# Patient Record
Sex: Male | Born: 1950 | Race: White | Hispanic: No | Marital: Married | Smoking: Never smoker
Health system: Southern US, Community
[De-identification: ages and names within clinical notes are randomized; demographics above are authoritative.]

## PROBLEM LIST (undated history)

## (undated) DIAGNOSIS — G4730 Sleep apnea, unspecified: Secondary | ICD-10-CM

## (undated) DIAGNOSIS — I10 Essential (primary) hypertension: Secondary | ICD-10-CM

## (undated) DIAGNOSIS — R0602 Shortness of breath: Secondary | ICD-10-CM

## (undated) DIAGNOSIS — M1990 Unspecified osteoarthritis, unspecified site: Secondary | ICD-10-CM

## (undated) DIAGNOSIS — Z86711 Personal history of pulmonary embolism: Secondary | ICD-10-CM

## (undated) HISTORY — PX: BACK SURGERY: SHX140

---

## 1998-06-22 ENCOUNTER — Encounter: Admission: RE | Admit: 1998-06-22 | Discharge: 1998-09-20 | Payer: Self-pay | Admitting: *Deleted

## 2000-08-02 ENCOUNTER — Emergency Department (HOSPITAL_COMMUNITY): Admission: EM | Admit: 2000-08-02 | Discharge: 2000-08-02 | Payer: Self-pay | Admitting: Emergency Medicine

## 2000-08-02 ENCOUNTER — Encounter: Payer: Self-pay | Admitting: Emergency Medicine

## 2000-08-05 ENCOUNTER — Ambulatory Visit (HOSPITAL_COMMUNITY): Admission: RE | Admit: 2000-08-05 | Discharge: 2000-08-05 | Payer: Self-pay | Admitting: Neurosurgery

## 2000-08-05 ENCOUNTER — Encounter: Payer: Self-pay | Admitting: Neurosurgery

## 2000-08-12 ENCOUNTER — Encounter: Payer: Self-pay | Admitting: Neurosurgery

## 2000-08-12 ENCOUNTER — Ambulatory Visit (HOSPITAL_COMMUNITY): Admission: RE | Admit: 2000-08-12 | Discharge: 2000-08-12 | Payer: Self-pay | Admitting: Neurosurgery

## 2001-02-02 ENCOUNTER — Ambulatory Visit (HOSPITAL_COMMUNITY): Admission: RE | Admit: 2001-02-02 | Discharge: 2001-02-02 | Payer: Self-pay | Admitting: Internal Medicine

## 2001-02-17 ENCOUNTER — Encounter (INDEPENDENT_AMBULATORY_CARE_PROVIDER_SITE_OTHER): Payer: Self-pay | Admitting: Specialist

## 2001-02-17 ENCOUNTER — Ambulatory Visit (HOSPITAL_BASED_OUTPATIENT_CLINIC_OR_DEPARTMENT_OTHER): Admission: RE | Admit: 2001-02-17 | Discharge: 2001-02-17 | Payer: Self-pay | Admitting: Orthopedic Surgery

## 2003-05-12 ENCOUNTER — Encounter: Payer: Self-pay | Admitting: Cardiology

## 2003-05-12 ENCOUNTER — Ambulatory Visit (HOSPITAL_COMMUNITY): Admission: RE | Admit: 2003-05-12 | Discharge: 2003-05-12 | Payer: Self-pay | Admitting: Cardiology

## 2003-06-02 ENCOUNTER — Ambulatory Visit (HOSPITAL_COMMUNITY): Admission: RE | Admit: 2003-06-02 | Discharge: 2003-06-03 | Payer: Self-pay | Admitting: Orthopedic Surgery

## 2003-06-29 ENCOUNTER — Ambulatory Visit (HOSPITAL_COMMUNITY): Admission: RE | Admit: 2003-06-29 | Discharge: 2003-06-29 | Payer: Self-pay | Admitting: Orthopedic Surgery

## 2003-07-19 ENCOUNTER — Encounter: Admission: RE | Admit: 2003-07-19 | Discharge: 2003-07-19 | Payer: Self-pay | Admitting: Cardiology

## 2004-05-30 ENCOUNTER — Ambulatory Visit (HOSPITAL_COMMUNITY): Admission: RE | Admit: 2004-05-30 | Discharge: 2004-05-30 | Payer: Self-pay | Admitting: Orthopedic Surgery

## 2004-05-30 ENCOUNTER — Ambulatory Visit (HOSPITAL_BASED_OUTPATIENT_CLINIC_OR_DEPARTMENT_OTHER): Admission: RE | Admit: 2004-05-30 | Discharge: 2004-05-30 | Payer: Self-pay | Admitting: Orthopedic Surgery

## 2007-04-13 ENCOUNTER — Ambulatory Visit: Payer: Self-pay | Admitting: Internal Medicine

## 2007-08-26 DIAGNOSIS — Z86711 Personal history of pulmonary embolism: Secondary | ICD-10-CM

## 2007-08-26 HISTORY — PX: COLONOSCOPY: SHX174

## 2007-08-26 HISTORY — DX: Personal history of pulmonary embolism: Z86.711

## 2008-03-24 ENCOUNTER — Ambulatory Visit: Payer: Self-pay | Admitting: Gastroenterology

## 2008-04-12 ENCOUNTER — Ambulatory Visit: Payer: Self-pay | Admitting: Gastroenterology

## 2008-04-12 ENCOUNTER — Encounter: Payer: Self-pay | Admitting: Gastroenterology

## 2008-04-12 ENCOUNTER — Ambulatory Visit: Payer: Self-pay | Admitting: Internal Medicine

## 2008-04-12 DIAGNOSIS — G4733 Obstructive sleep apnea (adult) (pediatric): Secondary | ICD-10-CM | POA: Insufficient documentation

## 2008-04-12 DIAGNOSIS — E785 Hyperlipidemia, unspecified: Secondary | ICD-10-CM | POA: Insufficient documentation

## 2008-04-12 DIAGNOSIS — E119 Type 2 diabetes mellitus without complications: Secondary | ICD-10-CM | POA: Insufficient documentation

## 2008-04-12 DIAGNOSIS — I10 Essential (primary) hypertension: Secondary | ICD-10-CM | POA: Insufficient documentation

## 2008-04-17 ENCOUNTER — Encounter: Payer: Self-pay | Admitting: Gastroenterology

## 2008-06-14 ENCOUNTER — Telehealth: Payer: Self-pay | Admitting: Internal Medicine

## 2008-07-02 ENCOUNTER — Encounter: Payer: Self-pay | Admitting: Internal Medicine

## 2008-08-25 HISTORY — PX: ELBOW ARTHROSCOPY: SUR87

## 2009-05-18 ENCOUNTER — Inpatient Hospital Stay (HOSPITAL_COMMUNITY): Admission: EM | Admit: 2009-05-18 | Discharge: 2009-05-21 | Payer: Self-pay | Admitting: Emergency Medicine

## 2010-06-12 ENCOUNTER — Ambulatory Visit: Payer: Self-pay | Admitting: Vascular Surgery

## 2010-11-29 LAB — CBC
HCT: 36 % — ABNORMAL LOW (ref 39.0–52.0)
Hemoglobin: 12.3 g/dL — ABNORMAL LOW (ref 13.0–17.0)
Hemoglobin: 14.4 g/dL (ref 13.0–17.0)
MCHC: 34.1 g/dL (ref 30.0–36.0)
MCHC: 34.2 g/dL (ref 30.0–36.0)
MCV: 88.6 fL (ref 78.0–100.0)
RDW: 13.8 % (ref 11.5–15.5)
RDW: 14 % (ref 11.5–15.5)

## 2010-11-29 LAB — BASIC METABOLIC PANEL
BUN: 9 mg/dL (ref 6–23)
Chloride: 105 mEq/L (ref 96–112)
Creatinine, Ser: 0.82 mg/dL (ref 0.4–1.5)
GFR calc Af Amer: >60 mL/min (ref >60)
GFR calc non Af Amer: >60 mL/min (ref >60)
Potassium: 3.9 mEq/L (ref 3.5–5.1)

## 2010-11-29 LAB — COMPREHENSIVE METABOLIC PANEL
ALT: 45 U/L (ref 0–53)
Calcium: 9.3 mg/dL (ref 8.4–10.5)
Creatinine, Ser: 1.05 mg/dL (ref 0.4–1.5)
Glucose, Bld: 255 mg/dL — ABNORMAL HIGH (ref 70–99)
Sodium: 138 mEq/L (ref 135–145)
Total Protein: 6.4 g/dL (ref 6.0–8.3)

## 2010-11-29 LAB — GLUCOSE, CAPILLARY
Glucose-Capillary: 178 mg/dL — ABNORMAL HIGH (ref 70–99)
Glucose-Capillary: 196 mg/dL — ABNORMAL HIGH (ref 70–99)
Glucose-Capillary: 206 mg/dL — ABNORMAL HIGH (ref 70–99)
Glucose-Capillary: 206 mg/dL — ABNORMAL HIGH (ref 70–99)
Glucose-Capillary: 206 mg/dL — ABNORMAL HIGH (ref 70–99)
Glucose-Capillary: 225 mg/dL — ABNORMAL HIGH (ref 70–99)
Glucose-Capillary: 235 mg/dL — ABNORMAL HIGH (ref 70–99)
Glucose-Capillary: 248 mg/dL — ABNORMAL HIGH (ref 70–99)
Glucose-Capillary: 253 mg/dL — ABNORMAL HIGH (ref 70–99)

## 2010-11-29 LAB — DIFFERENTIAL
Lymphocytes Relative: 5 % — ABNORMAL LOW (ref 12–46)
Lymphs Abs: 0.8 10*3/uL (ref 0.7–4.0)
Monocytes Relative: 4 % (ref 3–12)
Neutro Abs: 14 10*3/uL — ABNORMAL HIGH (ref 1.7–7.7)
Neutrophils Relative %: 90 % — ABNORMAL HIGH (ref 43–77)

## 2010-11-29 LAB — SAMPLE TO BLOOD BANK

## 2010-11-29 LAB — PROTIME-INR
INR: 1 (ref 0.00–1.49)
Prothrombin Time: 13.3 seconds (ref 11.6–15.2)

## 2010-11-29 LAB — APTT: aPTT: 21 seconds — ABNORMAL LOW (ref 24–37)

## 2011-01-07 NOTE — Assessment & Plan Note (Signed)
[facility]                             PULMONARY OFFICE NOTE   NAME:Anthony Case, Anthony Case                   MRN:          [id number]  DATE:04/13/2007                            DOB:          [birth year]    PROBLEM:  A 60 year old man with sleep apnea.   HISTORY:  This gentleman was diagnosed by me with sleep apnea over 15  years ago.  He had been using CPAP with his original machine set at 7 or  7.5 CWP, and felt he had done well with this.  He had used a nasal mask  and no humidifier.  That machine finally failed, and he has used a  Industrial/product designer with auto titration from [facility] pending this  visit to reestablish.  He is now using a nasal pillows mask.  Weight has  gone up some in the last year.  He has not been told that he snores  through his CPAP, and he recognizes significant sleepiness only if he  tries to skip it.   MEDICATIONS:  1. Glipizide 5 mg.  2. Actos 30 mg.  3. Diovan 80 mg.  4. Lipitor 10 mg.  5. Metformin 500 mg.  6. Aspirin 325 mg.  7. CPAP.   No medication allergy.   REVIEW OF SYSTEMS:  Weight gain.  He denies significant sleepiness,  headache, palpitation, shortness of breath, nasal congestion or unusual  behavior associated with sleep.   PAST HISTORY:  1. Hypertension.  2. Tonsillectomy.  3. Diabetes.  4. Sleep apnea.  5. No history of heart or lung disease.   SOCIAL HISTORY:  Married with children, never smoked, 2-3 glasses of  wine.  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   FAMILY HISTORY:  Heart disease.   OBJECTIVE:  Weight 341 pounds, BP 112/60, pulse 80, room air saturation  94%.  He is an overweight, alert, tall gentleman who appears comfortable.  HEENT:  Palate spacing 3-4/4.  Voice quality is normal, there is no  stridor.  Nasal airway is unobstructed.  There is no neck vein distension or thyromegaly.  CHEST:  Quiet, clear.  LUNG FIELDS:   Unlabored breathing.  Heart sounds regular without murmur or gallop.  EXTREMITIES:  Without cyanosis, clubbing, tremor, or edema.  NEUROLOGIC:  Unremarkable to observation.   IMPRESSION:  Obstructive sleep apnea with remote diagnosis,  symptomatically well controlled in the past, but now with some question  as he has gained weight and his old machine has failed.   PLAN:  Auto titration download was done with suggested pressure 10 CWP.  [facility] is going to change his pressure to 10 CWP, and we will  schedule return in a month, earlier p.r.n.  The basics of sleep hygiene  and management of sleep apnea were reviewed today.     Clinton D. Maple Hudson, MD, Tonny Bollman, FACP  Electronically Signed    CDY/MedQ  DD: 04/13/2007  DT: 04/14/2007  Job #: [id number]   cc:   Barry Dienes. Eloise Harman, M.D.

## 2011-01-07 NOTE — Procedures (Signed)
DUPLEX DEEP VENOUS EXAM - LOWER EXTREMITY   INDICATION:  Left lower extremity pain.   HISTORY:  Edema:  Bilateral lower extremities.  Trauma/Surgery:  Bee sting in September of 2011 at the left lower leg  region.  Pain:  Left lower leg region.  PE:  Yes, 2 years ago.  Previous DVT:  The patient states he had a left calf DVT 2 years ago.  Anticoagulants:  Other:   DUPLEX EXAM:                CFV   SFV   PopV  PTV    GSV                R  L  R  L  R  L  R   L  R  L  Thrombosis    o  o     o     o      +     O  Spontaneous   +  +     +     +      o     +  Phasic        +  +     +     +      0     +  Augmentation  +  +     +     +      o     +  Compressible  +  +     +     +      o     +  Competent   Legend:  + - yes  o - no  p - partial  D - decreased   IMPRESSION:  1. No evidence of recent deep or superficial vein thrombosis noted in      the left lower extremity.  2. 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    _____________________________  Quita Skye Hart Rochester, M.D.   CH/MEDQ  D:  06/14/2010  T:  06/14/2010  Job:  [id number]

## 2011-01-10 NOTE — Op Note (Signed)
[facility]  Patient:    Anthony Case, Anthony Case                     MRN: [id number] Proc. Date: 02/17/01 Attending:  Nicki Reaper, M.D.                           Operative Report  PREOPERATIVE DIAGNOSIS:  Mucoid cyst, left middle finger.  POSTOPERATIVE DIAGNOSIS:  Mucoid cyst, left middle finger.  OPERATION:  Excision of cyst, debridement of distal interphalangeal joint, left middle finger.  SURGEON:  Nicki Reaper, M.D.  ASSISTANT:  R.N.  ANESTHESIA:  Forearm-based IV regional.  ANESTHESIOLOGIST:  Janetta Hora. Gelene Mink, M.D.  HISTORY:  The patient is a 60 year old male with a history of a mass deformity of his nail of the left middle finger.  X-rays revealed arthritic changes of distal interphalangeal joint.  DESCRIPTION OF PROCEDURE:  The patient was brought to the operating room where a forearm-based IV regional anesthetic was carried out without difficulty.  He was prepped and draped using Betadine scrubbing solution with the left free. A curvilinear incision was made at the level of the distal interphalangeal joint and carried down through subcutaneous tissue.  Bleeders were electrocauterized.  The cyst was excised through a separate incision transversely, just proximal to the distal nail fold and carried down to the cyst which was immediately encountered.  With blunt and sharp dissection, this was dissected free and sent to pathology.  This area was then connected under the skin proximally.  The joint was then debrided with a small rongeur on the radial and ulnar _________.  The wounds were irrigated and the skin was closed with interrupted 5-0 nylon sutures.  A sterile compressive dressing with splint was applied to the finger.  The patient tolerated the procedure well and was taken to the recovery room for observation in satisfactory condition.  He is discharged home to return to the [facility] in one week on Vicodin and  Keflex. DD:  02/17/01 TD:  02/17/01 Job: [id number]

## 2011-01-10 NOTE — Op Note (Signed)
NAMEBEREKET, GERNERT            ACCOUNT NO.:  [id number]   MEDICAL RECORD NO.:  [id number]          PATIENT TYPE:  AMB   LOCATION:  DSC                          FACILITY:  [facility]   PHYSICIAN:  Rodney A. Mortenson, M.D.DATE OF BIRTH:  09-Sep-1950   DATE OF PROCEDURE:  05/30/2004  DATE OF DISCHARGE:                                 OPERATIVE REPORT   PREOPERATIVE DIAGNOSIS:  Painful olecranon screw and wire, right elbow,  status post fracture of right olecranon.   POSTOPERATIVE DIAGNOSIS:  Painful olecranon screw and wire, right elbow,  status post fracture of right olecranon.   OPERATION PERFORMED:  Removal of olecranon screw and wire from right elbow.   SURGEON:  Lenard Galloway. Chaney Malling, M.D.   ANESTHESIA:  General.   DESCRIPTION OF PROCEDURE:  The patient was placed on the operating table in  the supine position with pneumatic tourniquet about the right upper arm.  After satisfactory general anesthesia, the right upper extremity was prepped  with DuraPrep and draped out in the usual manner.  The arm was then wrapped  out with an Esmarch and the tourniquet was elevated.  Incision was made over  the tip of the olecranon.  Skin edges were retracted.  Dissection was  carried down to the large olecranon screw.  The olecranon screw was then  backed out.  There was a wire in the area.  This was cut with wire cutters  and grabbed with a large pair of pliers and the wire was removed.  All of  the metal was taken out of the olecranon.  The fracture was stable.  The  skin edges were then closed with stainless steel staples and Marcaine was  placed about the wound.  Sterile dressings were applied.  The patient was  then transferred to the recovery room in excellent condition.  Technically  this procedure went extremely well.   FOLLOW UP:  1.  Percocet for pain.  2.  To my office next Wednesday.       RAM/MEDQ  D:  05/30/2004  T:  05/30/2004  Job:  [id number]

## 2011-01-10 NOTE — H&P (Signed)
NAME:  Anthony Case, Anthony Case NO.:  [id number]   MEDICAL RECORD NO.:  [id number]                   PATIENT TYPE:  OIB   LOCATION:                                       FACILITY:  [facility]   PHYSICIAN:  Colleen Can. Deborah Chalk, M.D.            DATE OF BIRTH:  04-Oct-1950   DATE OF ADMISSION:  05/12/2003  DATE OF DISCHARGE:                                HISTORY & PHYSICAL   CHIEF COMPLAINT:  Chest pain.   HISTORY OF PRESENT ILLNESS:  The patient is a 60 year old, obese, white male  who has multiple medical problems.  He presents with atypical chest pain  that has been occurring over the past couple of weeks.  His first episode  occurred while pushing his wife in a wheelchair at [employer].  He was short  of breath and had a sensation of chest pressure.  Several days later, he was  short of breath while he was driving.  He has continued to have intermittent  bouts of chest discomfort that has been somewhat atypical in nature.  In  light of his multiple cardiovascular risk factors, he is subsequently  referred for elective cardiac catheterization.  He has previously been  placed on Plavix and beta blocker therapy.   PAST MEDICAL HISTORY:  1. Morbid obesity.  2. Diabetes type 2 diagnosed in February of 1999.  3. Erectile dysfunction, organic.  4. Alcohol dependence.  5. Diabetic retinopathy.  6. History of dysphagia.  7. Sleep apnea.  8. Hyperlipidemia.  9. Previous neck injury dating back to November of 2001.   ALLERGIES:  None.   INTOLERANCES:  ALTACE causes a cough.   CURRENT MEDICATIONS:  1. Actos 30 mg a day.  2. Diovan 80 mg a day.  3. Glucophage 500 mg daily.  4. Lipitor 10 mg a day.  5. Aspirin daily.  6. Viagra p.r.n.  7. Plavix 75 mg a day.  8. Toprol 50 mg a day.   FAMILY HISTORY:  His father died at 44 with heart attack and had tobacco  use.  His mother died at 46 with coronary disease as well.  There is an  early incidence of coronary  disease, diabetes, as well as colon cancer among  his family.   SOCIAL HISTORY:  There is a report of alcohol dependency.  There is no  current tobacco.   REVIEW OF SYSTEMS:  Basically as noted above and is otherwise unremarkable.   PHYSICAL EXAMINATION:  WEIGHT:  304 pounds.  VITAL SIGNS:  Blood pressure 130/80 sitting and 120/80 standing, heart rate  68.  LUNGS:  Clear.  HEART:  Regular rhythm though heart tones are somewhat distant.  ABDOMEN:  Morbidly obese.  EXTREMITIES:  Full.  Distal pulses are intact.  NEUROLOGIC:  Intact.   LABORATORY DATA:  Pending.   OVERALL IMPRESSION:  1. Recurrent bouts of chest pain of uncertain etiology.  2. Multiple cardiovascular risk factors (gender,  diabetes, hypertension,     hyperlipidemia, and strong positive family history).  3. Obesity.  4. History of erectile dysfunction.  5. Type 2 diabetes.  6. Hyperlipidemia.   PLAN:  Will proceed on with elective cardiac catheterization.  The procedure  has been discussed in full detail and he is willing to proceed on Friday,  May 12, 2003.      Juanell Fairly C. Earl Gala, N.P.                 Colleen Can. Deborah Chalk, M.D.    LCO/MEDQ  D:  05/09/2003  T:  05/09/2003  Job:  [id number]   cc:   Barry Dienes. Eloise Harman, M.D.  [address]  [place]  [place] [address]  Fax: [phone]

## 2011-01-10 NOTE — Op Note (Signed)
NAME:  Anthony Case, Anthony Case                      ACCOUNT NO.:  [id number]   MEDICAL RECORD NO.:  [id number]                   PATIENT TYPE:  OIB   LOCATION:  5016                                 FACILITY:  [facility]   PHYSICIAN:  Lenard Galloway. Chaney Malling, M.D.           DATE OF BIRTH:  11-01-1950   DATE OF PROCEDURE:  06/02/2003  DATE OF DISCHARGE:                                 OPERATIVE REPORT   PREOPERATIVE DIAGNOSES:  Displaced fracture of right olecranon.   POSTOPERATIVE DIAGNOSES:  Displaced fracture of right olecranon.   OPERATION PERFORMED:  Open reduction internal fixation using 6.2 mm  cannulated screw and figure-of-eight wire.   SURGEON:  Lenard Galloway. Chaney Malling, M.D.   ASSISTANT:  Legrand Pitts. Duffy, P.A.   ANESTHESIA:  General.   DESCRIPTION OF PROCEDURE:  The patient was placed on the operating table in  the supine position.  After satisfactory nasal endotracheal anesthesia, a  pneumatic tourniquet was placed about the right upper arm.  The right upper  extremity was prepped with DuraPrep and draped out in the usual manner.  The  arm was then wrapped out with an Esmarch.  The tourniquet was then elevated.  A lazy-S incision was made over the posterior aspect of the patella exposing  the triceps insertion on the olecranon and the fracture of the olecranon.  Numerous fragments of bone were removed preventing them falling in the  joint.  Soft tissue stripped off the fracture site so it could be reached  anatomically.  Once this was achieved, a large guide pin was placed through  the olecranon into the proximal shaft of the ulna.  This was all done under  radiographic control.  The guide pin was felt to be in excellent position.  A 90 mm 6.2 mm diameter cannulated screw with a washer was passed over the  guide pin and passed across the fracture site. The figure-of-eight wire was  then used.  A drill hole was placed in the olecranon distal to the fracture  site and wire was passed  through the wire and brought proximally in figure-  of-eight fashion around the shaft of the screw behind the washer.  The screw  was then advanced so the fracture was reduced in anatomic position.  The  figure-of-eight wire was then tightened using traction bow.  Excess wire was  cut off.  Bone which had been harvested previously was then used to fill in  the defect over the posterior aspect of the olecranon.  The deep fascia was  then closed with a 0 Vicryl.  2-0 Vicryl was used to close subcutaneous  tissue.  Stainless steel staples were used to close the skin.  A large  sterile dressing and posterior plaster splint was applied and the patient  returned to recovery room in excellent condition.  Technically this  procedure went extremely well.   DRAINS:  None.   COMPLICATIONS:  None.  Rodney A. Chaney Malling, M.D.    RAM/MEDQ  D:  06/02/2003  T:  06/03/2003  Job:  [id number]

## 2011-01-10 NOTE — Op Note (Signed)
   NAME:  Anthony Case, Anthony Case                      ACCOUNT NO.:  [id number]   MEDICAL RECORD NO.:  [id number]                   PATIENT TYPE:  OIB   LOCATION:  2899                                 FACILITY:  [facility]   PHYSICIAN:  Lenard Galloway. Chaney Malling, M.D.           DATE OF BIRTH:  13-Apr-1951   DATE OF PROCEDURE:  06/29/2003  DATE OF DISCHARGE:  06/29/2003                                 OPERATIVE REPORT   PREOPERATIVE DIAGNOSIS:  Possible abscess of the right elbow following  previous open reduction and internal fixation of a fracture of the left  olecranon.   POSTOPERATIVE DIAGNOSIS:  Necrotic fat tissue of the right elbow.   PROCEDURE:  Incision and drainage of the right elbow.   SURGEON:  Lenard Galloway. Chaney Malling, M.D.   ANESTHESIA:  General.   DESCRIPTION OF PROCEDURE:  The patient was placed on the operating table in  the supine position.  After satisfactory anesthesia, a pneumatic tourniquet  was placed about the right upper arm.  The right upper extremity was prepped  with DuraPrep and then draped out in the usual manner.  The arm was wrapped  out with an Esmarch and the tourniquet elevated.   The proximal wound had some slight serous drainage.  This was opened up and  extended proximally with the knife.  The subcutaneous tissue was opened.  Blunt hemostats were then used to dissect down to the triceps.  There was  necrotic fat tissue in this area.  No stitches could be seen.  The triceps  muscle fascia and the muscle belly itself appeared absolutely normal.  The  screw into the olecranon could be seen.  The area around the screw was  clean.   Finger dissection was used to break up the subcutaneous tissue and open this  area up.  Once this was completed, pulsating lavage was used and 3000 mL of  fluid was used to irrigate the proximal wound.  No abscesses or pockets were  found.  A Penrose drain was placed and sutured to the skin.  Two large #2  nylon sutures were used to  partially close the wound and a retention suture  was placed in the mid portion of the wound and left untied, to be tied at a  later time.  A large bulky compression dressing was applied.  The tourniquet  was dropped.   The patient returned to the recovery room in excellent condition.  The  procedure went extremely well.   DRAINS:  Penrose.   COMPLICATIONS:  None.                                               Rodney A. Chaney Malling, M.D.    RAM/MEDQ  D:  06/29/2003  T:  06/30/2003  Job:  [id number]

## 2012-01-22 ENCOUNTER — Ambulatory Visit (INDEPENDENT_AMBULATORY_CARE_PROVIDER_SITE_OTHER): Payer: 59 | Admitting: Family Medicine

## 2012-01-22 VITALS — BP 143/78 | HR 60 | Temp 98.6°F | Resp 16 | Ht 72.0 in | Wt 302.4 lb

## 2012-01-22 DIAGNOSIS — M26629 Arthralgia of temporomandibular joint, unspecified side: Secondary | ICD-10-CM

## 2012-01-22 DIAGNOSIS — M2669 Other specified disorders of temporomandibular joint: Secondary | ICD-10-CM

## 2012-01-22 MED ORDER — CYCLOBENZAPRINE HCL 5 MG PO TABS: 5.0000 mg | ORAL_TABLET | Freq: Three times a day (TID) | ORAL | Status: AC | PRN

## 2012-01-22 NOTE — Progress Notes (Signed)
  Subjective:    Patient ID: FINDLEY VI, male    DOB: Jan 03, 1951, 61 y.o.   MRN: [id number]  HPI    Review of Systems     Objective:   Physical Exam        Assessment & Plan:  Reviewed.  Agree with assessment and plan.

## 2012-01-22 NOTE — Patient Instructions (Signed)
Temporomandibular Problems   Temporomandibular joint (TMJ) dysfunction means there are problems with the joint between your jaw and your skull. This is a joint lined by cartilage like other joints in your body but also has a small disc in the joint which keeps the bones from rubbing on each other. These joints are like other joints and can get inflamed (sore) from arthritis and other problems. When this joint gets sore, it can cause headaches and pain in the jaw and the face.   CAUSES   Usually the arthritic types of problems are caused by soreness in the joint. Soreness in the joint can also be caused by overuse. This may come from grinding your teeth. It may also come from mis-alignment in the joint.   DIAGNOSIS   Diagnosis of this condition can often be made by history and exam. Sometimes your caregiver may need X-rays or an MRI scan to determine the exact cause. It may be necessary to see your dentist to determine if your teeth and jaws are lined up correctly.   TREATMENT   Most of the time this problem is not serious; however, sometimes it can persist (become chronic). When this happens medications that will cut down on inflammation (soreness) help. Sometimes a shot of cortisone into the joint will be helpful. If your teeth are not aligned it may help for your dentist to make a splint for your mouth that can help this problem. If no physical problems can be found, the problem may come from tension. If tension is found to be the cause, biofeedback or relaxation techniques may be helpful.   HOME CARE INSTRUCTIONS   Later in the day, applications of ice packs may be helpful. Ice can be used in a plastic bag with a towel around it to prevent frostbite to skin. This may be used about every 2 hours for 20 to 30 minutes, as needed while awake, or as directed by your caregiver.   Only take over-the-counter or prescription medicines for pain, discomfort, or fever as directed by your caregiver.   If physical therapy was  prescribed, follow your caregiver's directions.   Wear mouth appliances as directed if they were given.   Document Released: 05/06/2001 Document Revised: 07/31/2011 Document Reviewed: 08/13/2008   Exit[employer]® Patient Information ©2012 [employer].

## 2012-01-22 NOTE — Progress Notes (Signed)
HPI:  Jaw pain x 1 day Woke up this am with significant L sided jaw pain. Pain worse with biting and grinding.  No fevers or chills.  Diabetes has been well controlled.  No hx/o dental caries.  No temporal pain.  No headache/vision changes.  Pain most predominant at jaw hinge point per pt.   Patient Active Problem List  Diagnoses  . DIABETES, TYPE 2  . HYPERLIPIDEMIA  . HYPERTENSION  . SLEEP APNEA   Past Medical History: No past medical history on file.  Past Surgical History: No past surgical history on file.  Social History: History   Social History  . Marital Status: Married    Spouse Name: N/A    Number of Children: N/A  . Years of Education: N/A   Social History Main Topics  . Smoking status: Never Smoker   . Smokeless tobacco: Not on file  . Alcohol Use: Not on file  . Drug Use: Not on file  . Sexually Active: Not on file   Other Topics Concern  . Not on file   Social History Narrative  . No narrative on file    Family History: No family history on file.  Allergies: No Known Allergies  Current Outpatient Prescriptions  Medication Sig Dispense Refill  . aspirin 81 MG tablet Take 81 mg by mouth daily.      Marland Kitchen diltiazem (CARDIZEM) 60 MG tablet Take 60 mg by mouth 4 (four) times daily.      Marland Kitchen glipiZIDE (GLUCOTROL) 5 MG tablet Take 5 mg by mouth 2 (two) times daily before a meal.      . insulin detemir (LEVEMIR) 100 UNIT/ML injection Inject 50 Units into the skin daily.      Marland Kitchen lovastatin (MEVACOR) 20 MG tablet Take 20 mg by mouth at bedtime.      . metFORMIN (GLUCOPHAGE) 500 MG tablet Take 500 mg by mouth 2 (two) times daily with a meal.      . cyclobenzaprine (FLEXERIL) 5 MG tablet Take 1 tablet (5 mg total) by mouth 3 (three) times daily as needed for muscle spasms.  30 tablet  0   Review Of Systems: negative except as noted above in HPI.   Physical Exam: Filed Vitals:   01/22/12 1634  BP: 143/78  Pulse: 60  Temp: 98.6 F (37 C)  Resp: 16    General: alert, cooperative and moderately obese HEENT: PERRLA, extra ocular movement intact and + TTP at TMJ,good dentition, no temporal artery tenderness.  Heart: S1, S2 normal, no murmur, rub or gallop, regular rate and rhythm Lungs: clear to auscultation, no wheezes or rales and unlabored breathing Abdomen: abdomen is soft without significant tenderness, masses, organomegaly or guarding Extremities: extremities normal, atraumatic, no cyanosis or edema Skin:no rashes Neurology: normal without focal findings  Assessment and Plan:  TMJ syndrome. NSIADs + muscle relaxants. Handout given. Discussed infectious red flags. Follow up as needed.     The patient and/or caregiver has been counseled thoroughly with regard to treatment plan and/or medications prescribed including dosage, schedule, interactions, rationale for use, and possible side effects and they verbalize understanding. Diagnoses and expected course of recovery discussed and will return if not improved as expected or if the condition worsens. Patient and/or caregiver verbalized understanding.

## 2012-04-07 ENCOUNTER — Encounter (HOSPITAL_COMMUNITY): Payer: Self-pay | Admitting: Internal Medicine

## 2012-04-07 ENCOUNTER — Inpatient Hospital Stay (HOSPITAL_COMMUNITY)
Admission: AD | Admit: 2012-04-07 | Discharge: 2012-04-09 | DRG: 176 | Disposition: A | Discharge status: Home / Self Care | Payer: 59 | Source: Ambulatory Visit | Attending: Internal Medicine | Admitting: Internal Medicine

## 2012-04-07 DIAGNOSIS — R0600 Dyspnea, unspecified: Secondary | ICD-10-CM | POA: Diagnosis present

## 2012-04-07 DIAGNOSIS — I82409 Acute embolism and thrombosis of unspecified deep veins of unspecified lower extremity: Secondary | ICD-10-CM | POA: Diagnosis present

## 2012-04-07 DIAGNOSIS — I10 Essential (primary) hypertension: Secondary | ICD-10-CM | POA: Diagnosis present

## 2012-04-07 DIAGNOSIS — I2699 Other pulmonary embolism without acute cor pulmonale: Principal | ICD-10-CM | POA: Diagnosis present

## 2012-04-07 DIAGNOSIS — E119 Type 2 diabetes mellitus without complications: Secondary | ICD-10-CM | POA: Diagnosis present

## 2012-04-07 DIAGNOSIS — R600 Localized edema: Secondary | ICD-10-CM | POA: Diagnosis present

## 2012-04-07 DIAGNOSIS — E785 Hyperlipidemia, unspecified: Secondary | ICD-10-CM

## 2012-04-07 DIAGNOSIS — R0609 Other forms of dyspnea: Secondary | ICD-10-CM | POA: Insufficient documentation

## 2012-04-07 DIAGNOSIS — G4733 Obstructive sleep apnea (adult) (pediatric): Secondary | ICD-10-CM | POA: Diagnosis present

## 2012-04-07 DIAGNOSIS — G4730 Sleep apnea, unspecified: Secondary | ICD-10-CM

## 2012-04-07 DIAGNOSIS — Z86711 Personal history of pulmonary embolism: Secondary | ICD-10-CM

## 2012-04-07 DIAGNOSIS — I82402 Acute embolism and thrombosis of unspecified deep veins of left lower extremity: Secondary | ICD-10-CM | POA: Diagnosis present

## 2012-04-07 HISTORY — DX: Essential (primary) hypertension: I10

## 2012-04-07 HISTORY — DX: Shortness of breath: R06.02

## 2012-04-07 HISTORY — DX: Personal history of pulmonary embolism: Z86.711

## 2012-04-07 HISTORY — DX: Unspecified osteoarthritis, unspecified site: M19.90

## 2012-04-07 HISTORY — DX: Sleep apnea, unspecified: G47.30

## 2012-04-07 LAB — BASIC METABOLIC PANEL
BUN: 21 mg/dL (ref 6–23)
CO2: 23 mEq/L (ref 19–32)
Chloride: 103 mEq/L (ref 96–112)
Creatinine, Ser: 0.94 mg/dL (ref 0.50–1.35)
Glucose, Bld: 321 mg/dL — ABNORMAL HIGH (ref 70–99)

## 2012-04-07 LAB — CBC
HCT: 40.9 % (ref 39.0–52.0)
MCH: 31.3 pg (ref 26.0–34.0)
MCHC: 35.5 g/dL (ref 30.0–36.0)
MCV: 88.3 fL (ref 78.0–100.0)
RDW: 13 % (ref 11.5–15.5)

## 2012-04-07 LAB — GLUCOSE, CAPILLARY: Glucose-Capillary: 293 mg/dL — ABNORMAL HIGH (ref 70–99)

## 2012-04-07 LAB — PROTIME-INR
INR: 1.04 (ref 0.00–1.49)
Prothrombin Time: 13.8 seconds (ref 11.6–15.2)

## 2012-04-07 MED ORDER — INSULIN ASPART 100 UNIT/ML ~~LOC~~ SOLN
0.0000 [IU] | Freq: Three times a day (TID) | SUBCUTANEOUS | Status: DC
  Administered 2012-04-08: 12:00:00 via SUBCUTANEOUS
  Administered 2012-04-08 – 2012-04-09 (×2): 5 [IU] via SUBCUTANEOUS

## 2012-04-07 MED ORDER — ASPIRIN EC 81 MG PO TBEC
81.0000 mg | DELAYED_RELEASE_TABLET | Freq: Every day | ORAL | Status: DC
  Administered 2012-04-08 – 2012-04-09 (×2): 81 mg via ORAL
  Filled 2012-04-07 (×2): qty 1

## 2012-04-07 MED ORDER — HEPARIN BOLUS VIA INFUSION
3500.0000 [IU] | Freq: Once | INTRAVENOUS | Status: AC
Start: 2012-04-07 — End: 2012-04-08
  Administered 2012-04-08: 3500 [IU] via INTRAVENOUS
  Filled 2012-04-07: qty 3500

## 2012-04-07 MED ORDER — GLIPIZIDE 5 MG PO TABS
5.0000 mg | ORAL_TABLET | Freq: Two times a day (BID) | ORAL | Status: DC
  Administered 2012-04-08 – 2012-04-09 (×3): 5 mg via ORAL
  Filled 2012-04-07 (×6): qty 1

## 2012-04-07 MED ORDER — SENNOSIDES-DOCUSATE SODIUM 8.6-50 MG PO TABS: 1.0000 | ORAL_TABLET | Freq: Every evening | ORAL | Status: DC | PRN

## 2012-04-07 MED ORDER — PROMETHAZINE HCL 25 MG PO TABS: 12.5000 mg | ORAL_TABLET | Freq: Four times a day (QID) | ORAL | Status: DC | PRN

## 2012-04-07 MED ORDER — INSULIN DETEMIR 100 UNIT/ML ~~LOC~~ SOLN
50.0000 [IU] | Freq: Every day | SUBCUTANEOUS | Status: DC
  Administered 2012-04-07 – 2012-04-08 (×2): 50 [IU] via SUBCUTANEOUS
  Filled 2012-04-07: qty 10

## 2012-04-07 MED ORDER — SIMVASTATIN 20 MG PO TABS
20.0000 mg | ORAL_TABLET | Freq: Every day | ORAL | Status: DC
  Filled 2012-04-07: qty 1

## 2012-04-07 MED ORDER — INSULIN ASPART 100 UNIT/ML ~~LOC~~ SOLN
0.0000 [IU] | Freq: Every day | SUBCUTANEOUS | Status: DC
  Administered 2012-04-07: 3 [IU] via SUBCUTANEOUS
  Administered 2012-04-08: 2 [IU] via SUBCUTANEOUS

## 2012-04-07 MED ORDER — INSULIN ASPART 100 UNIT/ML ~~LOC~~ SOLN
4.0000 [IU] | Freq: Three times a day (TID) | SUBCUTANEOUS | Status: DC
  Administered 2012-04-08: 12:00:00 via SUBCUTANEOUS
  Administered 2012-04-08 – 2012-04-09 (×2): 4 [IU] via SUBCUTANEOUS

## 2012-04-07 MED ORDER — ALUM & MAG HYDROXIDE-SIMETH 200-200-20 MG/5ML PO SUSP: 30.0000 mL | Freq: Four times a day (QID) | ORAL | Status: DC | PRN

## 2012-04-07 MED ORDER — HYDROCODONE-ACETAMINOPHEN 5-325 MG PO TABS: 1.0000 | ORAL_TABLET | ORAL | Status: DC | PRN

## 2012-04-07 MED ORDER — ACETAMINOPHEN 325 MG PO TABS: 650.0000 mg | ORAL_TABLET | Freq: Four times a day (QID) | ORAL | Status: DC | PRN

## 2012-04-07 MED ORDER — ZOLPIDEM TARTRATE 5 MG PO TABS: 5.0000 mg | ORAL_TABLET | Freq: Every evening | ORAL | Status: DC | PRN

## 2012-04-07 MED ORDER — INSULIN GLARGINE 100 UNIT/ML ~~LOC~~ SOLN: 50.0000 [IU] | Freq: Every day | SUBCUTANEOUS | Status: DC

## 2012-04-07 MED ORDER — ACETAMINOPHEN 650 MG RE SUPP: 650.0000 mg | Freq: Four times a day (QID) | RECTAL | Status: DC | PRN

## 2012-04-07 MED ORDER — SODIUM CHLORIDE 0.9 % IV SOLN
INTRAVENOUS | Status: DC
  Administered 2012-04-07: 23:00:00 via INTRAVENOUS

## 2012-04-07 MED ORDER — HEPARIN (PORCINE) IN NACL 100-0.45 UNIT/ML-% IJ SOLN
1950.0000 [IU]/h | INTRAMUSCULAR | Status: DC
  Administered 2012-04-08: 1950 [IU]/h via INTRAVENOUS
  Filled 2012-04-07 (×3): qty 250

## 2012-04-07 MED ORDER — DILTIAZEM HCL 60 MG PO TABS
60.0000 mg | ORAL_TABLET | Freq: Four times a day (QID) | ORAL | Status: DC
  Administered 2012-04-07 – 2012-04-09 (×5): 60 mg via ORAL
  Filled 2012-04-07 (×9): qty 1

## 2012-04-07 MED ORDER — SODIUM CHLORIDE 0.9 % IJ SOLN
3.0000 mL | Freq: Two times a day (BID) | INTRAMUSCULAR | Status: DC
  Administered 2012-04-07 – 2012-04-09 (×4): 3 mL via INTRAVENOUS

## 2012-04-07 MED ORDER — ASPIRIN 81 MG PO TABS
81.0000 mg | ORAL_TABLET | Freq: Every day | ORAL | Status: DC
Start: 2012-04-07 — End: 2012-04-07

## 2012-04-07 NOTE — Progress Notes (Signed)
ANTICOAGULATION CONSULT NOTE - Initial Consult  Pharmacy Consult for Heparin Indication: r/o pulmonary embolus  Allergies  Allergen Reactions  . Lisinopril Cough    Patient Measurements:   Height 74"  Weight 296 lb. Heparin Dosing Weight: 112 kg  Vital Signs: Temp: 98.1 F (36.7 C) (08/14 1904) Temp src: Oral (08/14 1904) BP: 155/77 mmHg (08/14 1904) Pulse Rate: 85  (08/14 1904)  Labs: No results found for this basename: HGB:2,HCT:3,PLT:3,APTT:3,LABPROT:3,INR:3,HEPARINUNFRC:3,CREATININE:3,CKTOTAL:3,CKMB:3,TROPONINI:3 in the last 72 hours  The CrCl is unknown because both a height and weight (above a minimum accepted value) are required for this calculation. Baseline labs are pending  Medical History: Past Medical History  Diagnosis Date  . Hypertension   . Shortness of breath   . Sleep apnea   . Diabetes mellitus   . Arthritis   . Hx pulmonary embolism 2009    required emergent tPA treatment    Medications:  Prescriptions prior to admission  Medication Sig Dispense Refill  . aspirin 325 MG EC tablet Take 650 mg by mouth daily.      Marland Kitchen diltiazem (CARDIZEM) 60 MG tablet Take 120 mg by mouth every morning.      Marland Kitchen glipiZIDE (GLUCOTROL) 5 MG tablet Take 5 mg by mouth every morning.      . insulin detemir (LEVEMIR) 100 UNIT/ML injection Inject 50 Units into the skin at bedtime.      . lovastatin (MEVACOR) 20 MG tablet Take 20 mg by mouth every morning.      . metFORMIN (GLUCOPHAGE) 500 MG tablet Take 1,000 mg by mouth daily with breakfast.        Assessment: Rule-out PE:  Patient admitted with dyspnea on exertion and left lower extremity swelling in the setting of a prior PE in 2009.  He will begin anticoagulation with Heparin for r/o PE.  I did not identify any contraindications to anticoagulation during my conversation with him. He is interested in using Xarelto if he indeed has a PE.  Goal of Therapy:  Heparin level 0.3-0.7 units/ml Monitor platelets by  anticoagulation protocol: Yes   Plan:  Heparin 3500 units IV bolus x 1 Heparin infusion at 1950 units/hr (per [place] nomogram) Check Heparin level and CBC in 6 hours and daily while on heparin Will check baseline PTT and PT/INR   Estella Husk, Pharm.D., BCPS Clinical Pharmacist  Phone [phone] Pager [phone] 04/07/2012, 8:28 PM

## 2012-04-07 NOTE — H&P (Signed)
CHANOCH MCCLEERY is an 61 y.o. male.   Chief Complaint: difficulty breathing HPI:  The patient is A 61 year old Caucasian man who is well known to me.  He presented to our office today for evaluation of acute difficulty breathing.  For the past few days he's had increasing edema of the left leg.  Today he has had progressive dyspnea on exertion, now present with walking at at normal speed.  This is unusual for him.  He has not had associated fever, chills, productive cough, substernal chest discomfort, or palpitations.  His medical history is most significant for a 2009 acute pulmonary embolism and left lower extremity DVTthat occurred after an airplane ride and was severe enough to require TPA treatment.  He was treated with Coumadin for possibly 6 months and had not had recurrence.  His medical history is otherwise significant for diabetes mellitus, type II which has not been under good control and is complicated by proteinuria, A history of alcohol dependence, diabetic retinopathy, dysphagia, and 1992 diagnosis of obstructive sleep apnea for which he uses CPAP at 10 cm, and exogenous obesity. He has no history of coronary artery disease and in 2004 he had a cardiac catheterization that did not show obstructive coronary artery disease.  Past Medical History  Diagnosis Date  . Hypertension   . Shortness of breath   . Sleep apnea   . Diabetes mellitus   . Arthritis   . Hx pulmonary embolism 2009    required emergent tPA treatment    Medications Prior to Admission  Medication Sig Dispense Refill  . aspirin 325 MG EC tablet Take 650 mg by mouth daily.      Marland Kitchen diltiazem (CARDIZEM) 60 MG tablet Take 120 mg by mouth every morning.      Marland Kitchen glipiZIDE (GLUCOTROL) 5 MG tablet Take 5 mg by mouth every morning.      . insulin detemir (LEVEMIR) 100 UNIT/ML injection Inject 50 Units into the skin at bedtime.      . lovastatin (MEVACOR) 20 MG tablet Take 20 mg by mouth every morning.      . metFORMIN  (GLUCOPHAGE) 500 MG tablet Take 1,000 mg by mouth daily with breakfast.        ADDITIONAL HOME MEDICATIONS: medication reconciliation is correct  PHYSICIANS INVOLVED IN CARE: Jarome Matin (PCP), Jetty Duhamel (pulmonary), Roger Shelter (cardiology), Dr. Darel Hong (oph), Sheryn Bison (GI)  Past Surgical History  Procedure Date  . Back surgery   . Elbow arthroscopy 2010  . Colonoscopy 2009    adenoma polyp    Family History  Problem Relation Age of Onset  . Other Brother      Social History:  reports that he has never smoked. He does not have any smokeless tobacco history on file. He reports that he does not use illicit drugs. His alcohol history not on file.  Allergies:  Allergies  Allergen Reactions  . Lisinopril Cough     ROS: ankle swelling, arthritis, diabetes, high blood pressure, kidney disease and shortness of breath  PHYSICAL EXAM: Blood pressure 155/77, pulse 85, temperature 98.1 F (36.7 C), temperature source Oral, resp. rate 18, height 6\' 2"  (1.88 m), weight 134.265 kg (296 lb), SpO2 95.00%. In general, the patient is a mildly overweight tall white man who had dyspnea with walking down our hall, and no dyspnea at rest. HEENT exam was within normal limits, neck was supple without jugular venous distention or carotid bruit, chest was clear to auscultation, heart had regular rate  and rhythm and was without significant murmur or gallop, abdomen had normal bowel sounds and no hepatosplenomegaly or tenderness, the left leg had 1+ pitting edema with prominent superficial veins and tenderness with palpation of the distal medial veins of the left thigh, and Homans test was normal.  Neurological exam: He is alert and well oriented with normal affect, cranial nerves II through XII are normal, muscle strength in all extremities with normal.  Skin exam was normal.  Results for orders placed during the hospital encounter of 04/07/12 (from the past 48 hour(s))  CBC     Status:  Normal   Collection Time   04/07/12  9:51 PM      Component Value Range Comment   WBC 6.9  4.0 - 10.5 K/uL    RBC 4.63  4.22 - 5.81 MIL/uL    Hemoglobin 14.5  13.0 - 17.0 g/dL    HCT 56.2  13.0 - 86.5 %    MCV 88.3  78.0 - 100.0 fL    MCH 31.3  26.0 - 34.0 pg    MCHC 35.5  30.0 - 36.0 g/dL    RDW 78.4  69.6 - 29.5 %    Platelets 189  150 - 400 K/uL   APTT     Status: Normal   Collection Time   04/07/12  9:51 PM      Component Value Range Comment   aPTT 28  24 - 37 seconds   PROTIME-INR     Status: Normal   Collection Time   04/07/12  9:51 PM      Component Value Range Comment   Prothrombin Time 13.8  11.6 - 15.2 seconds    INR 1.04  0.00 - 1.49    No results found. 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  He has been started on IV heparin pending completion of his workup that will include A CT scan of the chest with IV contrast and a left lower extremity DVT ultrasound exam.  We will also check a BNP test to exclude the unlikely possibility that he has congestive heart failure causing his dyspnea.  If he has a pulmonary embolism we will also check an echocardiogram to evaluate for evidence of right ventricular failure.  We will consider using xarelto if he does have venous thromboembolism. #2 Diabetes Mellitus, Type II: Under poor control on current medications with today's office hemoglobin A1c result of 12.7%.  We will explore further the reasons for his poor control of diabetes.  For now we will start him on moderate dose Lantus insulin with sliding scale NovoLog insulin as needed. #3 Hypertension: Well controlled on current medication.  Samiya Mervin G 04/07/2012, 10:36 PM

## 2012-04-08 ENCOUNTER — Inpatient Hospital Stay (HOSPITAL_COMMUNITY): Payer: 59

## 2012-04-08 DIAGNOSIS — R0602 Shortness of breath: Secondary | ICD-10-CM

## 2012-04-08 DIAGNOSIS — I517 Cardiomegaly: Secondary | ICD-10-CM

## 2012-04-08 LAB — GLUCOSE, CAPILLARY
Glucose-Capillary: 193 mg/dL — ABNORMAL HIGH (ref 70–99)
Glucose-Capillary: 243 mg/dL — ABNORMAL HIGH (ref 70–99)
Glucose-Capillary: 206 mg/dL — ABNORMAL HIGH (ref 70–99)

## 2012-04-08 LAB — BASIC METABOLIC PANEL
Calcium: 9.2 mg/dL (ref 8.4–10.5)
GFR calc Af Amer: >90 mL/min (ref >90)
GFR calc non Af Amer: >90 mL/min (ref >90)
Potassium: 4 mEq/L (ref 3.5–5.1)
Sodium: 138 mEq/L (ref 135–145)

## 2012-04-08 MED ORDER — ATORVASTATIN CALCIUM 10 MG PO TABS
10.0000 mg | ORAL_TABLET | Freq: Every day | ORAL | Status: DC
  Administered 2012-04-08: 10 mg via ORAL
  Filled 2012-04-08 (×2): qty 1

## 2012-04-08 MED ORDER — RIVAROXABAN 20 MG PO TABS: 20.0000 mg | ORAL_TABLET | Freq: Every day | ORAL | Status: DC

## 2012-04-08 MED ORDER — RIVAROXABAN 15 MG PO TABS
15.0000 mg | ORAL_TABLET | Freq: Two times a day (BID) | ORAL | Status: DC
  Administered 2012-04-08 – 2012-04-09 (×3): 15 mg via ORAL
  Filled 2012-04-08 (×4): qty 1

## 2012-04-08 MED ORDER — PNEUMOCOCCAL VAC POLYVALENT 25 MCG/0.5ML IJ INJ
0.5000 mL | INJECTION | Freq: Once | INTRAMUSCULAR | Status: AC
  Administered 2012-04-08: 0.5 mL via INTRAMUSCULAR
  Filled 2012-04-08: qty 0.5

## 2012-04-08 MED ORDER — IOHEXOL 350 MG/ML SOLN
100.0000 mL | Freq: Once | INTRAVENOUS | Status: AC | PRN
  Administered 2012-04-08: 100 mL via INTRAVENOUS

## 2012-04-08 NOTE — Progress Notes (Signed)
*  PRELIMINARY RESULTS* Vascular Ultrasound Lower extremity venous duplex has been completed.  Preliminary findings: Right= no evidence of DVT or baker's cyst. Left= evidence of DVT involving the femoral vein, popliteal vein, posterior tibial veins, and peroneal veins. No baker's cyst.  Farrel Demark, RDMS, RVT  04/08/2012, 9:26 AM

## 2012-04-08 NOTE — Progress Notes (Signed)
*  PRELIMINARY RESULTS* Echocardiogram 2D Echocardiogram has been performed.  Anthony Case 04/08/2012, 10:09 AM

## 2012-04-08 NOTE — Progress Notes (Signed)
Patient's Lower Extremity Venous Doppler preliminary reports indicates that patient's left leg evidence of DVT involving the femoral vein, popliteal vein, posterior tibial veins, and peroneal veins. No baker's cyst.  MD was called and left a message with his RN, awaiting orders, patient is on bedrest.  Patient is stable, VSS, neuro intact, breathing is WNL.

## 2012-04-08 NOTE — Progress Notes (Signed)
UR COMPLETED  

## 2012-04-09 ENCOUNTER — Other Ambulatory Visit: Payer: Self-pay

## 2012-04-09 DIAGNOSIS — I2699 Other pulmonary embolism without acute cor pulmonale: Secondary | ICD-10-CM | POA: Diagnosis present

## 2012-04-09 DIAGNOSIS — I82402 Acute embolism and thrombosis of unspecified deep veins of left lower extremity: Secondary | ICD-10-CM | POA: Diagnosis present

## 2012-04-09 LAB — BASIC METABOLIC PANEL
BUN: 12 mg/dL (ref 6–23)
CO2: 22 mEq/L (ref 19–32)
Calcium: 9.4 mg/dL (ref 8.4–10.5)
GFR calc non Af Amer: >90 mL/min (ref >90)
Glucose, Bld: 197 mg/dL — ABNORMAL HIGH (ref 70–99)
Potassium: 3.8 mEq/L (ref 3.5–5.1)

## 2012-04-09 LAB — GLUCOSE, CAPILLARY: Glucose-Capillary: 216 mg/dL — ABNORMAL HIGH (ref 70–99)

## 2012-04-09 LAB — ANTITHROMBIN III: AntiThromb III Func: 50 % — ABNORMAL LOW (ref 75–120)

## 2012-04-09 MED ORDER — KETOCONAZOLE 2 % EX SHAM: MEDICATED_SHAMPOO | CUTANEOUS | Status: AC

## 2012-04-09 MED ORDER — RIVAROXABAN 20 MG PO TABS: 20.0000 mg | ORAL_TABLET | Freq: Every day | ORAL | Status: DC

## 2012-04-09 MED ORDER — RIVAROXABAN 15 MG PO TABS: 15.0000 mg | ORAL_TABLET | Freq: Two times a day (BID) | ORAL | Status: DC

## 2012-04-09 MED ORDER — HYDROCODONE-ACETAMINOPHEN 5-325 MG PO TABS: 1.0000 | ORAL_TABLET | ORAL | Status: AC | PRN

## 2012-04-09 NOTE — Progress Notes (Signed)
Pt d/c instructions provided. Pt verbalized understanding. Pt xerelto instructions provided from pharmacy. Pt iv dc intact. ;pt under no s/s distress.

## 2012-04-09 NOTE — Care Management Note (Signed)
    Page 1 of 1   04/09/2012     2:37:37 PM   CARE MANAGEMENT NOTE 04/09/2012  Patient:  Anthony Case, Anthony Case   Account Number:  [id number]  Date Initiated:  04/09/2012  Documentation initiated by:  Onnie Boer  Subjective/Objective Assessment:   PT WAS ADMITTED WITH PE'S     Action/Plan:   PROGRESSION OF CARE AND DISCHARGE PLANNING   Anticipated DC Date:  04/09/2012   Anticipated DC Plan:  HOME/SELF CARE      DC Planning Services  CM consult      Choice offered to / List presented to:             Status of service:  Completed, signed off Medicare Important Message given?   (If response is "NO", the following Medicare IM given date fields will be blank) Date Medicare IM given:   Date Additional Medicare IM given:    Discharge Disposition:  HOME/SELF CARE  Per UR Regulation:  Reviewed for med. necessity/level of care/duration of stay  If discussed at Long Length of Stay Meetings, dates discussed:    Comments:  04/09/12 Onnie Boer, RN ,BSN 1436 PT WAS DC'D TO HOME WITH SELF CARE ON [address].

## 2012-04-09 NOTE — Discharge Summary (Signed)
Physician Discharge Summary  Patient ID: Anthony Case MRN: [id number] DOB/AGE: 02-Apr-1951 61 y.o.  Admit date: 04/07/2012 Discharge date: 04/09/2012   Discharge Diagnoses:  Principal Problem:  *Pulmonary embolism Active Problems:  Dyspnea on exertion  Leg edema, left  Left leg DVT  DIABETES, TYPE 2  SLEEP APNEA  HYPERTENSION   Discharged Condition: good  Hospital Course: The patient is a 61 year old Caucasian man who is well-known to me. He has several medical problems including diabetes mellitus, type II that has not been under very good control, hypertension, and a 2009 episode of acute pulmonary embolism with left lower extremity DVT that occurred after a airplane ride, and was severe enough to require TPA treatment. At that time history with Coumadin for 6 months and had not had recurrence. Over the past few days he has had increasing edema and pain in the left lower extremity, and today has developed acute dyspnea with minimal activity. He did not have substernal chest discomfort and has no history of coronary artery disease. He was admitted to the hospital for further evaluation. In the hospital he had a CT angiogram of the chest, a left lower extremity DVT ultrasound exam, and a transthoracic echocardiogram. The study showed a acute pulmonary embolism affecting both lungs with a small superior vena cava but no evidence of adjacent mass or lymph adenopathy, extensive DVT in the left lower extremity, and an echocardiogram showing normal left ventricular systolic function with no significant valvular heart disease and poor visualization of the right ventricle. He did well with initiation of IV heparin and Xarelto and is no longer having any significant dyspnea. We will attempt to send a hypercoagulable workup on his initial serum specimen. He had no complications from his hospitalization and will be discharged to home today, provided that Xarelto is covered by his insurance company. I     Consults: None  Significant Diagnostic Studies:  Ct Angio Chest Pe W/cm &/or Wo Cm  04/08/2012  *RADIOLOGY REPORT*  Clinical Data: Acute dyspnea, history of DVT, leg swelling.  CT ANGIOGRAPHY CHEST  Technique:  Multidetector CT imaging of the chest using the standard protocol during bolus administration of intravenous contrast. Multiplanar reconstructed images including MIPs were obtained and reviewed to evaluate the vascular anatomy.  Contrast: OMNIPAQUE IOHEXOL 350 MG/ML SOLN  Comparison: 07/19/2003  Findings: There are bilateral   pulmonary emboli, occlusive in the left pulmonary artery distally with extension   into upper and lower lobe   segmental branches, and on the right segmental emboli into bilateral upper and lower lobes.  No pleural or pericardial effusion.  Patchy coronary and aortic calcifications.  No thoracic aortic dissection or aneurysm.  No hilar or mediastinal adenopathy. Lungs are clear.  There is marked narrowing of the SVC throughout its length without significant to adjacent mass or adenopathy. Multiple body wall collaterals provide drainage of the right upper extremity.  Visualized portions of upper abdomen unremarkable. Flowing osteophytes throughout the thoracic spine.  IMPRESSION:  1.  Bilateral central occlusive pulmonary emboli as above.  I telephoned the critical test results to Tobi Bastos RN  at the time of interpretation.  Original Report Authenticated By: Osa Craver, M.D.    Labs: Lab Results  Component Value Date   WBC 6.9 04/07/2012   HGB 14.5 04/07/2012   HCT 40.9 04/07/2012   MCV 88.3 04/07/2012   PLT 189 04/07/2012     Lab 04/09/12 0610  NA 139  K 3.8  CL 105  CO2  22  BUN 12  CREATININE 0.82  CALCIUM 9.4  PROT --  BILITOT --  ALKPHOS --  ALT --  AST --  GLUCOSE 197*       Lab Results  Component Value Date   INR 1.04 04/07/2012   INR 1.0 05/17/2009     No results found for this or any previous visit (from the past 240 hour(s)).     Discharge Exam: Blood pressure 139/71, pulse 57, temperature 97.3 F (36.3 C), temperature source Oral, resp. rate 18, height 6\' 2"  (1.88 m), weight 134.265 kg (296 lb), SpO2 96.00%.  Physical Exam: In general, he is a mildly overweight tall gentleman who was in no apparent distress. HEENT exam was within normal limits, chest was clear to auscultation, heart had a regular rate and rhythm with a systolic ejection murmur grade 2/6 and no rub, abdomen had normal bowel sounds and no hepatosplenomegaly or tenderness, he had bilateral 1+ leg edema (left greater than right). He was able to ambulate without dyspnea or difficulty. Skin exam showed mild crusting rash in the center of his left palm suggestive of a fungal infection  Disposition: he'll be discharged to home today and will continue treatment with Xarelto at 15 mg by mouth twice daily for the next 21 days and then switched to Xarelto 20 mg daily. We will arrange for a followup visit next week and we'll discuss ways to optimize control blood glucose levels at that time. He was advised to call should he have any difficulties in the interim.  Discharge Orders    Future Orders Please Complete By Expires   Diet - low sodium heart healthy      Increase activity slowly      Discharge instructions      Comments:   Limit walking and exercise to what is needed to do activities of daily living. Call Dr. Eloise Harman if breathing worsens or develop chest pain, unusual bleeding, or any other concerning symptoms. Call [phone] today to set up a followup office visit next week.   Call MD for:  temperature >100.4      Call MD for:  difficulty breathing, headache or visual disturbances      Call MD for:  extreme fatigue      Care order/instruction      Comments:   He should not be discharged to home until it is confirmed that his insurance will cover the new Xarelto prescriptions     Medication List  As of 04/09/2012  8:35 AM   STOP taking these  medications         aspirin 325 MG EC tablet         TAKE these medications         diltiazem 60 MG tablet   Commonly known as: CARDIZEM   Take 120 mg by mouth every morning.      glipiZIDE 5 MG tablet   Commonly known as: GLUCOTROL   Take 5 mg by mouth every morning.      HYDROcodone-acetaminophen 5-325 MG per tablet   Commonly known as: NORCO/VICODIN   Take 1-2 tablets by mouth every 4 (four) hours as needed.      insulin detemir 100 UNIT/ML injection   Commonly known as: LEVEMIR   Inject 50 Units into the skin at bedtime.      ketoconazole 2 % shampoo   Commonly known as: NIZORAL   Apply topically 2 (two) times a week. Use ketoconazole cream to rash once daily (  unable to find this choice in computer)      lovastatin 20 MG tablet   Commonly known as: MEVACOR   Take 20 mg by mouth every morning.      metFORMIN 500 MG tablet   Commonly known as: GLUCOPHAGE   Take 1,000 mg by mouth daily with breakfast.      Rivaroxaban 15 MG Tabs tablet   Commonly known as: XARELTO   Take 1 tablet (15 mg total) by mouth 2 (two) times daily.      Rivaroxaban 20 MG Tabs   Commonly known as: XARELTO   Take 1 tablet (20 mg total) by mouth daily.             Signed: Garlan Fillers 04/09/2012, 8:35 AM

## 2012-04-12 LAB — LUPUS ANTICOAGULANT PANEL
DRVVT: 72.2 secs — ABNORMAL HIGH (ref <45.1)
Lupus Anticoagulant: NOT DETECTED

## 2012-04-12 LAB — PROTEIN C ACTIVITY: Protein C Activity: 166 % — ABNORMAL HIGH (ref 75–133)

## 2012-04-15 LAB — BETA-2-GLYCOPROTEIN I ABS, IGG/M/A: Beta-2 Glyco I IgG: 0 G Units (ref <20)

## 2012-04-15 LAB — CARDIOLIPIN ANTIBODIES, IGG, IGM, IGA: Anticardiolipin IgA: 2 APL U/mL — ABNORMAL LOW (ref <22)

## 2013-02-16 ENCOUNTER — Encounter: Payer: Self-pay | Admitting: Gastroenterology

## 2013-05-23 ENCOUNTER — Emergency Department (HOSPITAL_COMMUNITY): Payer: 59

## 2013-05-23 ENCOUNTER — Encounter (HOSPITAL_COMMUNITY): Payer: Self-pay | Admitting: Nurse Practitioner

## 2013-05-23 ENCOUNTER — Inpatient Hospital Stay (HOSPITAL_COMMUNITY)
Admission: EM | Admit: 2013-05-23 | Discharge: 2013-05-27 | DRG: 516 | Disposition: A | Discharge status: Home / Self Care | Payer: 59 | Attending: Neurological Surgery | Admitting: Neurological Surgery

## 2013-05-23 DIAGNOSIS — M129 Arthropathy, unspecified: Secondary | ICD-10-CM | POA: Diagnosis present

## 2013-05-23 DIAGNOSIS — E119 Type 2 diabetes mellitus without complications: Secondary | ICD-10-CM | POA: Diagnosis present

## 2013-05-23 DIAGNOSIS — I739 Peripheral vascular disease, unspecified: Secondary | ICD-10-CM | POA: Diagnosis present

## 2013-05-23 DIAGNOSIS — I10 Essential (primary) hypertension: Secondary | ICD-10-CM | POA: Diagnosis present

## 2013-05-23 DIAGNOSIS — S22009A Unspecified fracture of unspecified thoracic vertebra, initial encounter for closed fracture: Secondary | ICD-10-CM

## 2013-05-23 DIAGNOSIS — Z7901 Long term (current) use of anticoagulants: Secondary | ICD-10-CM

## 2013-05-23 DIAGNOSIS — W010XXA Fall on same level from slipping, tripping and stumbling without subsequent striking against object, initial encounter: Secondary | ICD-10-CM | POA: Diagnosis present

## 2013-05-23 DIAGNOSIS — G4730 Sleep apnea, unspecified: Secondary | ICD-10-CM | POA: Diagnosis present

## 2013-05-23 DIAGNOSIS — Z86711 Personal history of pulmonary embolism: Secondary | ICD-10-CM

## 2013-05-23 DIAGNOSIS — M459 Ankylosing spondylitis of unspecified sites in spine: Principal | ICD-10-CM | POA: Diagnosis present

## 2013-05-23 LAB — CREATININE, SERUM
Creatinine, Ser: 0.77 mg/dL (ref 0.50–1.35)
GFR calc Af Amer: >90 mL/min (ref >90)

## 2013-05-23 LAB — CBC
HCT: 43.7 % (ref 39.0–52.0)
Hemoglobin: 14.9 g/dL (ref 13.0–17.0)
MCH: 31.1 pg (ref 26.0–34.0)
MCHC: 34.1 g/dL (ref 30.0–36.0)
MCV: 91.2 fL (ref 78.0–100.0)
Platelets: 242 10*3/uL (ref 150–400)
RBC: 4.79 MIL/uL (ref 4.22–5.81)
RDW: 13.4 % (ref 11.5–15.5)
WBC: 7.3 10*3/uL (ref 4.0–10.5)

## 2013-05-23 MED ORDER — INSULIN DETEMIR 100 UNIT/ML ~~LOC~~ SOLN
60.0000 [IU] | Freq: Every day | SUBCUTANEOUS | Status: DC
  Administered 2013-05-23 – 2013-05-26 (×4): 60 [IU] via SUBCUTANEOUS
  Filled 2013-05-23 (×5): qty 0.6

## 2013-05-23 MED ORDER — HYDROMORPHONE HCL PF 1 MG/ML IJ SOLN
1.0000 mg | Freq: Once | INTRAMUSCULAR | Status: AC
  Administered 2013-05-23: 1 mg via INTRAVENOUS
  Filled 2013-05-23: qty 1

## 2013-05-23 MED ORDER — DAPAGLIFLOZIN PROPANEDIOL 10 MG PO TABS
10.0000 mg | ORAL_TABLET | Freq: Every day | ORAL | Status: DC
  Administered 2013-05-26: 10 mg via ORAL
  Filled 2013-05-23 (×4): qty 1

## 2013-05-23 MED ORDER — GLIPIZIDE 5 MG PO TABS
5.0000 mg | ORAL_TABLET | Freq: Every day | ORAL | Status: DC
  Administered 2013-05-24 – 2013-05-27 (×3): 5 mg via ORAL
  Filled 2013-05-23 (×5): qty 1

## 2013-05-23 MED ORDER — MAGNESIUM CITRATE PO SOLN
1.0000 | Freq: Once | ORAL | Status: AC | PRN
  Filled 2013-05-23: qty 296

## 2013-05-23 MED ORDER — DIAZEPAM 5 MG PO TABS
10.0000 mg | ORAL_TABLET | Freq: Once | ORAL | Status: AC
  Administered 2013-05-24: 10 mg via ORAL
  Filled 2013-05-23: qty 2

## 2013-05-23 MED ORDER — POLYETHYLENE GLYCOL 3350 17 G PO PACK
17.0000 g | PACK | Freq: Every day | ORAL | Status: DC | PRN
  Filled 2013-05-23: qty 1

## 2013-05-23 MED ORDER — HYDROMORPHONE HCL PF 1 MG/ML IJ SOLN
1.0000 mg | INTRAMUSCULAR | Status: DC | PRN
  Administered 2013-05-24: 1 mg via INTRAVENOUS
  Administered 2013-05-25 (×2): 0.5 mg via INTRAVENOUS
  Filled 2013-05-23 (×2): qty 1

## 2013-05-23 MED ORDER — METFORMIN HCL 500 MG PO TABS
1000.0000 mg | ORAL_TABLET | Freq: Every day | ORAL | Status: DC
  Administered 2013-05-24 – 2013-05-27 (×3): 1000 mg via ORAL
  Filled 2013-05-23 (×5): qty 2

## 2013-05-23 MED ORDER — SENNA 8.6 MG PO TABS
1.0000 | ORAL_TABLET | Freq: Two times a day (BID) | ORAL | Status: DC
  Administered 2013-05-23 – 2013-05-27 (×4): 8.6 mg via ORAL
  Filled 2013-05-23 (×9): qty 1

## 2013-05-23 MED ORDER — DILTIAZEM HCL 60 MG PO TABS
60.0000 mg | ORAL_TABLET | Freq: Every day | ORAL | Status: DC
Start: 2013-05-24 — End: 2013-05-27
  Administered 2013-05-24 – 2013-05-27 (×4): 60 mg via ORAL
  Filled 2013-05-23 (×4): qty 1

## 2013-05-23 MED ORDER — SIMVASTATIN 10 MG PO TABS
10.0000 mg | ORAL_TABLET | Freq: Every day | ORAL | Status: DC
  Administered 2013-05-24 – 2013-05-26 (×2): 10 mg via ORAL
  Filled 2013-05-23 (×4): qty 1

## 2013-05-23 MED ORDER — KETOROLAC TROMETHAMINE 15 MG/ML IJ SOLN
15.0000 mg | Freq: Four times a day (QID) | INTRAMUSCULAR | Status: AC
  Administered 2013-05-24 – 2013-05-25 (×5): 15 mg via INTRAVENOUS
  Filled 2013-05-23 (×5): qty 1

## 2013-05-23 MED ORDER — DOCUSATE SODIUM 100 MG PO CAPS
100.0000 mg | ORAL_CAPSULE | Freq: Two times a day (BID) | ORAL | Status: DC
  Administered 2013-05-23 – 2013-05-27 (×4): 100 mg via ORAL
  Filled 2013-05-23 (×9): qty 1

## 2013-05-23 MED ORDER — DIAZEPAM 2 MG PO TABS
2.0000 mg | ORAL_TABLET | Freq: Three times a day (TID) | ORAL | Status: DC | PRN
  Administered 2013-05-23 – 2013-05-25 (×3): 2 mg via ORAL
  Filled 2013-05-23 (×2): qty 1

## 2013-05-23 MED ORDER — BISACODYL 10 MG RE SUPP: 10.0000 mg | Freq: Every day | RECTAL | Status: DC | PRN

## 2013-05-23 MED ORDER — ENOXAPARIN SODIUM 40 MG/0.4ML ~~LOC~~ SOLN
40.0000 mg | SUBCUTANEOUS | Status: DC
  Administered 2013-05-23 – 2013-05-24 (×2): 40 mg via SUBCUTANEOUS
  Filled 2013-05-23 (×4): qty 0.4

## 2013-05-23 NOTE — ED Provider Notes (Signed)
CSN: [id number]     Arrival date & time 05/23/13  1223 History   This chart was scribed for non-physician practitioner Allean Found, PA-C working with Layla Maw Ward, DO by Valera Castle, ED scribe. This patient was seen in room TR11C/TR11C and the patient's care was started at 1:57 PM.    Chief Complaint  Patient presents with  . Back Injury    Patient is a 62 y.o. male presenting with back pain. The history is provided by the patient. No language interpreter was used.  Back Pain Location:  Thoracic spine Pain severity:  Severe Onset quality:  Sudden Duration:  1 week Timing:  Constant Progression:  Unchanged Chronicity:  Recurrent Relieved by:  Nothing Worsened by:  Twisting and movement Associated symptoms: no abdominal pain    HPI Comments: Anthony Case is a 62 y.o. male with a h/o back surgery, arthritis, hypertension, DM, SOB, pulmonary embolism, and sleep apnea who presents to the Emergency Department complaining of sudden, severe, constant back pain, onset last week when he fell onto his back while moving a refrigerator. He states that movement exacerbates the pain to a severity of 9/10, and reports that when he pivots he experiences back spasms. He states he has seen Dr. Farris Has last week and was given steroids, muscle relaxers, and pain medications with no relief. He states he was also started on electric stimulation therapy and has this 2nd treatment today, with no relief. He states Dr. Farris Has told him to visit the ED to get control of the pain. He has an allergy to Lisinopril. He denies nausea, emesis, diarrhea, abdominal pain, chest pain, and any other associated symptoms.    Past Medical History  Diagnosis Date  . Hypertension   . Shortness of breath   . Sleep apnea   . Diabetes mellitus   . Arthritis   . Hx pulmonary embolism 2009    required emergent tPA treatment   Past Surgical History  Procedure Laterality Date  . Back surgery    . Elbow arthroscopy   2010  . Colonoscopy  2009    adenoma polyp   Family History  Problem Relation Age of Onset  . Other Brother    History  Substance Use Topics  . Smoking status: Never Smoker   . Smokeless tobacco: Not on file  . Alcohol Use: Not on file    Review of Systems  Gastrointestinal: Negative for nausea, vomiting, abdominal pain and diarrhea.  Musculoskeletal: Positive for back pain.  All other systems reviewed and are negative.    Allergies  Lisinopril  Home Medications   Current Outpatient Rx  Name  Route  Sig  Dispense  Refill  . diltiazem (CARDIZEM) 60 MG tablet   Oral   Take 120 mg by mouth every morning.         Marland Kitchen glipiZIDE (GLUCOTROL) 5 MG tablet   Oral   Take 5 mg by mouth every morning.         . insulin detemir (LEVEMIR) 100 UNIT/ML injection   Subcutaneous   Inject 50 Units into the skin at bedtime.         . lovastatin (MEVACOR) 20 MG tablet   Oral   Take 20 mg by mouth every morning.         . metFORMIN (GLUCOPHAGE) 500 MG tablet   Oral   Take 1,000 mg by mouth daily with breakfast.         . Rivaroxaban (XARELTO) 15 MG TABS  tablet   Oral   Take 1 tablet (15 mg total) by mouth 2 (two) times daily.   42 tablet   0   . Rivaroxaban (XARELTO) 20 MG TABS   Oral   Take 1 tablet (20 mg total) by mouth daily.   30 tablet   5    Triage Vitals: BP 163/86  Pulse 88  Temp(Src) 98.2 F (36.8 C) (Oral)  Resp 24  Ht 6\' 2"  (1.88 m)  Wt 300 lb (136.079 kg)  BMI 38.5 kg/m2  SpO2 96%  Physical Exam  Nursing note and vitals reviewed. Constitutional: He is oriented to person, place, and time. He appears well-developed and well-nourished. No distress.  HENT:  Head: Normocephalic and atraumatic.  Eyes: EOM are normal.  Neck: Neck supple. No tracheal deviation present.  Cardiovascular: Normal rate.   Pulmonary/Chest: Effort normal and breath sounds normal. No respiratory distress. He exhibits no tenderness.  Abdominal: Soft. There is no  tenderness.  Musculoskeletal: Normal range of motion.  Point tenderness over midline T-10. Minimal left perithoracic tenderness. No palpable abnormalities.   Neurological: He is alert and oriented to person, place, and time.  Skin: Skin is warm and dry.  Psychiatric: He has a normal mood and affect. His behavior is normal.    ED Course  Procedures (including critical care time)  DIAGNOSTIC STUDIES: Oxygen Saturation is 96% on room air, normal by my interpretation.    COORDINATION OF CARE: 2:02 PM-Discussed treatment plan which includes Dilaudid, saline lock IV, and CT thoracic spine without contrast with pt at bedside and pt agreed to plan.      Labs Review Labs Reviewed - No data to display Imaging Review No results found.  MDM  No diagnosis found. 1. Back pain  Patient care transferred to [facility] pending CT of thoracic spine.    I personally performed the services described in this documentation, which was scribed in my presence. The recorded information has been reviewed and is accurate.     Arnoldo Hooker, PA-C 05/23/13 1533

## 2013-05-23 NOTE — ED Provider Notes (Signed)
Medical screening examination/treatment/procedure(s) were performed by non-physician practitioner and as supervising physician I was immediately available for consultation/collaboration.  Layla Maw Novi Calia, DO 05/23/13 1606

## 2013-05-23 NOTE — ED Provider Notes (Signed)
Medical screening examination/treatment/procedure(s) were conducted as a shared visit with non-physician practitioner(s) and myself.  I personally evaluated the patient during the encounter  I interviewed and examined the patient. Lungs are CTAB. Cardiac exam wnl. Abdomen soft.    Junius Argyle, MD 05/23/13 [phone]

## 2013-05-23 NOTE — ED Provider Notes (Signed)
Pt signed out to me by Elpidio Anis PA-C at shift change.  Waiting on CT thoracic spine. Plan is to manage pain, evaluate CT results, and reevaluate. Dr. Romeo Apple, was consulted by radiology, pt has an unstable thoracic spine fracture.  He will be moved to an acute care area and placed in supine position.  Second dose of dilaudid ordered. Dr. Elesa Massed will consult neurosurgery and assume further care of patient.   Junius Finner, PA-C 05/23/13 1730  Junius Finner, PA-C 05/23/13 2348

## 2013-05-23 NOTE — ED Provider Notes (Signed)
Medical screening examination/treatment/procedure(s) were conducted as a shared visit with non-physician practitioner(s) and myself.  I personally evaluated the patient during the encounter.  4:18 PM I interviewed and examined the patient. Lungs are CTAB. Cardiac exam wnl. Abdomen soft.   I was notified of an unstable t spine fx by radiology. I went immediately to examine the pt who is sitting up in a chair in [facility]. I notified the PA and nursing to place the patient under spinal precautions, will have him placed in the supine position.   Pt was transferred to pod A where I was working. He is unable to lay completely flat d/t pain. I notified him of the risks and that we must maintain spinal precautions, he understands. I re-examined him and found no motor or sensory deficits. He denies any weakness or numbness. Will consult [facility] for evaluation.   Pt admitted to [facility].   Clinical Impression 1. Thoracic spine fracture, closed, initial encounter      Junius Argyle, MD 05/24/13 1143

## 2013-05-23 NOTE — ED Notes (Signed)
Patient had a large bm,

## 2013-05-23 NOTE — H&P (Signed)
Anthony Case is an 62 y.o. male.   Chief Complaint: Back pain, T11 fracture HPI: Patient is a 62 year old white male who was moving a refrigerator about a week ago. He developed sudden and severe back pain when he fell backwards onto his buttocks. No she felt a loud pop in his back. He wasn't aware that he has ankylosing spondylitis. He was seen by Dr. Farris Has. History for back pain. When things were on tolerable he came to the emergency room. Here a CT scan of the thoracic spine was performed, this demonstrates the presence of a T11 Chance fracture and significant evidence for ankylosing spondylitis with all his vertebrae demonstrating syndesmophytes. I was contacted to evaluate this patient. It seems that during the week he is not tolerating strong doses of narcotic pain medication. He notes that he tends this sit forward stoop in order to maintain some level of comfort. His neck is always been forward stoop. He is not aware that he had a diagnosis of ankylosing spondylitis.  Past Medical History  Diagnosis Date  . Hypertension   . Shortness of breath   . Sleep apnea   . Diabetes mellitus   . Arthritis   . Hx pulmonary embolism 2009    required emergent tPA treatment    Past Surgical History  Procedure Laterality Date  . Back surgery    . Elbow arthroscopy  2010  . Colonoscopy  2009    adenoma polyp    Family History  Problem Relation Age of Onset  . Other Brother    Social History:  reports that he has never smoked. He does not have any smokeless tobacco history on file. He reports that he does not use illicit drugs. His alcohol history is not on file.  Allergies:  Allergies  Allergen Reactions  . Lisinopril Cough     (Not in a hospital admission)  No results found for this or any previous visit (from the past 48 hour(s)). Ct Thoracic Spine Wo Contrast  05/23/2013   CLINICAL DATA:  Back injury. Fall 1 week ago and moving a refrigerator.  EXAM: CT THORACIC SPINE WITHOUT  CONTRAST  TECHNIQUE: Multidetector CT imaging of the thoracic spine was performed without intravenous contrast administration. Multiplanar CT image reconstructions were also generated.  COMPARISON:  CTA chest 04/08/2012.  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 The spine is imaged from the midbody of C7 through L3-4. There is anterior and posterior fusion throughout the visualized spine. An oblique fracture extends through both pedicles at T11 and extends through the posterior spinous ligament ossification. There is 5 mm of anterolisthesis and associated with this oblique fracture. Osseous foraminal narrowing is worse on the left at T11-12. No other significant osseous stenosis is evident. A healing posterior left 11th rib fracture is also present.  A 3 mm nonobstructing mid stone is present at the lower pole of the right kidney. At least 2 punctate nonobstructing stones are present within the left kidney. Atherosclerotic calcifications are present in the aorta without aneurysm. Coronary artery calcifications are noted as well.  The visualized lungs demonstrate scarring at the left lung base. No focal nodule, mass, or airspace disease is present otherwise.  IMPRESSION: 1. Oblique 3 column fracture at T11 in the setting of ankylosing spondylitis. 2. 5 mm anterolisthesis across the fracture line at T11. 3. Mild osseous foraminal narrowing at T10-11 and, left greater than right. This likely precedes the fracture. 4. Atherosclerosis.  CriticalValue/emergent results were  called by telephone at the time of interpretation on 05/23/2013 at 4:26 PMto Dr. Romeo Apple, who verbally acknowledged these results.   Electronically Signed   By: Gennette Pac   On: 05/23/2013 16:26    Review of Systems  Constitutional: Negative.   HENT: Negative.   Eyes: Negative.   Respiratory: Negative.   Cardiovascular: Negative.   Gastrointestinal: Negative.   Genitourinary: Negative.   Musculoskeletal:  Positive for back pain.  Skin: Negative.   Neurological: Negative.   Endo/Heme/Allergies:       Nonsteroidal toe for recurrent PEs  Psychiatric/Behavioral: Negative.     Blood pressure 162/71, pulse 73, temperature 97.7 F (36.5 C), temperature source Oral, resp. rate 19, height 6\' 2"  (1.88 m), weight 136.079 kg (300 lb), SpO2 93.00%. Physical Exam  Constitutional: He is oriented to person, place, and time. He appears well-developed and well-nourished.  HENT:  Head: Normocephalic and atraumatic.  Eyes: Conjunctivae and EOM are normal. Pupils are equal, round, and reactive to light.  Neck: Normal range of motion. Neck supple.  Cardiovascular: Regular rhythm.   Respiratory: Effort normal and breath sounds normal.  GI: Soft. Bowel sounds are normal.  Musculoskeletal:  Tenderness over thoracolumbar junction.  Neurological: He is oriented to person, place, and time. He has normal reflexes.  Lower strength appears intact in lower extremities. ME strength is normal. Cranial nerve examination is normal.  Skin: Skin is warm and dry.  Psychiatric: He has a normal mood and affect. His behavior is normal. Judgment and thought content normal.     Assessment/Plan Ankylosing spondylitis with T11 Chance fracture.  This is an unstable fracture. I discussed with the patient that he is very fortunate to be moving his legs and have a normal neurologic examination. I believe that ultimately he will require surgical stabilization. Prior to this his overall, she stopped. I also like to obtain an MRI of his thoracic spine to see him much room is around his cord. I'll order the MRI of the thoracic spine today. He will require some substantial medication for sedation in order to maintain some level of comfort for this study.  Anthony Case J 05/23/2013, 8:16 PM

## 2013-05-23 NOTE — ED Notes (Signed)
Last week pt fell onto back moving a refrigerator and injured his back. Pt saw dr Farris Has in office last week and was given steroids, muscle relaxers, pain medications with no relief. Was also started on electric stimulation therapy and had his 2nd treatment today with no relief. Dr Farris Has told the pt to come here for further management of pain since they are unable to control his pain in office.

## 2013-05-24 ENCOUNTER — Inpatient Hospital Stay (HOSPITAL_COMMUNITY): Payer: 59

## 2013-05-24 LAB — GLUCOSE, CAPILLARY: Glucose-Capillary: 85 mg/dL (ref 70–99)

## 2013-05-24 LAB — MRSA PCR SCREENING: MRSA by PCR: NEGATIVE

## 2013-05-24 NOTE — Progress Notes (Signed)
Pt. Advised to ask wife to bring diabetic medication Dapagliflozin Propanediol. Pharmacy does not have this on formulary.

## 2013-05-24 NOTE — Progress Notes (Signed)
Subjective: Patient reports   Back pain under good control. However the patient could not tolerate MRI of thoracic spine. Remains neurologically intact.  Objective: Vital signs in last 24 hours: Temp:  [97.5 F (36.4 C)-97.7 F (36.5 C)] 97.7 F (36.5 C) (09/30 1600) Pulse Rate:  [58-89] 58 (09/30 1600) Resp:  [15-27] 17 (09/30 1600) BP: (92-174)/(49-97) 154/75 mmHg (09/30 1600) SpO2:  [91 %-97 %] 96 % (09/30 1600)  Intake/Output from previous day: 09/29 0701 - 09/30 0700 In: 340 [P.O.:340] Out: 500 [Urine:500] Intake/Output this shift: Total I/O In: 120 [P.O.:120] Out: 150 [Urine:150]  Motor function is intact  Lab Results:  Recent Labs  05/23/13 2219  WBC 7.3  HGB 14.9  HCT 43.7  PLT 242   BMET  Recent Labs  05/23/13 2219  CREATININE 0.77    Studies/Results: Ct Thoracic Spine Wo Contrast  05/23/2013   CLINICAL DATA:  Back injury. Fall 1 week ago and moving a refrigerator.  EXAM: CT THORACIC SPINE WITHOUT CONTRAST  TECHNIQUE: Multidetector CT imaging of the thoracic spine was performed without intravenous contrast administration. Multiplanar CT image reconstructions were also generated.  COMPARISON:  CTA chest 04/08/2012.  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 The spine is imaged from the midbody of C7 through L3-4. There is anterior and posterior fusion throughout the visualized spine. An oblique fracture extends through both pedicles at T11 and extends through the posterior spinous ligament ossification. There is 5 mm of anterolisthesis and associated with this oblique fracture. Osseous foraminal narrowing is worse on the left at T11-12. No other significant osseous stenosis is evident. A healing posterior left 11th rib fracture is also present.  A 3 mm nonobstructing mid stone is present at the lower pole of the right kidney. At least 2 punctate nonobstructing stones are present within the left kidney. Atherosclerotic  calcifications are present in the aorta without aneurysm. Coronary artery calcifications are noted as well.  The visualized lungs demonstrate scarring at the left lung base. No focal nodule, mass, or airspace disease is present otherwise.  IMPRESSION: 1. Oblique 3 column fracture at T11 in the setting of ankylosing spondylitis. 2. 5 mm anterolisthesis across the fracture line at T11. 3. Mild osseous foraminal narrowing at T10-11 and, left greater than right. This likely precedes the fracture. 4. Atherosclerosis.  CriticalValue/emergent results were called by telephone at the time of interpretation on 05/23/2013 at 4:26 PMto Dr. Romeo Apple, who verbally acknowledged these results.   Electronically Signed   By: Gennette Pac   On: 05/23/2013 16:26    Assessment/Plan: Stable appreciate help of Dr. Jarold Motto. We'll plan on scheduling percutaneous fixation of T11 fracture in next few days.  LOS: 1 day    Schedule surgery next couple of days   Anthony Case J 05/24/2013, 6:07 PM

## 2013-05-24 NOTE — Consult Note (Addendum)
Reason for Consult:  Manage diabetes and anticoagulation perioperatively Referring Physician: [name] Case is an 62 y.o. male.  HPI: The patient is a 62 year old Caucasian man who is well-known to me as I service his primary care physician. He has been in his usual state of fairly good health until this past weekend when he was trying to move a refrigerator and fell onto his back resulting in immediate pain in the mid and lower back. The pain progressed over the next few days and is moderately severe with any upright activity or lateral movement and led to an emergency room evaluation and diagnosis of an acute T11 vertebral body fracture. Currently his pain is at a level of 2/10 when lying supine with his head and neck supported. His pain is quite intense with any other movement. He had some problems with constipation which have resolved. He has not had any problems with shortness of breath, cough, chest pain, palpitations, abdominal pain, difficulty urinating, or numbness or weakness in his lower extremities. His past medical history is most significant for 2 episodes of pulmonary embolism for which and he is on chronic Xarelto anticoagulation, diabetes mellitus, type II under reasonable control with recent significant weight loss associated with starting a new diabetes medication, Farxiga, which improves blood glucose control by facilitating loss of glucose in the urine at relatively moderate blood glucose levels. He also has a history of hypertension and obstructive sleep apnea for which he uses CPAP regularly.  Past Medical History  Diagnosis Date  . Hypertension   . Shortness of breath   . Sleep apnea   . Diabetes mellitus   . Arthritis   . Hx pulmonary embolism 2009    required emergent tPA treatment    Past Surgical History  Procedure Laterality Date  . Back surgery    . Elbow arthroscopy  2010  . Colonoscopy  2009    adenoma polyp    Family History  Problem  Relation Age of Onset  . Other Brother     Social History:  reports that he has never smoked. He does not have any smokeless tobacco history on file. He reports that he does not use illicit drugs. His alcohol history is not on file.  Allergies:  Allergies  Allergen Reactions  . Lisinopril Cough    Medications: I have reviewed the patient's current medications.  Results for orders placed during the hospital encounter of 05/23/13 (from the past 48 hour(s))  MRSA PCR SCREENING     Status: None   Collection Time    05/23/13 10:00 PM      Result Value Range   MRSA by PCR NEGATIVE  NEGATIVE   Comment:            The GeneXpert MRSA Assay (FDA     approved for NASAL specimens     only), is one component of a     comprehensive MRSA colonization     surveillance program. It is not     intended to diagnose MRSA     infection nor to guide or     monitor treatment for     MRSA infections.  CBC     Status: None   Collection Time    05/23/13 10:19 PM      Result Value Range   WBC 7.3  4.0 - 10.5 K/uL   RBC 4.79  4.22 - 5.81 MIL/uL   Hemoglobin 14.9  13.0 - 17.0 g/dL   HCT  43.7  39.0 - 52.0 %   MCV 91.2  78.0 - 100.0 fL   MCH 31.1  26.0 - 34.0 pg   MCHC 34.1  30.0 - 36.0 g/dL   RDW 16.1  09.6 - 04.5 %   Platelets 242  150 - 400 K/uL  CREATININE, SERUM     Status: None   Collection Time    05/23/13 10:19 PM      Result Value Range   Creatinine, Ser 0.77  0.50 - 1.35 mg/dL   GFR calc non Af Amer >90  >90 mL/min   GFR calc Af Amer >90  >90 mL/min   Comment: (NOTE)     The eGFR has been calculated using the CKD EPI equation.     This calculation has not been validated in all clinical situations.     eGFR's persistently <90 mL/min signify possible Chronic Kidney     Disease.    Ct Thoracic Spine Wo Contrast  05/23/2013   CLINICAL DATA:  Back injury. Fall 1 week ago and moving a refrigerator.  EXAM: CT THORACIC SPINE WITHOUT CONTRAST  TECHNIQUE: Multidetector CT imaging of the  thoracic spine was performed without intravenous contrast administration. Multiplanar CT image reconstructions were also generated.  COMPARISON:  CTA chest 04/08/2012.  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 The spine is imaged from the midbody of C7 through L3-4. There is anterior and posterior fusion throughout the visualized spine. An oblique fracture extends through both pedicles at T11 and extends through the posterior spinous ligament ossification. There is 5 mm of anterolisthesis and associated with this oblique fracture. Osseous foraminal narrowing is worse on the left at T11-12. No other significant osseous stenosis is evident. A healing posterior left 11th rib fracture is also present.  A 3 mm nonobstructing mid stone is present at the lower pole of the right kidney. At least 2 punctate nonobstructing stones are present within the left kidney. Atherosclerotic calcifications are present in the aorta without aneurysm. Coronary artery calcifications are noted as well.  The visualized lungs demonstrate scarring at the left lung base. No focal nodule, mass, or airspace disease is present otherwise.  IMPRESSION: 1. Oblique 3 column fracture at T11 in the setting of ankylosing spondylitis. 2. 5 mm anterolisthesis across the fracture line at T11. 3. Mild osseous foraminal narrowing at T10-11 and, left greater than right. This likely precedes the fracture. 4. Atherosclerosis.  CriticalValue/emergent results were called by telephone at the time of interpretation on 05/23/2013 at 4:26 PMto Dr. Romeo Apple, who verbally acknowledged these results.   Electronically Signed   By: Gennette Pac   On: 05/23/2013 16:26    ROS: No recent fever, chills, shortness breath, cough, chest pain, palpitations, nausea, vomiting, dysuria, frequency, numbness, or weakness.  Blood pressure 92/49, pulse 72, temperature 97.5 F (36.4 C), temperature source Oral, resp. rate 15, height 6\' 2"  (1.88 m),  weight 136.079 kg (300 lb), SpO2 97.00%.  PHYSICAL EXAM:  In general, the patient is a mildly overweight white man who grimaced with pain with side to side movement in his bed to try to get more comfortable. HEENT exam was within normal limits, neck was supple without jugular venous distention or carotid bruit, chest was clear to auscultation, heart had a regular rate and rhythm without murmur or gallop, abdomen had normal bowel sounds and no hepatosplenomegaly or tenderness, extremities were without cyanosis, clubbing, or edema. The bilateral pedal pulses were normal. He was alert and  well oriented and could move all extremities reasonably well. He had intact light sensation to both lower extremities.  Assessment/Plan: #1 Diabetes mellitus, type 2:  Blood glucose levels are excellent on current medication. #2 History of Pulmonary Embolism and Anticoagulation:  Clinically stable with the use of Lovenox and air compression hose. Since his last dose of the Xarelto was yesterday morning and the half life is about 9 hours, he should not have significant effect from the Xarelto by tomorrow.  #3 HTN:  Blood pressure is somewhat on the low side despite his moderate back pain, so we will consider stopping diltiazem if his blood pressure remains low. #4 Periooperative Risk: he is at low risk for perioperative cardiovascular morbity or mortality.  Ryan Ogborn G 05/24/2013, 1:36 PM

## 2013-05-24 NOTE — Progress Notes (Signed)
Nutrition Brief Note  Patient identified on the Malnutrition Screening Tool (MST) Report  Pt reports weight loss PTA 315 to 300 lb due to a new diabetes medication which his physician informed him would help him to lose weight. Pt with good appetite. Discussed importance of adequate protein for healing post op.   Wt Readings from Last 15 Encounters:  05/23/13 300 lb (136.079 kg)  04/07/12 296 lb (134.265 kg)  01/22/12 302 lb 6.4 oz (137.168 kg)     Body mass index is 38.5 kg/(m^2). Patient meets criteria for Obesity Class II based on current BMI.   Current diet order is CHO Modified, patient is consuming approximately 100% of meals at this time. Labs and medications reviewed.   No nutrition interventions warranted at this time. If nutrition issues arise, please consult RD.   Kendell Bane RD, LDN, CNSC [phone] Pager [phone] After Hours Pager

## 2013-05-25 ENCOUNTER — Encounter (HOSPITAL_COMMUNITY): Payer: Self-pay | Admitting: Certified Registered"

## 2013-05-25 ENCOUNTER — Inpatient Hospital Stay (HOSPITAL_COMMUNITY): Payer: 59

## 2013-05-25 ENCOUNTER — Inpatient Hospital Stay (HOSPITAL_COMMUNITY): Payer: 59 | Admitting: Anesthesiology

## 2013-05-25 ENCOUNTER — Encounter (HOSPITAL_COMMUNITY): Payer: Self-pay | Admitting: Anesthesiology

## 2013-05-25 ENCOUNTER — Encounter (HOSPITAL_COMMUNITY)
Admission: EM | Disposition: A | Discharge status: Home / Self Care | Payer: 59 | Source: Home / Self Care | Attending: Neurological Surgery

## 2013-05-25 HISTORY — PX: LUMBAR PERCUTANEOUS PEDICLE SCREW 3 LEVEL: SHX5562

## 2013-05-25 LAB — GLUCOSE, CAPILLARY: Glucose-Capillary: 103 mg/dL — ABNORMAL HIGH (ref 70–99)

## 2013-05-25 LAB — PROTIME-INR: INR: 1.01 (ref 0.00–1.49)

## 2013-05-25 SURGERY — LUMBAR PERCUTANEOUS PEDICLE SCREW 3 LEVEL
Anesthesia: General

## 2013-05-25 MED ORDER — OXYCODONE HCL 5 MG PO TABS
ORAL_TABLET | ORAL | Status: AC
  Administered 2013-05-25: 5 mg via ORAL
  Filled 2013-05-25: qty 1

## 2013-05-25 MED ORDER — METHOCARBAMOL 100 MG/ML IJ SOLN: 500.0000 mg | Freq: Four times a day (QID) | INTRAVENOUS | Status: DC | PRN

## 2013-05-25 MED ORDER — BUPIVACAINE HCL (PF) 0.5 % IJ SOLN
INTRAMUSCULAR | Status: DC | PRN
  Administered 2013-05-25: 4.5 mL

## 2013-05-25 MED ORDER — 0.9 % SODIUM CHLORIDE (POUR BTL) OPTIME
TOPICAL | Status: DC | PRN
  Administered 2013-05-25: 1000 mL

## 2013-05-25 MED ORDER — OXYCODONE-ACETAMINOPHEN 10-325 MG PO TABS: 0.5000 | ORAL_TABLET | ORAL | Status: DC | PRN

## 2013-05-25 MED ORDER — DEXAMETHASONE SODIUM PHOSPHATE 4 MG/ML IJ SOLN
INTRAMUSCULAR | Status: DC | PRN
  Administered 2013-05-25: 10 mg via INTRAVENOUS

## 2013-05-25 MED ORDER — OXYCODONE-ACETAMINOPHEN 5-325 MG PO TABS
0.5000 | ORAL_TABLET | ORAL | Status: DC | PRN
  Administered 2013-05-26: 1 via ORAL
  Filled 2013-05-25: qty 1

## 2013-05-25 MED ORDER — SODIUM CHLORIDE 0.9 % IJ SOLN
3.0000 mL | Freq: Two times a day (BID) | INTRAMUSCULAR | Status: DC
  Administered 2013-05-25 – 2013-05-27 (×4): 3 mL via INTRAVENOUS

## 2013-05-25 MED ORDER — PHENOL 1.4 % MT LIQD: 1.0000 | OROMUCOSAL | Status: DC | PRN

## 2013-05-25 MED ORDER — MIDAZOLAM HCL 5 MG/5ML IJ SOLN
INTRAMUSCULAR | Status: DC | PRN
  Administered 2013-05-25: 2 mg via INTRAVENOUS

## 2013-05-25 MED ORDER — CEFAZOLIN SODIUM 1-5 GM-% IV SOLN
1.0000 g | Freq: Three times a day (TID) | INTRAVENOUS | Status: AC
  Administered 2013-05-25 – 2013-05-26 (×2): 1 g via INTRAVENOUS
  Filled 2013-05-25 (×2): qty 50

## 2013-05-25 MED ORDER — ARTIFICIAL TEARS OP OINT
TOPICAL_OINTMENT | OPHTHALMIC | Status: DC | PRN
  Administered 2013-05-25: 1 via OPHTHALMIC

## 2013-05-25 MED ORDER — PROPOFOL 10 MG/ML IV BOLUS
INTRAVENOUS | Status: DC | PRN
  Administered 2013-05-25: 200 mg via INTRAVENOUS

## 2013-05-25 MED ORDER — SODIUM CHLORIDE 0.9 % IV SOLN
INTRAVENOUS | Status: DC
  Administered 2013-05-25: 23:00:00 via INTRAVENOUS

## 2013-05-25 MED ORDER — BISACODYL 10 MG RE SUPP: 10.0000 mg | Freq: Every day | RECTAL | Status: DC | PRN

## 2013-05-25 MED ORDER — ONDANSETRON HCL 4 MG/2ML IJ SOLN
INTRAMUSCULAR | Status: DC | PRN
  Administered 2013-05-25: 4 mg via INTRAVENOUS

## 2013-05-25 MED ORDER — ALUM & MAG HYDROXIDE-SIMETH 200-200-20 MG/5ML PO SUSP: 30.0000 mL | Freq: Four times a day (QID) | ORAL | Status: DC | PRN

## 2013-05-25 MED ORDER — MAGNESIUM CITRATE PO SOLN
1.0000 | Freq: Once | ORAL | Status: AC | PRN
  Filled 2013-05-25: qty 296

## 2013-05-25 MED ORDER — KETOROLAC TROMETHAMINE 15 MG/ML IJ SOLN
15.0000 mg | Freq: Four times a day (QID) | INTRAMUSCULAR | Status: AC
  Administered 2013-05-25 – 2013-05-27 (×5): 15 mg via INTRAVENOUS
  Filled 2013-05-25 (×5): qty 1

## 2013-05-25 MED ORDER — NEOSTIGMINE METHYLSULFATE 1 MG/ML IJ SOLN
INTRAMUSCULAR | Status: DC | PRN
  Administered 2013-05-25: 5 mg via INTRAVENOUS

## 2013-05-25 MED ORDER — HYDROMORPHONE HCL PF 1 MG/ML IJ SOLN
INTRAMUSCULAR | Status: AC
  Administered 2013-05-25: 0.5 mg via INTRAVENOUS
  Filled 2013-05-25: qty 1

## 2013-05-25 MED ORDER — SODIUM CHLORIDE 0.9 % IV SOLN: 250.0000 mL | INTRAVENOUS | Status: DC

## 2013-05-25 MED ORDER — OXYCODONE HCL 5 MG/5ML PO SOLN
5.0000 mg | Freq: Once | ORAL | Status: AC | PRN
Start: 2013-05-25 — End: 2013-05-25

## 2013-05-25 MED ORDER — DIAZEPAM 5 MG PO TABS
ORAL_TABLET | ORAL | Status: AC
  Filled 2013-05-25: qty 1

## 2013-05-25 MED ORDER — THROMBIN 5000 UNITS EX SOLR
CUTANEOUS | Status: DC | PRN
  Administered 2013-05-25 (×2): 5000 [IU] via TOPICAL

## 2013-05-25 MED ORDER — ROCURONIUM BROMIDE 100 MG/10ML IV SOLN
INTRAVENOUS | Status: DC | PRN
  Administered 2013-05-25: 20 mg via INTRAVENOUS
  Administered 2013-05-25 (×3): 10 mg via INTRAVENOUS
  Administered 2013-05-25: 50 mg via INTRAVENOUS

## 2013-05-25 MED ORDER — POLYETHYLENE GLYCOL 3350 17 G PO PACK: 17.0000 g | PACK | Freq: Every day | ORAL | Status: DC | PRN

## 2013-05-25 MED ORDER — LIDOCAINE-EPINEPHRINE 1 %-1:100000 IJ SOLN
INTRAMUSCULAR | Status: DC | PRN
  Administered 2013-05-25: 4.5 mL

## 2013-05-25 MED ORDER — SODIUM CHLORIDE 0.9 % IR SOLN
Status: DC | PRN
  Administered 2013-05-25: 20:00:00

## 2013-05-25 MED ORDER — ONDANSETRON HCL 4 MG/2ML IJ SOLN: 4.0000 mg | Freq: Four times a day (QID) | INTRAMUSCULAR | Status: DC | PRN

## 2013-05-25 MED ORDER — CEFAZOLIN SODIUM 1-5 GM-% IV SOLN
INTRAVENOUS | Status: AC
  Administered 2013-05-25: 1 g via INTRAVENOUS
  Filled 2013-05-25: qty 50

## 2013-05-25 MED ORDER — SENNA 8.6 MG PO TABS: 1.0000 | ORAL_TABLET | Freq: Two times a day (BID) | ORAL | Status: DC

## 2013-05-25 MED ORDER — HEMOSTATIC AGENTS (NO CHARGE) OPTIME
TOPICAL | Status: DC | PRN
  Administered 2013-05-25: 1 via TOPICAL

## 2013-05-25 MED ORDER — SODIUM CHLORIDE 0.9 % IJ SOLN: 3.0000 mL | INTRAMUSCULAR | Status: DC | PRN

## 2013-05-25 MED ORDER — ACETAMINOPHEN 650 MG RE SUPP: 650.0000 mg | RECTAL | Status: DC | PRN

## 2013-05-25 MED ORDER — OXYCODONE HCL 5 MG PO TABS
2.5000 mg | ORAL_TABLET | ORAL | Status: DC | PRN
  Administered 2013-05-25 – 2013-05-27 (×3): 5 mg via ORAL
  Filled 2013-05-25 (×3): qty 1

## 2013-05-25 MED ORDER — DOCUSATE SODIUM 100 MG PO CAPS: 100.0000 mg | ORAL_CAPSULE | Freq: Two times a day (BID) | ORAL | Status: DC

## 2013-05-25 MED ORDER — LIDOCAINE HCL (CARDIAC) 20 MG/ML IV SOLN
INTRAVENOUS | Status: DC | PRN
  Administered 2013-05-25: 100 mg via INTRAVENOUS

## 2013-05-25 MED ORDER — SUCCINYLCHOLINE CHLORIDE 20 MG/ML IJ SOLN
INTRAMUSCULAR | Status: DC | PRN
  Administered 2013-05-25: 100 mg via INTRAVENOUS

## 2013-05-25 MED ORDER — LACTATED RINGERS IV SOLN
INTRAVENOUS | Status: DC | PRN
  Administered 2013-05-25 (×2): via INTRAVENOUS

## 2013-05-25 MED ORDER — MAGNESIUM CITRATE PO SOLN
1.0000 | Freq: Once | ORAL | Status: DC
  Filled 2013-05-25: qty 296

## 2013-05-25 MED ORDER — ONDANSETRON HCL 4 MG/2ML IJ SOLN: 4.0000 mg | INTRAMUSCULAR | Status: DC | PRN

## 2013-05-25 MED ORDER — KETOROLAC TROMETHAMINE 30 MG/ML IJ SOLN
INTRAMUSCULAR | Status: AC
  Administered 2013-05-25: 15 mg
  Filled 2013-05-25: qty 1

## 2013-05-25 MED ORDER — ACETAMINOPHEN 325 MG PO TABS: 650.0000 mg | ORAL_TABLET | ORAL | Status: DC | PRN

## 2013-05-25 MED ORDER — FENTANYL CITRATE 0.05 MG/ML IJ SOLN
INTRAMUSCULAR | Status: DC | PRN
  Administered 2013-05-25: 100 ug via INTRAVENOUS
  Administered 2013-05-25: 50 ug via INTRAVENOUS
  Administered 2013-05-25: 100 ug via INTRAVENOUS
  Administered 2013-05-25: 50 ug via INTRAVENOUS
  Administered 2013-05-25 (×3): 100 ug via INTRAVENOUS
  Administered 2013-05-25: 50 ug via INTRAVENOUS
  Administered 2013-05-25: 100 ug via INTRAVENOUS
  Administered 2013-05-25 (×2): 50 ug via INTRAVENOUS

## 2013-05-25 MED ORDER — OXYCODONE HCL 5 MG PO TABS
5.0000 mg | ORAL_TABLET | Freq: Once | ORAL | Status: AC | PRN
  Administered 2013-05-25: 5 mg via ORAL

## 2013-05-25 MED ORDER — MENTHOL 3 MG MT LOZG: 1.0000 | LOZENGE | OROMUCOSAL | Status: DC | PRN

## 2013-05-25 MED ORDER — HYDROMORPHONE HCL PF 1 MG/ML IJ SOLN
0.2500 mg | INTRAMUSCULAR | Status: DC | PRN
  Administered 2013-05-25 (×4): 0.5 mg via INTRAVENOUS

## 2013-05-25 MED ORDER — METHOCARBAMOL 500 MG PO TABS
500.0000 mg | ORAL_TABLET | Freq: Four times a day (QID) | ORAL | Status: DC | PRN
  Administered 2013-05-27: 500 mg via ORAL
  Filled 2013-05-25 (×2): qty 1

## 2013-05-25 MED ORDER — GLYCOPYRROLATE 0.2 MG/ML IJ SOLN
INTRAMUSCULAR | Status: DC | PRN
  Administered 2013-05-25: .8 mg via INTRAVENOUS

## 2013-05-25 MED ORDER — MORPHINE SULFATE 2 MG/ML IJ SOLN
1.0000 mg | INTRAMUSCULAR | Status: DC | PRN
  Administered 2013-05-25: 2 mg via INTRAVENOUS
  Filled 2013-05-25: qty 1

## 2013-05-25 MED ORDER — CEFAZOLIN SODIUM-DEXTROSE 2-3 GM-% IV SOLR
INTRAVENOUS | Status: AC
  Administered 2013-05-25: 2 g via INTRAVENOUS
  Filled 2013-05-25: qty 50

## 2013-05-25 SURGICAL SUPPLY — 54 items
BAG DECANTER FOR FLEXI CONT (MISCELLANEOUS) ×2 IMPLANT
CLOTH BEACON ORANGE TIMEOUT ST (SAFETY) ×2 IMPLANT
CONT SPEC 4OZ CLIKSEAL STRL BL (MISCELLANEOUS) ×2 IMPLANT
COVER BACK TABLE 24X17X13 BIG (DRAPES) IMPLANT
COVER TABLE BACK 60X90 (DRAPES) ×2 IMPLANT
DERMABOND ADHESIVE PROPEN (GAUZE/BANDAGES/DRESSINGS) ×4
DERMABOND ADVANCED (GAUZE/BANDAGES/DRESSINGS)
DERMABOND ADVANCED .7 DNX12 (GAUZE/BANDAGES/DRESSINGS) IMPLANT
DERMABOND ADVANCED .7 DNX6 (GAUZE/BANDAGES/DRESSINGS) ×4 IMPLANT
DRAPE C-ARM 42X72 X-RAY (DRAPES) ×2 IMPLANT
DRAPE C-ARMOR (DRAPES) ×2 IMPLANT
DRAPE LAPAROTOMY 100X72X124 (DRAPES) ×2 IMPLANT
DRAPE POUCH INSTRU U-SHP 10X18 (DRAPES) ×2 IMPLANT
DURAPREP 26ML APPLICATOR (WOUND CARE) ×2 IMPLANT
ELECT REM PT RETURN 9FT ADLT (ELECTROSURGICAL) ×2
ELECTRODE REM PT RTRN 9FT ADLT (ELECTROSURGICAL) ×1 IMPLANT
GAUZE SPONGE 4X4 16PLY XRAY LF (GAUZE/BANDAGES/DRESSINGS) ×2 IMPLANT
GLOVE BIO SURGEON STRL SZ8.5 (GLOVE) ×2 IMPLANT
GLOVE BIOGEL PI IND STRL 8.5 (GLOVE) ×3 IMPLANT
GLOVE BIOGEL PI INDICATOR 8.5 (GLOVE) ×3
GLOVE ECLIPSE 8.5 STRL (GLOVE) ×4 IMPLANT
GLOVE EXAM NITRILE LRG STRL (GLOVE) IMPLANT
GLOVE EXAM NITRILE MD LF STRL (GLOVE) IMPLANT
GLOVE EXAM NITRILE XL STR (GLOVE) IMPLANT
GLOVE EXAM NITRILE XS STR PU (GLOVE) IMPLANT
GLOVE SS BIOGEL STRL SZ 8 (GLOVE) ×1 IMPLANT
GLOVE SUPERSENSE BIOGEL SZ 8 (GLOVE) ×1
GLOVE SURG SS PI 8.0 STRL IVOR (GLOVE) ×8 IMPLANT
GOWN BRE IMP SLV AUR LG STRL (GOWN DISPOSABLE) IMPLANT
GOWN BRE IMP SLV AUR XL STRL (GOWN DISPOSABLE) ×4 IMPLANT
GOWN STRL REIN 2XL LVL4 (GOWN DISPOSABLE) ×8 IMPLANT
KIT BASIN OR (CUSTOM PROCEDURE TRAY) ×2 IMPLANT
MARKER SKIN DUAL TIP RULER LAB (MISCELLANEOUS) ×2 IMPLANT
NEEDLE HYPO 25X1 1.5 SAFETY (NEEDLE) ×2 IMPLANT
NEEDLE TROCAR TARGETING (NEEDLE) ×4 IMPLANT
NS IRRIG 1000ML POUR BTL (IV SOLUTION) ×2 IMPLANT
PACK LAMINECTOMY NEURO (CUSTOM PROCEDURE TRAY) ×2 IMPLANT
PAD ARMBOARD 7.5X6 YLW CONV (MISCELLANEOUS) ×12 IMPLANT
ROD TI STRT ILLICO 5.5X160 (Rod) ×4 IMPLANT
SCREW CANN PA ILLICO 6.5X45 (Screw) ×2 IMPLANT
SCREW CANN PA ILLICO 6.5X50 (Screw) ×14 IMPLANT
SCREW SET SPINAL STD HEXALOBE (Screw) ×16 IMPLANT
SPONGE LAP 4X18 X RAY DECT (DISPOSABLE) IMPLANT
SPONGE SURGIFOAM ABS GEL SZ50 (HEMOSTASIS) ×2 IMPLANT
SUT VIC AB 1 CT1 18XBRD ANBCTR (SUTURE) ×2 IMPLANT
SUT VIC AB 1 CT1 8-18 (SUTURE) ×2
SUT VIC AB 2-0 CP2 18 (SUTURE) ×4 IMPLANT
SUT VIC AB 3-0 SH 8-18 (SUTURE) ×8 IMPLANT
SYR 20ML ECCENTRIC (SYRINGE) ×2 IMPLANT
TIP TROCAR NITINOL ILLICO 18 (INSTRUMENTS) ×16 IMPLANT
TOWEL OR 17X24 6PK STRL BLUE (TOWEL DISPOSABLE) ×2 IMPLANT
TOWEL OR 17X26 10 PK STRL BLUE (TOWEL DISPOSABLE) ×2 IMPLANT
TRAY FOLEY CATH 14FRSI W/METER (CATHETERS) IMPLANT
WATER STERILE IRR 1000ML POUR (IV SOLUTION) ×2 IMPLANT

## 2013-05-25 NOTE — Anesthesia Postprocedure Evaluation (Signed)
  Anesthesia Post-op Note  Patient: Anthony Case  Procedure(s) Performed: Procedure(s) with comments: Posterior Percutaneous Fixation from Thoracic nine to Lumbar one vertebrae with arthrodesis of Thoracic eleven Fracture (N/A) - Posterior Percutaneous Fixation from Thoracic nine to Lumbar one vertebrae with arthrodesis of Thoracic eleven Fracture  Patient Location: PACU  Anesthesia Type:General  Level of Consciousness: awake  Airway and Oxygen Therapy: Patient Spontanous Breathing  Post-op Pain: mild  Post-op Assessment: Post-op Vital signs reviewed  Post-op Vital Signs: Reviewed  Complications: No apparent anesthesia complications

## 2013-05-25 NOTE — Progress Notes (Signed)
Patient ID: Anthony Case, male   DOB: [birth year], 62 y.o.   MRN: [id number] Patient is eager to get surgery done. Has been off xaralto for two days. Scheduled for this pm.

## 2013-05-25 NOTE — Anesthesia Preprocedure Evaluation (Addendum)
Anesthesia Evaluation  Patient identified by MRN, date of birth, ID band Patient awake    Reviewed: Allergy & Precautions, H&P , NPO status , Patient's Chart, lab work & pertinent test results  Airway Mallampati: IV TM Distance: >3 FB Neck ROM: Full    Dental  (+) Teeth Intact and Dental Advisory Given   Pulmonary sleep apnea and Continuous Positive Airway Pressure Ventilation ,          Cardiovascular hypertension, Pt. on medications + Peripheral Vascular Disease Rhythm:Regular Rate:Normal     Neuro/Psych    GI/Hepatic   Endo/Other  diabetes, Type 2, Oral Hypoglycemic AgentsMorbid obesity  Renal/GU      Musculoskeletal   Abdominal   Peds  Hematology   Anesthesia Other Findings   Reproductive/Obstetrics                          Anesthesia Physical Anesthesia Plan  ASA: III  Anesthesia Plan: General   Post-op Pain Management:    Induction: Intravenous  Airway Management Planned: Oral ETT  Additional Equipment:   Intra-op Plan:   Post-operative Plan: Extubation in OR  Informed Consent: I have reviewed the patients History and Physical, chart, labs and discussed the procedure including the risks, benefits and alternatives for the proposed anesthesia with the patient or authorized representative who has indicated his/her understanding and acceptance.     Plan Discussed with: CRNA, Anesthesiologist and Surgeon  Anesthesia Plan Comments:         Anesthesia Quick Evaluation

## 2013-05-25 NOTE — Transfer of Care (Signed)
Immediate Anesthesia Transfer of Care Note  Patient: Anthony Case  Procedure(s) Performed: Procedure(s) with comments: Posterior Percutaneous Fixation from Thoracic nine to Lumbar one vertebrae with arthrodesis of Thoracic eleven Fracture (N/A) - Posterior Percutaneous Fixation from Thoracic nine to Lumbar one vertebrae with arthrodesis of Thoracic eleven Fracture  Patient Location: PACU  Anesthesia Type:General  Level of Consciousness: awake and patient cooperative  Airway & Oxygen Therapy: Patient Spontanous Breathing and Patient connected to face mask oxygen  Post-op Assessment: Report given to PACU RN and Post -op Vital signs reviewed and stable  Post vital signs: Reviewed and stable  Complications: No apparent anesthesia complications

## 2013-05-25 NOTE — Op Note (Signed)
Date of surgery: 05/25/2013 Preoperative diagnosis: T11 Chance fracture, ankylosing spondylitis Postoperative diagnosis: T11 Chance fracture, ankylosing spondylitis Procedure: Posterior fixation of T11 Chance fracture with segmental fixation from T9-L1 using percutaneous ankle screw placement and fluoroscopic guidance  Surgeon: Barnett Abu Assistant: Tressie Stalker Anesthesia: Gen. endotracheal   indications: Anthony Case is a 62 year old individual who has ankylosing spondylitis. He was moving a refrigerator when he fell backwards striking the ground. He immediately experienced severe and excruciating centralized back pain. After not being able control pain well about 5 or 6 days he came to the emergency room where CT scan demonstrated a presence of a T11 Chance fracture. He has been on anticoagulation in the form of Zaroxolyn. This was stopped. He is now taking to the operating room to undergo percutaneous fixation of the T11 fracture so that it may fused.  Procedure: The patient was brought to the operating room supine on a stretcher. After the smooth induction of general endotracheal anesthesia he was carefully turned prone onto the operating table with the bony prominences being appropriately padded and protected. The back was prepped with alcohol and DuraPrep and draped in a sterile fashion. Fluoroscopic guidance was used to localize pedicle entry sites on the right side in the left side from T9 down to L1. AP and lateral projections were used interchangeably to place a Jamshidi needle into the pedicle first at T9 bilaterally. The Jamshidi needle was inserted to the posterior vertebral wall then the inner cannula was removed and a K wire was passed into the vertebral body. This was done alternating between AP and lateral fluoroscopy to check the position of the trajectories of each of the individual wires was first done at T9 and T10 then at T12 and L1. Once all wires were placed small  incisions were made above and below the each individual wire and a cannula was placed over the K wire with a series of dilators to a 16 mm diameter. Then each individual wire was tapped followed by placing a screw which in every case was a 6.5 x 50 mm pedicle screw. Only one screw at T10 on the left side was 45 mm in length. The rest were all 50 mm in length. Once all 8 screws were placed in the same fashion rod length was measured is felt that 160 mm rod that was straight with best fit between the pedicle screw heads. The rod passing [name] was then used after an incision was made approximately 8 cm below the last screw and the rod was passed percutaneously between all the screw heads. The screw heads were then tightened after using a reduction device on each of the screws to seat the rod into the cradle of the screws. The screws were torqued down to their final resting position. AP and lateral radiographs were again checked to check the fixation. Once the fixation was noted to be good the [name] on each of the pedicle screws were removed. Final radiographs were obtained in AP and lateral projection. The alignment of the vertebrae was noted. At this point the individual incisions for each of the screws and the 2 stab incisions for the rod were closed with 3-0 Vicryl in interrupted fashion. Blood loss was estimated at about 150 cc. Procedure was done in concert with Dr. Delma Officer who helped with placement of the initial K wires and localization into the pedicles of each of the individual trajectories.

## 2013-05-25 NOTE — Anesthesia Procedure Notes (Signed)
Procedure Name: Intubation Date/Time: 05/25/2013 6:20 PM Performed by: Jefm Miles E Pre-anesthesia Checklist: Timeout performed, Patient identified, Emergency Drugs available, Suction available and Patient being monitored Patient Re-evaluated:Patient Re-evaluated prior to inductionOxygen Delivery Method: Circle system utilized Preoxygenation: Pre-oxygenation with 100% oxygen Intubation Type: IV induction Ventilation: Mask ventilation without difficulty and Oral airway inserted - appropriate to patient size Laryngoscope Size: Mac Grade View: Grade I Tube type: Oral Tube size: 7.5 mm Number of attempts: 1 Airway Equipment and Method: Stylet and Video-laryngoscopy Placement Confirmation: ETT inserted through vocal cords under direct vision,  positive ETCO2 and breath sounds checked- equal and bilateral Secured at: 23 cm Tube secured with: Tape Dental Injury: Teeth and Oropharynx as per pre-operative assessment  Difficulty Due To: Difficulty was anticipated, Difficult Airway-  due to neck instability and Difficult Airway- due to large tongue Future Recommendations: Recommend- induction with short-acting agent, and alternative techniques readily available

## 2013-05-25 NOTE — Preoperative (Signed)
Beta Blockers   Reason not to administer Beta Blockers:Not Applicable 

## 2013-05-26 LAB — GLUCOSE, CAPILLARY
Glucose-Capillary: 169 mg/dL — ABNORMAL HIGH (ref 70–99)
Glucose-Capillary: 100 mg/dL — ABNORMAL HIGH (ref 70–99)

## 2013-05-26 MED ORDER — RIVAROXABAN 20 MG PO TABS
20.0000 mg | ORAL_TABLET | Freq: Every day | ORAL | Status: DC
  Administered 2013-05-26: 20 mg via ORAL
  Filled 2013-05-26 (×2): qty 1

## 2013-05-26 NOTE — Progress Notes (Signed)
Physical Therapy Evaluation Patient Details Name: Anthony Case MRN: [id number] DOB: [birth year] Today's Date: 05/26/2013 Time: 7829-5621 PT Time Calculation (min): 29 min  PT Assessment / Plan / Recommendation History of Present Illness  Pt admit with T11 chance fracture with posterior fixation with segment fixation T9-T11.  Clinical Impression  Pt admitted with above. Pt currently with functional limitations due to the deficits listed below (see PT Problem List). Should progress well and go home with HHPT f/u.  May need a RW.   Pt will benefit from skilled PT to increase their independence and safety with mobility to allow discharge to the venue listed below.     PT Assessment  Patient needs continued PT services    Follow Up Recommendations  Home health PT;Supervision/Assistance - 24 hour                Equipment Recommendations  Rolling walker with 5" wheels         Frequency Min 5X/week    Precautions / Restrictions Precautions Precautions: Fall;Back Precaution Booklet Issued: Yes (comment) Required Braces or Orthoses: Spinal Brace Spinal Brace: Applied in sitting position;Applied in standing position;Thoracolumbosacral orthotic Restrictions Weight Bearing Restrictions: No   Pertinent Vitals/Pain VSS, No pain      Mobility  Bed Mobility Bed Mobility: Rolling Right;Right Sidelying to Sit;Sitting - Scoot to [place] of Bed Rolling Right: 4: Min assist Right Sidelying to Sit: 3: Mod assist;With rails;HOB flat Sitting - Scoot to Edge of Bed: 4: Min guard Details for Bed Mobility Assistance: Pt needed education re: log roll but once educated performing well with some assist.   Transfers Transfers: Sit to Stand;Stand to Sit Sit to Stand: 4: Min assist;With upper extremity assist;From bed Stand to Sit: 4: Min assist;With upper extremity assist;With armrests;To chair/3-in-1 Details for Transfer Assistance: Pt needed cues for hand placement.  Placed brace on in sitting  with assist by [employer].  Wife observed.   Ambulation/Gait Ambulation/Gait Assistance: 4: Min assist Ambulation Distance (Feet): 150 Feet Assistive device: None Ambulation/Gait Assistance Details: Pt ambulates with incr gait speed with decr safety as he flexes his upper trunk forward thus decreasing his COG over his body, therefore causing an anterior lean.  His gait is unsteady at times with pt staggering almost seeming that his upper trunk gets ahead of his feet causing postural instability.  May need to use RW for steadiness and safety.   Gait Pattern: Step-through pattern;Decreased stride length;Wide base of support;Trunk flexed Gait velocity: increased Stairs: No Wheelchair Mobility Wheelchair Mobility: No         PT Diagnosis: Generalized weakness  PT Problem List: Decreased activity tolerance;Decreased balance;Decreased mobility;Decreased knowledge of use of DME;Decreased safety awareness;Decreased knowledge of precautions PT Treatment Interventions: DME instruction;Gait training;Functional mobility training;Stair training;Therapeutic activities;Therapeutic exercise;Balance training;Patient/family education     PT Goals(Current goals can be found in the care plan section) Acute Rehab PT Goals Patient Stated Goal: to go home PT Goal Formulation: With patient Time For Goal Achievement: 06/02/13 Potential to Achieve Goals: Good  Visit Information  Last PT Received On: 05/26/13 Assistance Needed: +1 History of Present Illness: Pt admit with T11 chance fracture with posterior fixation with segment fixation T9-T11.       Prior Functioning  Home Living Family/patient expects to be discharged to:: Private residence Living Arrangements: Spouse/significant other Available Help at Discharge: Family;Available 24 hours/day Type of Home: House Home Access: Stairs to enter [employer] of Steps: 2 Entrance Stairs-Rails: None Home Layout: One level Home Equipment: Environmental consultant -  standard;Cane - single point;Shower seat;Wheelchair - manual Prior Function Level of Independence: Independent Communication Communication: No difficulties    Cognition  Cognition Arousal/Alertness: Awake/alert Behavior During Therapy: WFL for tasks assessed/performed Overall Cognitive Status: Within Functional Limits for tasks assessed    Extremity/Trunk Assessment Upper Extremity Assessment Upper Extremity Assessment: Defer to OT evaluation Lower Extremity Assessment Lower Extremity Assessment: Generalized weakness Cervical / Trunk Assessment Cervical / Trunk Assessment: Kyphotic   Balance Balance Balance Assessed: Yes Static Standing Balance Static Standing - Balance Support: No upper extremity supported;During functional activity Static Standing - Level of Assistance: 5: Stand by assistance Static Standing - Comment/# of Minutes: 1  End of Session PT - End of Session Equipment Utilized During Treatment: Gait belt Activity Tolerance: Patient tolerated treatment well Patient left: in chair;with call bell/phone within reach;with family/visitor present Nurse Communication: Mobility status       INGOLD,Carolyn Maniscalco 05/26/2013, 2:00 PM [employer] Acute Rehabilitation [phone] (pager)

## 2013-05-26 NOTE — Clinical Social Work Note (Signed)
Clinical Social Worker received referral for possible ST-SNF placement.  Chart reviewed.  PT/OT recommending home with home health.  Spoke with RN Case Manager who will follow up with patient to discuss home health needs.    CSW signing off - please re consult if social work needs arise.  Jesse Lakaisha Danish, LCSW [phone] 

## 2013-05-26 NOTE — Progress Notes (Signed)
OT Cancellation Note  Patient Details Name: Anthony Case MRN: [id number] DOB: 01-21-1951   Cancelled Treatment:    Reason Eval/Treat Not Completed: Other (comment) Waiting on TLSO in order to eval pt. Will have nsg page [place] when it arrives. [facility] Tykwon Fera, OTR/L  [phone] 05/26/2013 05/26/2013, 10:15 AM

## 2013-05-26 NOTE — Progress Notes (Signed)
Subjective: Patient reports Vital signs are stable and patient feels well offers no significant complaints, feels pain in back is less. Has not been mobilized it  Objective: Vital signs in last 24 hours: Temp:  [97.6 F (36.4 C)-98 F (36.7 C)] 98 F (36.7 C) (10/02 0400) Pulse Rate:  [52-69] 64 (10/02 0600) Resp:  [0-24] 8 (10/02 0600) BP: (131-174)/(62-102) 160/81 mmHg (10/02 0600) SpO2:  [94 %-98 %] 98 % (10/02 0600)  Intake/Output from previous day: 10/01 0701 - 10/02 0700 In: 3536.7 [P.O.:980; I.V.:2456.7; IV Piggyback:100] Out: 3445 [Urine:3345; Blood:100] Intake/Output this shift: Total I/O In: -  Out: 350 [Urine:350]  Incisions are clean and dry. Motor function appears intact in lower extremities.  Lab Results:  Recent Labs  05/23/13 2219  WBC 7.3  HGB 14.9  HCT 43.7  PLT 242   BMET  Recent Labs  05/23/13 2219  CREATININE 0.77    Studies/Results: Dg Thoracolumabar Spine  05/25/2013   *RADIOLOGY REPORT*  Clinical Data: T9 - L1 posterior percutaneous fixation  DG C-ARM 61-120 MIN,THORACOLUMBAR SPINE - 2 VIEW  Comparison:  Thoracic spine CT - 05/23/2013  Findings:  Two spot intraoperative radiographic images of the lower thoracic and upper lumbar spine provided for review.  Spinal labeling is difficult due to coned field of view however there is apparent bilateral paraspinal fusion extending from T9 - L1.  Bilateral pedicular screws are seen and T9, T10, T12 and L1. Alignment appears near anatomic.  No radiopaque foreign body.  IMPRESSION: Post apparent T9 - L1 paraspinal fusion.   Original Report Authenticated By: Tacey Ruiz, MD   Dg C-arm [phone] Min  05/25/2013   *RADIOLOGY REPORT*  Clinical Data: T9 - L1 posterior percutaneous fixation  DG C-ARM 61-120 MIN,THORACOLUMBAR SPINE - 2 VIEW  Comparison:  Thoracic spine CT - 05/23/2013  Findings:  Two spot intraoperative radiographic images of the lower thoracic and upper lumbar spine provided for review.  Spinal  labeling is difficult due to coned field of view however there is apparent bilateral paraspinal fusion extending from T9 - L1.  Bilateral pedicular screws are seen and T9, T10, T12 and L1. Alignment appears near anatomic.  No radiopaque foreign body.  IMPRESSION: Post apparent T9 - L1 paraspinal fusion.   Original Report Authenticated By: Tacey Ruiz, MD    Assessment/Plan: LOS: 3 days  Mobilized with TLSO when it becomes available. Remove Foley.   Kattie Santoyo J 05/26/2013, 9:06 AM

## 2013-05-26 NOTE — Plan of Care (Signed)
Problem: Consults Goal: Diagnosis - Spinal Surgery Outcome: Completed/Met Date Met:  05/26/13 Thoraco/Lumbar Spine Fusion

## 2013-05-26 NOTE — Progress Notes (Signed)
OT EVALUATION  Pt s/p T11 fracture/fixation. PTA,  pt lived with wife independently. Pt making good progress. Pt will benefit from skilled OT services for education on back precautions with nec DME/AE, donning/doffing TLSO and family education to facilitate D/C home.    05/26/13 1400  OT Visit Information  Last OT Received On 05/26/13  Assistance Needed +1  History of Present Illness Pt admit with T11 chance fracture with posterior fixation with segment fixation T9-T11.  Precautions  Precautions Fall;Back  Precaution Booklet Issued Yes (comment)  Required Braces or Orthoses Spinal Brace  Spinal Brace Applied in sitting position;Applied in standing position;TLSO  Home Living  Family/patient expects to be discharged to: Private residence  Living Arrangements Spouse/significant other  Available Help at Discharge Family;Available 24 hours/day  Type of Home House  Home Access Stairs to enter  Entrance Stairs-Number of Steps 2  Entrance Stairs-Rails None  Home Layout One level  Home Equipment Walker - standard;Cane - single point;Shower seat;Wheelchair - manual  Prior Function  Level of Independence Independent  Communication  Communication No difficulties  Cognition  Arousal/Alertness Awake/alert  Behavior During Therapy WFL for tasks assessed/performed  Overall Cognitive Status Within Functional Limits for tasks assessed  Upper Extremity Assessment  Upper Extremity Assessment Overall WFL for tasks assessed  Lower Extremity Assessment  Lower Extremity Assessment Generalized weakness  Cervical / Trunk Assessment  Cervical / Trunk Assessment Kyphotic (forward head)  ADL  Eating/Feeding Modified independent  Where Assessed - Eating/Feeding Chair  Grooming Set up;Supervision/safety  Where Assessed - Grooming Supported sitting  Upper Body Bathing Minimal assistance  Where Assessed - Upper Body Bathing Supported sitting  Lower Body Bathing Moderate assistance  Where Assessed -  Lower Body Bathing Supported sit to stand  Upper Body Dressing Supervision/safety;Set up  Where Assessed - Upper Body Dressing Supported sitting  Lower Body Dressing Moderate assistance  Where Assessed - Lower Body Dressing Supported sit to stand  Toilet Transfer Minimal assistance  Toilet Transfer Method Sit to stand;Other (comment) (ambulatin)  Equipment Used Gait belt;Back brace;Rolling walker  Transfers/Ambulation Related to ADLs min A  ADL Comments Began educating pt on back precuations, AE and compensatory techniques for back precautions  Vision - History  Baseline Vision Wears glasses all the time  Bed Mobility  Bed Mobility Rolling Left;Left Sidelying to Sit  Rolling Right 5: Supervision  Left Sidelying to Sit 4: Min guard  Sitting - Scoot to Edge of Bed 6: Modified independent (Device/Increase time)  Details for Bed Mobility Assistance min vc for log rolling that he learned in earlier PT session.  Transfers  Transfers Sit to Stand;Stand to Sit  Sit to Stand 4: Min assist  Stand to Sit 4: Min assist  Details for Transfer Assistance vc for proper posture  Exercises  Exercises Other exercises  Other Exercises  Other Exercises genealized UE exercises.   Other Exercises Educated on back precautions  OT - End of Session  Equipment Utilized During Treatment Gait belt;Rolling walker;Back brace  Activity Tolerance Patient tolerated treatment well  Patient left in bed;with call bell/phone within reach;with nursing/sitter in room  Nurse Communication Mobility status  OT Assessment  OT Recommendation/Assessment Patient needs continued OT Services  OT Problem List Decreased strength;Decreased range of motion;Decreased activity tolerance;Impaired balance (sitting and/or standing);Decreased knowledge of use of DME or AE;Decreased knowledge of precautions;Cardiopulmonary status limiting activity;Impaired tone;Pain;Increased edema  OT Therapy Diagnosis  Generalized weakness;Acute pain   OT Plan  OT Frequency Min 3X/week  OT Treatment/Interventions Self-care/ADL training;Energy conservation;DME  and/or AE instruction;Therapeutic activities;Patient/family education;Therapeutic exercise  OT Recommendation  Follow Up Recommendations No OT follow up;Supervision/Assistance - 24 hour  OT Equipment None recommended by OT (pt has all equipment)  Individuals Consulted  Consulted and Agree with Results and Recommendations Patient  Acute Rehab OT Goals  Patient Stated Goal to go home  OT Goal Formulation With patient  Time For Goal Achievement 06/09/13  Potential to Achieve Goals Good  OT Time Calculation  OT Start Time 1451  OT Stop Time 1520  OT Time Calculation (min) 29 min  OT General Charges  $OT Visit 1 Procedure  OT Evaluation  $Initial OT Evaluation Tier I 1 Procedure  OT Treatments  $Self Care/Home Management  23-37 mins  [facility], OTR/L  [phone] 05/26/2013

## 2013-05-26 NOTE — Progress Notes (Signed)
Subjective: Back pain is much improved and not having numbness in lower extremities. He wants to have the foley removed  Objective: Vital signs in last 24 hours: Temp:  [97.6 F (36.4 C)-98 F (36.7 C)] 98 F (36.7 C) (10/02 0400) Pulse Rate:  [52-69] 64 (10/02 0600) Resp:  [0-24] 8 (10/02 0600) BP: (131-174)/(62-102) 160/81 mmHg (10/02 0600) SpO2:  [94 %-98 %] 98 % (10/02 0600) Weight change:    Intake/Output from previous day: 10/01 0701 - 10/02 0700 In: 3536.7 [P.O.:980; I.V.:2456.7; IV Piggyback:100] Out: 3445 [Urine:3345; Blood:100]   General appearance: alert, cooperative and no distress Resp: clear to auscultation bilaterally Cardio: regular rate and rhythm GI: soft, non-tender; bowel sounds normal; no masses,  no organomegaly Extremities: extremities normal, atraumatic, no cyanosis or edema  Lab Results:  Recent Labs  05/23/13 2219  WBC 7.3  HGB 14.9  HCT 43.7  PLT 242   BMET  Recent Labs  05/23/13 2219  CREATININE 0.77   CMET CMP     Component Value Date/Time   NA 139 04/09/2012 0610   K 3.8 04/09/2012 0610   CL 105 04/09/2012 0610   CO2 22 04/09/2012 0610   GLUCOSE 197* 04/09/2012 0610   BUN 12 04/09/2012 0610   CREATININE 0.77 05/23/2013 2219   CALCIUM 9.4 04/09/2012 0610   PROT 6.4 05/17/2009 2340   ALBUMIN 3.8 05/17/2009 2340   AST 32 05/17/2009 2340   ALT 45 05/17/2009 2340   ALKPHOS 55 05/17/2009 2340   BILITOT 1.0 05/17/2009 2340   GFRNONAA >90 05/23/2013 2219   GFRAA >90 05/23/2013 2219    CBG (last 3)   Recent Labs  05/24/13 0750 05/25/13 0820 05/25/13 2128  GLUCAP 85 103* 171*    INR RESULTS:   Lab Results  Component Value Date   INR 1.01 05/25/2013   INR 1.04 04/07/2012   INR 1.0 05/17/2009     Studies/Results: Dg Thoracolumabar Spine  05/25/2013   *RADIOLOGY REPORT*  Clinical Data: T9 - L1 posterior percutaneous fixation  DG C-ARM 61-120 MIN,THORACOLUMBAR SPINE - 2 VIEW  Comparison:  Thoracic spine CT - 05/23/2013  Findings:   Two spot intraoperative radiographic images of the lower thoracic and upper lumbar spine provided for review.  Spinal labeling is difficult due to coned field of view however there is apparent bilateral paraspinal fusion extending from T9 - L1.  Bilateral pedicular screws are seen and T9, T10, T12 and L1. Alignment appears near anatomic.  No radiopaque foreign body.  IMPRESSION: Post apparent T9 - L1 paraspinal fusion.   Original Report Authenticated By: Tacey Ruiz, MD   Dg C-arm [phone] Min  05/25/2013   *RADIOLOGY REPORT*  Clinical Data: T9 - L1 posterior percutaneous fixation  DG C-ARM 61-120 MIN,THORACOLUMBAR SPINE - 2 VIEW  Comparison:  Thoracic spine CT - 05/23/2013  Findings:  Two spot intraoperative radiographic images of the lower thoracic and upper lumbar spine provided for review.  Spinal labeling is difficult due to coned field of view however there is apparent bilateral paraspinal fusion extending from T9 - L1.  Bilateral pedicular screws are seen and T9, T10, T12 and L1. Alignment appears near anatomic.  No radiopaque foreign body.  IMPRESSION: Post apparent T9 - L1 paraspinal fusion.   Original Report Authenticated By: Tacey Ruiz, MD    Medications: I have reviewed the patient's current medications.  Assessment/Plan: #1 DM2: under reasonable control on current meds #2 History of PE:  Stable and we can restart Xarelto when considered  safe by his neurosurgeon  LOS: 3 days   Dottie Vaquerano G 05/26/2013, 8:05 AM

## 2013-05-27 ENCOUNTER — Encounter (HOSPITAL_COMMUNITY): Payer: Self-pay | Admitting: Neurological Surgery

## 2013-05-27 LAB — GLUCOSE, CAPILLARY
Glucose-Capillary: 130 mg/dL — ABNORMAL HIGH (ref 70–99)
Glucose-Capillary: 94 mg/dL (ref 70–99)
Glucose-Capillary: 93 mg/dL (ref 70–99)

## 2013-05-27 MED FILL — Heparin Sodium (Porcine) Inj 1000 Unit/ML: INTRAMUSCULAR | Qty: 30 | Status: AC

## 2013-05-27 MED FILL — Sodium Chloride IV Soln 0.9%: INTRAVENOUS | Qty: 1000 | Status: AC

## 2013-05-27 NOTE — Progress Notes (Signed)
Occupational Therapy Treatment Patient Details Name: Anthony Case MRN: [id number] DOB: 05-13-1951 Today's Date: 05/27/2013 Time: 4098-1191 OT Time Calculation (min): 48 min  OT Assessment / Plan / Recommendation  History of present illness Pt admit with T11 chance fracture with posterior fixation with segment fixation T9-T11.   OT comments  Pt has made excellent progress. Feel pt may D/C home when medically stable from OT standpoint.   Follow Up Recommendations  No OT follow up;Supervision/Assistance - 24 hour    Barriers to Discharge       Equipment Recommendations  None recommended by OT    Recommendations for Other Services    Frequency Min 3X/week   Progress towards OT Goals Progress towards OT goals: Progressing toward goals  Plan Discharge plan remains appropriate    Precautions / Restrictions Precautions Precautions: Fall;Back Precaution Booklet Issued: Yes (comment) Precaution Comments: able to recall 3/3 precautions Required Braces or Orthoses: Spinal Brace Spinal Brace: Applied in sitting position;Applied in standing position;Thoracolumbosacral orthotic Restrictions Weight Bearing Restrictions: No   Pertinent Vitals/Pain no apparent distress     ADL  Grooming: Modified independent Where Assessed - Grooming: Unsupported standing Upper Body Bathing: Supervision/safety Where Assessed - Upper Body Bathing: Unsupported sitting Lower Body Bathing: Minimal assistance Where Assessed - Lower Body Bathing: Unsupported sit to stand Upper Body Dressing: Supervision/safety;Set up Where Assessed - Upper Body Dressing: Unsupported sitting Lower Body Dressing: Min guard;Other (comment) (with AE) Where Assessed - Lower Body Dressing: Unsupported sit to stand Toilet Transfer Method: Other (comment) (ambulating) Toileting - Clothing Manipulation and Hygiene: Other (comment) (educated pt on AE for hygiene after toileting) Equipment Used: Back brace;Gait  belt;Long-handled shoe horn;Long-handled sponge;Reacher;Rolling walker;Sock aid;Other (comment) (toilet aid) Transfers/Ambulation Related to ADLs: S ADL Comments: Completed education regarding available AE, use of DME, back precautions, donning/doffing TLSO, home afety and adaptations. Pt demonstrated understanding    OT Diagnosis:    OT Problem List:   OT Treatment Interventions:     OT Goals(current goals can now be found in the care plan section) Acute Rehab OT Goals Patient Stated Goal: to go home OT Goal Formulation: With patient Time For Goal Achievement: 06/09/13 Potential to Achieve Goals: Good ADL Goals Pt Will Perform Grooming: with modified independence;standing Pt Will Perform Lower Body Bathing: with modified independence;sit to/from stand;with adaptive equipment Pt Will Perform Lower Body Dressing: with modified independence;with adaptive equipment;sit to/from stand Pt Will Transfer to Toilet: with modified independence;ambulating;bedside commode Pt Will Perform Toileting - Clothing Manipulation and hygiene: with modified independence;sit to/from stand;with adaptive equipment Pt/caregiver will Perform Home Exercise Program: Increased strength;Right Upper extremity;Left upper extremity;With theraband;Independently Additional ADL Goal #1: independently demosntrate back precautions during ADL  Visit Information  Last OT Received On: 05/27/13 Assistance Needed: +1 History of Present Illness: Pt admit with T11 chance fracture with posterior fixation with segment fixation T9-T11.    Subjective Data      Prior Functioning       Cognition  Cognition Arousal/Alertness: Awake/alert Behavior During Therapy: WFL for tasks assessed/performed Overall Cognitive Status: Within Functional Limits for tasks assessed    Mobility  Bed Mobility Bed Mobility: Not assessed Transfers Transfers: Sit to Stand;Stand to Sit Sit to Stand: 5: Supervision;From chair/3-in-1 Stand to Sit:  5: Supervision;To chair/3-in-1 Details for Transfer Assistance: good carry over    Exercises  Other Exercises Other Exercises: theraband - stressed importance of following back precautions during theraband ex   Balance Balance Balance Assessed:  (WFL for ADL)   End of Session OT -  End of Session Equipment Utilized During Treatment: Gait belt;Rolling walker;Back brace Activity Tolerance: Patient tolerated treatment well Patient left: in chair;with call bell/phone within reach Nurse Communication: Mobility status  GO     Anthony Case,HILLARY 05/27/2013, 11:04 AM Luisa Dago, OTR/L  [phone] 05/27/2013

## 2013-05-27 NOTE — Progress Notes (Signed)
Physical Therapy Treatment Patient Details Name: DAIMEN SHOVLIN MRN: [id number] DOB: 12/29/1950 Today's Date: 05/27/2013 Time: 4098-1191 PT Time Calculation (min): 16 min  PT Assessment / Plan / Recommendation  History of Present Illness Pt admit with T11 chance fracture with posterior fixation with segment fixation T9-T11.   PT Comments   Pt admitted with above. Pt currently with functional limitations due to need for continued education after back surgery.  Pt will benefit from skilled PT to increase their independence and safety with mobility to allow discharge to the venue listed below.   Follow Up Recommendations  Home health PT;Supervision/Assistance - 24 hour                 Equipment Recommendations  Rolling walker with 5" wheels        Frequency Min 5X/week   Progress towards PT Goals Progress towards PT goals: Progressing toward goals  Plan Current plan remains appropriate    Precautions / Restrictions Precautions Precautions: Fall;Back Precaution Booklet Issued: Yes (comment) Required Braces or Orthoses: Spinal Brace Spinal Brace: Applied in sitting position;Applied in standing position;Thoracolumbosacral orthotic Restrictions Weight Bearing Restrictions: No   Pertinent Vitals/Pain VSS, No pain    Mobility  Bed Mobility Bed Mobility: Not assessed Transfers Transfers: Not assessed Ambulation/Gait Ambulation/Gait Assistance: Not tested (comment) Stairs: Yes Stairs Assistance: 4: Min guard Stair Management Technique: Backwards;With walker;Step to pattern Number of Stairs: 2 - Obtained handout and gave to patient re: steps technique. Wheelchair Mobility Wheelchair Mobility: No     PT Goals (current goals can now be found in the care plan section)    Visit Information  Last PT Received On: 05/27/13 Assistance Needed: +1 History of Present Illness: Pt admit with T11 chance fracture with posterior fixation with segment fixation T9-T11.    Subjective  Data  Subjective: "I feel so much better."   Cognition  Cognition Arousal/Alertness: Awake/alert Behavior During Therapy: WFL for tasks assessed/performed Overall Cognitive Status: Within Functional Limits for tasks assessed         End of Session PT - End of Session Equipment Utilized During Treatment: Gait belt Activity Tolerance: Patient tolerated treatment well Patient left: in chair;with call bell/phone within reach;with family/visitor present Nurse Communication: Mobility status        INGOLD,Drucella Karbowski 05/27/2013, 10:21 AM [facility] [phone] (pager)

## 2013-05-27 NOTE — Discharge Summary (Signed)
Physician Discharge Summary  Patient ID: Anthony Case MRN: [id number] DOB/AGE: 63-Dec-1952 63 y.o.  Admit date: 05/23/2013 Discharge date: 05/27/2013  Admission Diagnoses: T11 Chance fracture, chronic anticoagulation secondary to pulmonary emboli  Discharge Diagnoses: T11 Chance fracture, ankylosing spondylitis, chronic anticoagulation secondary to pulmonary emboli Active Problems:   Thoracic spine fracture   Discharged Condition: good  Hospital Course: Patient was admitted to undergo surgical table as a shunt of the T11 Chance fracture. He also needed pain control.  Consults: Internal medicine, Dr. Ivery Quale  Significant Diagnostic Studies: CT scan thoracic spine  Treatments: Fixation of thoracic spine from T9-L1 with reduction of T11 Chance fracture  Discharge Exam: Blood pressure 137/68, pulse 73, temperature 98.1 F (36.7 C), temperature source Oral, resp. rate 18, height 6\' 2"  (1.88 m), weight 136.079 kg (300 lb), SpO2 100.00%. Motor function is intact incisions are clean and dry station and gait is intact.  Disposition: 01-Home or Self Care  Discharge Orders   Future Orders Complete By Expires   Call MD for:  redness, tenderness, or signs of infection (pain, swelling, redness, odor or green/yellow discharge around incision site)  As directed    Call MD for:  severe uncontrolled pain  As directed    Call MD for:  temperature >100.4  As directed    Diet - low sodium heart healthy  As directed    Discharge instructions  As directed    Comments:     Okay to shower. Do not apply salves or appointments to incision. No heavy lifting with the upper extremities greater than 15 pounds. May resume driving when not requiring pain medication and patient feels comfortable with doing so.   Increase activity slowly  As directed        Medication List         diazepam 2 MG tablet  Commonly known as:  VALIUM  Take 2 mg by mouth every 8 (eight) hours as needed (muscle  spasms).     diltiazem 60 MG tablet  Commonly known as:  CARDIZEM  Take 60 mg by mouth daily.     FARXIGA 10 MG Tabs  Generic drug:  Dapagliflozin Propanediol  Take 10 mg by mouth daily.     glipiZIDE 5 MG tablet  Commonly known as:  GLUCOTROL  Take 5 mg by mouth every morning.     HYDROmorphone 4 MG tablet  Commonly known as:  DILAUDID  Take 2-4 mg by mouth every 4 (four) hours as needed for pain.     insulin detemir 100 UNIT/ML injection  Commonly known as:  LEVEMIR  Inject 60 Units into the skin at bedtime.     lovastatin 20 MG tablet  Commonly known as:  MEVACOR  Take 20 mg by mouth every morning.     magnesium citrate Soln  Take 1 Bottle by mouth once.     metFORMIN 500 MG tablet  Commonly known as:  GLUCOPHAGE  Take 1,000 mg by mouth daily with breakfast.     oxyCODONE-acetaminophen 10-325 MG per tablet  Commonly known as:  PERCOCET  Take 0.5-1 tablets by mouth every 4 (four) hours as needed for pain.     Rivaroxaban 20 MG Tabs tablet  Commonly known as:  XARELTO  Take 1 tablet (20 mg total) by mouth daily.         SignedStefani Dama 05/27/2013, 5:18 PM

## 2013-05-27 NOTE — Progress Notes (Signed)
Patient discharged to home accompanied by wife. Discharge instructions given and patient stated understanding. Patients IV was removed and he left in a stable condition via wheelchair.

## 2013-07-29 ENCOUNTER — Encounter: Payer: Self-pay | Admitting: Gastroenterology

## 2013-08-03 ENCOUNTER — Encounter: Payer: Self-pay | Admitting: *Deleted

## 2013-08-03 ENCOUNTER — Telehealth: Payer: Self-pay | Admitting: *Deleted

## 2013-08-03 NOTE — Telephone Encounter (Signed)
Message copied by Derry Skill on Wed Aug 03, 2013  2:41 PM ------      Message from: Maple Hudson      Created: Wed Aug 03, 2013  9:52 AM      Regarding: pt has ov w/ Gunnar Fusi 12/12       Hi Suzzane Quilter,      This is just a note about pt we talked about on phone.  He is scheduled for colonoscopy with Dr. Jarold Motto on 12/17.  He is on Xarelto.  Pt is currently in [place] and will not be back until Friday 12/12 at 3:30.  That is why I made appt  With [facility] for then.  Pt is going to call [name] before visit on Friday and discuss taking Xarelto before procedure.  Pt says he needs to have procedure before the end of the year because of insurance.  i have entered instructions for Moviprep into EP[employer] for you.  i think that is everything we talked about!  Thanks, Olegario Messier ------

## 2013-08-03 NOTE — Telephone Encounter (Signed)
Gave patient information from Ezra Sites RN to Aron Baba , who will be working with Willette Cluster ACNP on Friday for the patients appointment on 08-05-2013.

## 2013-08-05 ENCOUNTER — Ambulatory Visit (INDEPENDENT_AMBULATORY_CARE_PROVIDER_SITE_OTHER): Payer: 59 | Admitting: Nurse Practitioner

## 2013-08-05 ENCOUNTER — Encounter: Payer: Self-pay | Admitting: Nurse Practitioner

## 2013-08-05 VITALS — BP 128/80 | HR 67 | Ht 71.5 in | Wt 282.2 lb

## 2013-08-05 DIAGNOSIS — Z86010 Personal history of colonic polyps: Secondary | ICD-10-CM

## 2013-08-05 NOTE — Progress Notes (Signed)
    HPI :   Patient is a 62 year old male known to Dr. Jarold Case for history of adenomatous colon polyps (August 2009 posterior disease. Patient received his colonoscopy recall letter. He is on Xarelto and needed to be seen prior to proceeding with colonoscopy. Patient is on Xarelto for history of recurrent pulmonary embolisms. He has no gastrointestinal complaints. Bowels are normal, no blood in stool. No abdominal pain. Weight is stable.   Past Medical History  Diagnosis Date  . Hypertension   . Sleep apnea   . Diabetes mellitus   . Arthritis   . Hx pulmonary embolism 2009    required emergent tPA treatment   Family History  Problem Relation Age of Onset  . Other Brother    History  Substance Use Topics  . Smoking status: Never Smoker   . Smokeless tobacco: Never Used  . Alcohol Use: 1.2 oz/week    2 Glasses of wine per week   Current Outpatient Prescriptions  Medication Sig Dispense Refill  . Dapagliflozin Propanediol (FARXIGA) 10 MG TABS Take 10 mg by mouth daily.      Marland Kitchen diltiazem (CARDIZEM) 60 MG tablet Take 60 mg by mouth daily.       Marland Kitchen HYDROmorphone (DILAUDID) 4 MG tablet Take 2-4 mg by mouth every 4 (four) hours as needed for pain.      Marland Kitchen insulin detemir (LEVEMIR) 100 UNIT/ML injection Inject 60 Units into the skin at bedtime.       . lovastatin (MEVACOR) 20 MG tablet Take 20 mg by mouth every morning.      . metFORMIN (GLUCOPHAGE) 500 MG tablet Take 1,000 mg by mouth daily with breakfast.      . Rivaroxaban (XARELTO) 20 MG TABS tablet Take 20 mg by mouth daily. 08/04/13 last dose of xarelto in preparation for colonoscopy       No current facility-administered medications for this visit.   Allergies  Allergen Reactions  . Lisinopril Cough   Review of Systems: All systems reviewed and negative except where noted in HPI.    Physical Exam: BP 128/80  Pulse 67  Ht 5' 11.5" (1.816 m)  Wt 282 lb 4 oz (128.028 kg)  BMI 38.82 kg/m2  SpO2 98% Constitutional:  Pleasant,well-developed, white male in no acute distress. HEENT: Normocephalic and atraumatic. Conjunctivae are normal. No scleral icterus. Neck supple.  Cardiovascular: Normal rate, regular rhythm.  Pulmonary/chest: Effort normal and breath sounds normal. No wheezing, rales or rhonchi. Abdominal: Soft, nondistended, nontender. Bowel sounds active throughout. There are no masses palpable. No hepatomegaly. Extremities: no edema Lymphadenopathy: No cervical adenopathy noted. Neurological: Alert and oriented to person place and time. Skin: Skin is warm and dry. No rashes noted. Psychiatric: Normal mood and affect. Behavior is normal.   ASSESSMENT AND PLAN:  51. 62 year old male with history of adenomatous colon polyps, due for surveillance colonoscopy. The risks, benefits, and alternatives to colonoscopy with possible biopsy and possible polypectomy were discussed with the patient and he consents to proceed.  See #2  2. history of pulmonary embolism, maintained on Xarelto. We have already received written permission from PCP for patient to hold medication prior to colonoscopy.

## 2013-08-05 NOTE — Patient Instructions (Signed)
You have been scheduled for a colonoscopy with propofol. Please follow written instructions given to you at your visit today.  Please pick up your prep kit at the pharmacy within the next 1-3 days. If you use inhalers (even only as needed), please bring them with you on the day of your procedure. Your physician has requested that you go to www.startemmi.com and enter the access code given to you at your visit today. This web site gives a general overview about your procedure. However, you should still follow specific instructions given to you by our office regarding your preparation for the procedure.  WE HAVE GIVEN YOU A SUPREP SAMPLE KIT TODAY

## 2013-08-08 ENCOUNTER — Encounter: Payer: Self-pay | Admitting: Gastroenterology

## 2013-08-10 ENCOUNTER — Ambulatory Visit (AMBULATORY_SURGERY_CENTER): Payer: 59 | Admitting: Gastroenterology

## 2013-08-10 ENCOUNTER — Encounter: Payer: Self-pay | Admitting: Gastroenterology

## 2013-08-10 VITALS — BP 142/86 | HR 63 | Temp 97.1°F | Resp 16 | Ht 71.0 in | Wt 282.0 lb

## 2013-08-10 DIAGNOSIS — Z86010 Personal history of colonic polyps: Secondary | ICD-10-CM

## 2013-08-10 LAB — GLUCOSE, CAPILLARY
Glucose-Capillary: 138 mg/dL — ABNORMAL HIGH (ref 70–99)
Glucose-Capillary: 88 mg/dL (ref 70–99)

## 2013-08-10 MED ORDER — SODIUM CHLORIDE 0.9 % IV SOLN: 500.0000 mL | INTRAVENOUS | Status: DC

## 2013-08-10 NOTE — Patient Instructions (Signed)
YOU HAD AN ENDOSCOPIC PROCEDURE TODAY AT THE [facility]: Refer to the procedure report that was given to you for any specific questions about what was found during the examination.  If the procedure report does not answer your questions, please call your gastroenterologist to clarify.  If you requested that your care partner not be given the details of your procedure findings, then the procedure report has been included in a sealed envelope for you to review at your convenience later.  YOU SHOULD EXPECT: Some feelings of bloating in the abdomen. Passage of more gas than usual.  Walking can help get rid of the air that was put into your GI tract during the procedure and reduce the bloating. If you had a lower endoscopy (such as a colonoscopy or flexible sigmoidoscopy) you may notice spotting of blood in your stool or on the toilet paper. If you underwent a bowel prep for your procedure, then you may not have a normal bowel movement for a few days.  DIET: Your first meal following the procedure should be a light meal and then it is ok to progress to your normal diet.  A half-sandwich or bowl of soup is an example of a good first meal.  Heavy or fried foods are harder to digest and may make you feel nauseous or bloated.  Likewise meals heavy in dairy and vegetables can cause extra gas to form and this can also increase the bloating.  Drink plenty of fluids but you should avoid alcoholic beverages for 24 hours.  ACTIVITY: Your care partner should take you home directly after the procedure.  You should plan to take it easy, moving slowly for the rest of the day.  You can resume normal activity the day after the procedure however you should NOT DRIVE or use heavy machinery for 24 hours (because of the sedation medicines used during the test).    SYMPTOMS TO REPORT IMMEDIATELY: A gastroenterologist can be reached at any hour.  During normal business hours, 8:30 AM to 5:00 PM Monday through Friday,  call [phone].  After hours and on weekends, please call the GI answering service at [phone] who will take a message and have the physician on call contact you.   Following lower endoscopy (colonoscopy or flexible sigmoidoscopy):  Excessive amounts of blood in the stool  Significant tenderness or worsening of abdominal pains  Swelling of the abdomen that is new, acute  Fever of 100F or higher  FOLLOW UP: If any biopsies were taken you will be contacted by phone or by letter within the next 1-3 weeks.  Call your gastroenterologist if you have not heard about the biopsies in 3 weeks.  Our staff will call the home number listed on your records the next business day following your procedure to check on you and address any questions or concerns that you may have at that time regarding the information given to you following your procedure. This is a courtesy call and so if there is no answer at the home number and we have not heard from you through the emergency physician on call, we will assume that you have returned to your regular daily activities without incident.  SIGNATURES/CONFIDENTIALITY: You and/or your care partner have signed paperwork which will be entered into your electronic medical record.  These signatures attest to the fact that that the information above on your After Visit Summary has been reviewed and is understood.  Full responsibility of the confidentiality of this   discharge information lies with you and/or your care-partner.  Resume medications. 

## 2013-08-10 NOTE — Progress Notes (Signed)
Pt. Stable upon discharge, po intake of 4 saltine crackers and 8 oz of grape juice without difficulty. Pt. Alert and oriented skin warm and dry no s/s of acute distress noted upon discharge.

## 2013-08-10 NOTE — Progress Notes (Signed)
Lidocaine-40mg IV prior to Propofol InductionPropofol given over incremental dosages 

## 2013-08-10 NOTE — Op Note (Signed)
[facility] [address] [place], [address]   COLONOSCOPY PROCEDURE REPORT  PATIENT: Anthony, Case  MR#: [id number] BIRTHDATE: 02-05-1951 , 62  yrs. old GENDER: Male ENDOSCOPIST: Mardella Layman, MD, [facility] REFERRED BY: PROCEDURE DATE:  08/10/2013 PROCEDURE:   Colonoscopy, surveillance First Screening Colonoscopy - Avg.  risk and is 50 yrs.  old or older - No.      History of Adenoma - Now for follow-up colonoscopy & has been > or = to 3 yrs.  Yes hx of adenoma.  Has been 3 or more years since last colonoscopy. ASA CLASS:   Class III INDICATIONS:Patient's personal history of colon polyps. MEDICATIONS: propofol (Diprivan) 150mg  IV  DESCRIPTION OF PROCEDURE:   After the risks benefits and alternatives of the procedure were thoroughly explained, informed consent was obtained.  A digital rectal exam revealed no abnormalities of the rectum.   The [id number]  endoscope was introduced through the anus and advanced to the cecum, which was identified by both the appendix and ileocecal valve. No adverse events experienced.   The quality of the prep was excellent, using MoviPrep  The instrument was then slowly withdrawn as the colon was fully examined.      COLON FINDINGS: A normal appearing cecum, ileocecal valve, and appendiceal orifice were identified.  The ascending, hepatic flexure, transverse, splenic flexure, descending, sigmoid colon and rectum appeared unremarkable.  No polyps or cancers were seen. Retroflexed views revealed no abnormalities. The time to cecum=6 minutes 0 seconds.  Withdrawal time=7 minutes 15 seconds.  The scope was withdrawn and the procedure completed. COMPLICATIONS: There were no complications.  ENDOSCOPIC IMPRESSION: Normal colon..no recurrent polyps noted.  RECOMMENDATIONS: 1.  Continue current medications 2.  Repeat Colonoscopy in 5 years.   eSigned:  Mardella Layman, MD, [facility] 08/10/2013 9:49  AM   cc:   PATIENT NAME:  Anthony, Case MR#: [id number]

## 2013-08-11 ENCOUNTER — Telehealth: Payer: Self-pay | Admitting: *Deleted

## 2013-08-11 NOTE — Telephone Encounter (Signed)
Number identifier, left message, follow-up  

## 2013-08-12 ENCOUNTER — Ambulatory Visit (INDEPENDENT_AMBULATORY_CARE_PROVIDER_SITE_OTHER): Payer: 59 | Admitting: Pulmonary Disease

## 2013-08-12 ENCOUNTER — Encounter: Payer: Self-pay | Admitting: Pulmonary Disease

## 2013-08-12 VITALS — BP 140/88 | HR 76 | Temp 98.0°F | Ht 72.0 in | Wt 274.2 lb

## 2013-08-12 DIAGNOSIS — G4730 Sleep apnea, unspecified: Secondary | ICD-10-CM

## 2013-08-12 NOTE — Assessment & Plan Note (Signed)
And has a long history of obstructive sleep apnea, and has done very well in the past on CPAP. His current device is 63 years old, and his mask is falling apart. He has also lost a significant amount of weight most recently, and therefore we should take this opportunity to optimize his pressure again on the automatic setting. Will send an order to his home care company for a new device and mask. I have asked him to keep working on aggressive weight loss, and to followup with me in one year.

## 2013-08-12 NOTE — Progress Notes (Signed)
Subjective:    Patient ID: Anthony Case, male    DOB: July 04, 1951, 62 y.o.   MRN: [id number]  HPI The patient is a 62 year old male who I've been asked to see for management of obstructive sleep apnea. He was initially diagnosed back in the 1990s, and has been on CPAP successfully since that time. His CPAP machine is over 25 years old, and now is not working properly. He also has an old mask that is falling apart, and being held together with tape. The patient has done well with CPAP up until this time, and now is in need of new equipment. He tells me that he has lost 30 pounds most recently, and his Epworth score today is only 3. He denies any intolerance issues with his CPAP.   Sleep Questionnaire What time do you typically go to bed?( Between what hours) 10pm to 12am 10pm to 12am at 1533 on 08/12/13 by Ronny Bacon, CMA How long does it take you to fall asleep? 30-45 minutes 30-45 minutes at 1533 on 08/12/13 by Ronny Bacon, CMA How many times during the night do you wake up? 2 2 at 1533 on 08/12/13 by Ronny Bacon, CMA What time do you get out of bed to start your day? 0630 0630 at 1533 on 08/12/13 by Ronny Bacon, CMA Do you drive or operate heavy machinery in your occupation? Yes Yes at 1533 on 08/12/13 by Ronny Bacon, CMA How much has your weight changed (up or down) over the past two years? (In pounds) 30 lb (13.608 kg) 30 lb (13.608 kg) at 1533 on 08/12/13 by Ronny Bacon, CMA Have you ever had a sleep study before? Yes Yes at 1533 on 08/12/13 by Ronny Bacon, CMA If yes, [facility] at 1533 on 08/12/13 by Ronny Bacon, CMA If yes, date of study? [id number] 1993-1995 at 1533 on 08/12/13 by Ronny Bacon, CMA Do you currently use CPAP? Yes Yes at 1533 on 08/12/13 by Ronny Bacon, CMA If so, what pressure? unsure of current pressure unsure of current pressure at 1533 on 08/12/13 by Ronny Bacon, CMA Do you wear oxygen at any time? No No at 1533 on 08/12/13 by Ronny Bacon, CMA   Review of Systems  Constitutional: Positive for unexpected weight change. Negative for fever.  HENT: Negative for congestion, dental problem, ear pain, nosebleeds, postnasal drip, rhinorrhea, sinus pressure, sneezing, sore throat and trouble swallowing.   Eyes: Negative for redness and itching.  Respiratory: Negative for cough, chest tightness, shortness of breath and wheezing.   Cardiovascular: Negative for palpitations and leg swelling.  Gastrointestinal: Negative for nausea and vomiting.  Genitourinary: Negative for dysuria.  Musculoskeletal: Negative for joint swelling.  Skin: Positive for rash.  Neurological: Negative for headaches.  Hematological: Does not bruise/bleed easily.  Psychiatric/Behavioral: Negative for dysphoric mood. The patient is not nervous/anxious.        Objective:   Physical Exam Constitutional: overweight male, no acute distress  HENT:  Nares patent without discharge  Oropharynx without exudate, palate and uvula are thick and elongated.   Eyes:  Perrla, eomi, no scleral icterus  Neck:  No JVD, no TMG  Cardiovascular:  Normal rate, regular rhythm, no rubs or gallops.  No murmurs        Intact distal pulses  Pulmonary :  Normal breath sounds, no stridor or respiratory distress   No rales, rhonchi, or  wheezing  Abdominal:  Soft, nondistended, bowel sounds present.  No tenderness noted.   Musculoskeletal:  mild lower extremity edema noted.  Lymph Nodes:  No cervical lymphadenopathy noted  Skin:  No cyanosis noted  Neurologic:  Alert, appropriate, moves all 4 extremities without obvious deficit.         Assessment & Plan:

## 2013-08-12 NOTE — Patient Instructions (Signed)
Will send an order to [facility] for a new machine and mask.  Will set on the auto setting for the first few weeks to optimize your pressure.  You will have the choice of staying on auto vs. The fixed pressure.   Work on further weight reduction. followup with me in one year if doing well, but call if having issues.

## 2013-08-15 ENCOUNTER — Other Ambulatory Visit: Payer: Self-pay | Admitting: Pulmonary Disease

## 2013-08-15 DIAGNOSIS — G4730 Sleep apnea, unspecified: Secondary | ICD-10-CM

## 2013-08-16 DIAGNOSIS — G4730 Sleep apnea, unspecified: Secondary | ICD-10-CM

## 2013-08-19 ENCOUNTER — Other Ambulatory Visit: Payer: Self-pay | Admitting: Pulmonary Disease

## 2013-08-19 ENCOUNTER — Encounter: Payer: Self-pay | Admitting: Pulmonary Disease

## 2013-08-19 DIAGNOSIS — G4733 Obstructive sleep apnea (adult) (pediatric): Secondary | ICD-10-CM

## 2014-08-11 ENCOUNTER — Ambulatory Visit: Payer: 59 | Admitting: Pulmonary Disease

## 2015-04-04 ENCOUNTER — Encounter (HOSPITAL_COMMUNITY): Payer: Self-pay | Admitting: Emergency Medicine

## 2015-04-04 ENCOUNTER — Emergency Department (HOSPITAL_COMMUNITY)
Admission: EM | Admit: 2015-04-04 | Discharge: 2015-04-05 | Disposition: A | Discharge status: Home / Self Care | Payer: PPO | Attending: Emergency Medicine | Admitting: Emergency Medicine

## 2015-04-04 DIAGNOSIS — Z794 Long term (current) use of insulin: Secondary | ICD-10-CM | POA: Diagnosis not present

## 2015-04-04 DIAGNOSIS — Z79891 Long term (current) use of opiate analgesic: Secondary | ICD-10-CM | POA: Insufficient documentation

## 2015-04-04 DIAGNOSIS — Z79899 Other long term (current) drug therapy: Secondary | ICD-10-CM | POA: Diagnosis not present

## 2015-04-04 DIAGNOSIS — E119 Type 2 diabetes mellitus without complications: Secondary | ICD-10-CM | POA: Diagnosis not present

## 2015-04-04 DIAGNOSIS — R1032 Left lower quadrant pain: Secondary | ICD-10-CM | POA: Diagnosis present

## 2015-04-04 DIAGNOSIS — I10 Essential (primary) hypertension: Secondary | ICD-10-CM | POA: Diagnosis not present

## 2015-04-04 DIAGNOSIS — Z7901 Long term (current) use of anticoagulants: Secondary | ICD-10-CM | POA: Insufficient documentation

## 2015-04-04 DIAGNOSIS — M1990 Unspecified osteoarthritis, unspecified site: Secondary | ICD-10-CM | POA: Insufficient documentation

## 2015-04-04 DIAGNOSIS — Z86711 Personal history of pulmonary embolism: Secondary | ICD-10-CM | POA: Insufficient documentation

## 2015-04-04 DIAGNOSIS — Z8669 Personal history of other diseases of the nervous system and sense organs: Secondary | ICD-10-CM | POA: Diagnosis not present

## 2015-04-04 DIAGNOSIS — N200 Calculus of kidney: Secondary | ICD-10-CM

## 2015-04-04 LAB — COMPREHENSIVE METABOLIC PANEL
ALT: 20 U/L (ref 17–63)
ANION GAP: 15 (ref 5–15)
AST: 24 U/L (ref 15–41)
Albumin: 3.8 g/dL (ref 3.5–5.0)
Alkaline Phosphatase: 48 U/L (ref 38–126)
BILIRUBIN TOTAL: 0.8 mg/dL (ref 0.3–1.2)
BUN: 12 mg/dL (ref 6–20)
CHLORIDE: 103 mmol/L (ref 101–111)
CO2: 20 mmol/L — AB (ref 22–32)
Calcium: 9.4 mg/dL (ref 8.9–10.3)
Creatinine, Ser: 1.01 mg/dL (ref 0.61–1.24)
GFR calc Af Amer: >60 mL/min (ref >60)
GFR calc non Af Amer: >60 mL/min (ref >60)
Glucose, Bld: 258 mg/dL — ABNORMAL HIGH (ref 65–99)
Potassium: 4.6 mmol/L (ref 3.5–5.1)
Sodium: 138 mmol/L (ref 135–145)
Total Protein: 6.7 g/dL (ref 6.5–8.1)

## 2015-04-04 LAB — CBC
HCT: 45.9 % (ref 39.0–52.0)
Hemoglobin: 15.7 g/dL (ref 13.0–17.0)
MCH: 31.7 pg (ref 26.0–34.0)
MCHC: 34.2 g/dL (ref 30.0–36.0)
MCV: 92.7 fL (ref 78.0–100.0)
PLATELETS: 250 10*3/uL (ref 150–400)
RBC: 4.95 MIL/uL (ref 4.22–5.81)
RDW: 14.1 % (ref 11.5–15.5)
WBC: 8 10*3/uL (ref 4.0–10.5)

## 2015-04-04 LAB — URINALYSIS, ROUTINE W REFLEX MICROSCOPIC
Bilirubin Urine: NEGATIVE
Glucose, UA: >1000 mg/dL — AB
Ketones, ur: 40 mg/dL — AB
NITRITE: NEGATIVE
Protein, ur: 30 mg/dL — AB
SPECIFIC GRAVITY, URINE: 1.033 — AB (ref 1.005–1.030)
UROBILINOGEN UA: 0.2 mg/dL (ref 0.0–1.0)
pH: 5 (ref 5.0–8.0)

## 2015-04-04 LAB — URINE MICROSCOPIC-ADD ON

## 2015-04-04 LAB — LIPASE, BLOOD: LIPASE: 24 U/L (ref 22–51)

## 2015-04-04 LAB — PROTIME-INR
INR: 1.52 — ABNORMAL HIGH (ref 0.00–1.49)
PROTHROMBIN TIME: 18.4 s — AB (ref 11.6–15.2)

## 2015-04-04 NOTE — ED Notes (Signed)
Pt. reports LLQ pain with hematuria , nausea and emesis , denies fever or chills , pt. is taking  Xarelto - history of pulmonary embolism .

## 2015-04-05 MED ORDER — HYDROCODONE-ACETAMINOPHEN 5-325 MG PO TABS: 1.0000 | ORAL_TABLET | Freq: Two times a day (BID) | ORAL | Status: DC | PRN

## 2015-04-05 MED ORDER — ONDANSETRON 4 MG PO TBDP: 4.0000 mg | ORAL_TABLET | Freq: Three times a day (TID) | ORAL | Status: DC | PRN

## 2015-04-05 NOTE — ED Notes (Signed)
Pt. Left belongings and refused wheelchair. Discharge instructions were reviewed and all questions were answered.

## 2015-04-05 NOTE — Discharge Instructions (Signed)
Kidney Stones Mr. Racicot, Your symptoms are consistent with kidney stones. Take pain medication and nausea medicine as needed for your symptoms.  Continue to take your xarelto as this is important in treating your pulmonary embolism.  See urology within 3 days for close follow up.  If any symptoms worsen, come back to the ED immediately. Thank you. Kidney stones (urolithiasis) are solid masses that form inside your kidneys. The intense pain is caused by the stone moving through the kidney, ureter, bladder, and urethra (urinary tract). When the stone moves, the ureter starts to spasm around the stone. The stone is usually passed in your pee (urine).  HOME CARE  Drink enough fluids to keep your pee clear or pale yellow. This helps to get the stone out.  Strain all pee through the provided strainer. Do not pee without peeing through the strainer, not even once. If you pee the stone out, catch it in the strainer. The stone may be as small as a grain of salt. Take this to your doctor. This will help your doctor figure out what you can do to try to prevent more kidney stones.  Only take medicine as told by your doctor.  Follow up with your doctor as told.  Get follow-up X-rays as told by your doctor. GET HELP IF: You have pain that gets worse even if you have been taking pain medicine. GET HELP RIGHT AWAY IF:   Your pain does not get better with medicine.  You have a fever or shaking chills.  Your pain increases and gets worse over 18 hours.  You have new belly (abdominal) pain.  You feel faint or pass out.  You are unable to pee. MAKE SURE YOU:   Understand these instructions.  Will watch your condition.  Will get help right away if you are not doing well or get worse. Document Released: 01/28/2008 Document Revised: 04/13/2013 Document Reviewed: 01/12/2013 [facility] [employer] Patient Information 2015 [place], [place]. This information is not intended to replace advice given to you by your  health care provider. Make sure you discuss any questions you have with your health care provider. Hematuria Hematuria is blood in your urine. It can be caused by a bladder infection, kidney infection, prostate infection, kidney stone, or cancer of your urinary tract. Infections can usually be treated with medicine, and a kidney stone usually will pass through your urine. If neither of these is the cause of your hematuria, further workup to find out the reason may be needed. It is very important that you tell your health care provider about any blood you see in your urine, even if the blood stops without treatment or happens without causing pain. Blood in your urine that happens and then stops and then happens again can be a symptom of a very serious condition. Also, pain is not a symptom in the initial stages of many urinary cancers. HOME CARE INSTRUCTIONS   Drink lots of fluid, 3-4 quarts a day. If you have been diagnosed with an infection, cranberry juice is especially recommended, in addition to large amounts of water.  Avoid caffeine, tea, and carbonated beverages because they tend to irritate the bladder.  Avoid alcohol because it may irritate the prostate.  Take all medicines as directed by your health care provider.  If you were prescribed an antibiotic medicine, finish it all even if you start to feel better.  If you have been diagnosed with a kidney stone, follow your health care provider's instructions regarding straining your  urine to catch the stone.  Empty your bladder often. Avoid holding urine for long periods of time.  After a bowel movement, women should cleanse front to back. Use each tissue only once.  Empty your bladder before and after sexual intercourse if you are a male. SEEK MEDICAL CARE IF:  You develop back pain.  You have a fever.  You have a feeling of sickness in your stomach (nausea) or vomiting.  Your symptoms are not better in 3 days. Return sooner if  you are getting worse. SEEK IMMEDIATE MEDICAL CARE IF:   You develop severe vomiting and are unable to keep the medicine down.  You develop severe back or abdominal pain despite taking your medicines.  You begin passing a large amount of blood or clots in your urine.  You feel extremely weak or faint, or you pass out. MAKE SURE YOU:   Understand these instructions.  Will watch your condition.  Will get help right away if you are not doing well or get worse. Document Released: 08/11/2005 Document Revised: 12/26/2013 Document Reviewed: 04/11/2013 [facility] Patient Information 2015 [place], [place]. This information is not intended to replace advice given to you by your health care provider. Make sure you discuss any questions you have with your health care provider.

## 2015-04-05 NOTE — ED Provider Notes (Signed)
CSN: [id number]     Arrival date & time 04/04/15  2040 History  This chart was scribed for Everlene Balls, MD by Evelene Croon, ED Scribe. This patient was seen in room B19C/B19C and the patient's care was started 12:45 AM.    Chief Complaint  Patient presents with  . Abdominal Pain  . Hematuria    The history is provided by the patient. No language interpreter was used.     HPI Comments:  Anthony Case is a 64 y.o. male who presents to the Emergency Department complaining of sudden onset sharp LLQ abdominal pain that started this evening. He reports associated hematuria and nausea. Pt notes the pain has resolved but he is still noticing blood in his urine.He denies vomiting and fever. Pt is currently on xarelto for PE which he was diagnosed with ~5 years ago. No alleviating factors noted.   Past Medical History  Diagnosis Date  . Hypertension   . Shortness of breath   . Sleep apnea   . Diabetes mellitus   . Arthritis   . Hx pulmonary embolism 2009    required emergent tPA treatment   Past Surgical History  Procedure Laterality Date  . Back surgery    . Elbow arthroscopy  2010  . Colonoscopy  2009    adenoma polyp  . Lumbar percutaneous pedicle screw 3 level N/A 05/25/2013    Procedure: Posterior Percutaneous Fixation from Thoracic nine to Lumbar one vertebrae with arthrodesis of Thoracic eleven Fracture;  Surgeon: Kristeen Miss, MD;  Location: [facility];  Service: Neurosurgery;  Laterality: N/A;  Posterior Percutaneous Fixation from Thoracic nine to Lumbar one vertebrae with arthrodesis of Thoracic eleven Fracture   Family History  Problem Relation Age of Onset  . Heart disease Brother   . Heart disease Father    Social History  Substance Use Topics  . Smoking status: Never Smoker   . Smokeless tobacco: Never Used  . Alcohol Use: Yes    Review of Systems A complete 10 system review of systems was obtained and all systems are negative except as noted in the HPI and  PMH.     Allergies  Lisinopril  Home Medications   Prior to Admission medications   Medication Sig Start Date End Date Taking? Authorizing Provider  Dapagliflozin Propanediol (FARXIGA) 10 MG TABS Take 10 mg by mouth daily.    Historical Provider, MD  diltiazem (CARDIZEM) 60 MG tablet Take 60 mg by mouth daily.     Historical Provider, MD  insulin detemir (LEVEMIR) 100 UNIT/ML injection Inject 40 Units into the skin at bedtime.     Historical Provider, MD  lovastatin (MEVACOR) 20 MG tablet Take 20 mg by mouth every morning.    Historical Provider, MD  metFORMIN (GLUCOPHAGE) 500 MG tablet Take 1,000 mg by mouth daily with breakfast.    Historical Provider, MD  oxyCODONE-acetaminophen (PERCOCET) 5-325 MG per tablet Take 1 tablet by mouth every morning.    Historical Provider, MD  Rivaroxaban (XARELTO) 20 MG TABS tablet Take 20 mg by mouth daily. 08/04/13 last dose of xarelto in preparation for colonoscopy 04/09/12   Leanna Battles, MD   BP 149/79 mmHg  Pulse 66  Temp(Src) 98 F (36.7 C) (Oral)  Resp 20  SpO2 96% Physical Exam  Constitutional: He is oriented to person, place, and time. Vital signs are normal. He appears well-developed and well-nourished.  Non-toxic appearance. He does not appear ill. No distress.  HENT:  Head: Normocephalic and atraumatic.  Nose: Nose normal.  Mouth/Throat: Oropharynx is clear and moist. No oropharyngeal exudate.  Eyes: Conjunctivae and EOM are normal. Pupils are equal, round, and reactive to light. No scleral icterus.  Neck: Normal range of motion. Neck supple. No tracheal deviation, no edema, no erythema and normal range of motion present. No thyroid mass and no thyromegaly present.  Cardiovascular: Normal rate, regular rhythm, S1 normal, S2 normal, normal heart sounds, intact distal pulses and normal pulses.  Exam reveals no gallop and no friction rub.   No murmur heard. Pulses:      Radial pulses are 2+ on the right side, and 2+ on the left side.        Dorsalis pedis pulses are 2+ on the right side, and 2+ on the left side.  Pulmonary/Chest: Effort normal and breath sounds normal. No respiratory distress. He has no wheezes. He has no rhonchi. He has no rales.  Abdominal: Soft. Normal appearance and bowel sounds are normal. He exhibits no distension, no ascites and no mass. There is no hepatosplenomegaly. There is no tenderness. There is no rebound, no guarding and no CVA tenderness.  Genitourinary:  Dry blood seen at urethral meatus   Musculoskeletal: Normal range of motion. He exhibits no edema or tenderness.  Lymphadenopathy:    He has no cervical adenopathy.  Neurological: He is alert and oriented to person, place, and time. He has normal strength. No cranial nerve deficit or sensory deficit.  Skin: Skin is warm, dry and intact. No petechiae and no rash noted. He is not diaphoretic. No erythema. No pallor.  Psychiatric: He has a normal mood and affect. His behavior is normal. Judgment normal.  Nursing note and vitals reviewed.   ED Course  Procedures   DIAGNOSTIC STUDIES:  Oxygen Saturation is 96% on RA, normal by my interpretation.    COORDINATION OF CARE:  12:51 AM Pt updated with partial results. Will discharge with pain meds and referral to urologist. Discussed treatment plan with pt at bedside and pt agreed to plan.  Labs Review Labs Reviewed  COMPREHENSIVE METABOLIC PANEL - Abnormal; Notable for the following:    CO2 20 (*)    Glucose, Bld 258 (*)    All other components within normal limits  URINALYSIS, ROUTINE W REFLEX MICROSCOPIC (NOT AT [facility]) - Abnormal; Notable for the following:    Color, Urine RED (*)    APPearance TURBID (*)    Specific Gravity, Urine 1.033 (*)    Glucose, UA >1000 (*)    Hgb urine dipstick LARGE (*)    Ketones, ur 40 (*)    Protein, ur 30 (*)    Leukocytes, UA SMALL (*)    All other components within normal limits  PROTIME-INR - Abnormal; Notable for the following:    Prothrombin  Time 18.4 (*)    INR 1.52 (*)    All other components within normal limits  URINE MICROSCOPIC-ADD ON - Abnormal; Notable for the following:    Bacteria, UA FEW (*)    All other components within normal limits  LIPASE, BLOOD  CBC    Imaging Review No results found.   EKG Interpretation None      MDM   Final diagnoses:  None   Patient presents to the ED for LLQ pain and hematuria.  He also had nausea. His symptoms are consistent with nephrolithiasis. He likely has more hematuria in the setting of being on Xarelto. Patient was given education and urology follow-up. We'll provide Norco and Zofran  to use at home. He otherwise appears well in no acute distress. His vital signs remain within his normal limits he is safe for discharge.   I personally performed the services described in this documentation, which was scribed in my presence. The recorded information has been reviewed and is accurate.   Everlene Balls, MD 04/05/15 0127

## 2015-10-09 DIAGNOSIS — Z79899 Other long term (current) drug therapy: Secondary | ICD-10-CM | POA: Diagnosis not present

## 2015-10-09 DIAGNOSIS — M2550 Pain in unspecified joint: Secondary | ICD-10-CM | POA: Diagnosis not present

## 2015-10-09 DIAGNOSIS — L4050 Arthropathic psoriasis, unspecified: Secondary | ICD-10-CM | POA: Diagnosis not present

## 2015-10-09 DIAGNOSIS — L409 Psoriasis, unspecified: Secondary | ICD-10-CM | POA: Diagnosis not present

## 2015-10-12 DIAGNOSIS — L4050 Arthropathic psoriasis, unspecified: Secondary | ICD-10-CM | POA: Diagnosis not present

## 2015-10-26 DIAGNOSIS — L4050 Arthropathic psoriasis, unspecified: Secondary | ICD-10-CM | POA: Diagnosis not present

## 2015-11-12 DIAGNOSIS — L4050 Arthropathic psoriasis, unspecified: Secondary | ICD-10-CM | POA: Diagnosis not present

## 2015-11-12 DIAGNOSIS — Z1389 Encounter for screening for other disorder: Secondary | ICD-10-CM | POA: Diagnosis not present

## 2015-11-12 DIAGNOSIS — Z6838 Body mass index (BMI) 38.0-38.9, adult: Secondary | ICD-10-CM | POA: Diagnosis not present

## 2015-11-12 DIAGNOSIS — E1129 Type 2 diabetes mellitus with other diabetic kidney complication: Secondary | ICD-10-CM | POA: Diagnosis not present

## 2015-11-12 DIAGNOSIS — G4733 Obstructive sleep apnea (adult) (pediatric): Secondary | ICD-10-CM | POA: Diagnosis not present

## 2015-11-12 DIAGNOSIS — E668 Other obesity: Secondary | ICD-10-CM | POA: Diagnosis not present

## 2015-11-23 DIAGNOSIS — L4050 Arthropathic psoriasis, unspecified: Secondary | ICD-10-CM | POA: Diagnosis not present

## 2015-12-05 DIAGNOSIS — L409 Psoriasis, unspecified: Secondary | ICD-10-CM | POA: Diagnosis not present

## 2015-12-05 DIAGNOSIS — M2550 Pain in unspecified joint: Secondary | ICD-10-CM | POA: Diagnosis not present

## 2015-12-05 DIAGNOSIS — Z79899 Other long term (current) drug therapy: Secondary | ICD-10-CM | POA: Diagnosis not present

## 2015-12-05 DIAGNOSIS — L4050 Arthropathic psoriasis, unspecified: Secondary | ICD-10-CM | POA: Diagnosis not present

## 2015-12-21 ENCOUNTER — Encounter: Payer: Self-pay | Admitting: Gastroenterology

## 2016-01-16 DIAGNOSIS — Z79899 Other long term (current) drug therapy: Secondary | ICD-10-CM | POA: Diagnosis not present

## 2016-01-16 DIAGNOSIS — L4050 Arthropathic psoriasis, unspecified: Secondary | ICD-10-CM | POA: Diagnosis not present

## 2016-02-28 DIAGNOSIS — G4733 Obstructive sleep apnea (adult) (pediatric): Secondary | ICD-10-CM | POA: Diagnosis not present

## 2016-02-28 DIAGNOSIS — I10 Essential (primary) hypertension: Secondary | ICD-10-CM | POA: Diagnosis not present

## 2016-02-28 DIAGNOSIS — I82402 Acute embolism and thrombosis of unspecified deep veins of left lower extremity: Secondary | ICD-10-CM | POA: Diagnosis not present

## 2016-02-28 DIAGNOSIS — L4050 Arthropathic psoriasis, unspecified: Secondary | ICD-10-CM | POA: Diagnosis not present

## 2016-02-28 DIAGNOSIS — Z6841 Body Mass Index (BMI) 40.0 and over, adult: Secondary | ICD-10-CM | POA: Diagnosis not present

## 2016-02-28 DIAGNOSIS — E1129 Type 2 diabetes mellitus with other diabetic kidney complication: Secondary | ICD-10-CM | POA: Diagnosis not present

## 2016-03-05 DIAGNOSIS — L4050 Arthropathic psoriasis, unspecified: Secondary | ICD-10-CM | POA: Diagnosis not present

## 2016-03-05 DIAGNOSIS — M2550 Pain in unspecified joint: Secondary | ICD-10-CM | POA: Diagnosis not present

## 2016-03-05 DIAGNOSIS — L409 Psoriasis, unspecified: Secondary | ICD-10-CM | POA: Diagnosis not present

## 2016-03-05 DIAGNOSIS — Z79899 Other long term (current) drug therapy: Secondary | ICD-10-CM | POA: Diagnosis not present

## 2016-03-12 DIAGNOSIS — Z79899 Other long term (current) drug therapy: Secondary | ICD-10-CM | POA: Diagnosis not present

## 2016-03-12 DIAGNOSIS — L4050 Arthropathic psoriasis, unspecified: Secondary | ICD-10-CM | POA: Diagnosis not present

## 2016-04-02 ENCOUNTER — Ambulatory Visit (INDEPENDENT_AMBULATORY_CARE_PROVIDER_SITE_OTHER): Payer: Medicare Other | Admitting: Podiatry

## 2016-04-02 ENCOUNTER — Encounter: Payer: Self-pay | Admitting: Podiatry

## 2016-04-02 VITALS — BP 152/91 | HR 89 | Resp 16

## 2016-04-02 DIAGNOSIS — B351 Tinea unguium: Secondary | ICD-10-CM | POA: Diagnosis not present

## 2016-04-02 DIAGNOSIS — M79676 Pain in unspecified toe(s): Secondary | ICD-10-CM | POA: Diagnosis not present

## 2016-04-02 DIAGNOSIS — M79674 Pain in right toe(s): Secondary | ICD-10-CM

## 2016-04-02 DIAGNOSIS — E1149 Type 2 diabetes mellitus with other diabetic neurological complication: Secondary | ICD-10-CM

## 2016-04-02 NOTE — Progress Notes (Signed)
   Subjective:    Patient ID: Anthony Case, male    DOB: 1951/04/20, 65 y.o.   MRN: [id number]  HPI    Review of Systems  Cardiovascular:       Blood clot in legs/lungs  Skin:       Open sores  Hematological:       Blood clotting disorder       Objective:   Physical Exam        Assessment & Plan:

## 2016-04-02 NOTE — Progress Notes (Signed)
Complaint:  Visit Type: Patient presents  to my office for preventative foot care services. Complaint: Patient states" my nails right great toenail was injured and has grown back thick and unsightly. Patient has been diagnosed with DM with diminished LOPS. The patient presents for preventative foot care services.   Podiatric Exam: Vascular: dorsalis pedis and posterior tibial pulses are palpable bilateral. Capillary return is immediate. Temperature gradient is WNL. Skin turgor WNL  Sensorium: Diminished  Semmes Weinstein monofilament test. Normal tactile sensation bilaterally. Nail Exam: Pt has thick disfigured discolored nails with subungual debris noted right entire nail hallux Ulcer Exam: There is no evidence of ulcer or pre-ulcerative changes or infection. Orthopedic Exam: Muscle tone and strength are WNL. No limitations in general ROM. No crepitus or effusions noted. Foot type and digits show no abnormalities. Bony prominences are unremarkable. Skin: No Porokeratosis. No infection or ulcers  Diagnosis:  Onychomycosis, , Pain in right toe,  Treatment & Plan Procedures and Treatment: Consent by patient was obtained for treatment procedures. The patient understood the discussion of treatment and procedures well. All questions were answered thoroughly reviewed.  Initial exam. Debridement of mycotic and hypertrophic toenails, 1 through 5 bilateral and clearing of subungual debris. No ulceration, no infection noted.  Return Visit-Office Procedure: Patient instructed to return to the office for a follow up visit 3 months for continued evaluation and treatment.    Gardiner Barefoot DPM

## 2016-05-24 DIAGNOSIS — Z23 Encounter for immunization: Secondary | ICD-10-CM | POA: Diagnosis not present

## 2016-05-28 DIAGNOSIS — L4050 Arthropathic psoriasis, unspecified: Secondary | ICD-10-CM | POA: Diagnosis not present

## 2016-06-13 DIAGNOSIS — E784 Other hyperlipidemia: Secondary | ICD-10-CM | POA: Diagnosis not present

## 2016-06-13 DIAGNOSIS — Z6841 Body Mass Index (BMI) 40.0 and over, adult: Secondary | ICD-10-CM | POA: Diagnosis not present

## 2016-06-13 DIAGNOSIS — I10 Essential (primary) hypertension: Secondary | ICD-10-CM | POA: Diagnosis not present

## 2016-06-13 DIAGNOSIS — E1129 Type 2 diabetes mellitus with other diabetic kidney complication: Secondary | ICD-10-CM | POA: Diagnosis not present

## 2016-06-13 DIAGNOSIS — L4059 Other psoriatic arthropathy: Secondary | ICD-10-CM | POA: Diagnosis not present

## 2016-06-13 DIAGNOSIS — G4733 Obstructive sleep apnea (adult) (pediatric): Secondary | ICD-10-CM | POA: Diagnosis not present

## 2016-07-04 DIAGNOSIS — Z125 Encounter for screening for malignant neoplasm of prostate: Secondary | ICD-10-CM | POA: Diagnosis not present

## 2016-07-04 DIAGNOSIS — I10 Essential (primary) hypertension: Secondary | ICD-10-CM | POA: Diagnosis not present

## 2016-07-04 DIAGNOSIS — E1129 Type 2 diabetes mellitus with other diabetic kidney complication: Secondary | ICD-10-CM | POA: Diagnosis not present

## 2016-07-04 DIAGNOSIS — E784 Other hyperlipidemia: Secondary | ICD-10-CM | POA: Diagnosis not present

## 2016-07-05 DIAGNOSIS — M25521 Pain in right elbow: Secondary | ICD-10-CM | POA: Diagnosis not present

## 2016-07-05 DIAGNOSIS — M25511 Pain in right shoulder: Secondary | ICD-10-CM | POA: Diagnosis not present

## 2016-07-09 ENCOUNTER — Ambulatory Visit: Payer: Medicare Other | Admitting: Podiatry

## 2016-07-14 DIAGNOSIS — Z6841 Body Mass Index (BMI) 40.0 and over, adult: Secondary | ICD-10-CM | POA: Diagnosis not present

## 2016-07-14 DIAGNOSIS — R2681 Unsteadiness on feet: Secondary | ICD-10-CM | POA: Diagnosis not present

## 2016-07-14 DIAGNOSIS — M459 Ankylosing spondylitis of unspecified sites in spine: Secondary | ICD-10-CM | POA: Diagnosis not present

## 2016-07-14 DIAGNOSIS — L4050 Arthropathic psoriasis, unspecified: Secondary | ICD-10-CM | POA: Diagnosis not present

## 2016-07-14 DIAGNOSIS — G4733 Obstructive sleep apnea (adult) (pediatric): Secondary | ICD-10-CM | POA: Diagnosis not present

## 2016-07-14 DIAGNOSIS — Z0000 Encounter for general adult medical examination without abnormal findings: Secondary | ICD-10-CM | POA: Diagnosis not present

## 2016-07-14 DIAGNOSIS — E1129 Type 2 diabetes mellitus with other diabetic kidney complication: Secondary | ICD-10-CM | POA: Diagnosis not present

## 2016-07-14 DIAGNOSIS — I10 Essential (primary) hypertension: Secondary | ICD-10-CM | POA: Diagnosis not present

## 2016-07-14 DIAGNOSIS — E784 Other hyperlipidemia: Secondary | ICD-10-CM | POA: Diagnosis not present

## 2016-07-14 DIAGNOSIS — E668 Other obesity: Secondary | ICD-10-CM | POA: Diagnosis not present

## 2016-07-16 ENCOUNTER — Encounter: Payer: Self-pay | Admitting: Podiatry

## 2016-07-16 ENCOUNTER — Ambulatory Visit (INDEPENDENT_AMBULATORY_CARE_PROVIDER_SITE_OTHER): Payer: Medicare Other | Admitting: Podiatry

## 2016-07-16 VITALS — Ht 72.0 in | Wt 274.0 lb

## 2016-07-16 DIAGNOSIS — B351 Tinea unguium: Secondary | ICD-10-CM | POA: Diagnosis not present

## 2016-07-16 DIAGNOSIS — E1149 Type 2 diabetes mellitus with other diabetic neurological complication: Secondary | ICD-10-CM

## 2016-07-16 DIAGNOSIS — M79674 Pain in right toe(s): Secondary | ICD-10-CM

## 2016-07-16 NOTE — Progress Notes (Signed)
Complaint:  Visit Type: Patient presents  to my office for preventative foot care services. Complaint: Patient states" my nails left  great toenail was injured and has grown back thick and unsightly. Patient has been diagnosed with DM with diminished LOPS. The patient presents for preventative foot care services.   Podiatric Exam: Vascular: dorsalis pedis and posterior tibial pulses are palpable bilateral. Capillary return is immediate. Temperature gradient is WNL. Skin turgor WNL  Sensorium: Diminished  Semmes Weinstein monofilament test. Normal tactile sensation bilaterally. Nail Exam: Pt has thick disfigured discolored nails with subungual debris noted left  entire nail hallux Ulcer Exam: There is no evidence of ulcer or pre-ulcerative changes or infection. Orthopedic Exam: Muscle tone and strength are WNL. No limitations in general ROM. No crepitus or effusions noted. Foot type and digits show no abnormalities. Bony prominences are unremarkable. Skin: No Porokeratosis. No infection or ulcers  Diagnosis:  Onychomycosis, , Pain in left great toe,  Treatment & Plan Procedures and Treatment: Consent by patient was obtained for treatment procedures. The patient understood the discussion of treatment and procedures well. All questions were answered thoroughly reviewed.  Initial exam. Debridement of mycotic and hypertrophic toenails, 1 through 5 bilateral and clearing of subungual debris. No ulceration, no infection noted.  Return Visit-Office Procedure: Patient instructed to return to the office for a follow up visit 3 months for continued evaluation and treatment.    Gardiner Barefoot DPM

## 2016-07-22 DIAGNOSIS — S42024A Nondisplaced fracture of shaft of right clavicle, initial encounter for closed fracture: Secondary | ICD-10-CM | POA: Diagnosis not present

## 2016-07-22 DIAGNOSIS — L4050 Arthropathic psoriasis, unspecified: Secondary | ICD-10-CM | POA: Diagnosis not present

## 2016-07-31 DIAGNOSIS — E113299 Type 2 diabetes mellitus with mild nonproliferative diabetic retinopathy without macular edema, unspecified eye: Secondary | ICD-10-CM | POA: Diagnosis not present

## 2016-08-01 DIAGNOSIS — Z1212 Encounter for screening for malignant neoplasm of rectum: Secondary | ICD-10-CM | POA: Diagnosis not present

## 2016-08-14 DIAGNOSIS — S42024D Nondisplaced fracture of shaft of right clavicle, subsequent encounter for fracture with routine healing: Secondary | ICD-10-CM | POA: Diagnosis not present

## 2016-08-26 ENCOUNTER — Ambulatory Visit: Payer: Medicare Other | Attending: Internal Medicine

## 2016-08-26 DIAGNOSIS — R293 Abnormal posture: Secondary | ICD-10-CM | POA: Diagnosis not present

## 2016-08-26 DIAGNOSIS — R2689 Other abnormalities of gait and mobility: Secondary | ICD-10-CM | POA: Diagnosis not present

## 2016-08-26 NOTE — Therapy (Signed)
[facility] [address] [place], [place], [address] Phone: [phone]   Fax:  [phone]  Physical Therapy Evaluation  Patient Details  Name: Anthony Case MRN: [id number] Date of Birth: April 30, 1951 Referring Provider: Dr. Philip Aspen  Encounter Date: 08/26/2016      PT End of Session - 08/26/16 1546    Visit Number 1   Number of Visits 17   Date for PT Re-Evaluation 10/25/16   Authorization Type MEDICARE G-CODE AND PROGRESS NOTE EVERY 10TH VISIT. BCBC Supp. ins. secondary   PT Start Time 0847   PT Stop Time 0922   PT Time Calculation (min) 35 min   Equipment Utilized During Treatment Gait belt   Activity Tolerance Patient tolerated treatment well   Behavior During Therapy WFL for tasks assessed/performed      Past Medical History:  Diagnosis Date  . Arthritis   . Diabetes mellitus   . Hx pulmonary embolism 2009   required emergent tPA treatment  . Hypertension   . Shortness of breath   . Sleep apnea     Past Surgical History:  Procedure Laterality Date  . BACK SURGERY    . COLONOSCOPY  2009   adenoma polyp  . ELBOW ARTHROSCOPY  2010  . LUMBAR PERCUTANEOUS PEDICLE SCREW 3 LEVEL N/A 05/25/2013   Procedure: Posterior Percutaneous Fixation from Thoracic nine to Lumbar one vertebrae with arthrodesis of Thoracic eleven Fracture;  Surgeon: Kristeen Miss, MD;  Location: [facility];  Service: Neurosurgery;  Laterality: N/A;  Posterior Percutaneous Fixation from Thoracic nine to Lumbar one vertebrae with arthrodesis of Thoracic eleven Fracture    There were no vitals filed for this visit.       Subjective Assessment - 08/26/16 0855    Subjective Pt reported he has been inactive for approx. 6 weeks 2/2 R clavicle fracture. Pt fell while walking dog (dog chasing animal). Pt denied any restrictions (wt. bearing/precautions) 2/2 R clavicle fx, per his f/u with MD last week. Pt reports he's experienced several  falls over the last few years, 3 of the falls due to Fritz Pickerel (his dog) pulling pt. Pt feels balance and endurance has been impacted due to inactivity. Pt has peripheral neuropathy resulting in constant N/T in B feet, and has a hard time feeling where his feet are in space.   Pertinent History HTN, R clavicle fx (per pt report, denied precautions), hx PE and LLE DVT, hyperlipidemia, DM, OSA, hx of Tx fx, arthritis, recent dx of ankylosing spondylitis per pt   Patient Stated Goals Build endurance (on feet 12 hours at [employer] one a year for 10-12 years), walk further distances, improve balance and strength   Currently in Pain? No/denies            [facility] PT Assessment - 08/26/16 0900      Assessment   Medical Diagnosis Unsteady gait   Referring Provider Dr. Philip Aspen   Onset Date/Surgical Date 07/15/16  with a gradual decline   Hand Dominance Right   Prior Therapy none for balance     Precautions   Precautions Fall     Restrictions   Weight Bearing Restrictions No  per pt     Balance Screen   Has the patient fallen in the past 6 months Yes   How many times? 2   Has the patient had a decrease in activity level because of a fear of falling?  Yes   Is the patient reluctant to leave their  home because of a fear of falling?  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   Leisure Walking dog     Cognition   Overall Cognitive Status Within Functional Limits for tasks assessed     Sensation   Light Touch Impaired by gross assessment   Additional Comments Decr. light touch in B feet     Posture/Postural Control   Posture/Postural Control Postural limitations   Postural Limitations Forward head;Flexed trunk;Increased thoracic kyphosis  2/2 ankylosing spondylitis     ROM / Strength   AROM / PROM / Strength AROM;Strength     AROM   Overall AROM  Within functional limits for tasks performed   Overall AROM Comments B UE/LE AROM WFL.     Strength   Overall Strength Deficits   Overall Strength Comments B LE strength: 4/5 grossly. B hip ext weakness suspected 2/2 gait deviations.      Flexibility   Soft Tissue Assessment /Muscle Length yes   Piriformis B Decr. flexibility      Ambulation/Gait   Ambulation/Gait Yes   Ambulation/Gait Assistance 5: Supervision   Ambulation/Gait Assistance Details No overt LOB during amb. on straight path but incr. sway during turns. pt noted to amb. at [place]. cadence and to use momentum to propel forward.   Ambulation Distance (Feet) 200 Feet   Assistive device None   Gait Pattern Step-through pattern;Decreased stride length;Decreased trunk rotation;Trunk flexed  incr. cadence   Ambulation Surface Level;Indoor   Gait velocity 3.17ft/sec.     Standardized Balance Assessment   Standardized Balance Assessment Dynamic Gait Index     Dynamic Gait Index   Level Surface Mild Impairment   Change in Gait Speed Mild Impairment   Gait with Horizontal Head Turns Mild Impairment   Gait with Vertical Head Turns Mild Impairment   Gait and Pivot Turn Mild Impairment   Step Over Obstacle Normal   Step Around Obstacles Normal   Steps Mild Impairment   Total Score 18                           PT Education - 08/26/16 1537    Education provided Yes    Education Details PT educated pt on outcome measures and PT POC (frequency/duration).    Person(s) Educated Patient   Methods Explanation   Comprehension Verbalized understanding          PT Short Term Goals - 08/26/16 1553      PT SHORT TERM GOAL #1   Title Pt will be IND in HEP to improve strength, balance, endurance, and flexibility. TARGET DATE FOR ALL STGS: 09/23/16   Status New     PT SHORT TERM GOAL #2  Title Pt will amb. 400' over even terrain, IND, without an AD to improve functional mobility.    Status New     PT SHORT TERM GOAL #3   Title Perform 6MWT and write goal as indicated.    Status New     PT SHORT TERM GOAL #4   Title Pt will traveres curb/ramp IND in order to safely walk dog.   Status New           PT Long Term Goals - 08/26/16 1555      PT LONG TERM GOAL #1   Title Pt will verbalize understanding of fall prevention handouts to reduce risk of additional falls. TARGET DATE FOR ALL LTGS: 10/21/16   Status New     PT LONG TERM GOAL #2   Title Pt will improve DGI score to >/=22/24 to decr. falls risk.    Status New     PT LONG TERM GOAL #3   Title Pt will amb. 1000' over even/uneven terrain, IND, in order to improve safety during functional mobility.    Status New     PT LONG TERM GOAL #4   Title Pt will report zero falls over the last 4 weeks to improve safety.   Status New               Plan - 08/26/16 1547    Clinical Impression Statement Pt is a pleasant 65y/o male presenting to OPPT neuro for unsteady gait and s/p R clavicle fx 6 weeks ago. Pt stated he no longer has R UE precautions per MD. Pt's PMH significant for the following: HTN, R clavicle fx (per pt report, denied precautions), hx PE and LLE DVT, hyperlipidemia, DM, OSA, hx of Tx fx, arthritis, recent dx of ankylosing spondylitis per pt. The following impairments were noted during PT exam: gait deviations, decr. safety awareness, decr. endurance, B hip weakness based on pt's  gait deviations, impaired balance, and decr. flexibility. Pt's DGI score indicates pt is at a risk for falls. Pt reported B quad/hamstring fatigue after DGI test, therefore, PT deferred 6MWT to next session. Pt would benfit from skilled PT to improve safety during functional mobility.    Rehab Potential Good   Clinical Impairments Affecting Rehab Potential ankylosing spondylitis (see subjective above for PMH detail)   PT Frequency 2x / week   PT Duration 8 weeks   PT Treatment/Interventions ADLs/Self Care Home Management;Biofeedback;Moist Heat;Cryotherapy;Neuromuscular re-education;Balance training;Therapeutic exercise;Therapeutic activities;Stair training;Functional mobility training;Gait training;DME Instruction;Patient/family education;Orthotic Fit/Training;Vestibular;Manual techniques   PT Next Visit Plan Perform 6MWT and write goal as indicated. Initiate balance, hip strengthening (abd/ext) and flexibility (hamstring/piriformis) HEP. Provide falls prevention handout.   Consulted and Agree with Plan of Care Patient      Patient will benefit from skilled therapeutic intervention in order to improve the following deficits and impairments:  Abnormal gait, Decreased endurance, Decreased knowledge of use of DME, Impaired flexibility, Postural dysfunction, Decreased safety awareness, Decreased range of motion, Decreased balance, Decreased mobility, Decreased strength  Visit Diagnosis: Other abnormalities of gait and mobility - Plan: PT plan of care cert/re-cert  Abnormal posture - Plan: PT plan of care cert/re-cert      G-Codes - [id number]    Functional Assessment Tool Used DGI: 18/24 and gait speed no AD: 3.44ft/sec.   Functional Limitation Mobility: Walking and moving around   Mobility: Walking and Moving Around Current Status [phone]) At least 1 percent but less than 20 percent impaired, limited or restricted   Mobility: Walking  and Moving Around Goal Status [phone]) At least 40 percent  but less than 60 percent impaired, limited or restricted       Problem List Patient Active Problem List   Diagnosis Date Noted  . History of adenomatous polyp of colon 08/05/2013  . Thoracic spine fracture ([place]) 05/23/2013  . Pulmonary embolism ([place]) 04/09/2012    Class: Acute  . Left leg DVT ([place]) 04/09/2012    Class: Acute  . Dyspnea on exertion 04/07/2012    Class: Acute  . Leg edema, left 04/07/2012    Class: Acute  . DIABETES, TYPE 2 04/12/2008  . HYPERLIPIDEMIA 04/12/2008  . HYPERTENSION 04/12/2008  . OSA (obstructive sleep apnea) 04/12/2008    Tove Wideman L 08/26/2016, 3:58 PM  [facility] [address] [name], [place], [address] Phone: [phone]   Fax:  [phone]  Name: KEARNEY WOODRIDGE MRN: [id number] Date of Birth: 1950-11-04  Geoffry Paradise, PT,DPT 08/26/16 4:00 PM Phone: [phone] Fax: [phone]

## 2016-08-28 ENCOUNTER — Ambulatory Visit: Payer: Medicare Other | Admitting: Physical Therapy

## 2016-08-28 ENCOUNTER — Encounter: Payer: Self-pay | Admitting: Physical Therapy

## 2016-08-28 DIAGNOSIS — R2689 Other abnormalities of gait and mobility: Secondary | ICD-10-CM | POA: Diagnosis not present

## 2016-08-28 DIAGNOSIS — R293 Abnormal posture: Secondary | ICD-10-CM | POA: Diagnosis not present

## 2016-08-28 NOTE — Patient Instructions (Addendum)
Chair Sitting    Sit at edge of seat, spine straight, one leg extended. Put a hand on each thigh and bend forward from the hip, keeping spine straight. Allow hand on extended leg to reach toward toes. Support upper body with other arm. Hold _30__ seconds. Repeat 3_ times per session. Do _2-3_ sessions per day.  Copyright  VHI. All rights reserved.   ruCalf / Gastoc: Runners' Stretch I    One leg back and straight, other forward and bent supporting weight, lean forward, gently stretching calf of back leg. Hold __30__ seconds. Repeat with other leg. Repeat __3__ times each leg. Do __1-2__ sessions per day.  Copyright  VHI. All rights reserved.     "I love a Database administrator   At counter for balance as needed: high knee marching forward and then backward. 3 second pauses with each knee lift.  Repeat 3 laps each way. Do _1-2_ sessions per day. http://gt2.exer.us/345   Copyright  VHI. All rights reserved.   Walking on Heels   At counter: Walk on heels forward while continuing on a straight path, and then walk on heels backward to starting position. Repeat for 3 laps each way. Do _1-2__ sessions per day.  Copyright  VHI. All rights reserved.   Walking on Toes   At counter for balance as needed: Walk on toes forward while continuing on a straight path, and then backwards on toes to starting position. Repeat 3 laps each way. Do _1-2___ sessions per day.  Copyright  VHI. All rights reserved.

## 2016-08-28 NOTE — Therapy (Signed)
[facility] [address] [place], [place], [address] Phone: [phone]   Fax:  [phone]  Physical Therapy Treatment  Patient Details  Name: MOUAD DRYDEN MRN: [id number] Date of Birth: September 07, 1950 Referring Provider: Dr. Philip Aspen  Encounter Date: 08/28/2016      PT End of Session - 08/28/16 0851    Visit Number 2   Number of Visits 17   Date for PT Re-Evaluation 10/25/16   Authorization Type MEDICARE G-CODE AND PROGRESS NOTE EVERY 10TH VISIT. BCBC Supp. ins. secondary   PT Start Time [phone]   PT Stop Time 0930   PT Time Calculation (min) 43 min   Equipment Utilized During Treatment Gait belt   Activity Tolerance Patient tolerated treatment well   Behavior During Therapy WFL for tasks assessed/performed      Past Medical History:  Diagnosis Date  . Arthritis   . Diabetes mellitus   . Hx pulmonary embolism 2009   required emergent tPA treatment  . Hypertension   . Shortness of breath   . Sleep apnea     Past Surgical History:  Procedure Laterality Date  . BACK SURGERY    . COLONOSCOPY  2009   adenoma polyp  . ELBOW ARTHROSCOPY  2010  . LUMBAR PERCUTANEOUS PEDICLE SCREW 3 LEVEL N/A 05/25/2013   Procedure: Posterior Percutaneous Fixation from Thoracic nine to Lumbar one vertebrae with arthrodesis of Thoracic eleven Fracture;  Surgeon: Kristeen Miss, MD;  Location: [facility];  Service: Neurosurgery;  Laterality: N/A;  Posterior Percutaneous Fixation from Thoracic nine to Lumbar one vertebrae with arthrodesis of Thoracic eleven Fracture    There were no vitals filed for this visit.      Subjective Assessment - 08/28/16 0851    Subjective No new complaints. No falls or pain to report at this time.    Pertinent History HTN, R clavicle fx (per pt report, denied precautions), hx PE and LLE DVT, hyperlipidemia, DM, OSA, hx of Tx fx, arthritis, recent dx of ankylosing spondylitis per pt   Patient Stated  Goals Build endurance (on feet 12 hours at [employer] one a year for 10-12 years), walk further distances, improve balance and strength   Currently in Pain? No/denies            [facility] PT Assessment - 08/28/16 0852      6 Minute Walk- Baseline   6 Minute Walk- Baseline yes   BP (mmHg) (!)  161/92   HR (bpm) 93   02 Sat (%RA) 95 %   Modified Borg Scale for Dyspnea 0- Nothing at all   Perceived Rate of Exertion (Borg) 6-     6 Minute walk- Post Test   6 Minute Walk Post Test yes   BP (mmHg) (!)  175/95   HR (bpm) 99   02 Sat (%RA) 96 %   Modified Borg Scale for Dyspnea 2- Mild shortness of breath   Perceived Rate of Exertion (Borg) 12-     6 minute walk test results    Aerobic Endurance Distance Walked 1287   Endurance additional comments no device, no rest breaks taken     issued the following to pt's HEP: Chair Sitting    Sit at edge of seat, spine straight, one leg extended. Put a hand on each thigh and bend forward from the hip, keeping spine straight. Allow hand on extended leg to reach toward toes. Support upper body with other arm. Hold _30__ seconds. Repeat 3_ times  per session. Do _2-3_ sessions per day.  Copyright  VHI. All rights reserved.   ruCalf / Gastoc: Runners' Stretch I    One leg back and straight, other forward and bent supporting weight, lean forward, gently stretching calf of back leg. Hold __30__ seconds. Repeat with other leg. Repeat __3__ times each leg. Do __1-2__ sessions per day.  Copyright  VHI. All rights reserved.     "I love a Database administrator   At counter for balance as needed: high knee marching forward and then backward. 3 second pauses with each knee lift.  Repeat 3 laps each way. Do _1-2_ sessions per day. http://gt2.exer.us/345   Copyright  VHI. All rights reserved.   Walking on Heels   At counter: Walk on heels forward while continuing on a straight path, and then walk on heels backward to starting position. Repeat  for 3 laps each way. Do _1-2__ sessions per day.  Copyright  VHI. All rights reserved.   Walking on Toes   At counter for balance as needed: Walk on toes forward while continuing on a straight path, and then backwards on toes to starting position. Repeat 3 laps each way. Do _1-2___ sessions per day.  Copyright  VHI. All rights reserved.         PT Education - 08/28/16 [phone]    Education provided Yes   Education Details results of 6 minute walk test;HEP for LE stretching and balance   Person(s) Educated Patient   Methods Explanation;Demonstration;Verbal cues;Handout   Comprehension Verbalized understanding;Returned demonstration;Verbal cues required;Need further instruction          PT Short Term Goals - 08/26/16 1553      PT SHORT TERM GOAL #1   Title Pt will be IND in HEP to improve strength, balance, endurance, and flexibility. TARGET DATE FOR ALL STGS: 09/23/16   Status New     PT SHORT TERM GOAL #2   Title Pt will amb. 400' over even terrain, IND, without an AD to improve functional mobility.    Status New     PT SHORT TERM GOAL #3   Title Perform 6MWT and write goal as indicated.    Status New     PT SHORT TERM GOAL #4   Title Pt will traveres curb/ramp IND in order to safely walk dog.   Status New           PT Long Term Goals - 08/26/16 1555      PT LONG TERM GOAL #1   Title Pt will verbalize understanding of fall prevention handouts to reduce risk of additional falls. TARGET DATE FOR ALL LTGS: 10/21/16   Status New     PT LONG TERM GOAL #2   Title Pt will improve DGI score to >/=22/24 to decr. falls risk.    Status New     PT LONG TERM GOAL #3   Title Pt will amb. 1000' over even/uneven terrain, IND, in order to improve safety during functional mobility.    Status New     PT LONG TERM GOAL #4   Title Pt will report zero falls over the last 4 weeks to improve safety.   Status New               Plan - 08/28/16 [id number]    Clinical  Impression Statement Todays skilled session focused on establishing baseline values for 6 minute walk test and then HEP to address stretching/flexibility/balance. No issues reported. Pt should benefit from contineud PT  to progress toward goals.    Rehab Potential Good   Clinical Impairments Affecting Rehab Potential ankylosing spondylitis (see subjective above for PMH detail)   PT Frequency 2x / week   PT Duration 8 weeks   PT Treatment/Interventions ADLs/Self Care Home Management;Biofeedback;Moist Heat;Cryotherapy;Neuromuscular re-education;Balance training;Therapeutic exercise;Therapeutic activities;Stair training;Functional mobility training;Gait training;DME Instruction;Patient/family education;Orthotic Fit/Training;Vestibular;Manual techniques   PT Next Visit Plan provide falls prevention handout. Continued to work on balance, hip strengthening (abd/ext) and flexibility (hamstring/piriformis);advance HEP as needed   Consulted and Agree with Plan of Care Patient      Patient will benefit from skilled therapeutic intervention in order to improve the following deficits and impairments:  Abnormal gait, Decreased endurance, Decreased knowledge of use of DME, Impaired flexibility, Postural dysfunction, Decreased safety awareness, Decreased range of motion, Decreased balance, Decreased mobility, Decreased strength  Visit Diagnosis: Other abnormalities of gait and mobility  Abnormal posture     Problem List Patient Active Problem List   Diagnosis Date Noted  . History of adenomatous polyp of colon 08/05/2013  . Thoracic spine fracture ([place]) 05/23/2013  . Pulmonary embolism ([place]) 04/09/2012    Class: Acute  . Left leg DVT ([place]) 04/09/2012    Class: Acute  . Dyspnea on exertion 04/07/2012    Class: Acute  . Leg edema, left 04/07/2012    Class: Acute  . DIABETES, TYPE 2 04/12/2008  . HYPERLIPIDEMIA 04/12/2008  . HYPERTENSION 04/12/2008  . OSA (obstructive sleep apnea) 04/12/2008     Willow Ora, PTA, [facility] [address], [place], [place] [address] [phone] 08/28/16, 10:38 PM   Name: KIM BOLON MRN: [id number] Date of Birth: 1950/11/24

## 2016-09-02 ENCOUNTER — Ambulatory Visit: Payer: Medicare Other

## 2016-09-02 VITALS — BP 124/78 | HR 84

## 2016-09-02 DIAGNOSIS — R2689 Other abnormalities of gait and mobility: Secondary | ICD-10-CM

## 2016-09-02 DIAGNOSIS — R293 Abnormal posture: Secondary | ICD-10-CM | POA: Diagnosis not present

## 2016-09-02 NOTE — Patient Instructions (Addendum)
Fall Prevention in the Home Falls can cause injuries and can affect people from all age groups. There are many simple things that you can do to make your home safe and to help prevent falls. What can I do on the outside of my home?  Regularly repair the edges of walkways and driveways and fix any cracks.  Remove high doorway thresholds.  Trim any shrubbery on the main path into your home.  Use bright outdoor lighting.  Clear walkways of debris and clutter, including tools and rocks.  Regularly check that handrails are securely fastened and in good repair. Both sides of any steps should have handrails.  Install guardrails along the edges of any raised decks or porches.  Have leaves, snow, and ice cleared regularly.  Use sand or salt on walkways during winter months.  In the garage, clean up any spills right away, including grease or oil spills. What can I do in the bathroom?  Use night lights.  Install grab bars by the toilet and in the tub and shower. Do not use towel bars as grab bars.  Use non-skid mats or decals on the floor of the tub or shower.  If you need to sit down while you are in the shower, use a plastic, non-slip stool.  Keep the floor dry. Immediately clean up any water that spills on the floor.  Remove soap buildup in the tub or shower on a regular basis.  Attach bath mats securely with double-sided non-slip rug tape.  Remove throw rugs and other tripping hazards from the floor. What can I do in the bedroom?  Use night lights.  Make sure that a bedside light is easy to reach.  Do not use oversized bedding that drapes onto the floor.  Have a firm chair that has side arms to use for getting dressed.  Remove throw rugs and other tripping hazards from the floor. What can I do in the kitchen?  Clean up any spills right away.  Avoid walking on wet floors.  Place frequently used items in easy-to-reach places.  If you need to reach for something above  you, use a sturdy step stool that has a grab bar.  Keep electrical cables out of the way.  Do not use floor polish or wax that makes floors slippery. If you have to use wax, make sure that it is non-skid floor wax.  Remove throw rugs and other tripping hazards from the floor. What can I do in the stairways?  Do not leave any items on the stairs.  Make sure that there are handrails on both sides of the stairs. Fix handrails that are broken or loose. Make sure that handrails are as long as the stairways.  Check any carpeting to make sure that it is firmly attached to the stairs. Fix any carpet that is loose or worn.  Avoid having throw rugs at the top or bottom of stairways, or secure the rugs with carpet tape to prevent them from moving.  Make sure that you have a light switch at the top of the stairs and the bottom of the stairs. If you do not have them, have them installed. What are some other fall prevention tips?  Wear closed-toe shoes that fit well and support your feet. Wear shoes that have rubber soles or low heels.  When you use a stepladder, make sure that it is completely opened and that the sides are firmly locked. Have someone hold the ladder while you are using   it. Do not climb a closed stepladder.  Add color or contrast paint or tape to grab bars and handrails in your home. Place contrasting color strips on the first and last steps.  Use mobility aids as needed, such as canes, walkers, scooters, and crutches.  Turn on lights if it is dark. Replace any light bulbs that burn out.  Set up furniture so that there are clear paths. Keep the furniture in the same spot.  Fix any uneven floor surfaces.  Choose a carpet design that does not hide the edge of steps of a stairway.  Be aware of any and all pets.  Review your medicines with your healthcare provider. Some medicines can cause dizziness or changes in blood pressure, which increase your risk of falling. Talk with  your health care provider about other ways that you can decrease your risk of falls. This may include working with a physical therapist or trainer to improve your strength, balance, and endurance. This information is not intended to replace advice given to you by your health care provider. Make sure you discuss any questions you have with your health care provider. Document Released: 08/01/2002 Document Revised: 01/08/2016 Document Reviewed: 09/15/2014 Elsevier Interactive Patient Education  2017 Elsevier Inc.   Functional Quadriceps: Sit to Stand    Sit on edge of chair, feet flat on floor. Stand upright, extending knees fully. Repeat __10__ times per set. Do __3__ sets per session. Do __3-4__ sessions per week.  http://orth.exer.us/734   Copyright  VHI. All rights reserved.   Abduction: Clam (Eccentric) - Side-Lying    Tie green band above knees and lie on side with knees bent. Lift top knee, keeping feet together. Keep trunk steady. Slowly lower leg. _10__ reps per set, __3_ sets per day, __3-4_ days per week. Repeat on other side.   http://ecce.exer.us/64   Copyright  VHI. All rights reserved.   Bridge    Lying on back, legs bent 90, feet flat on floor. Press up hips and torso, hold for 2 seconds and then slowly lower back down. Perform 3 sets of 10 reps. 3-4 days a week.  Copyright  VHI. All rights reserved.   Perform at counter with hand support as needed: Backward    Walk backwards with eyes open. Take even steps, making sure each foot lifts off floor. Repeat for __4__ laps per session. Do __1__ sessions per day.  Copyright  VHI. All rights reserved.

## 2016-09-02 NOTE — Therapy (Signed)
[facility] [address] [place], [place], [address] Phone: [phone]   Fax:  [phone]  Physical Therapy Treatment  Patient Details  Name: Anthony Case MRN: [id number] Date of Birth: 1951-07-24 Referring Provider: Dr. Philip Aspen  Encounter Date: 09/02/2016      PT End of Session - 09/02/16 1359    Visit Number 3   Number of Visits 17   Date for PT Re-Evaluation 10/25/16   Authorization Type MEDICARE G-CODE AND PROGRESS NOTE EVERY 10TH VISIT. BCBC Supp. ins. secondary   PT Start Time 1316   PT Stop Time 1356   PT Time Calculation (min) 40 min   Activity Tolerance Patient tolerated treatment well   Behavior During Therapy WFL for tasks assessed/performed      Past Medical History:  Diagnosis Date  . Arthritis   . Diabetes mellitus   . Hx pulmonary embolism 2009   required emergent tPA treatment  . Hypertension   . Shortness of breath   . Sleep apnea     Past Surgical History:  Procedure Laterality Date  . BACK SURGERY    . COLONOSCOPY  2009   adenoma polyp  . ELBOW ARTHROSCOPY  2010  . LUMBAR PERCUTANEOUS PEDICLE SCREW 3 LEVEL N/A 05/25/2013   Procedure: Posterior Percutaneous Fixation from Thoracic nine to Lumbar one vertebrae with arthrodesis of Thoracic eleven Fracture;  Surgeon: Kristeen Miss, MD;  Location: [facility];  Service: Neurosurgery;  Laterality: N/A;  Posterior Percutaneous Fixation from Thoracic nine to Lumbar one vertebrae with arthrodesis of Thoracic eleven Fracture    Vitals:   09/02/16 1322 09/02/16 1348  BP: (!) 148/83 124/78  Pulse: 98 84        Subjective Assessment - 09/02/16 1319    Subjective Pt denied falls since last visit. Pt reported the flu went through his house but he took Tamiful and only had nasal congestion.    Pertinent History HTN, R clavicle fx (per pt report, denied precautions), hx PE and LLE DVT, hyperlipidemia, DM, OSA, hx of Tx fx, arthritis, recent  dx of ankylosing spondylitis per pt   Patient Stated Goals Build endurance (on feet 12 hours at [employer] one a year for 10-12 years), walk further distances, improve balance and strength   Currently in Pain? No/denies                 Therex and neuro re-ed (2 minutes of neuro re-ed, backwards amb.): Pt performed strengthening and balance HEP, with S and cues for technique. Please see pt instructions for details.          Self care:     PT Education - 09/02/16 1358    Education provided Yes   Education Details PT provided pt with strengthening and balance HEP. PT educated pt on fall prevention strategies.   Person(s) Educated Patient   Methods Explanation;Tactile cues;Verbal cues;Handout;Demonstration   Comprehension Verbalized understanding;Returned demonstration          PT Short Term Goals - 09/02/16 1401      PT SHORT TERM GOAL #1   Title Pt will be IND in HEP to improve strength, balance, endurance, and flexibility. TARGET DATE FOR ALL STGS: 09/23/16   Status New     PT SHORT TERM GOAL #2   Title Pt will amb. 400' over even terrain, IND, without an AD to improve functional mobility.    Status New     PT SHORT TERM GOAL #3   Title  Perform 6MWT and write goal as indicated.    Status Achieved     PT SHORT TERM GOAL #4   Title Pt will traveres curb/ramp IND in order to safely walk dog.   Status New           PT Long Term Goals - 09/02/16 1400      PT LONG TERM GOAL #1   Title Pt will verbalize understanding of fall prevention handouts to reduce risk of additional falls. TARGET DATE FOR ALL LTGS: 10/21/16   Status New     PT LONG TERM GOAL #2   Title Pt will improve DGI score to >/=22/24 to decr. falls risk.    Status New     PT LONG TERM GOAL #3   Title Pt will amb. 1000' over even/uneven terrain, IND, in order to improve safety during functional mobility.    Status New     PT LONG TERM GOAL #4   Title Pt will report zero falls over  the last 4 weeks to improve safety.   Status New     PT LONG TERM GOAL #5   Title Pt will perform 6MWT >/=1487' to improve endurance.    Status New               Plan - 09/02/16 1359    Clinical Impression Statement Today's skilled session focused on functional stregthening and balance HEP. Pt tolerated strengthening HEP well but did require cues for proper technique. Pt's BP was lower today than previous session and pt will continue to monitor, pt has been sick and this may explain elevated BP last session. Continue with POC.    Rehab Potential Good   Clinical Impairments Affecting Rehab Potential ankylosing spondylitis (see subjective above for PMH detail)   PT Frequency 2x / week   PT Duration 8 weeks   PT Treatment/Interventions ADLs/Self Care Home Management;Biofeedback;Moist Heat;Cryotherapy;Neuromuscular re-education;Balance training;Therapeutic exercise;Therapeutic activities;Stair training;Functional mobility training;Gait training;DME Instruction;Patient/family education;Orthotic Fit/Training;Vestibular;Manual techniques   PT Next Visit Plan Gait and balance over uneven terrain.   Consulted and Agree with Plan of Care Patient      Patient will benefit from skilled therapeutic intervention in order to improve the following deficits and impairments:  Abnormal gait, Decreased endurance, Decreased knowledge of use of DME, Impaired flexibility, Postural dysfunction, Decreased safety awareness, Decreased range of motion, Decreased balance, Decreased mobility, Decreased strength  Visit Diagnosis: Other abnormalities of gait and mobility     Problem List Patient Active Problem List   Diagnosis Date Noted  . History of adenomatous polyp of colon 08/05/2013  . Thoracic spine fracture ([place]) 05/23/2013  . Pulmonary embolism ([place]) 04/09/2012    Class: Acute  . Left leg DVT ([place]) 04/09/2012    Class: Acute  . Dyspnea on exertion 04/07/2012    Class: Acute  . Leg edema, left  04/07/2012    Class: Acute  . DIABETES, TYPE 2 04/12/2008  . HYPERLIPIDEMIA 04/12/2008  . HYPERTENSION 04/12/2008  . OSA (obstructive sleep apnea) 04/12/2008    Paulette Lynch L 09/02/2016, 2:02 PM  [facility] [address] [place], [place], [address] Phone: [phone]   Fax:  [phone]  Name: LINDBURG BANNER MRN: [id number] Date of Birth: [birth year]   Geoffry Paradise, PT,DPT 09/02/16 2:03 PM Phone: [phone] Fax: [phone]

## 2016-09-04 ENCOUNTER — Ambulatory Visit: Payer: Medicare Other | Admitting: Physical Therapy

## 2016-09-15 ENCOUNTER — Ambulatory Visit: Payer: Medicare Other | Admitting: Physical Therapy

## 2016-09-15 DIAGNOSIS — R293 Abnormal posture: Secondary | ICD-10-CM

## 2016-09-15 DIAGNOSIS — R2689 Other abnormalities of gait and mobility: Secondary | ICD-10-CM | POA: Diagnosis not present

## 2016-09-15 NOTE — Patient Instructions (Addendum)
Balance: Eyes Closed - Bilateral (Varied Surfaces)    Stand on pillows, feet shoulder width, close eyes. Maintain balance 30   seconds. Repeat  3  times per set. Do 1   sets per session. Do  5 days per week. Repeat on compliant surface: foam.  Copyright  VHI. All rights reserved.     Feet Apart (Compliant Surface) Head Motion - Eyes Closed    Stand on compliant surface:    with feet shoulder width apart. Close eyes and move head slowly, up and down. Repeat  10  times per session. Do   1  sessions per day, 5 days per week  Copyright  VHI. All rights reserved.      Feet Together (Compliant Surface) Head Motion - Eyes Closed    Stand on compliant surface:    with feet together. Close eyes and move head slowly, up and down. Repeat 10   times per session. Do  1  sessions per day, 5 days per week  Copyright  VHI. All rights reserved.     Feet Partial Heel-Toe (Compliant Surface) Head Motion - Eyes Closed    Stand on compliant surface:    with right foot partially in front of the other. Close eyes and move head slowly, up and down. After 10 reps, switch feet to left foot in front. Repeat  10  times per session. Do   1 sessions per day, 5 days per week  Copyright  VHI. All rights reserved.

## 2016-09-15 NOTE — Therapy (Signed)
[facility] [address] [place], [place], [address] Phone: [phone]   Fax:  [phone]  Physical Therapy Treatment  Patient Details  Name: Anthony Case MRN: [id number] Date of Birth: June 12, 1951 Referring Provider: Dr. Philip Aspen  Encounter Date: 09/15/2016      PT End of Session - 09/15/16 2024    Visit Number 4   Number of Visits 17   Date for PT Re-Evaluation 10/25/16   Authorization Type MEDICARE G-CODE AND PROGRESS NOTE EVERY 10TH VISIT. BCBC Supp. ins. secondary   PT Start Time 0812   PT Stop Time 0853   PT Time Calculation (min) 41 min   Equipment Utilized During Treatment Gait belt   Activity Tolerance Patient tolerated treatment well   Behavior During Therapy WFL for tasks assessed/performed      Past Medical History:  Diagnosis Date  . Arthritis   . Diabetes mellitus   . Hx pulmonary embolism 2009   required emergent tPA treatment  . Hypertension   . Shortness of breath   . Sleep apnea     Past Surgical History:  Procedure Laterality Date  . BACK SURGERY    . COLONOSCOPY  2009   adenoma polyp  . ELBOW ARTHROSCOPY  2010  . LUMBAR PERCUTANEOUS PEDICLE SCREW 3 LEVEL N/A 05/25/2013   Procedure: Posterior Percutaneous Fixation from Thoracic nine to Lumbar one vertebrae with arthrodesis of Thoracic eleven Fracture;  Surgeon: Kristeen Miss, MD;  Location: [facility];  Service: Neurosurgery;  Laterality: N/A;  Posterior Percutaneous Fixation from Thoracic nine to Lumbar one vertebrae with arthrodesis of Thoracic eleven Fracture    There were no vitals filed for this visit.      Subjective Assessment - 09/15/16 0818    Subjective Patient denied falls since last visit. He reported no difficulty with his exercises except making time to do them. He reported sudden onset of Lt shoulder pain yesterday (09/14/16) while seated in his recliner. He reports it is less sore today and has no idea why it is  hurting (denies prior h/o Lt shoulder problems/pain).   Pertinent History HTN, R clavicle fx (per pt report, denied precautions), hx PE and LLE DVT, hyperlipidemia, DM, OSA, hx of Tx fx, arthritis, recent dx of ankylosing spondylitis per pt   Patient Stated Goals Build endurance (on feet 12 hours at [employer] one a year for 10-12 years), walk further distances, improve balance and strength   Currently in Pain? Yes   Pain Score 3    Pain Location Shoulder   Pain Orientation Left;Anterior   Pain Descriptors / Indicators Guarding   Pain Type Acute pain   Pain Onset Yesterday   Pain Frequency Intermittent   Aggravating Factors  reaching   Pain Relieving Factors rest   Effect of Pain on Daily Activities none; pt able to complete all ADLs                         [facility] Adult PT Treatment/Exercise - 09/15/16 1956      Bed Mobility   Bed Mobility Rolling Right;Right Sidelying to Sit;Sit to Sidelying Right;Rolling Left   Rolling Right 6: Modified independent (Device/Increase time)   Rolling Left 6: Modified independent (Device/Increase time)   Right Sidelying to Sit 6: Modified independent (Device/Increase time)   Sit to Sidelying Right 6: Modified independent (Device/Increase time)     Transfers   Transfers Sit to Stand;Stand to Sit   Sit to  Stand 6: Modified independent (Device/Increase time);Without upper extremity assist   Stand to Sit 6: Modified independent (Device/Increase time)   Comments wide base of support; prefers use of UEs to assist     Ambulation/Gait   Ambulation/Gait Assistance 6: Modified independent (Device/Increase time);1: +1 Total assist   Ambulation/Gait Assistance Details verbal cues for increased stride length and maximal upright posture (ankylosing spondylitis per pt    Ambulation Distance (Feet) 400 Feet  200   Assistive device None   Gait Pattern Step-through pattern;Decreased stride length;Decreased trunk rotation;Trunk flexed;Right  flexed knee in stance;Left flexed knee in stance;Wide base of support   Ambulation Surface Level;Indoor     Posture/Postural Control   Posture/Postural Control Postural limitations   Postural Limitations Forward head;Flexed trunk;Increased thoracic kyphosis  ankylosing spondylitis     Exercises   Exercises Knee/Hip     Knee/Hip Exercises: Supine   Bridges AROM;Strengthening;Both;1 set;10 reps  2 second hold; feet/knees apart     Knee/Hip Exercises: Sidelying   Clams Rt and lt sidelying; green theraband              Balance Exercises - 09/15/16 2004      Balance Exercises: Standing   Standing Eyes Opened Narrow base of support (BOS);Wide (BOA);Head turns;Foam/compliant surface;3 reps;30 secs  on blue beam both // & perpendicular; pillows   Standing Eyes Closed Narrow base of support (BOS);Wide (BOA);Foam/compliant surface;3 reps;30 secs  on blue beam both // & perpendicular; pillows   Tandem Stance Eyes open;Eyes closed;Foam/compliant surface;Intermittent upper extremity support;5 reps  attempting 30 sec; achieved x 1   Retro Gait 5 reps  10 ft x 5; cues for incr stride length     OTAGO PROGRAM   Heel Walking Intermittent UE support, // bars   Toe Walk Intermittent UE support, // bars           PT Education - 09/15/16 2022    Education provided Yes   Education Details Educated on proper technique with HEP and additional exercises added to program.   Person(s) Educated Patient   Methods Explanation;Demonstration;Handout   Comprehension Verbalized understanding;Returned demonstration;Need further instruction          PT Short Term Goals - 09/02/16 1401      PT SHORT TERM GOAL #1   Title Pt will be IND in HEP to improve strength, balance, endurance, and flexibility. TARGET DATE FOR ALL STGS: 09/23/16   Status New     PT SHORT TERM GOAL #2   Title Pt will amb. 400' over even terrain, IND, without an AD to improve functional mobility.    Status New     PT  SHORT TERM GOAL #3   Title Perform 6MWT and write goal as indicated.    Status Achieved     PT SHORT TERM GOAL #4   Title Pt will traveres curb/ramp IND in order to safely walk dog.   Status New           PT Long Term Goals - 09/02/16 1400      PT LONG TERM GOAL #1   Title Pt will verbalize understanding of fall prevention handouts to reduce risk of additional falls. TARGET DATE FOR ALL LTGS: 10/21/16   Status New     PT LONG TERM GOAL #2   Title Pt will improve DGI score to >/=22/24 to decr. falls risk.    Status New     PT LONG TERM GOAL #3   Title Pt will amb. 1000' over  even/uneven terrain, IND, in order to improve safety during functional mobility.    Status New     PT LONG TERM GOAL #4   Title Pt will report zero falls over the last 4 weeks to improve safety.   Status New     PT LONG TERM GOAL #5   Title Pt will perform 6MWT >/=1487' to improve endurance.    Status New               Plan - 09/15/16 2025    Clinical Impression Statement Patient eager to improve his balance and gait. Denies having been given too many exercises for home, "I just have to schedule a time to get them done." Skilled  interventions addressed education on HEP, gait training, and balance training.    Rehab Potential Good   Clinical Impairments Affecting Rehab Potential ankylosing spondylitis (see subjective above for PMH detail)   PT Frequency 2x / week   PT Duration 8 weeks   PT Treatment/Interventions ADLs/Self Care Home Management;Biofeedback;Moist Heat;Cryotherapy;Neuromuscular re-education;Balance training;Therapeutic exercise;Therapeutic activities;Stair training;Functional mobility training;Gait training;DME Instruction;Patient/family education;Orthotic Fit/Training;Vestibular;Manual techniques   PT Next Visit Plan Review ?'s re: new corner balance ex's given 1/22; Gait and balance over uneven terrain; issue stronger tband (has green)   Consulted and Agree with Plan of Care  Patient      Patient will benefit from skilled therapeutic intervention in order to improve the following deficits and impairments:  Abnormal gait, Decreased endurance, Decreased knowledge of use of DME, Impaired flexibility, Postural dysfunction, Decreased safety awareness, Decreased range of motion, Decreased balance, Decreased mobility, Decreased strength  Visit Diagnosis: Other abnormalities of gait and mobility  Abnormal posture     Problem List Patient Active Problem List   Diagnosis Date Noted  . History of adenomatous polyp of colon 08/05/2013  . Thoracic spine fracture ([place]) 05/23/2013  . Pulmonary embolism ([place]) 04/09/2012    Class: Acute  . Left leg DVT ([place]) 04/09/2012    Class: Acute  . Dyspnea on exertion 04/07/2012    Class: Acute  . Leg edema, left 04/07/2012    Class: Acute  . DIABETES, TYPE 2 04/12/2008  . HYPERLIPIDEMIA 04/12/2008  . HYPERTENSION 04/12/2008  . OSA (obstructive sleep apnea) 04/12/2008    09/15/2016 Barry Brunner, PT    Rexanne Mano 09/15/2016, 8:34 PM  [facility] [address] [place], [place], [address] Phone: [phone]   Fax:  [phone]  Name: Anthony Case MRN: [id number] Date of Birth: October 13, 1950

## 2016-09-17 ENCOUNTER — Encounter: Payer: Self-pay | Admitting: Physical Therapy

## 2016-09-17 ENCOUNTER — Ambulatory Visit: Payer: Medicare Other | Admitting: Physical Therapy

## 2016-09-17 DIAGNOSIS — R293 Abnormal posture: Secondary | ICD-10-CM

## 2016-09-17 DIAGNOSIS — R2689 Other abnormalities of gait and mobility: Secondary | ICD-10-CM

## 2016-09-17 NOTE — Therapy (Signed)
[facility] [address] [place], [place], [address] Phone: [phone]   Fax:  [phone]  Physical Therapy Treatment  Patient Details  Name: Anthony Case MRN: [id number] Date of Birth: 02-Dec-1950 Referring Provider: Dr. Philip Aspen  Encounter Date: 09/17/2016      PT End of Session - 09/17/16 0851    Visit Number 5   Number of Visits 17   Date for PT Re-Evaluation 10/25/16   Authorization Type MEDICARE G-CODE AND PROGRESS NOTE EVERY 10TH VISIT. BCBC Supp. ins. secondary   PT Start Time 0850   PT Stop Time 0930   PT Time Calculation (min) 40 min   Equipment Utilized During Treatment Gait belt   Activity Tolerance Patient tolerated treatment well   Behavior During Therapy WFL for tasks assessed/performed      Past Medical History:  Diagnosis Date  . Arthritis   . Diabetes mellitus   . Hx pulmonary embolism 2009   required emergent tPA treatment  . Hypertension   . Shortness of breath   . Sleep apnea     Past Surgical History:  Procedure Laterality Date  . BACK SURGERY    . COLONOSCOPY  2009   adenoma polyp  . ELBOW ARTHROSCOPY  2010  . LUMBAR PERCUTANEOUS PEDICLE SCREW 3 LEVEL N/A 05/25/2013   Procedure: Posterior Percutaneous Fixation from Thoracic nine to Lumbar one vertebrae with arthrodesis of Thoracic eleven Fracture;  Surgeon: Kristeen Miss, MD;  Location: [facility];  Service: Neurosurgery;  Laterality: N/A;  Posterior Percutaneous Fixation from Thoracic nine to Lumbar one vertebrae with arthrodesis of Thoracic eleven Fracture    There were no vitals filed for this visit.      Subjective Assessment - 09/17/16 0850    Subjective No new complaints. No falls or pain (shoulder is better today). Has done HEP except with pillows, having trouble finding one his spouse will let him stand on.   Pertinent History HTN, R clavicle fx (per pt report, denied precautions), hx PE and LLE DVT,  hyperlipidemia, DM, OSA, hx of Tx fx, arthritis, recent dx of ankylosing spondylitis per pt   Patient Stated Goals Build endurance (on feet 12 hours at [employer] one a year for 10-12 years), walk further distances, improve balance and strength   Currently in Pain? No/denies   Pain Score 0-No pain           [facility] Adult PT Treatment/Exercise - 09/17/16 0852      Transfers   Transfers Sit to Stand;Stand to Sit     Ambulation/Gait   Ambulation/Gait Yes   Ambulation/Gait Assistance 5: Supervision   Ambulation/Gait Assistance Details cues on posture, step length and to increase base of support for increased balance with lateral weight shifting   Ambulation Distance (Feet) 1000 Feet   Assistive device None   Gait Pattern Step-through pattern;Decreased stride length;Decreased trunk rotation;Trunk flexed;Right flexed knee in stance;Left flexed knee in stance;Wide base of support   Ambulation Surface Level;Unlevel;Indoor;Outdoor;Paved;Grass  rubber mulch             Balance Exercises - 09/17/16 0901      Balance Exercises: Standing   Standing Eyes Opened --   Standing Eyes Closed Narrow base of support (BOS);Wide (BOA);Head turns;Foam/compliant surface;Other reps (comment);Limitations   Balance Beam with blue foam balance beam: standing with feet across with no UE support- alternating fwd steppping to floor and back onto foam x 10 each leg, alternating backward stepping to floor and back onto  beam x 10 each LE with min assist and cues on posture, weight shifting for balance; in parallel bars with no UE support: side stepping left<>right and tandem gait fwd/bwd x 3 laps each with min assist and cues on ex form, intermittent UE support on bars needed for balance.   Other Standing Exercises seated with feet across blue foam beam: sit<>stands without UE support, cues for weight shifiting, foot placement on foam to increase base of support, to come up into full upright standing and for  slow, controlled sitting back on mat. performed 10 reps with up to min assist at timesl     Balance Exercises: Standing   Standing Eyes Closed Limitations on pillow over 1/4 inch foam in corner with chair in front for safety: progressed from wide base of support to narrow base of support: EC no head movements to EC head movements up<>down, left<>right and diagonals both ways; tandem standing with EC no head movements             PT Short Term Goals - 09/02/16 1401      PT SHORT TERM GOAL #1   Title Pt will be IND in HEP to improve strength, balance, endurance, and flexibility. TARGET DATE FOR ALL STGS: 09/23/16   Status New     PT SHORT TERM GOAL #2   Title Pt will amb. 400' over even terrain, IND, without an AD to improve functional mobility.    Status New     PT SHORT TERM GOAL #3   Title Perform 6MWT and write goal as indicated.    Status Achieved     PT SHORT TERM GOAL #4   Title Pt will traveres curb/ramp IND in order to safely walk dog.   Status New           PT Long Term Goals - 09/02/16 1400      PT LONG TERM GOAL #1   Title Pt will verbalize understanding of fall prevention handouts to reduce risk of additional falls. TARGET DATE FOR ALL LTGS: 10/21/16   Status New     PT LONG TERM GOAL #2   Title Pt will improve DGI score to >/=22/24 to decr. falls risk.    Status New     PT LONG TERM GOAL #3   Title Pt will amb. 1000' over even/uneven terrain, IND, in order to improve safety during functional mobility.    Status New     PT LONG TERM GOAL #4   Title Pt will report zero falls over the last 4 weeks to improve safety.   Status New     PT LONG TERM GOAL #5   Title Pt will perform 6MWT >/=1487' to improve endurance.    Status New            Plan - 09/17/16 [id number]    Clinical Impression Statement today's session continued to address gait on complaint surfaces and balance deficits. Several balance losses with corner balance activities needing assistance  to correct. Pt is making progress toward goals and should benefit from continued PT to progress toward unmet goals,    Rehab Potential Good   Clinical Impairments Affecting Rehab Potential ankylosing spondylitis (see subjective above for PMH detail)   PT Frequency 2x / week   PT Duration 8 weeks   PT Treatment/Interventions ADLs/Self Care Home Management;Biofeedback;Moist Heat;Cryotherapy;Neuromuscular re-education;Balance training;Therapeutic exercise;Therapeutic activities;Stair training;Functional mobility training;Gait training;DME Instruction;Patient/family education;Orthotic Fit/Training;Vestibular;Manual techniques   PT Next Visit Plan Gait over uneven terrain, high level  balance with emphasis on complaint surfaces and with single leg stance activities   Consulted and Agree with Plan of Care Patient      Patient will benefit from skilled therapeutic intervention in order to improve the following deficits and impairments:  Abnormal gait, Decreased endurance, Decreased knowledge of use of DME, Impaired flexibility, Postural dysfunction, Decreased safety awareness, Decreased range of motion, Decreased balance, Decreased mobility, Decreased strength  Visit Diagnosis: Other abnormalities of gait and mobility  Abnormal posture     Problem List Patient Active Problem List   Diagnosis Date Noted  . History of adenomatous polyp of colon 08/05/2013  . Thoracic spine fracture ([place]) 05/23/2013  . Pulmonary embolism ([place]) 04/09/2012    Class: Acute  . Left leg DVT ([place]) 04/09/2012    Class: Acute  . Dyspnea on exertion 04/07/2012    Class: Acute  . Leg edema, left 04/07/2012    Class: Acute  . DIABETES, TYPE 2 04/12/2008  . HYPERLIPIDEMIA 04/12/2008  . HYPERTENSION 04/12/2008  . OSA (obstructive sleep apnea) 04/12/2008    Willow Ora, PTA, [facility] Neuro [facility] [address], [place], [place] [address] [phone] 09/17/16, 2:23 PM   Name: Anthony Case MRN: [id number] Date of Birth: May 03, 1951

## 2016-09-25 ENCOUNTER — Ambulatory Visit: Payer: Medicare Other | Attending: Internal Medicine | Admitting: Physical Therapy

## 2016-09-25 ENCOUNTER — Encounter: Payer: Self-pay | Admitting: Physical Therapy

## 2016-09-25 DIAGNOSIS — R293 Abnormal posture: Secondary | ICD-10-CM | POA: Insufficient documentation

## 2016-09-25 DIAGNOSIS — R2689 Other abnormalities of gait and mobility: Secondary | ICD-10-CM | POA: Insufficient documentation

## 2016-09-25 NOTE — Therapy (Addendum)
[facility] [address] [place], [place], [address] Phone: [phone]   Fax:  [phone]  Physical Therapy Treatment  Patient Details  Name: JOMES POPELKA MRN: [id number] Date of Birth: [birth year] Referring Provider: Dr. Philip Aspen  Encounter Date: 09/25/2016      PT End of Session - 09/25/16 0806    Visit Number 6   Number of Visits 17   Date for PT Re-Evaluation 10/25/16   Authorization Type MEDICARE G-CODE AND PROGRESS NOTE EVERY 10TH VISIT. BCBC Supp. ins. secondary   PT Start Time 0803   PT Stop Time 0845   PT Time Calculation (min) 42 min   Equipment Utilized During Treatment Gait belt   Activity Tolerance Patient tolerated treatment well   Behavior During Therapy WFL for tasks assessed/performed      Past Medical History:  Diagnosis Date  . Arthritis   . Diabetes mellitus   . Hx pulmonary embolism 2009   required emergent tPA treatment  . Hypertension   . Shortness of breath   . Sleep apnea     Past Surgical History:  Procedure Laterality Date  . BACK SURGERY    . COLONOSCOPY  2009   adenoma polyp  . ELBOW ARTHROSCOPY  2010  . LUMBAR PERCUTANEOUS PEDICLE SCREW 3 LEVEL N/A 05/25/2013   Procedure: Posterior Percutaneous Fixation from Thoracic nine to Lumbar one vertebrae with arthrodesis of Thoracic eleven Fracture;  Surgeon: Kristeen Miss, MD;  Location: [facility];  Service: Neurosurgery;  Laterality: N/A;  Posterior Percutaneous Fixation from Thoracic nine to Lumbar one vertebrae with arthrodesis of Thoracic eleven Fracture    There were no vitals filed for this visit.      Subjective Assessment - 09/25/16 0805    Subjective No new complaints. Just returned from traveling (furniture market in [place] after [employer] in [place]). Most issue was with left hip from arthritis.   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 No/denies   Pain Score 0-No pain             [facility] Adult PT Treatment/Exercise - 09/25/16 0807      Transfers   Transfers Sit to Stand;Stand to Sit   Sit to Stand 6: Modified independent (Device/Increase time);Without upper extremity assist   Stand to Sit 6: Modified independent (Device/Increase time)     Ambulation/Gait   Ambulation/Gait Yes   Ambulation/Gait Assistance 5: Supervision   Ambulation/Gait Assistance Details cues on posture, this is approximate distance walked in 6 minutes (did not formally measure today)   Ambulation Distance (Feet) 1035 Feet   Assistive device None   Gait Pattern Step-through pattern;Decreased stride length;Decreased trunk rotation;Trunk flexed;Right flexed knee in stance;Left flexed knee in stance;Wide base of support   Ambulation Surface Level;Indoor     High Level Balance   High Level Balance Activities Head turns;Tandem walking;Marching forwards;Marching backwards  tandem, toe, heel all fwd/bwd, head mvmts up/down, lt/rt   High Level Balance Comments on red mats next to counter for UE support as needed: 3 laps each/each way with intermittent support on counter top for balance. cues on posture and weight shifting.  Balance Exercises - 09/25/16 0816      Balance Exercises: Standing   Rockerboard Anterior/posterior;Lateral;Head turns;EC;20 seconds;10 reps   Balance Beam with blue balance beam: seated with feet across beam x 10 reps with cues for full upright posture and slow, controlled descent; standing with feet across blue foam beam: alternating fwd stepping to floor and back onto foam beam x 10 reps each leg with cues to slow down, for weight shifting and posture, min  guard to min assist needed for beam activities.                    Balance Exercises: Standing   Rebounder Limitations performed both ways on balance board with intermittent UE support on bars for balance: holding board steady EC no head movements, EC head movements up<>down, left<>right and diagonals both way x 10 each with min to mod assist for balance and multiple episodes of balance loss. pt using bars to catch himself as well.                                         PT Short Term Goals - 09/02/16 1401      PT SHORT TERM GOAL #1   Title Pt will be IND in HEP to improve strength, balance, endurance, and flexibility. TARGET DATE FOR ALL STGS: 09/23/16   Status New     PT SHORT TERM GOAL #2   Title Pt will amb. 400' over even terrain, IND, without an AD to improve functional mobility.    Status New     PT SHORT TERM GOAL #3   Title Perform 6MWT and write goal as indicated.    Status Achieved     PT SHORT TERM GOAL #4   Title Pt will traveres curb/ramp IND in order to safely walk dog.   Status New           PT Long Term Goals - 09/02/16 1400      PT LONG TERM GOAL #1   Title Pt will verbalize understanding of fall prevention handouts to reduce risk of additional falls. TARGET DATE FOR ALL LTGS: 10/21/16   Status New     PT LONG TERM GOAL #2   Title Pt will improve DGI score to >/=22/24 to decr. falls risk.    Status New     PT LONG TERM GOAL #3   Title Pt will amb. 1000' over even/uneven terrain, IND, in order to improve safety during functional mobility.    Status New     PT LONG TERM GOAL #4   Title Pt will report zero falls over the last 4 weeks to improve safety.   Status New     PT LONG TERM GOAL #5   Title Pt will perform 6MWT >/=1487' to improve endurance.    Status New           Plan - 09/25/16 0807    Clinical Impression Statement today's skilled session continued to address balance and activity tolerance with no issues reported in session. Pt has  made good progress in walking distance before needing a rest break. Pt is progressing toward goals and should benefit from continued PT to progress toward unmet goals.    Rehab Potential Good   Clinical Impairments Affecting Rehab Potential ankylosing spondylitis (see subjective above for PMH detail)   PT Frequency 2x / week   PT  Duration 8 weeks   PT Treatment/Interventions ADLs/Self Care Home Management;Biofeedback;Moist Heat;Cryotherapy;Neuromuscular re-education;Balance training;Therapeutic exercise;Therapeutic activities;Stair training;Functional mobility training;Gait training;DME Instruction;Patient/family education;Orthotic Fit/Training;Vestibular;Manual techniques   PT Next Visit Plan Gait over uneven terrain, high level balance with emphasis on complaint surfaces and with single leg stance activities   Consulted and Agree with Plan of Care Patient      Patient will benefit from skilled therapeutic intervention in order to improve the following deficits and impairments:  Abnormal gait, Decreased endurance, Decreased knowledge of use of DME, Impaired flexibility, Postural dysfunction, Decreased safety awareness, Decreased range of motion, Decreased balance, Decreased mobility, Decreased strength  Visit Diagnosis: Other abnormalities of gait and mobility  Abnormal posture     Problem List Patient Active Problem List   Diagnosis Date Noted  . History of adenomatous polyp of colon 08/05/2013  . Thoracic spine fracture ([place]) 05/23/2013  . Pulmonary embolism ([place]) 04/09/2012    Class: Acute  . Left leg DVT ([place]) 04/09/2012    Class: Acute  . Dyspnea on exertion 04/07/2012    Class: Acute  . Leg edema, left 04/07/2012    Class: Acute  . DIABETES, TYPE 2 04/12/2008  . HYPERLIPIDEMIA 04/12/2008  . HYPERTENSION 04/12/2008  . OSA (obstructive sleep apnea) 04/12/2008    Willow Ora, PTA, [facility] Outpatient Neuro [facility] [name] [place], [place]  [address] [phone] 09/25/16, 4:34 PM  Name: TREYVONE GUMBLE MRN: [id number] Date of Birth: 26-Nov-1950

## 2016-09-26 ENCOUNTER — Ambulatory Visit: Payer: Medicare Other | Admitting: Physical Therapy

## 2016-09-26 ENCOUNTER — Encounter: Payer: Self-pay | Admitting: Physical Therapy

## 2016-09-26 DIAGNOSIS — R2689 Other abnormalities of gait and mobility: Secondary | ICD-10-CM

## 2016-09-26 DIAGNOSIS — R293 Abnormal posture: Secondary | ICD-10-CM

## 2016-09-26 NOTE — Therapy (Signed)
[facility] [address] [place], [place], [address] Phone: [phone]   Fax:  [phone]  Physical Therapy Treatment  Patient Details  Name: Anthony Case MRN: [id number] Date of Birth: 1950/09/04 Referring Provider: Dr. Philip Aspen  Encounter Date: 09/26/2016      PT End of Session - 09/26/16 0851    Visit Number 7   Number of Visits 17   Date for PT Re-Evaluation 10/25/16   Authorization Type MEDICARE G-CODE AND PROGRESS NOTE EVERY 10TH VISIT. BCBC Supp. ins. secondary   PT Start Time 0848   PT Stop Time 0930   PT Time Calculation (min) 42 min   Equipment Utilized During Treatment Gait belt   Activity Tolerance Patient tolerated treatment well   Behavior During Therapy WFL for tasks assessed/performed      Past Medical History:  Diagnosis Date  . Arthritis   . Diabetes mellitus   . Hx pulmonary embolism 2009   required emergent tPA treatment  . Hypertension   . Shortness of breath   . Sleep apnea     Past Surgical History:  Procedure Laterality Date  . BACK SURGERY    . COLONOSCOPY  2009   adenoma polyp  . ELBOW ARTHROSCOPY  2010  . LUMBAR PERCUTANEOUS PEDICLE SCREW 3 LEVEL N/A 05/25/2013   Procedure: Posterior Percutaneous Fixation from Thoracic nine to Lumbar one vertebrae with arthrodesis of Thoracic eleven Fracture;  Surgeon: Kristeen Miss, MD;  Location: [facility];  Service: Neurosurgery;  Laterality: N/A;  Posterior Percutaneous Fixation from Thoracic nine to Lumbar one vertebrae with arthrodesis of Thoracic eleven Fracture    There were no vitals filed for this visit.      Subjective Assessment - 09/26/16 0851    Subjective No new complaints. No pain or falls to report. Stiff today in left hip.   Pertinent History HTN, R clavicle fx (per pt report, denied precautions), hx PE and LLE DVT, hyperlipidemia, DM, OSA, hx of Tx fx, arthritis, recent dx of ankylosing spondylitis per pt   Patient Stated Goals Build endurance (on feet 12 hours at [employer] one a year for 10-12 years), walk further distances, improve balance and strength   Currently in Pain? No/denies   Pain Score 0-No pain              [facility] Adult PT Treatment/Exercise - 09/26/16 [id number]      Transfers   Transfers Sit to Stand;Stand to Sit   Sit to Stand 6: Modified independent (Device/Increase time);Without upper extremity assist   Stand to Sit 6: Modified independent (Device/Increase time)   Number of Reps 10 reps;2 sets   Comments with blue band around knees and maintained on stretch throughout reps     Ambulation/Gait   Ambulation/Gait Yes   Ambulation/Gait Assistance 5: Supervision   Ambulation/Gait Assistance Details occasional cues for posture/to look up (pt as flexed trunk due to anklylosiing spondylosis)   Ambulation Distance (Feet) 1500 Feet   Assistive device None   Gait Pattern Step-through pattern;Decreased stride length;Decreased trunk rotation;Trunk flexed;Right flexed knee in stance;Left flexed knee in stance;Wide base of support   Ambulation Surface Level;Indoor     Knee/Hip Exercises: Supine   Bridges Limitations with blue band around knees, cues on form and technique   Bridges with Clamshell AROM;Strengthening;Both;1 set;10 reps;Limitations     Knee/Hip Exercises: Sidelying   Clams bil sides with blue theraband: cues of form and technique.  Balance Exercises - 09/26/16 0916      Balance Exercises: Standing   Standing Eyes Opened Wide (BOA);Head turns;Foam/compliant surface;Other reps (comment);Limitations   Standing Eyes Closed Narrow base of support (BOS);Wide (BOA);Head turns;Foam/compliant surface;Other reps (comment);30 secs;20 secs;Limitations     Balance Exercises: Standing   Standing Eyes Opened Limitations on inverted BOSU in parallel bars: EO rocking bosu fwd/bwd and laterally x 10 each way with light to no UE support and min to mod assist  for balance, cues on posture and weight shifting to assist with balance; on inverted BOSU EO head movements up<>down, left<>right and diagonals both ways x 10 reps each with fingertip support on parallel bars and up to mod assist for balance, cues on posture and weight shifting to assist with balance.                      Standing Eyes Closed Limitations on gray foam in corner with chair in front for safety: narrow base of support EC no head movements 30 sec's x 2 reps, EC head movements up<>down, left<>right, and diagonals both ways x 10 each way with min guard assist for balance, no UE support: on inverted BOSU in parallel bars: EC no head movements for 20 sec's x 3 reps with min to mod assist with light support on parallel bars.                                    PT Short Term Goals - 09/02/16 1401      PT SHORT TERM GOAL #1   Title Pt will be IND in HEP to improve strength, balance, endurance, and flexibility. TARGET DATE FOR ALL STGS: 09/23/16   Status New     PT SHORT TERM GOAL #2   Title Pt will amb. 400' over even terrain, IND, without an AD to improve functional mobility.    Status New     PT SHORT TERM GOAL #3   Title Perform 6MWT and write goal as indicated.    Status Achieved     PT SHORT TERM GOAL #4   Title Pt will traveres curb/ramp IND in order to safely walk dog.   Status New           PT Long Term Goals - 09/02/16 1400      PT LONG TERM GOAL #1   Title Pt will verbalize understanding of fall prevention handouts to reduce risk of additional falls. TARGET DATE FOR ALL LTGS: 10/21/16   Status New     PT LONG TERM GOAL #2   Title Pt will improve DGI score to >/=22/24 to decr. falls risk.    Status New     PT LONG TERM GOAL #3   Title Pt will amb. 1000' over even/uneven terrain, IND, in order to improve safety during functional mobility.    Status New     PT LONG TERM GOAL #4   Title Pt will report zero falls over the last 4 weeks to improve safety.    Status New     PT LONG TERM GOAL #5   Title Pt will perform 6MWT >/=1487' to improve endurance.    Status New            Plan - 09/26/16 [id number]    Clinical Impression Statement today's skilled session continued to address activity tolerance, balance and bil hip strengthening without any issues reported.  Pt is making steady progress toward goals and should benefit from continued PT to progress toward unmet goals.    Rehab Potential Good   Clinical Impairments Affecting Rehab Potential ankylosing spondylitis (see subjective above for PMH detail)   PT Frequency 2x / week   PT Duration 8 weeks   PT Treatment/Interventions ADLs/Self Care Home Management;Biofeedback;Moist Heat;Cryotherapy;Neuromuscular re-education;Balance training;Therapeutic exercise;Therapeutic activities;Stair training;Functional mobility training;Gait training;DME Instruction;Patient/family education;Orthotic Fit/Training;Vestibular;Manual techniques   PT Next Visit Plan Gait over uneven terrain, high level balance with emphasis on complaint surfaces and with single leg stance activities   Consulted and Agree with Plan of Care Patient      Patient will benefit from skilled therapeutic intervention in order to improve the following deficits and impairments:  Abnormal gait, Decreased endurance, Decreased knowledge of use of DME, Impaired flexibility, Postural dysfunction, Decreased safety awareness, Decreased range of motion, Decreased balance, Decreased mobility, Decreased strength  Visit Diagnosis: Other abnormalities of gait and mobility  Abnormal posture     Problem List Patient Active Problem List   Diagnosis Date Noted  . History of adenomatous polyp of colon 08/05/2013  . Thoracic spine fracture ([place]) 05/23/2013  . Pulmonary embolism ([place]) 04/09/2012    Class: Acute  . Left leg DVT ([place]) 04/09/2012    Class: Acute  . Dyspnea on exertion 04/07/2012    Class: Acute  . Leg edema, left 04/07/2012    Class:  Acute  . DIABETES, TYPE 2 04/12/2008  . HYPERLIPIDEMIA 04/12/2008  . HYPERTENSION 04/12/2008  . OSA (obstructive sleep apnea) 04/12/2008    Willow Ora, PTA, [facility] Outpatient Neuro [facility] [address], [place], [place] [address] [phone] 09/26/16, 3:04 PM   Name: Anthony Case MRN: [id number] Date of Birth: 1950-10-18

## 2016-09-29 ENCOUNTER — Ambulatory Visit: Payer: PPO | Admitting: Physical Therapy

## 2016-10-02 ENCOUNTER — Encounter: Payer: Self-pay | Admitting: Physical Therapy

## 2016-10-02 ENCOUNTER — Ambulatory Visit: Payer: Medicare Other | Admitting: Physical Therapy

## 2016-10-02 DIAGNOSIS — R2689 Other abnormalities of gait and mobility: Secondary | ICD-10-CM

## 2016-10-02 DIAGNOSIS — R293 Abnormal posture: Secondary | ICD-10-CM | POA: Diagnosis not present

## 2016-10-02 DIAGNOSIS — L4050 Arthropathic psoriasis, unspecified: Secondary | ICD-10-CM | POA: Diagnosis not present

## 2016-10-02 DIAGNOSIS — Z79899 Other long term (current) drug therapy: Secondary | ICD-10-CM | POA: Diagnosis not present

## 2016-10-02 NOTE — Therapy (Signed)
[facility] [address] [place], [place], [address] Phone: [phone]   Fax:  [phone]  Physical Therapy Treatment  Patient Details  Name: Anthony Case MRN: [id number] Date of Birth: Apr 27, 1951 Referring Provider: Dr. Philip Aspen  Encounter Date: 10/02/2016      PT End of Session - 10/02/16 [id number]    Visit Number 8   Number of Visits 17   Date for PT Re-Evaluation 10/25/16   Authorization Type MEDICARE G-CODE AND PROGRESS NOTE EVERY 10TH VISIT. BCBC Supp. ins. secondary   PT Start Time 0802   PT Stop Time 0843   PT Time Calculation (min) 41 min   Equipment Utilized During Treatment Gait belt   Activity Tolerance Patient tolerated treatment well   Behavior During Therapy WFL for tasks assessed/performed      Past Medical History:  Diagnosis Date  . Arthritis   . Diabetes mellitus   . Hx pulmonary embolism 2009   required emergent tPA treatment  . Hypertension   . Shortness of breath   . Sleep apnea     Past Surgical History:  Procedure Laterality Date  . BACK SURGERY    . COLONOSCOPY  2009   adenoma polyp  . ELBOW ARTHROSCOPY  2010  . LUMBAR PERCUTANEOUS PEDICLE SCREW 3 LEVEL N/A 05/25/2013   Procedure: Posterior Percutaneous Fixation from Thoracic nine to Lumbar one vertebrae with arthrodesis of Thoracic eleven Fracture;  Surgeon: Kristeen Miss, MD;  Location: [facility];  Service: Neurosurgery;  Laterality: N/A;  Posterior Percutaneous Fixation from Thoracic nine to Lumbar one vertebrae with arthrodesis of Thoracic eleven Fracture    There were no vitals filed for this visit.      Subjective Assessment - 10/02/16 0806    Subjective Tired from all the traveling. Did have onset of left shoulder pain that lasted for 2 days, woke up with it gone this am. No falls or other pain to report.    Pertinent History HTN, R clavicle fx (per pt report, denied precautions), hx PE and LLE DVT, hyperlipidemia,  DM, OSA, hx of Tx fx, arthritis, recent dx of ankylosing spondylitis per pt   Patient Stated Goals Build endurance (on feet 12 hours at [employer] one a year for 10-12 years), walk further distances, improve balance and strength   Currently in Pain? No/denies   Pain Score 0-No pain             [facility] Adult PT Treatment/Exercise - 10/02/16 0809      Transfers   Number of Reps 10 reps;2 sets   Comments with feet on airex 2 sets of 10 with cues for full upright posture     Ambulation/Gait   Ambulation/Gait Yes   Ambulation/Gait Assistance 4: Min guard   Assistive device None   Gait Pattern Step-through pattern;Decreased stride length;Decreased trunk rotation;Trunk flexed;Right flexed knee in stance;Left flexed knee in stance;Wide base of support   Ambulation Surface Level;Indoor   Gait Comments gait along ~50 foot hallway: forward gait with head movements left<>right and up<>down x 4 laps each with min guard assist for balance. minor veering noted with decreased gait speed     High Level Balance   High Level Balance Activities Tandem walking;Marching forwards;Marching backwards  toe, heel , tandem walk fwd/bwd   High Level Balance Comments on red mats x 3 laps each/each way, no UE support. min guard to min assist for balance,.          Balance Exercises -  10/02/16 0906      Balance Exercises: Standing   SLS with Vectors Foam/compliant surface;Other reps (comment);Limitations   Rockerboard Anterior/posterior;Lateral;Head turns;EC;20 seconds;10 reps     Balance Exercises: Standing   SLS with Vectors Limitations with 6 cones along the edges of red mats: alternating toe tap toe each cone with side stepping  x 1 lap each way, alternating double toe taps toe each with side stepping left<>right x 1 lap each way. min guard to min assist for balance with cues on posture, stance position and weight shifting.            Rebounder Limitations performed both ways on large balance  board: EO rocking the board with emphasis on tall posture; EC no head movements progressing to EC head movements up<>down, left<>right and diagonals both ways. min to mod assist with intermittent UE support on parallel bars for balance                            PT Short Term Goals - 09/02/16 1401      PT SHORT TERM GOAL #1   Title Pt will be IND in HEP to improve strength, balance, endurance, and flexibility. TARGET DATE FOR ALL STGS: 09/23/16   Status New     PT SHORT TERM GOAL #2   Title Pt will amb. 400' over even terrain, IND, without an AD to improve functional mobility.    Status New     PT SHORT TERM GOAL #3   Title Perform 6MWT and write goal as indicated.    Status Achieved     PT SHORT TERM GOAL #4   Title Pt will traveres curb/ramp IND in order to safely walk dog.   Status New           PT Long Term Goals - 09/02/16 1400      PT LONG TERM GOAL #1   Title Pt will verbalize understanding of fall prevention handouts to reduce risk of additional falls. TARGET DATE FOR ALL LTGS: 10/21/16   Status New     PT LONG TERM GOAL #2   Title Pt will improve DGI score to >/=22/24 to decr. falls risk.    Status New     PT LONG TERM GOAL #3   Title Pt will amb. 1000' over even/uneven terrain, IND, in order to improve safety during functional mobility.    Status New     PT LONG TERM GOAL #4   Title Pt will report zero falls over the last 4 weeks to improve safety.   Status New     PT LONG TERM GOAL #5   Title Pt will perform 6MWT >/=1487' to improve endurance.    Status New           Plan - 10/02/16 [id number]    Clinical Impression Statement Today's skilled session focused on dynamic gait and high level balance activities. Pt continues to get hip pain with increased activity needing short rest breaks. Pt is progressing toward goals and should benefit from continued PT to progress toward unmet goals.   Rehab Potential Good   Clinical Impairments Affecting Rehab  Potential ankylosing spondylitis (see subjective above for PMH detail)   PT Frequency 2x / week   PT Duration 8 weeks   PT Treatment/Interventions ADLs/Self Care Home Management;Biofeedback;Moist Heat;Cryotherapy;Neuromuscular re-education;Balance training;Therapeutic exercise;Therapeutic activities;Stair training;Functional mobility training;Gait training;DME Instruction;Patient/family education;Orthotic Fit/Training;Vestibular;Manual techniques   PT Next Visit Plan Gait over uneven terrain,  high level balance with emphasis on complaint surfaces and with single leg stance activities   Consulted and Agree with Plan of Care Patient      Patient will benefit from skilled therapeutic intervention in order to improve the following deficits and impairments:  Abnormal gait, Decreased endurance, Decreased knowledge of use of DME, Impaired flexibility, Postural dysfunction, Decreased safety awareness, Decreased range of motion, Decreased balance, Decreased mobility, Decreased strength  Visit Diagnosis: Other abnormalities of gait and mobility  Abnormal posture     Problem List Patient Active Problem List   Diagnosis Date Noted  . History of adenomatous polyp of colon 08/05/2013  . Thoracic spine fracture ([facility]) 05/23/2013  . Pulmonary embolism ([facility]) 04/09/2012    Class: Acute  . Left leg DVT ([facility]) 04/09/2012    Class: Acute  . Dyspnea on exertion 04/07/2012    Class: Acute  . Leg edema, left 04/07/2012    Class: Acute  . DIABETES, TYPE 2 04/12/2008  . HYPERLIPIDEMIA 04/12/2008  . HYPERTENSION 04/12/2008  . OSA (obstructive sleep apnea) 04/12/2008    Willow Ora, PTA, [facility] [address], [place], [place] [address] [phone] 10/02/16, 9:24 AM   Name: KENE WOOLDRIDGE MRN: [id number] Date of Birth: Jul 02, 1951

## 2016-10-03 ENCOUNTER — Ambulatory Visit: Payer: Medicare Other

## 2016-10-03 DIAGNOSIS — R293 Abnormal posture: Secondary | ICD-10-CM

## 2016-10-03 DIAGNOSIS — R2689 Other abnormalities of gait and mobility: Secondary | ICD-10-CM | POA: Diagnosis not present

## 2016-10-03 NOTE — Patient Instructions (Addendum)
Walking on Heels   At counter: SLOWLY. Walk on heels forward while continuing on a straight path, and then walk on heels backward to starting position. Repeat for 3 laps each way. Do _1-2__ sessions per day.  Copyright  VHI. All rights reserved.   Walking on Toes   At counter for balance as needed: Walk SLOWLY on toes forward while continuing on a straight path, and then backwards on toes to starting position. Repeat 3 laps each way. Do _1___ sessions per day.  Copyright  VHI. All rights reserved.    Functional Quadriceps: Sit to Stand    PERFORM FROM A SOFA WITH A BIG CUSHION. Sit on edge of chair, feet flat on floor. Stand upright, extending knees fully. Repeat __10__ times per set. Do __3__ sets per session. Do __3-4__ sessions per week.  http://orth.exer.us/734   Copyright  VHI. All rights reserved.     Perform at counter with hand support as needed: Backward    Walk backwards with eyes open. Take BIG even steps, making sure each foot lifts off floor. Repeat for __4__ laps per session. Do __1__ sessions per day.  Copyright  VHI. All rights reserved.     Feet Together (Compliant Surface) Head Motion - Eyes Closed    Stand on compliant surface:    with feet together. Close eyes and move head slowly, up and down. Repeat 10   times per session. Do  1  sessions per day, 5 days per week  Copyright  VHI. All rights reserved.     PROGRESS TO THIS EXERCISES, ONCE PARTIAL HEEL TO TO BECOME EASY Feet Partial Heel-Toe (Compliant Surface) Head Motion - Eyes Closed    Stand on compliant surface:    with right foot partially in front of the other. Close eyes and move head slowly, up and down. After 10 reps, switch feet to left foot in front. Repeat  10  times per session. Do   1 sessions per day, 5 days per week  Copyright  VHI. All rights reserved. Feet Partial Heel-Toe (Compliant Surface) Head Motion - Eyes Open    With eyes open, standing on  compliant surface: _PILLOWS_______, right foot partially in front of the other, move head slowly: up and down 5 TIMES AND SIDE TO SIDE 5 TIMES. Repeat __3__ times per session. Do _1___ sessions per day.  Copyright  VHI. All rights reserved.

## 2016-10-03 NOTE — Therapy (Signed)
[facility] [address] [place], [place], [address] Phone: [phone]   Fax:  [phone]  Physical Therapy Treatment  Patient Details  Name: Anthony Case MRN: [id number] Date of Birth: [birth year] Referring Provider: Dr. Philip Aspen  Encounter Date: 10/03/2016      PT End of Session - 10/03/16 1552    Visit Number 9   Number of Visits 17   Date for PT Re-Evaluation 10/25/16   Authorization Type MEDICARE G-CODE AND PROGRESS NOTE EVERY 10TH VISIT. BCBC Supp. ins. secondary   PT Start Time 1401   PT Stop Time 1444   PT Time Calculation (min) 43 min   Equipment Utilized During Treatment --  S for safety prn   Activity Tolerance Patient tolerated treatment well   Behavior During Therapy WFL for tasks assessed/performed      Past Medical History:  Diagnosis Date  . Arthritis   . Diabetes mellitus   . Hx pulmonary embolism 2009   required emergent tPA treatment  . Hypertension   . Shortness of breath   . Sleep apnea     Past Surgical History:  Procedure Laterality Date  . BACK SURGERY    . COLONOSCOPY  2009   adenoma polyp  . ELBOW ARTHROSCOPY  2010  . LUMBAR PERCUTANEOUS PEDICLE SCREW 3 LEVEL N/A 05/25/2013   Procedure: Posterior Percutaneous Fixation from Thoracic nine to Lumbar one vertebrae with arthrodesis of Thoracic eleven Fracture;  Surgeon: Kristeen Miss, MD;  Location: [facility];  Service: Neurosurgery;  Laterality: N/A;  Posterior Percutaneous Fixation from Thoracic nine to Lumbar one vertebrae with arthrodesis of Thoracic eleven Fracture    There were no vitals filed for this visit.      Subjective Assessment - 10/03/16 1405    Subjective Pt reported he's still a little fatigued from travelling. Pt reproted his endurance is getting better, as he was able to tolerated walking the shows.    Pertinent History HTN, R clavicle fx (per pt report, denied precautions), hx PE and LLE DVT,  hyperlipidemia, DM, OSA, hx of Tx fx, arthritis, recent dx of ankylosing spondylitis per pt   Patient Stated Goals Build endurance (on feet 12 hours at [employer] one a year for 10-12 years), walk further distances, improve balance and strength   Currently in Pain? No/denies        Neuro re-ed: Please see pt instructions for details, performed with S for safety. Pt performed in corner with chair in front of pt and at counter with intermittent UE support. Pt also performed B SLS x10sec. Holds with intermittent UE support on chair.                 [facility] Adult PT Treatment/Exercise - 10/03/16 1408      Ambulation/Gait   Ambulation/Gait Yes   Ambulation/Gait Assistance 5: Supervision;7: Independent   Ambulation/Gait Assistance Details Pt progressed from S to IND after amb. approx. 100'. Pt was able to pick up obstacle (RW) from path and move it to the side without LOB. Cues to decr. gait cadence.   Ambulation Distance (Feet) 450 Feet   Assistive device None   Gait Pattern Step-through pattern;Decreased stride length;Decreased trunk rotation;Trunk flexed;Right flexed knee in stance;Left flexed knee in stance;Wide base of support   Ambulation Surface Level;Indoor   Ramp 5: Supervision   Ramp Details (indicate cue type and reason) Cues for wt. shifting and technique.   Curb 5: Supervision   Curb Details (indicate cue type  and reason) Cues for proper technique.               PT Education - 10/03/16 1552    Education provided Yes   Education Details PT reviewed neuro re-ed HEP and progressed/modified as tolerated. PT discussed remaining POC.   Person(s) Educated Patient   Methods Explanation;Demonstration;Verbal cues;Handout   Comprehension Returned demonstration;Verbalized understanding          PT Short Term Goals - 10/03/16 1554      PT SHORT TERM GOAL #1   Title Pt will be IND in HEP to improve strength, balance, endurance, and flexibility. TARGET DATE  FOR ALL STGS: 09/23/16   Status New     PT SHORT TERM GOAL #2   Title Pt will amb. 400' over even terrain, IND, without an AD to improve functional mobility.    Status Partially Met     PT SHORT TERM GOAL #3   Title Perform 6MWT and write goal as indicated.    Status Achieved     PT SHORT TERM GOAL #4   Title Pt will traveres curb/ramp IND in order to safely walk dog.   Status Achieved           PT Long Term Goals - 09/02/16 1400      PT LONG TERM GOAL #1   Title Pt will verbalize understanding of fall prevention handouts to reduce risk of additional falls. TARGET DATE FOR ALL LTGS: 10/21/16   Status New     PT LONG TERM GOAL #2   Title Pt will improve DGI score to >/=22/24 to decr. falls risk.    Status New     PT LONG TERM GOAL #3   Title Pt will amb. 1000' over even/uneven terrain, IND, in order to improve safety during functional mobility.    Status New     PT LONG TERM GOAL #4   Title Pt will report zero falls over the last 4 weeks to improve safety.   Status New     PT LONG TERM GOAL #5   Title Pt will perform 6MWT >/=1487' to improve endurance.    Status New               Plan - 10/03/16 1553    Clinical Impression Statement Pt partially met STG 2 and met STG 4. Part of STG 1 assessed, will assess remaining HEP activities next session and progress as tolerated. Pt continues to require cues to decr. gait cadence and to not use momentum to propel forward to improve safety. Continue with POC.    Rehab Potential Good   Clinical Impairments Affecting Rehab Potential ankylosing spondylitis (see subjective above for PMH detail)   PT Frequency 2x / week   PT Duration 8 weeks   PT Treatment/Interventions ADLs/Self Care Home Management;Biofeedback;Moist Heat;Cryotherapy;Neuromuscular re-education;Balance training;Therapeutic exercise;Therapeutic activities;Stair training;Functional mobility training;Gait training;DME Instruction;Patient/family education;Orthotic  Fit/Training;Vestibular;Manual techniques   PT Next Visit Plan G-code next session. Assess remaining STG (HEP) and modify/progress prn. Add SLS to HEP. Gait over uneven terrain, high level balance with emphasis on complaint surfaces and with single leg stance activities   Consulted and Agree with Plan of Care Patient      Patient will benefit from skilled therapeutic intervention in order to improve the following deficits and impairments:  Abnormal gait, Decreased endurance, Decreased knowledge of use of DME, Impaired flexibility, Postural dysfunction, Decreased safety awareness, Decreased range of motion, Decreased balance, Decreased mobility, Decreased strength  Visit Diagnosis: Other abnormalities  of gait and mobility  Abnormal posture     Problem List Patient Active Problem List   Diagnosis Date Noted  . History of adenomatous polyp of colon 08/05/2013  . Thoracic spine fracture ([facility]) 05/23/2013  . Pulmonary embolism ([facility]) 04/09/2012    Class: Acute  . Left leg DVT ([facility]) 04/09/2012    Class: Acute  . Dyspnea on exertion 04/07/2012    Class: Acute  . Leg edema, left 04/07/2012    Class: Acute  . DIABETES, TYPE 2 04/12/2008  . HYPERLIPIDEMIA 04/12/2008  . HYPERTENSION 04/12/2008  . OSA (obstructive sleep apnea) 04/12/2008    Iveliz Garay L 10/03/2016, 3:55 PM  [facility] [address] [place], [place], [address] Phone: [phone]   Fax:  [phone]  Name: Anthony Case MRN: [id number] Date of Birth: 05-08-1951  Geoffry Paradise, PT,DPT 10/03/16 3:57 PM Phone: [phone] Fax: [phone]

## 2016-10-07 ENCOUNTER — Ambulatory Visit: Payer: Medicare Other

## 2016-10-07 DIAGNOSIS — R2689 Other abnormalities of gait and mobility: Secondary | ICD-10-CM

## 2016-10-07 DIAGNOSIS — R293 Abnormal posture: Secondary | ICD-10-CM | POA: Diagnosis not present

## 2016-10-07 NOTE — Therapy (Addendum)
[facility] [address] [place], [place], [address] Phone: [phone]   Fax:  [phone]  Physical Therapy Treatment  Patient Details  Name: DERL ABALOS MRN: [id number] Date of Birth: [birth year] Referring Provider: Dr. Philip Aspen  Encounter Date: 10/07/2016      PT End of Session - 10/07/16 1130    Visit Number 10   Number of Visits 17   Date for PT Re-Evaluation 10/25/16   Authorization Type MEDICARE G-CODE AND PROGRESS NOTE EVERY 10TH VISIT. BCBC Supp. ins. secondary   PT Start Time 0934   PT Stop Time 1015   PT Time Calculation (min) 41 min   Equipment Utilized During Treatment --  min guard to S prn   Activity Tolerance Patient tolerated treatment well   Behavior During Therapy WFL for tasks assessed/performed      Past Medical History:  Diagnosis Date  . Arthritis   . Diabetes mellitus   . Hx pulmonary embolism 2009   required emergent tPA treatment  . Hypertension   . Shortness of breath   . Sleep apnea     Past Surgical History:  Procedure Laterality Date  . BACK SURGERY    . COLONOSCOPY  2009   adenoma polyp  . ELBOW ARTHROSCOPY  2010  . LUMBAR PERCUTANEOUS PEDICLE SCREW 3 LEVEL N/A 05/25/2013   Procedure: Posterior Percutaneous Fixation from Thoracic nine to Lumbar one vertebrae with arthrodesis of Thoracic eleven Fracture;  Surgeon: Kristeen Miss, MD;  Location: [facility];  Service: Neurosurgery;  Laterality: N/A;  Posterior Percutaneous Fixation from Thoracic nine to Lumbar one vertebrae with arthrodesis of Thoracic eleven Fracture    There were no vitals filed for this visit.      Subjective Assessment - 10/07/16 0936    Subjective Pt denied falls or changes since last visit.    Pertinent History HTN, R clavicle fx (per pt report, denied precautions), hx PE and LLE DVT, hyperlipidemia, DM, OSA, hx of Tx fx, arthritis, recent dx of ankylosing spondylitis per pt   Patient Stated  Goals Build endurance (on feet 12 hours at [employer] one a year for 10-12 years), walk further distances, improve balance and strength   Currently in Pain? No/denies            [facility] PT Assessment - 10/07/16 0941      Functional Gait  Assessment   Gait assessed  Yes   Gait Level Surface Walks 20 ft in less than 7 sec but greater than 5.5 sec, uses assistive device, slower speed, mild gait deviations, or deviates 6-10 in outside of the 12 in walkway width.  5.7sec.   Change in Gait Speed Able to smoothly change walking speed without loss of balance or gait deviation. Deviate no more than 6 in outside of the 12 in walkway width.   Gait with Horizontal Head Turns Performs head turns smoothly with no change in gait. Deviates no more than 6 in outside 12 in walkway width   Gait with Vertical Head Turns Performs head turns with no change in gait. Deviates no more than 6 in outside 12 in walkway width.   Gait and Pivot Turn Pivot turns safely within 3 sec and stops quickly with no loss of balance.   Step Over Obstacle Is able to step over 2 stacked shoe boxes taped together (9 in total height) without changing gait speed. No evidence of imbalance.   Gait with Narrow Base of Support Ambulates 4-7 steps.  6 steps   Gait with Eyes Closed Walks 20 ft, slow speed, abnormal gait pattern, evidence for imbalance, deviates 10-15 in outside 12 in walkway width. Requires more than 9 sec to ambulate 20 ft.   Ambulating Backwards Walks 20 ft, uses assistive device, slower speed, mild gait deviations, deviates 6-10 in outside 12 in walkway width.   Steps Alternating feet, no rail.   Total Score 24       Therex: Pt performed strengthening mat HEP with S for safety and tactile cues for technique during clamshells. Please see pt instructions for details. No pain noted during therex.  PT also added SLS to HEP handout, performed last session.               [facility] Adult PT Treatment/Exercise -  10/07/16 0941      Ambulation/Gait   Ambulation/Gait Yes   Ambulation/Gait Assistance 7: Independent;5: Supervision   Ambulation/Gait Assistance Details Cues to decr. gait cadence and improve step length, otherwise IND.   Ambulation Distance (Feet) 300 Feet   Assistive device None   Gait Pattern Step-through pattern;Decreased stride length;Decreased trunk rotation;Trunk flexed;Right flexed knee in stance;Left flexed knee in stance;Wide base of support   Ambulation Surface Level;Indoor   Gait velocity 3.31f/sec.     Standardized Balance Assessment   Standardized Balance Assessment Dynamic Gait Index     Dynamic Gait Index   Level Surface Mild Impairment   Change in Gait Speed Normal   Gait with Horizontal Head Turns Normal   Gait with Vertical Head Turns Normal   Gait and Pivot Turn Normal   Step Over Obstacle Normal   Step Around Obstacles Normal   Steps Normal   Total Score 23                PT Education - 10/07/16 1129    Education provided Yes   Education Details PT discussed outcome measure results and LTG (DGI) 2 met. PT had pt perform and progress strengthening HEP as tolerated.    Person(s) Educated Patient   Methods Explanation;Demonstration;Verbal cues;Tactile cues;Handout   Comprehension Returned demonstration;Verbalized understanding          PT Short Term Goals - 10/07/16 1132      PT SHORT TERM GOAL #1   Title Pt will be IND in HEP to improve strength, balance, endurance, and flexibility. TARGET DATE FOR ALL STGS: 09/23/16   Status Partially Met     PT SHORT TERM GOAL #2   Title Pt will amb. 400' over even terrain, IND, without an AD to improve functional mobility.    Status Partially Met     PT SHORT TERM GOAL #3   Title Perform 6MWT and write goal as indicated.    Status Achieved     PT SHORT TERM GOAL #4   Title Pt will traveres curb/ramp IND in order to safely walk dog.   Status Achieved           PT Long Term Goals - 09/02/16  1400      PT LONG TERM GOAL #1   Title Pt will verbalize understanding of fall prevention handouts to reduce risk of additional falls. TARGET DATE FOR ALL LTGS: 10/21/16   Status New     PT LONG TERM GOAL #2   Title Pt will improve DGI score to >/=22/24 to decr. falls risk.    Status New     PT LONG TERM GOAL #3   Title Pt will amb. 1000' over even/uneven  terrain, IND, in order to improve safety during functional mobility.    Status New     PT LONG TERM GOAL #4   Title Pt will report zero falls over the last 4 weeks to improve safety.   Status New     PT LONG TERM GOAL #5   Title Pt will perform 6MWT >/=1487' to improve endurance.    Status New               Plan - 10-12-16 1131    Clinical Impression Statement Pt partially met STG 1(HEP), as he required tactile cues during clamshells for proper technique. Pt met LTG 2, therefore, FGA performed. FGA score 24/30 indicates pt is at risk for falls. Continue with POC.    Rehab Potential Good   Clinical Impairments Affecting Rehab Potential ankylosing spondylitis (see subjective above for PMH detail)   PT Frequency 2x / week   PT Duration 8 weeks   PT Treatment/Interventions ADLs/Self Care Home Management;Biofeedback;Moist Heat;Cryotherapy;Neuromuscular re-education;Balance training;Therapeutic exercise;Therapeutic activities;Stair training;Functional mobility training;Gait training;DME Instruction;Patient/family education;Orthotic Fit/Training;Vestibular;Manual techniques   PT Next Visit Plan Gait over uneven terrain, high level balance with emphasis on complaint surfaces and with single leg stance activities   Consulted and Agree with Plan of Care Patient      Patient will benefit from skilled therapeutic intervention in order to improve the following deficits and impairments:  Abnormal gait, Decreased endurance, Decreased knowledge of use of DME, Impaired flexibility, Postural dysfunction, Decreased safety awareness, Decreased  range of motion, Decreased balance, Decreased mobility, Decreased strength  Visit Diagnosis: Other abnormalities of gait and mobility       G-Codes - October 12, 2016 0938    Functional Assessment Tool Used DGI: 23/24 and gait speed no AD: 3.90f/sec., FGA: 24/30   Functional Limitation Mobility: Walking and moving around   Mobility: Walking and Moving Around Current Status [phone] At least 20 percent but less than 40 percent impaired, limited or restricted  original G-code current should've been CK and goal g-code should've been CI   Mobility: Walking and Moving Around Goal Status [phone] At least 1 percent but less than 20 percent impaired, limited or restricted      Problem List Patient Active Problem List   Diagnosis Date Noted  . History of adenomatous polyp of colon 08/05/2013  . Thoracic spine fracture ([facility] 05/23/2013  . Pulmonary embolism ([place] 04/09/2012    Class: Acute  . Left leg DVT ([place] 04/09/2012    Class: Acute  . Dyspnea on exertion 04/07/2012    Class: Acute  . Leg edema, left 04/07/2012    Class: Acute  . DIABETES, TYPE 2 04/12/2008  . HYPERLIPIDEMIA 04/12/2008  . HYPERTENSION 04/12/2008  . OSA (obstructive sleep apnea) 04/12/2008    Arryana Tolleson L 2February 18, 2018 11:33 AM  [address] [address]Phone: [phone]  Fax:  [phone] Name: RZACKARIA BURKEYMRN: [id number] of Birth: [birth year] Physical Therapy Progress Note  Dates of Reporting Period: 08/26/16  to 218-Feb-2018 Objective Reports of Subjective Statement: Pt's outcome measures have improved and pt feels balance and strength are improving.  Objective Measurements: See above  Goal Update:      PT Short Term Goals - 0Feb 18, 20181132      PT SHORT TERM GOAL #1   Title Pt will be IND in HEP to improve strength, balance, endurance, and flexibility. TARGET DATE FOR ALL STGS: 09/23/16   Status  Partially  Met     PT SHORT TERM GOAL #2   Title Pt will amb. 400' over even terrain, IND, without an AD to improve functional mobility.    Status Partially Met     PT SHORT TERM GOAL #3   Title Perform 6MWT and write goal as indicated.    Status Achieved     PT SHORT TERM GOAL #4   Title Pt will traveres curb/ramp IND in order to safely walk dog.   Status Achieved         PT Long Term Goals - 09/02/16 1400      PT LONG TERM GOAL #1   Title Pt will verbalize understanding of fall prevention handouts to reduce risk of additional falls. TARGET DATE FOR ALL LTGS: 10/21/16   Status New     PT LONG TERM GOAL #2   Title Pt will improve DGI score to >/=22/24 to decr. falls risk.    Status Achieved      PT LONG TERM GOAL #3   Title Pt will amb. 1000' over even/uneven terrain, IND, in order to improve safety during functional mobility.    Status New     PT LONG TERM GOAL #4   Title Pt will report zero falls over the last 4 weeks to improve safety.   Status New     PT LONG TERM GOAL #5   Title Pt will perform 6MWT >/=1487' to improve endurance.    Status New       Plan: Continue with gait, balance, endurance, and strength training.  Reason Skilled Services are Required: To improve safety during functional mobility.     Geoffry Paradise, PT,DPT 10/07/16 11:34 AM Phone: [phone] Fax: [phone]

## 2016-10-07 NOTE — Patient Instructions (Addendum)
Perform in a corner with a chair in front of you for safety:  Single Leg - Eyes Open    Holding support, lift right leg while maintaining balance over other leg. Progress to removing hands from support surface for longer periods of time. Repeat with other leg. Hold__10-30__ seconds. Repeat __3__ times per session per leg. Do __1__ sessions per day.  Copyright  VHI. All rights reserved.    Bridge    Lying on back, legs bent 90, feet flat on floor. Press up hips and torso, while tucking in stomach. Hold for 5 seconds. Then slowly lower hips back down. Perform 2 sets of 10 reps. 3 days a week.  Copyright  VHI. All rights reserved.   Abduction: Clam (Eccentric) - Side-Lying    Tie blue band above knees. Lie on side with knees bent. Lift top knee, keeping feet together. Keep trunk steady. Slowly lower leg. _10__ reps per set, _3__ sets per day, _3__ days per week. Repeat on other side.  http://ecce.exer.us/64   Copyright  VHI. All rights reserved.

## 2016-10-08 ENCOUNTER — Ambulatory Visit (INDEPENDENT_AMBULATORY_CARE_PROVIDER_SITE_OTHER): Payer: Medicare Other | Admitting: Podiatry

## 2016-10-08 ENCOUNTER — Encounter: Payer: Self-pay | Admitting: Podiatry

## 2016-10-08 VITALS — Ht 72.0 in | Wt 274.0 lb

## 2016-10-08 DIAGNOSIS — B351 Tinea unguium: Secondary | ICD-10-CM

## 2016-10-08 DIAGNOSIS — E1149 Type 2 diabetes mellitus with other diabetic neurological complication: Secondary | ICD-10-CM

## 2016-10-08 DIAGNOSIS — M129 Arthropathy, unspecified: Secondary | ICD-10-CM

## 2016-10-08 DIAGNOSIS — M79674 Pain in right toe(s): Secondary | ICD-10-CM

## 2016-10-08 NOTE — Progress Notes (Signed)
Complaint:  Visit Type: Patient presents  to my office for preventative foot care services. Complaint: Patient states" my nails left  great toenail was injured and has grown back thick and unsightly. Patient has been diagnosed with DM with diminished LOPS. The patient presents for preventative foot care services.   Podiatric Exam: Vascular: dorsalis pedis and posterior tibial pulses are palpable bilateral. Capillary return is immediate. Temperature gradient is WNL. Skin turgor WNL  Sensorium: Absent   Semmes Weinstein monofilament test. Normal tactile sensation bilaterally. Nail Exam: Pt has thick disfigured discolored nails with subungual debris noted left  entire nail hallux Ulcer Exam: There is no evidence of ulcer or pre-ulcerative changes or infection. Orthopedic Exam: Muscle tone and strength are WNL. No limitations in general ROM. No crepitus or effusions noted. Foot type and digits show no abnormalities. Bony prominences are unremarkable. Pes planus foot type. Skin: No Porokeratosis. No infection or ulcers  Diagnosis:  Onychomycosis, , Pain in left great toe, DPN  Pes planus,  Callus  Treatment & Plan Procedures and Treatment: Consent by patient was obtained for treatment procedures. The patient understood the discussion of treatment and procedures well. All questions were answered thoroughly reviewed.  Initial exam. Debridement of mycotic and hypertrophic toenails, 1 through 5 bilateral and clearing of subungual debris. No ulceration, no infection noted. Initiate diabetic shoe paperwork for DPN and DJD. Return Visit-Office Procedure: Patient instructed to return to the office for a follow up visit 3 months for continued evaluation and treatment.    Gardiner Barefoot DPM

## 2016-10-09 ENCOUNTER — Ambulatory Visit: Payer: Medicare Other

## 2016-10-09 DIAGNOSIS — R293 Abnormal posture: Secondary | ICD-10-CM

## 2016-10-09 DIAGNOSIS — R2689 Other abnormalities of gait and mobility: Secondary | ICD-10-CM | POA: Diagnosis not present

## 2016-10-09 NOTE — Therapy (Signed)
[facility] [address] [place], [place], [address] Phone: [phone]   Fax:  [phone]  Physical Therapy Treatment  Patient Details  Name: Anthony Case MRN: [id number] Date of Birth: 07/14/1951 Referring Provider: Dr. Philip Aspen  Encounter Date: 10/09/2016      PT End of Session - 10/09/16 1018    Visit Number 11   Number of Visits 17   Date for PT Re-Evaluation 10/25/16   Authorization Type MEDICARE G-CODE AND PROGRESS NOTE EVERY 10TH VISIT. BCBC Supp. ins. secondary   PT Start Time 0935   PT Stop Time 1015   PT Time Calculation (min) 40 min   Equipment Utilized During Treatment Gait belt   Activity Tolerance Patient tolerated treatment well   Behavior During Therapy WFL for tasks assessed/performed      Past Medical History:  Diagnosis Date  . Arthritis   . Diabetes mellitus   . Hx pulmonary embolism 2009   required emergent tPA treatment  . Hypertension   . Shortness of breath   . Sleep apnea     Past Surgical History:  Procedure Laterality Date  . BACK SURGERY    . COLONOSCOPY  2009   adenoma polyp  . ELBOW ARTHROSCOPY  2010  . LUMBAR PERCUTANEOUS PEDICLE SCREW 3 LEVEL N/A 05/25/2013   Procedure: Posterior Percutaneous Fixation from Thoracic nine to Lumbar one vertebrae with arthrodesis of Thoracic eleven Fracture;  Surgeon: Kristeen Miss, MD;  Location: [facility];  Service: Neurosurgery;  Laterality: N/A;  Posterior Percutaneous Fixation from Thoracic nine to Lumbar one vertebrae with arthrodesis of Thoracic eleven Fracture    There were no vitals filed for this visit.      Subjective Assessment - 10/09/16 0937    Subjective Pt denied falls since last visit. Pt reported he did not take pain medication for hip today and is a little stiff.    Pertinent History HTN, R clavicle fx (per pt report, denied precautions), hx PE and LLE DVT, hyperlipidemia, DM, OSA, hx of Tx fx, arthritis, recent  dx of ankylosing spondylitis per pt   Patient Stated Goals Build endurance (on feet 12 hours at [employer] one a year for 10-12 years), walk further distances, improve balance and strength   Currently in Pain? Yes   Pain Score 2    Pain Location Hip   Pain Orientation Left;Anterior   Pain Descriptors / Indicators Discomfort   Pain Type Chronic pain  acute on chronic-pt didn't take meds today   Pain Onset Today   Pain Frequency Intermittent   Aggravating Factors  certain movements   Pain Relieving Factors medication and rest                         [facility] Adult PT Treatment/Exercise - 10/09/16 0939      Ambulation/Gait   Ambulation/Gait Yes   Ambulation/Gait Assistance 5: Supervision   Ambulation/Gait Assistance Details Cues for wt. shifting while traversing inclines/declines outdoors. Pt performed head turns/nods over even terrain indoors. Cues to decr. gait cadence, improve stride length and heel strike.   Ambulation Distance (Feet) 300 Feet  indoors and 500' outdoors   Assistive device None   Gait Pattern Step-through pattern;Decreased stride length;Decreased trunk rotation;Trunk flexed;Right flexed knee in stance;Left flexed knee in stance;Wide base of support   Ambulation Surface Level;Unlevel;Indoor;Outdoor;Paved     High Level Balance   High Level Balance Activities Tandem walking;Marching forwards;Backward walking;Head turns;Side stepping;Braiding  High Level Balance Comments Performed in // bars in min guard to S on red/blue mats. Pt also performed single, double and knock down/ upright cones with B LE 5x5reps/LE/activity. 6x15' for each activity listed above. Cues for wt. shifting and technique. Intermittent use of UEs for support.     Exercises   Exercises Knee/Hip     Knee/Hip Exercises: Sidelying   Clams B clams with blue band above knees 3x10 reps. Cues to keep hips forward, pt requested to perform during session to ensure proper technique.                   PT Short Term Goals - 10/07/16 1132      PT SHORT TERM GOAL #1   Title Pt will be IND in HEP to improve strength, balance, endurance, and flexibility. TARGET DATE FOR ALL STGS: 09/23/16   Status Partially Met     PT SHORT TERM GOAL #2   Title Pt will amb. 400' over even terrain, IND, without an AD to improve functional mobility.    Status Partially Met     PT SHORT TERM GOAL #3   Title Perform 6MWT and write goal as indicated.    Status Achieved     PT SHORT TERM GOAL #4   Title Pt will traveres curb/ramp IND in order to safely walk dog.   Status Achieved           PT Long Term Goals - 10/07/16 1138      PT LONG TERM GOAL #1   Title Pt will verbalize understanding of fall prevention handouts to reduce risk of additional falls. TARGET DATE FOR ALL LTGS: 10/21/16   Status New     PT LONG TERM GOAL #2   Title Pt will improve DGI score to >/=22/24 to decr. falls risk.    Status Achieved     PT LONG TERM GOAL #3   Title Pt will amb. 1000' over even/uneven terrain, IND, in order to improve safety during functional mobility.    Status New     PT LONG TERM GOAL #4   Title Pt will report zero falls over the last 4 weeks to improve safety.   Status New     PT LONG TERM GOAL #5   Title Pt will perform 6MWT >/=1487' to improve endurance.    Status New               Plan - 10/09/16 1019    Clinical Impression Statement Pt demonstrated progress, as he was able to perform high level balance over compliant surfaces. Pt continues to require cues to improve lateral wt. shifting, especially during LLE SLS activities. Continue with POC.    Rehab Potential Good   Clinical Impairments Affecting Rehab Potential ankylosing spondylitis (see subjective above for PMH detail)   PT Frequency 2x / week   PT Duration 8 weeks   PT Treatment/Interventions ADLs/Self Care Home Management;Biofeedback;Moist Heat;Cryotherapy;Neuromuscular re-education;Balance  training;Therapeutic exercise;Therapeutic activities;Stair training;Functional mobility training;Gait training;DME Instruction;Patient/family education;Orthotic Fit/Training;Vestibular;Manual techniques   PT Next Visit Plan Continue Gait over uneven terrain, high level balance with emphasis on complaint surfaces and with single leg stance activities   Consulted and Agree with Plan of Care Patient      Patient will benefit from skilled therapeutic intervention in order to improve the following deficits and impairments:  Abnormal gait, Decreased endurance, Decreased knowledge of use of DME, Impaired flexibility, Postural dysfunction, Decreased safety awareness, Decreased range of motion, Decreased balance, Decreased  mobility, Decreased strength  Visit Diagnosis: Other abnormalities of gait and mobility  Abnormal posture     Problem List Patient Active Problem List   Diagnosis Date Noted  . History of adenomatous polyp of colon 08/05/2013  . Thoracic spine fracture ([place]) 05/23/2013  . Pulmonary embolism ([place]) 04/09/2012    Class: Acute  . Left leg DVT ([place]) 04/09/2012    Class: Acute  . Dyspnea on exertion 04/07/2012    Class: Acute  . Leg edema, left 04/07/2012    Class: Acute  . DIABETES, TYPE 2 04/12/2008  . HYPERLIPIDEMIA 04/12/2008  . HYPERTENSION 04/12/2008  . OSA (obstructive sleep apnea) 04/12/2008    Miller,Jennifer L 10/09/2016, 10:20 AM  [facility] [address] [place], [place], [address] Phone: [phone]   Fax:  [phone]  Name: Anthony Case MRN: [id number] Date of Birth: Oct 04, 1950  Geoffry Paradise, PT,DPT 10/09/16 10:21 AM Phone: [phone] Fax: [phone]

## 2016-10-14 ENCOUNTER — Ambulatory Visit: Payer: Medicare Other | Admitting: Physical Therapy

## 2016-10-14 ENCOUNTER — Encounter: Payer: Self-pay | Admitting: Physical Therapy

## 2016-10-14 DIAGNOSIS — R293 Abnormal posture: Secondary | ICD-10-CM | POA: Diagnosis not present

## 2016-10-14 DIAGNOSIS — R2689 Other abnormalities of gait and mobility: Secondary | ICD-10-CM

## 2016-10-14 NOTE — Therapy (Signed)
[facility] [address] [place], [place], [address] Phone: [phone]   Fax:  [phone]  Physical Therapy Treatment  Patient Details  Name: Anthony Case MRN: [id number] Date of Birth: 12/26/1950 Referring Provider: Dr. Philip Aspen  Encounter Date: 10/14/2016      PT End of Session - 10/14/16 0808    Visit Number 12   Number of Visits 17   Date for PT Re-Evaluation 10/25/16   Authorization Type MEDICARE G-CODE AND PROGRESS NOTE EVERY 10TH VISIT. BCBC Supp. ins. secondary   PT Start Time 0804   PT Stop Time 0845   PT Time Calculation (min) 41 min   Equipment Utilized During Treatment Gait belt   Activity Tolerance Patient tolerated treatment well   Behavior During Therapy WFL for tasks assessed/performed      Past Medical History:  Diagnosis Date  . Arthritis   . Diabetes mellitus   . Hx pulmonary embolism 2009   required emergent tPA treatment  . Hypertension   . Shortness of breath   . Sleep apnea     Past Surgical History:  Procedure Laterality Date  . BACK SURGERY    . COLONOSCOPY  2009   adenoma polyp  . ELBOW ARTHROSCOPY  2010  . LUMBAR PERCUTANEOUS PEDICLE SCREW 3 LEVEL N/A 05/25/2013   Procedure: Posterior Percutaneous Fixation from Thoracic nine to Lumbar one vertebrae with arthrodesis of Thoracic eleven Fracture;  Surgeon: Kristeen Miss, MD;  Location: [facility];  Service: Neurosurgery;  Laterality: N/A;  Posterior Percutaneous Fixation from Thoracic nine to Lumbar one vertebrae with arthrodesis of Thoracic eleven Fracture    There were no vitals filed for this visit.      Subjective Assessment - 10/14/16 0807    Subjective No new complaints. No falls to report. Having some hip pain.   Pertinent History HTN, R clavicle fx (per pt report, denied precautions), hx PE and LLE DVT, hyperlipidemia, DM, OSA, hx of Tx fx, arthritis, recent dx of ankylosing spondylitis per pt   Patient Stated  Goals Build endurance (on feet 12 hours at [employer] one a year for 10-12 years), walk further distances, improve balance and strength   Currently in Pain? Yes   Pain Score 3    Pain Location Hip   Pain Orientation Right;Anterior   Pain Descriptors / Indicators Aching;Sore;Discomfort   Pain Type Chronic pain   Pain Onset More than a month ago   Pain Frequency Intermittent   Aggravating Factors  certain movements, increased walking/activity   Pain Relieving Factors medication and rest            [facility] PT Assessment - 10/14/16 0811      Ambulation/Gait   Ambulation/Gait Yes   Ambulation/Gait Assistance 6: Modified independent (Device/Increase time)   Ambulation/Gait Assistance Details no cues needed, no loss of balance noted   Ambulation Distance (Feet) 1000 Feet   Assistive device None   Gait Pattern Step-through pattern;Decreased stride length;Decreased trunk rotation;Trunk flexed;Right flexed knee in stance;Left flexed knee in stance;Wide base of support   Ambulation Surface Level;Unlevel;Indoor;Outdoor;Paved  wet grass/gravel-did not go onto those surfaces     6 Minute Walk- Baseline   6 Minute Walk- Baseline yes   BP (mmHg) 155/90   HR (bpm) 85   02 Sat (%RA) 95 %   Modified Borg Scale for Dyspnea 0- Nothing at all   Perceived Rate of Exertion (Borg) 6-     6 Minute walk- Post Test  6 Minute Walk Post Test yes   BP (mmHg) (!)  173/92   HR (bpm) 125   02 Sat (%RA) 95 %   Modified Borg Scale for Dyspnea 2- Mild shortness of breath   Perceived Rate of Exertion (Borg) 11- Fairly light     6 minute walk test results    Aerobic Endurance Distance Walked 1362   Endurance additional comments no device, no rest breaks     Functional Gait  Assessment   Gait assessed  Yes   Gait Level Surface Walks 20 ft in less than 5.5 sec, no assistive devices, good speed, no evidence for imbalance, normal gait pattern, deviates no more than 6 in outside of the 12 in walkway  width.   Change in Gait Speed Able to smoothly change walking speed without loss of balance or gait deviation. Deviate no more than 6 in outside of the 12 in walkway width.   Gait with Horizontal Head Turns Performs head turns smoothly with no change in gait. Deviates no more than 6 in outside 12 in walkway width   Gait with Vertical Head Turns Performs head turns with no change in gait. Deviates no more than 6 in outside 12 in walkway width.   Gait and Pivot Turn Pivot turns safely within 3 sec and stops quickly with no loss of balance.   Step Over Obstacle Is able to step over 2 stacked shoe boxes taped together (9 in total height) without changing gait speed. No evidence of imbalance.   Gait with Narrow Base of Support Ambulates 7-9 steps.   Gait with Eyes Closed Walks 20 ft, uses assistive device, slower speed, mild gait deviations, deviates 6-10 in outside 12 in walkway width. Ambulates 20 ft in less than 9 sec but greater than 7 sec.   Ambulating Backwards Walks 20 ft, uses assistive device, slower speed, mild gait deviations, deviates 6-10 in outside 12 in walkway width.   Steps Alternating feet, no rail.   Total Score 27   FGA comment: 27/30 = low risk for falls             PT Short Term Goals - 10/07/16 1132      PT SHORT TERM GOAL #1   Title Pt will be IND in HEP to improve strength, balance, endurance, and flexibility. TARGET DATE FOR ALL STGS: 09/23/16   Status Partially Met     PT SHORT TERM GOAL #2   Title Pt will amb. 400' over even terrain, IND, without an AD to improve functional mobility.    Status Partially Met     PT SHORT TERM GOAL #3   Title Perform 6MWT and write goal as indicated.    Status Achieved     PT SHORT TERM GOAL #4   Title Pt will traveres curb/ramp IND in order to safely walk dog.   Status Achieved           PT Long Term Goals - 10/14/16 0810      PT LONG TERM GOAL #1   Title Pt will verbalize understanding of fall prevention handouts to  reduce risk of additional falls. TARGET DATE FOR ALL LTGS: 10/21/16   Status On-going     PT LONG TERM GOAL #2   Title Pt will improve DGI score to >/=22/24 to decr. falls risk.    Status Achieved     PT LONG TERM GOAL #3   Title Pt will amb. 1000' over even/uneven terrain, IND, in order to  improve safety during functional mobility.    Baseline 10/14/16: met today   Status Achieved     PT LONG TERM GOAL #4   Title Pt will report zero falls over the last 4 weeks to improve safety.   Baseline 10/14/16: met today   Status Achieved     PT LONG TERM GOAL #5   Title Pt will perform 6MWT >/=1487' to improve endurance.    Baseline 10/14/16: increased from last test to 1362 feet, not quite to goal   Status Partially Met            Plan - 10/14/16 6301    Clinical Impression Statement Pt has met or partially met most LTGs. Will plan to check remaining goals at next session.    Rehab Potential Good   Clinical Impairments Affecting Rehab Potential ankylosing spondylitis (see subjective above for PMH detail)   PT Frequency 2x / week   PT Duration 8 weeks   PT Treatment/Interventions ADLs/Self Care Home Management;Biofeedback;Moist Heat;Cryotherapy;Neuromuscular re-education;Balance training;Therapeutic exercise;Therapeutic activities;Stair training;Functional mobility training;Gait training;DME Instruction;Patient/family education;Orthotic Fit/Training;Vestibular;Manual techniques   PT Next Visit Plan assess remaining LTGs for anticipated discharge Anderson Malta, I did let him know that based on progress with functional tests performed today we would most likely discharge with him returning to community [employer]. He seemed okay with this)                 Consulted and Agree with Plan of Care Patient      Patient will benefit from skilled therapeutic intervention in order to improve the following deficits and impairments:  Abnormal gait, Decreased endurance, Decreased knowledge of use of DME,  Impaired flexibility, Postural dysfunction, Decreased safety awareness, Decreased range of motion, Decreased balance, Decreased mobility, Decreased strength  Visit Diagnosis: Other abnormalities of gait and mobility  Abnormal posture     Problem List Patient Active Problem List   Diagnosis Date Noted  . History of adenomatous polyp of colon 08/05/2013  . Thoracic spine fracture ([place]) 05/23/2013  . Pulmonary embolism ([place]) 04/09/2012    Class: Acute  . Left leg DVT ([place]) 04/09/2012    Class: Acute  . Dyspnea on exertion 04/07/2012    Class: Acute  . Leg edema, left 04/07/2012    Class: Acute  . DIABETES, TYPE 2 04/12/2008  . HYPERLIPIDEMIA 04/12/2008  . HYPERTENSION 04/12/2008  . OSA (obstructive sleep apnea) 04/12/2008    Willow Ora, PTA, [facility] [address] [place], [place] [address] [phone] 10/14/16, 10:04 AM   Name: Anthony Case MRN: [id number] Date of Birth: 1950-12-26

## 2016-10-16 ENCOUNTER — Ambulatory Visit: Payer: Medicare Other

## 2016-10-16 DIAGNOSIS — R2689 Other abnormalities of gait and mobility: Secondary | ICD-10-CM | POA: Diagnosis not present

## 2016-10-16 DIAGNOSIS — R293 Abnormal posture: Secondary | ICD-10-CM

## 2016-10-16 NOTE — Therapy (Signed)
[facility] [address] [place], [place], [address] Phone: [phone]   Fax:  [phone]  Physical Therapy Treatment  Patient Details  Name: Anthony Case MRN: [id number] Date of Birth: 04/01/1951 Referring Provider: Dr. Philip Aspen  Encounter Date: 10/16/2016      PT End of Session - 10/16/16 0953    Visit Number 13   Number of Visits 17   Date for PT Re-Evaluation 10/25/16   Authorization Type MEDICARE G-CODE AND PROGRESS NOTE EVERY 10TH VISIT. BCBC Supp. ins. secondary   PT Start Time 0933   PT Stop Time 0949  d/c   PT Time Calculation (min) 16 min   Activity Tolerance Patient tolerated treatment well   Behavior During Therapy WFL for tasks assessed/performed      Past Medical History:  Diagnosis Date  . Arthritis   . Diabetes mellitus   . Hx pulmonary embolism 2009   required emergent tPA treatment  . Hypertension   . Shortness of breath   . Sleep apnea     Past Surgical History:  Procedure Laterality Date  . BACK SURGERY    . COLONOSCOPY  2009   adenoma polyp  . ELBOW ARTHROSCOPY  2010  . LUMBAR PERCUTANEOUS PEDICLE SCREW 3 LEVEL N/A 05/25/2013   Procedure: Posterior Percutaneous Fixation from Thoracic nine to Lumbar one vertebrae with arthrodesis of Thoracic eleven Fracture;  Surgeon: Kristeen Miss, MD;  Location: [facility];  Service: Neurosurgery;  Laterality: N/A;  Posterior Percutaneous Fixation from Thoracic nine to Lumbar one vertebrae with arthrodesis of Thoracic eleven Fracture    There were no vitals filed for this visit.      Subjective Assessment - 10/16/16 0957    Subjective Pt denied falls or changes since last visit.   Pertinent History HTN, R clavicle fx (per pt report, denied precautions), hx PE and LLE DVT, hyperlipidemia, DM, OSA, hx of Tx fx, arthritis, recent dx of ankylosing spondylitis per pt   Patient Stated Goals Build endurance (on feet 12 hours at [employer]  one a year for 10-12 years), walk further distances, improve balance and strength   Currently in Pain? No/denies   Pain Onset More than a month ago                            Self Care.     PT Education - 10/16/16 [phone]    Education provided Yes   Education Details PT discussed goal progress and reviewed HEP. PT discussed pt's excellent progress and d/c from physical therapy. Pt agreeable and plans to join [employer] to continue to maintain gains made during PT. See pt instructions for details.    Person(s) Educated Patient   Methods Explanation   Comprehension Verbalized understanding          PT Short Term Goals - 10/07/16 1132      PT SHORT TERM GOAL #1   Title Pt will be IND in HEP to improve strength, balance, endurance, and flexibility. TARGET DATE FOR ALL STGS: 09/23/16   Status Partially Met     PT SHORT TERM GOAL #2   Title Pt will amb. 400' over even terrain, IND, without an AD to improve functional mobility.    Status Partially Met     PT SHORT TERM GOAL #3   Title Perform 6MWT and write goal as indicated.    Status Achieved     PT SHORT TERM GOAL #  4   Title Pt will traveres curb/ramp IND in order to safely walk dog.   Status Achieved           PT Long Term Goals - 10/21/16 0954      PT LONG TERM GOAL #1   Title Pt will verbalize understanding of fall prevention handouts to reduce risk of additional falls. TARGET DATE FOR ALL LTGS: 10/21/16   Status Achieved     PT LONG TERM GOAL #2   Title Pt will improve DGI score to >/=22/24 to decr. falls risk.    Status Achieved     PT LONG TERM GOAL #3   Title Pt will amb. 1000' over even/uneven terrain, IND, in order to improve safety during functional mobility.    Baseline 10/14/16: met today   Status Achieved     PT LONG TERM GOAL #4   Title Pt will report zero falls over the last 4 weeks to improve safety.   Baseline 10/14/16: met today   Status Achieved     PT LONG TERM GOAL #5   Title  Pt will perform 6MWT >/=1487' to improve endurance.    Baseline 10/14/16: increased from last test to 1362 feet, not quite to goal   Status Partially Met               Plan - 10/21/2016 0953    Clinical Impression Statement Pt met LTG 1. Please see pt d/c summary for details.    Rehab Potential Good   Clinical Impairments Affecting Rehab Potential ankylosing spondylitis (see subjective above for PMH detail)   PT Frequency 2x / week   PT Duration 8 weeks   PT Treatment/Interventions ADLs/Self Care Home Management;Biofeedback;Moist Heat;Cryotherapy;Neuromuscular re-education;Balance training;Therapeutic exercise;Therapeutic activities;Stair training;Functional mobility training;Gait training;DME Instruction;Patient/family education;Orthotic Fit/Training;Vestibular;Manual techniques   PT Next Visit Plan d/c   Consulted and Agree with Plan of Care Patient      Patient will benefit from skilled therapeutic intervention in order to improve the following deficits and impairments:  Abnormal gait, Decreased endurance, Decreased knowledge of use of DME, Impaired flexibility, Postural dysfunction, Decreased safety awareness, Decreased range of motion, Decreased balance, Decreased mobility, Decreased strength  Visit Diagnosis: Other abnormalities of gait and mobility  Abnormal posture       G-Codes - Oct 21, 2016 0954    Functional Assessment Tool Used (Outpatient Only) DGI: 23/24 and gait speed no AD: 3.53f/sec., FGA: 27/30 (DGI and gait speed used from 1 week ago but PT updated FGA performed on 10/14/16)   Functional Limitation Mobility: Walking and moving around   Mobility: Walking and Moving Around Goal Status [phone] At least 1 percent but less than 20 percent impaired, limited or restricted   Mobility: Walking and Moving Around Discharge Status [phone] At least 1 percent but less than 20 percent impaired, limited or restricted      Problem List Patient Active Problem List   Diagnosis  Date Noted  . History of adenomatous polyp of colon 08/05/2013  . [place] 05/23/2013  . [place] 04/09[place]2012    Class: Acute  . [place]    Class: Acute  . Dyspnea on exertion 04/07/2012    Class: Acute  . Leg edema, left 04/07/2012    Class: Acute  . DIABETES, TYPE 2 04/12/2008  . HYPERLIPIDEMIA 04/12/2008  . HYPERTENSION 04/12/2008  . OSA (obstructive sleep apnea) 04/12/2008    Citlally Captain L 2Feb 27, 2018 9:58 AM  [id number]  [place], [place], [address] Phone: [phone]   Fax:  [phone]  Name: Anthony Case MRN: [id number] Date of Birth: 06/04/1951  PHYSICAL THERAPY DISCHARGE SUMMARY  Visits from Start of Care: 13  Current functional level related to goals / functional outcomes:     PT Long Term Goals - 10/16/16 0954      PT LONG TERM GOAL #1   Title Pt will verbalize understanding of fall prevention handouts to reduce risk of additional falls. TARGET DATE FOR ALL LTGS: 10/21/16   Status Achieved     PT LONG TERM GOAL #2   Title Pt will improve DGI score to >/=22/24 to decr. falls risk.    Status Achieved     PT LONG TERM GOAL #3   Title Pt will amb. 1000' over even/uneven terrain, IND, in order to improve safety during functional mobility.    Baseline 10/14/16: met today   Status Achieved     PT LONG TERM GOAL #4   Title Pt will report zero falls over the last 4 weeks to improve safety.   Baseline 10/14/16: met today   Status Achieved     PT LONG TERM GOAL #5   Title Pt will perform 6MWT >/=1487' to improve endurance.    Baseline 10/14/16: increased from last test to 1362 feet, not quite to goal   Status Partially Met        Remaining deficits: Decreased endurance.   Education / Equipment: HEP  Plan: Patient agrees to discharge.  Patient goals were met. Patient is being discharged due to meeting the stated  rehab goals.  ?????         Geoffry Paradise, PT,DPT 10/16/16 9:59 AM Phone: [phone] Fax: [phone]

## 2016-10-16 NOTE — Patient Instructions (Signed)
Bridge    Lying on back, legs bent 90, feet flat on floor. Press up hips and torso, while tucking in stomach. Hold for 5 seconds. Then slowly lower hips back down. Perform 2 sets of 10 reps. 3 days a week. Do not fully press hips all the way up into extension, in order to support back.   Copyright  VHI. All rights reserved.   Abduction: Clam (Eccentric) - Side-Lying    Tie blue band above knees. Lie on side with knees bent. Lift top knee, keeping feet together. Keep trunk steady. Slowly lower leg. _10__ reps per set, _3__ sets per day, _3__ days per week. Repeat on other side.  http://ecce.exer.us/64   Copyright  VHI. All rights reserved.   Walking on Heels   At counter: SLOWLY. Walk on heels forward while continuing on a straight path, and then walk on heels backward to starting position. Repeat for 3 laps each way. Do _1-2__ sessions per day.  Copyright  VHI. All rights reserved.  Walking on Toes   At counter for balance as needed: Walk SLOWLY on toes forward while continuing on a straight path, and then backwards on toes to starting position. Repeat 3 laps each way. Do _1___ sessions per day.  Copyright  VHI. All rights reserved.  Functional Quadriceps: Sit to Stand    PERFORM FROM A SOFA WITH A BIG CUSHION. Sit on edge of chair, feet flat on floor. Stand upright, extending knees fully. Repeat __10__ times per set. Do __3__ sets per session. Do __3-4__ sessions per week. http://orth.exer.us/734  Copyright  VHI. All rights reserved.    Perform at counter with hand support as needed: Backward    Walk backwards with eyes open. Take BIG even steps, making sure each foot lifts off floor. Repeat for __4__ laps per session. Do __1__ sessions per day.  Copyright  VHI. All rights reserved.    Feet Together (Compliant Surface) Head Motion - Eyes Closed    Stand on compliant surface: with feet together. Close eyes and move head slowly, up  and down. Repeat 10 times per session. Do 1 sessions per day, 5 days per week  Copyright  VHI. All rights reserved.    PROGRESS TO THIS EXERCISES, ONCE PARTIAL HEEL TO TO BECOME EASY Feet Partial Heel-Toe (Compliant Surface) Head Motion - Eyes Closed    Stand on compliant surface: with right foot partially in front of the other. Close eyes and move head slowly, up and down. After 10 reps, switch feet to left foot in front. Repeat 10 times per session. Do 1 sessions per day, 5 days per week  Copyright  VHI. All rights reserved.Feet Partial Heel-Toe (Compliant Surface) Head Motion - Eyes Open    With eyes open, standing on compliant surface: _PILLOWS_______, right foot partially in front of the other, move head slowly: up and down 5 TIMES AND SIDE TO SIDE 5 TIMES. Repeat __3__ times per session. Do _1___ sessions per day.  Copyright  VHI. All rights reserved.

## 2016-10-20 DIAGNOSIS — E784 Other hyperlipidemia: Secondary | ICD-10-CM | POA: Diagnosis not present

## 2016-10-20 DIAGNOSIS — R2681 Unsteadiness on feet: Secondary | ICD-10-CM | POA: Diagnosis not present

## 2016-10-20 DIAGNOSIS — G4733 Obstructive sleep apnea (adult) (pediatric): Secondary | ICD-10-CM | POA: Diagnosis not present

## 2016-10-20 DIAGNOSIS — E1140 Type 2 diabetes mellitus with diabetic neuropathy, unspecified: Secondary | ICD-10-CM | POA: Diagnosis not present

## 2016-10-20 DIAGNOSIS — Z1389 Encounter for screening for other disorder: Secondary | ICD-10-CM | POA: Diagnosis not present

## 2016-10-20 DIAGNOSIS — E1129 Type 2 diabetes mellitus with other diabetic kidney complication: Secondary | ICD-10-CM | POA: Diagnosis not present

## 2016-10-20 DIAGNOSIS — Z6839 Body mass index (BMI) 39.0-39.9, adult: Secondary | ICD-10-CM | POA: Diagnosis not present

## 2016-10-20 DIAGNOSIS — I10 Essential (primary) hypertension: Secondary | ICD-10-CM | POA: Diagnosis not present

## 2016-10-20 DIAGNOSIS — N528 Other male erectile dysfunction: Secondary | ICD-10-CM | POA: Diagnosis not present

## 2016-10-20 DIAGNOSIS — L4050 Arthropathic psoriasis, unspecified: Secondary | ICD-10-CM | POA: Diagnosis not present

## 2016-11-27 DIAGNOSIS — Z79899 Other long term (current) drug therapy: Secondary | ICD-10-CM | POA: Diagnosis not present

## 2016-11-27 DIAGNOSIS — L4050 Arthropathic psoriasis, unspecified: Secondary | ICD-10-CM | POA: Diagnosis not present

## 2017-01-16 DIAGNOSIS — E669 Obesity, unspecified: Secondary | ICD-10-CM | POA: Diagnosis not present

## 2017-01-16 DIAGNOSIS — I10 Essential (primary) hypertension: Secondary | ICD-10-CM | POA: Diagnosis not present

## 2017-01-16 DIAGNOSIS — G4733 Obstructive sleep apnea (adult) (pediatric): Secondary | ICD-10-CM | POA: Diagnosis not present

## 2017-01-16 DIAGNOSIS — L409 Psoriasis, unspecified: Secondary | ICD-10-CM | POA: Diagnosis not present

## 2017-01-16 DIAGNOSIS — Z6838 Body mass index (BMI) 38.0-38.9, adult: Secondary | ICD-10-CM | POA: Diagnosis not present

## 2017-01-16 DIAGNOSIS — E1140 Type 2 diabetes mellitus with diabetic neuropathy, unspecified: Secondary | ICD-10-CM | POA: Diagnosis not present

## 2017-01-16 DIAGNOSIS — L4050 Arthropathic psoriasis, unspecified: Secondary | ICD-10-CM | POA: Diagnosis not present

## 2017-01-16 DIAGNOSIS — R2681 Unsteadiness on feet: Secondary | ICD-10-CM | POA: Diagnosis not present

## 2017-01-16 DIAGNOSIS — E1129 Type 2 diabetes mellitus with other diabetic kidney complication: Secondary | ICD-10-CM | POA: Diagnosis not present

## 2017-01-16 DIAGNOSIS — M2550 Pain in unspecified joint: Secondary | ICD-10-CM | POA: Diagnosis not present

## 2017-01-16 DIAGNOSIS — Z6841 Body Mass Index (BMI) 40.0 and over, adult: Secondary | ICD-10-CM | POA: Diagnosis not present

## 2017-01-16 DIAGNOSIS — Z79899 Other long term (current) drug therapy: Secondary | ICD-10-CM | POA: Diagnosis not present

## 2017-01-16 DIAGNOSIS — E784 Other hyperlipidemia: Secondary | ICD-10-CM | POA: Diagnosis not present

## 2017-01-21 ENCOUNTER — Encounter: Payer: Self-pay | Admitting: Podiatry

## 2017-01-21 ENCOUNTER — Ambulatory Visit (INDEPENDENT_AMBULATORY_CARE_PROVIDER_SITE_OTHER): Payer: Medicare Other | Admitting: Podiatry

## 2017-01-21 DIAGNOSIS — M205X9 Other deformities of toe(s) (acquired), unspecified foot: Secondary | ICD-10-CM

## 2017-01-21 DIAGNOSIS — M79674 Pain in right toe(s): Secondary | ICD-10-CM | POA: Diagnosis not present

## 2017-01-21 DIAGNOSIS — B351 Tinea unguium: Secondary | ICD-10-CM

## 2017-01-21 DIAGNOSIS — E1149 Type 2 diabetes mellitus with other diabetic neurological complication: Secondary | ICD-10-CM

## 2017-01-21 DIAGNOSIS — M2020 Hallux rigidus, unspecified foot: Secondary | ICD-10-CM

## 2017-01-21 NOTE — Progress Notes (Addendum)
Complaint:  Visit Type: Patient presents  to my office for preventative foot care services. Complaint: Patient states" my nails left  great toenail was injured and has grown back thick and unsightly. Patient has been diagnosed with DM with diminished LOPS. The patient presents for preventative foot care services.   Podiatric Exam: Vascular: dorsalis pedis and posterior tibial pulses are palpable bilateral. Capillary return is immediate. Temperature gradient is WNL. Skin turgor WNL  Sensorium: Absent   Semmes Weinstein monofilament test. Normal tactile sensation bilaterally. Nail Exam: Pt has thick disfigured discolored nails with subungual debris noted left  entire nail hallux Ulcer Exam: There is no evidence of ulcer or pre-ulcerative changes or infection. Orthopedic Exam: Muscle tone and strength are WNL. No limitations in general ROM. No crepitus or effusions noted. Foot type and digits show no abnormalities. Bony prominences are unremarkable. Pes planus foot type. Skin: No Porokeratosis. No infection or ulcers  Diagnosis:  Onychomycosis, , Pain in left great toe, DPN  Pes planus,  Callus,  Diabetes with neuropathy  Treatment & Plan Procedures and Treatment: Consent by patient was obtained for treatment procedures. The patient understood the discussion of treatment and procedures well. All questions were answered thoroughly reviewed.  Initial exam. Debridement of mycotic and hypertrophic toenails, 1 through 5 bilateral and clearing of subungual debris. No ulceration, no infection noted. Initiate diabetic shoe paperwork for DPN and DJD. To talk with Liliane Channel for diabetic shoes. Return Visit-Office Procedure: Patient instructed to return to the office for a follow up visit 3 months for continued evaluation and treatment.    Gardiner Barefoot DPM

## 2017-01-22 DIAGNOSIS — Z79899 Other long term (current) drug therapy: Secondary | ICD-10-CM | POA: Diagnosis not present

## 2017-01-22 DIAGNOSIS — L4050 Arthropathic psoriasis, unspecified: Secondary | ICD-10-CM | POA: Diagnosis not present

## 2017-02-16 DIAGNOSIS — S93601A Unspecified sprain of right foot, initial encounter: Secondary | ICD-10-CM | POA: Diagnosis not present

## 2017-04-01 DIAGNOSIS — Z79899 Other long term (current) drug therapy: Secondary | ICD-10-CM | POA: Diagnosis not present

## 2017-04-01 DIAGNOSIS — L4050 Arthropathic psoriasis, unspecified: Secondary | ICD-10-CM | POA: Diagnosis not present

## 2017-05-22 DIAGNOSIS — I10 Essential (primary) hypertension: Secondary | ICD-10-CM | POA: Diagnosis not present

## 2017-05-22 DIAGNOSIS — M810 Age-related osteoporosis without current pathological fracture: Secondary | ICD-10-CM | POA: Diagnosis not present

## 2017-05-22 DIAGNOSIS — L4050 Arthropathic psoriasis, unspecified: Secondary | ICD-10-CM | POA: Diagnosis not present

## 2017-05-22 DIAGNOSIS — Z6841 Body Mass Index (BMI) 40.0 and over, adult: Secondary | ICD-10-CM | POA: Diagnosis not present

## 2017-05-22 DIAGNOSIS — Z23 Encounter for immunization: Secondary | ICD-10-CM | POA: Diagnosis not present

## 2017-05-22 DIAGNOSIS — E1140 Type 2 diabetes mellitus with diabetic neuropathy, unspecified: Secondary | ICD-10-CM | POA: Diagnosis not present

## 2017-05-27 DIAGNOSIS — Z79899 Other long term (current) drug therapy: Secondary | ICD-10-CM | POA: Diagnosis not present

## 2017-05-27 DIAGNOSIS — L4050 Arthropathic psoriasis, unspecified: Secondary | ICD-10-CM | POA: Diagnosis not present

## 2017-07-13 DIAGNOSIS — R310 Gross hematuria: Secondary | ICD-10-CM | POA: Diagnosis not present

## 2017-07-13 DIAGNOSIS — R109 Unspecified abdominal pain: Secondary | ICD-10-CM | POA: Diagnosis not present

## 2017-07-13 DIAGNOSIS — N202 Calculus of kidney with calculus of ureter: Secondary | ICD-10-CM | POA: Diagnosis not present

## 2017-07-21 DIAGNOSIS — Z79899 Other long term (current) drug therapy: Secondary | ICD-10-CM | POA: Diagnosis not present

## 2017-07-21 DIAGNOSIS — E669 Obesity, unspecified: Secondary | ICD-10-CM | POA: Diagnosis not present

## 2017-07-21 DIAGNOSIS — L4050 Arthropathic psoriasis, unspecified: Secondary | ICD-10-CM | POA: Diagnosis not present

## 2017-07-21 DIAGNOSIS — M2550 Pain in unspecified joint: Secondary | ICD-10-CM | POA: Diagnosis not present

## 2017-07-21 DIAGNOSIS — Z6838 Body mass index (BMI) 38.0-38.9, adult: Secondary | ICD-10-CM | POA: Diagnosis not present

## 2017-07-21 DIAGNOSIS — L409 Psoriasis, unspecified: Secondary | ICD-10-CM | POA: Diagnosis not present

## 2017-07-22 DIAGNOSIS — Z79899 Other long term (current) drug therapy: Secondary | ICD-10-CM | POA: Diagnosis not present

## 2017-07-22 DIAGNOSIS — L4050 Arthropathic psoriasis, unspecified: Secondary | ICD-10-CM | POA: Diagnosis not present

## 2017-07-27 DIAGNOSIS — N200 Calculus of kidney: Secondary | ICD-10-CM | POA: Diagnosis not present

## 2017-08-04 DIAGNOSIS — I2699 Other pulmonary embolism without acute cor pulmonale: Secondary | ICD-10-CM | POA: Diagnosis not present

## 2017-08-04 DIAGNOSIS — L4050 Arthropathic psoriasis, unspecified: Secondary | ICD-10-CM | POA: Diagnosis not present

## 2017-08-04 DIAGNOSIS — I10 Essential (primary) hypertension: Secondary | ICD-10-CM | POA: Diagnosis not present

## 2017-08-04 DIAGNOSIS — Z6841 Body Mass Index (BMI) 40.0 and over, adult: Secondary | ICD-10-CM | POA: Diagnosis not present

## 2017-08-04 DIAGNOSIS — M459 Ankylosing spondylitis of unspecified sites in spine: Secondary | ICD-10-CM | POA: Diagnosis not present

## 2017-08-04 DIAGNOSIS — R2681 Unsteadiness on feet: Secondary | ICD-10-CM | POA: Diagnosis not present

## 2017-08-04 DIAGNOSIS — E7849 Other hyperlipidemia: Secondary | ICD-10-CM | POA: Diagnosis not present

## 2017-08-04 DIAGNOSIS — G4733 Obstructive sleep apnea (adult) (pediatric): Secondary | ICD-10-CM | POA: Diagnosis not present

## 2017-08-04 DIAGNOSIS — E1140 Type 2 diabetes mellitus with diabetic neuropathy, unspecified: Secondary | ICD-10-CM | POA: Diagnosis not present

## 2017-08-04 DIAGNOSIS — Z794 Long term (current) use of insulin: Secondary | ICD-10-CM | POA: Diagnosis not present

## 2017-08-10 DIAGNOSIS — E113299 Type 2 diabetes mellitus with mild nonproliferative diabetic retinopathy without macular edema, unspecified eye: Secondary | ICD-10-CM | POA: Diagnosis not present

## 2017-08-27 DIAGNOSIS — N200 Calculus of kidney: Secondary | ICD-10-CM | POA: Diagnosis not present

## 2017-09-15 DIAGNOSIS — Z794 Long term (current) use of insulin: Secondary | ICD-10-CM | POA: Diagnosis not present

## 2017-09-15 DIAGNOSIS — G4733 Obstructive sleep apnea (adult) (pediatric): Secondary | ICD-10-CM | POA: Diagnosis not present

## 2017-09-15 DIAGNOSIS — Z6841 Body Mass Index (BMI) 40.0 and over, adult: Secondary | ICD-10-CM | POA: Diagnosis not present

## 2017-09-15 DIAGNOSIS — E1129 Type 2 diabetes mellitus with other diabetic kidney complication: Secondary | ICD-10-CM | POA: Diagnosis not present

## 2017-09-15 DIAGNOSIS — I10 Essential (primary) hypertension: Secondary | ICD-10-CM | POA: Diagnosis not present

## 2017-09-15 DIAGNOSIS — E7849 Other hyperlipidemia: Secondary | ICD-10-CM | POA: Diagnosis not present

## 2017-09-15 DIAGNOSIS — Z79899 Other long term (current) drug therapy: Secondary | ICD-10-CM | POA: Diagnosis not present

## 2017-09-15 DIAGNOSIS — Z1389 Encounter for screening for other disorder: Secondary | ICD-10-CM | POA: Diagnosis not present

## 2017-09-15 DIAGNOSIS — L4050 Arthropathic psoriasis, unspecified: Secondary | ICD-10-CM | POA: Diagnosis not present

## 2017-11-10 DIAGNOSIS — L4050 Arthropathic psoriasis, unspecified: Secondary | ICD-10-CM | POA: Diagnosis not present

## 2017-12-03 ENCOUNTER — Other Ambulatory Visit (HOSPITAL_COMMUNITY): Payer: Self-pay | Admitting: Internal Medicine

## 2017-12-03 ENCOUNTER — Ambulatory Visit (HOSPITAL_COMMUNITY)
Admission: RE | Admit: 2017-12-03 | Discharge: 2017-12-03 | Disposition: A | Discharge status: Home / Self Care | Payer: Medicare Other | Source: Ambulatory Visit | Attending: Internal Medicine | Admitting: Internal Medicine

## 2017-12-03 DIAGNOSIS — R0600 Dyspnea, unspecified: Secondary | ICD-10-CM | POA: Diagnosis not present

## 2017-12-03 DIAGNOSIS — R609 Edema, unspecified: Secondary | ICD-10-CM | POA: Diagnosis not present

## 2017-12-03 DIAGNOSIS — R600 Localized edema: Secondary | ICD-10-CM | POA: Diagnosis not present

## 2017-12-03 DIAGNOSIS — I82502 Chronic embolism and thrombosis of unspecified deep veins of left lower extremity: Secondary | ICD-10-CM | POA: Insufficient documentation

## 2017-12-03 DIAGNOSIS — R05 Cough: Secondary | ICD-10-CM | POA: Diagnosis not present

## 2017-12-03 DIAGNOSIS — M7989 Other specified soft tissue disorders: Secondary | ICD-10-CM | POA: Insufficient documentation

## 2017-12-03 NOTE — Progress Notes (Signed)
Bilateral lower extremity venous duplex completed. Right - There is no evidence of DVT, superficial thrombosis, Or Baker's cyst. 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 There is not evidence of a superficial thrombosis or Baker's cyst. Toma Copier, RVS 12/03/2017 4:03 pm

## 2017-12-04 ENCOUNTER — Telehealth: Payer: Self-pay

## 2017-12-04 NOTE — Telephone Encounter (Signed)
SENT REFERRAL TO SCHEDULING 

## 2017-12-07 DIAGNOSIS — R82998 Other abnormal findings in urine: Secondary | ICD-10-CM | POA: Diagnosis not present

## 2017-12-07 DIAGNOSIS — Z125 Encounter for screening for malignant neoplasm of prostate: Secondary | ICD-10-CM | POA: Diagnosis not present

## 2017-12-07 DIAGNOSIS — E7849 Other hyperlipidemia: Secondary | ICD-10-CM | POA: Diagnosis not present

## 2017-12-07 DIAGNOSIS — E118 Type 2 diabetes mellitus with unspecified complications: Secondary | ICD-10-CM | POA: Diagnosis not present

## 2017-12-07 DIAGNOSIS — M810 Age-related osteoporosis without current pathological fracture: Secondary | ICD-10-CM | POA: Diagnosis not present

## 2017-12-07 DIAGNOSIS — I10 Essential (primary) hypertension: Secondary | ICD-10-CM | POA: Diagnosis not present

## 2017-12-08 DIAGNOSIS — R0600 Dyspnea, unspecified: Secondary | ICD-10-CM | POA: Diagnosis not present

## 2017-12-08 DIAGNOSIS — R05 Cough: Secondary | ICD-10-CM | POA: Diagnosis not present

## 2017-12-08 DIAGNOSIS — R600 Localized edema: Secondary | ICD-10-CM | POA: Diagnosis not present

## 2017-12-14 DIAGNOSIS — E7849 Other hyperlipidemia: Secondary | ICD-10-CM | POA: Diagnosis not present

## 2017-12-14 DIAGNOSIS — L4050 Arthropathic psoriasis, unspecified: Secondary | ICD-10-CM | POA: Diagnosis not present

## 2017-12-14 DIAGNOSIS — E1140 Type 2 diabetes mellitus with diabetic neuropathy, unspecified: Secondary | ICD-10-CM | POA: Diagnosis not present

## 2017-12-14 DIAGNOSIS — Z794 Long term (current) use of insulin: Secondary | ICD-10-CM | POA: Diagnosis not present

## 2017-12-14 DIAGNOSIS — R2681 Unsteadiness on feet: Secondary | ICD-10-CM | POA: Diagnosis not present

## 2017-12-14 DIAGNOSIS — M810 Age-related osteoporosis without current pathological fracture: Secondary | ICD-10-CM | POA: Diagnosis not present

## 2017-12-14 DIAGNOSIS — G4733 Obstructive sleep apnea (adult) (pediatric): Secondary | ICD-10-CM | POA: Diagnosis not present

## 2017-12-14 DIAGNOSIS — R0609 Other forms of dyspnea: Secondary | ICD-10-CM | POA: Diagnosis not present

## 2017-12-14 DIAGNOSIS — Z0000 Encounter for general adult medical examination without abnormal findings: Secondary | ICD-10-CM | POA: Diagnosis not present

## 2017-12-14 DIAGNOSIS — Z1389 Encounter for screening for other disorder: Secondary | ICD-10-CM | POA: Diagnosis not present

## 2017-12-14 DIAGNOSIS — Z6839 Body mass index (BMI) 39.0-39.9, adult: Secondary | ICD-10-CM | POA: Diagnosis not present

## 2017-12-15 DIAGNOSIS — Z1212 Encounter for screening for malignant neoplasm of rectum: Secondary | ICD-10-CM | POA: Diagnosis not present

## 2017-12-16 ENCOUNTER — Ambulatory Visit
Admission: RE | Admit: 2017-12-16 | Discharge: 2017-12-16 | Disposition: A | Discharge status: Home / Self Care | Payer: Medicare Other | Source: Ambulatory Visit | Attending: Cardiology | Admitting: Cardiology

## 2017-12-16 ENCOUNTER — Other Ambulatory Visit (HOSPITAL_COMMUNITY): Payer: Self-pay | Admitting: Cardiology

## 2017-12-16 DIAGNOSIS — R0609 Other forms of dyspnea: Secondary | ICD-10-CM | POA: Diagnosis not present

## 2017-12-16 DIAGNOSIS — R0602 Shortness of breath: Secondary | ICD-10-CM

## 2017-12-16 DIAGNOSIS — Z86718 Personal history of other venous thrombosis and embolism: Secondary | ICD-10-CM | POA: Diagnosis not present

## 2017-12-16 DIAGNOSIS — I10 Essential (primary) hypertension: Secondary | ICD-10-CM | POA: Diagnosis not present

## 2017-12-16 DIAGNOSIS — Z86711 Personal history of pulmonary embolism: Secondary | ICD-10-CM | POA: Diagnosis not present

## 2017-12-16 MED ORDER — IOPAMIDOL (ISOVUE-370) INJECTION 76%
75.0000 mL | Freq: Once | INTRAVENOUS | Status: AC | PRN
  Administered 2017-12-16: 75 mL via INTRAVENOUS

## 2017-12-18 DIAGNOSIS — R0609 Other forms of dyspnea: Secondary | ICD-10-CM | POA: Diagnosis not present

## 2017-12-18 DIAGNOSIS — Z86711 Personal history of pulmonary embolism: Secondary | ICD-10-CM | POA: Diagnosis not present

## 2017-12-25 DIAGNOSIS — Z86711 Personal history of pulmonary embolism: Secondary | ICD-10-CM | POA: Diagnosis not present

## 2017-12-25 DIAGNOSIS — R0609 Other forms of dyspnea: Secondary | ICD-10-CM | POA: Diagnosis not present

## 2018-01-05 DIAGNOSIS — L4050 Arthropathic psoriasis, unspecified: Secondary | ICD-10-CM | POA: Diagnosis not present

## 2018-01-05 DIAGNOSIS — Z79899 Other long term (current) drug therapy: Secondary | ICD-10-CM | POA: Diagnosis not present

## 2018-01-07 DIAGNOSIS — Z86718 Personal history of other venous thrombosis and embolism: Secondary | ICD-10-CM | POA: Diagnosis not present

## 2018-01-07 DIAGNOSIS — I10 Essential (primary) hypertension: Secondary | ICD-10-CM | POA: Diagnosis not present

## 2018-01-07 DIAGNOSIS — Z86711 Personal history of pulmonary embolism: Secondary | ICD-10-CM | POA: Diagnosis not present

## 2018-01-07 DIAGNOSIS — R0609 Other forms of dyspnea: Secondary | ICD-10-CM | POA: Diagnosis not present

## 2018-01-12 DIAGNOSIS — M2550 Pain in unspecified joint: Secondary | ICD-10-CM | POA: Diagnosis not present

## 2018-01-12 DIAGNOSIS — M545 Low back pain: Secondary | ICD-10-CM | POA: Diagnosis not present

## 2018-01-12 DIAGNOSIS — E669 Obesity, unspecified: Secondary | ICD-10-CM | POA: Diagnosis not present

## 2018-01-12 DIAGNOSIS — Z79899 Other long term (current) drug therapy: Secondary | ICD-10-CM | POA: Diagnosis not present

## 2018-01-12 DIAGNOSIS — L409 Psoriasis, unspecified: Secondary | ICD-10-CM | POA: Diagnosis not present

## 2018-01-12 DIAGNOSIS — Z6836 Body mass index (BMI) 36.0-36.9, adult: Secondary | ICD-10-CM | POA: Diagnosis not present

## 2018-01-12 DIAGNOSIS — L4050 Arthropathic psoriasis, unspecified: Secondary | ICD-10-CM | POA: Diagnosis not present

## 2018-01-25 DIAGNOSIS — R262 Difficulty in walking, not elsewhere classified: Secondary | ICD-10-CM | POA: Diagnosis not present

## 2018-01-25 DIAGNOSIS — R531 Weakness: Secondary | ICD-10-CM | POA: Diagnosis not present

## 2018-01-25 DIAGNOSIS — M169 Osteoarthritis of hip, unspecified: Secondary | ICD-10-CM | POA: Diagnosis not present

## 2018-01-25 DIAGNOSIS — M545 Low back pain: Secondary | ICD-10-CM | POA: Diagnosis not present

## 2018-01-28 DIAGNOSIS — M545 Low back pain: Secondary | ICD-10-CM | POA: Diagnosis not present

## 2018-01-28 DIAGNOSIS — R262 Difficulty in walking, not elsewhere classified: Secondary | ICD-10-CM | POA: Diagnosis not present

## 2018-01-28 DIAGNOSIS — M169 Osteoarthritis of hip, unspecified: Secondary | ICD-10-CM | POA: Diagnosis not present

## 2018-01-28 DIAGNOSIS — R531 Weakness: Secondary | ICD-10-CM | POA: Diagnosis not present

## 2018-02-05 DIAGNOSIS — R262 Difficulty in walking, not elsewhere classified: Secondary | ICD-10-CM | POA: Diagnosis not present

## 2018-02-05 DIAGNOSIS — R531 Weakness: Secondary | ICD-10-CM | POA: Diagnosis not present

## 2018-02-05 DIAGNOSIS — M169 Osteoarthritis of hip, unspecified: Secondary | ICD-10-CM | POA: Diagnosis not present

## 2018-02-05 DIAGNOSIS — M545 Low back pain: Secondary | ICD-10-CM | POA: Diagnosis not present

## 2018-02-09 DIAGNOSIS — R531 Weakness: Secondary | ICD-10-CM | POA: Diagnosis not present

## 2018-02-09 DIAGNOSIS — R262 Difficulty in walking, not elsewhere classified: Secondary | ICD-10-CM | POA: Diagnosis not present

## 2018-02-09 DIAGNOSIS — M545 Low back pain: Secondary | ICD-10-CM | POA: Diagnosis not present

## 2018-02-09 DIAGNOSIS — M169 Osteoarthritis of hip, unspecified: Secondary | ICD-10-CM | POA: Diagnosis not present

## 2018-02-11 DIAGNOSIS — M545 Low back pain: Secondary | ICD-10-CM | POA: Diagnosis not present

## 2018-02-11 DIAGNOSIS — R262 Difficulty in walking, not elsewhere classified: Secondary | ICD-10-CM | POA: Diagnosis not present

## 2018-02-11 DIAGNOSIS — M169 Osteoarthritis of hip, unspecified: Secondary | ICD-10-CM | POA: Diagnosis not present

## 2018-02-11 DIAGNOSIS — R531 Weakness: Secondary | ICD-10-CM | POA: Diagnosis not present

## 2018-02-16 DIAGNOSIS — R262 Difficulty in walking, not elsewhere classified: Secondary | ICD-10-CM | POA: Diagnosis not present

## 2018-02-16 DIAGNOSIS — M545 Low back pain: Secondary | ICD-10-CM | POA: Diagnosis not present

## 2018-02-16 DIAGNOSIS — M169 Osteoarthritis of hip, unspecified: Secondary | ICD-10-CM | POA: Diagnosis not present

## 2018-02-16 DIAGNOSIS — R531 Weakness: Secondary | ICD-10-CM | POA: Diagnosis not present

## 2018-02-18 DIAGNOSIS — R531 Weakness: Secondary | ICD-10-CM | POA: Diagnosis not present

## 2018-02-18 DIAGNOSIS — R262 Difficulty in walking, not elsewhere classified: Secondary | ICD-10-CM | POA: Diagnosis not present

## 2018-02-18 DIAGNOSIS — M169 Osteoarthritis of hip, unspecified: Secondary | ICD-10-CM | POA: Diagnosis not present

## 2018-02-18 DIAGNOSIS — M545 Low back pain: Secondary | ICD-10-CM | POA: Diagnosis not present

## 2018-02-22 DIAGNOSIS — E785 Hyperlipidemia, unspecified: Secondary | ICD-10-CM | POA: Diagnosis not present

## 2018-02-22 DIAGNOSIS — R0609 Other forms of dyspnea: Secondary | ICD-10-CM | POA: Diagnosis not present

## 2018-02-23 DIAGNOSIS — M169 Osteoarthritis of hip, unspecified: Secondary | ICD-10-CM | POA: Diagnosis not present

## 2018-02-23 DIAGNOSIS — M545 Low back pain: Secondary | ICD-10-CM | POA: Diagnosis not present

## 2018-02-23 DIAGNOSIS — R531 Weakness: Secondary | ICD-10-CM | POA: Diagnosis not present

## 2018-02-23 DIAGNOSIS — R262 Difficulty in walking, not elsewhere classified: Secondary | ICD-10-CM | POA: Diagnosis not present

## 2018-03-09 DIAGNOSIS — L4050 Arthropathic psoriasis, unspecified: Secondary | ICD-10-CM | POA: Diagnosis not present

## 2018-03-10 DIAGNOSIS — M169 Osteoarthritis of hip, unspecified: Secondary | ICD-10-CM | POA: Diagnosis not present

## 2018-03-10 DIAGNOSIS — R262 Difficulty in walking, not elsewhere classified: Secondary | ICD-10-CM | POA: Diagnosis not present

## 2018-03-10 DIAGNOSIS — M545 Low back pain: Secondary | ICD-10-CM | POA: Diagnosis not present

## 2018-03-10 DIAGNOSIS — R531 Weakness: Secondary | ICD-10-CM | POA: Diagnosis not present

## 2018-03-12 DIAGNOSIS — Z86718 Personal history of other venous thrombosis and embolism: Secondary | ICD-10-CM | POA: Diagnosis not present

## 2018-03-12 DIAGNOSIS — I10 Essential (primary) hypertension: Secondary | ICD-10-CM | POA: Diagnosis not present

## 2018-03-12 DIAGNOSIS — M169 Osteoarthritis of hip, unspecified: Secondary | ICD-10-CM | POA: Diagnosis not present

## 2018-03-12 DIAGNOSIS — M545 Low back pain: Secondary | ICD-10-CM | POA: Diagnosis not present

## 2018-03-12 DIAGNOSIS — R0609 Other forms of dyspnea: Secondary | ICD-10-CM | POA: Diagnosis not present

## 2018-03-12 DIAGNOSIS — R531 Weakness: Secondary | ICD-10-CM | POA: Diagnosis not present

## 2018-03-12 DIAGNOSIS — Z86711 Personal history of pulmonary embolism: Secondary | ICD-10-CM | POA: Diagnosis not present

## 2018-03-12 DIAGNOSIS — R262 Difficulty in walking, not elsewhere classified: Secondary | ICD-10-CM | POA: Diagnosis not present

## 2018-03-26 DIAGNOSIS — R531 Weakness: Secondary | ICD-10-CM | POA: Diagnosis not present

## 2018-03-26 DIAGNOSIS — M545 Low back pain: Secondary | ICD-10-CM | POA: Diagnosis not present

## 2018-03-26 DIAGNOSIS — R262 Difficulty in walking, not elsewhere classified: Secondary | ICD-10-CM | POA: Diagnosis not present

## 2018-03-26 DIAGNOSIS — M169 Osteoarthritis of hip, unspecified: Secondary | ICD-10-CM | POA: Diagnosis not present

## 2018-03-31 DIAGNOSIS — R262 Difficulty in walking, not elsewhere classified: Secondary | ICD-10-CM | POA: Diagnosis not present

## 2018-03-31 DIAGNOSIS — M545 Low back pain: Secondary | ICD-10-CM | POA: Diagnosis not present

## 2018-03-31 DIAGNOSIS — M169 Osteoarthritis of hip, unspecified: Secondary | ICD-10-CM | POA: Diagnosis not present

## 2018-03-31 DIAGNOSIS — R531 Weakness: Secondary | ICD-10-CM | POA: Diagnosis not present

## 2018-04-01 DIAGNOSIS — F5221 Male erectile disorder: Secondary | ICD-10-CM | POA: Diagnosis not present

## 2018-04-01 DIAGNOSIS — N529 Male erectile dysfunction, unspecified: Secondary | ICD-10-CM | POA: Diagnosis not present

## 2018-04-01 DIAGNOSIS — M5489 Other dorsalgia: Secondary | ICD-10-CM | POA: Diagnosis not present

## 2018-04-01 DIAGNOSIS — E1129 Type 2 diabetes mellitus with other diabetic kidney complication: Secondary | ICD-10-CM | POA: Diagnosis not present

## 2018-04-01 DIAGNOSIS — F3289 Other specified depressive episodes: Secondary | ICD-10-CM | POA: Diagnosis not present

## 2018-04-01 DIAGNOSIS — E7849 Other hyperlipidemia: Secondary | ICD-10-CM | POA: Diagnosis not present

## 2018-04-01 DIAGNOSIS — Z6835 Body mass index (BMI) 35.0-35.9, adult: Secondary | ICD-10-CM | POA: Diagnosis not present

## 2018-04-01 DIAGNOSIS — E1140 Type 2 diabetes mellitus with diabetic neuropathy, unspecified: Secondary | ICD-10-CM | POA: Diagnosis not present

## 2018-04-01 DIAGNOSIS — G4733 Obstructive sleep apnea (adult) (pediatric): Secondary | ICD-10-CM | POA: Diagnosis not present

## 2018-04-07 DIAGNOSIS — R531 Weakness: Secondary | ICD-10-CM | POA: Diagnosis not present

## 2018-04-07 DIAGNOSIS — R262 Difficulty in walking, not elsewhere classified: Secondary | ICD-10-CM | POA: Diagnosis not present

## 2018-04-07 DIAGNOSIS — M169 Osteoarthritis of hip, unspecified: Secondary | ICD-10-CM | POA: Diagnosis not present

## 2018-04-07 DIAGNOSIS — M545 Low back pain: Secondary | ICD-10-CM | POA: Diagnosis not present

## 2018-04-14 DIAGNOSIS — Z79899 Other long term (current) drug therapy: Secondary | ICD-10-CM | POA: Diagnosis not present

## 2018-04-14 DIAGNOSIS — M545 Low back pain: Secondary | ICD-10-CM | POA: Diagnosis not present

## 2018-04-14 DIAGNOSIS — E669 Obesity, unspecified: Secondary | ICD-10-CM | POA: Diagnosis not present

## 2018-04-14 DIAGNOSIS — L4050 Arthropathic psoriasis, unspecified: Secondary | ICD-10-CM | POA: Diagnosis not present

## 2018-04-14 DIAGNOSIS — M2550 Pain in unspecified joint: Secondary | ICD-10-CM | POA: Diagnosis not present

## 2018-04-14 DIAGNOSIS — Z6833 Body mass index (BMI) 33.0-33.9, adult: Secondary | ICD-10-CM | POA: Diagnosis not present

## 2018-04-14 DIAGNOSIS — L409 Psoriasis, unspecified: Secondary | ICD-10-CM | POA: Diagnosis not present

## 2018-04-29 DIAGNOSIS — R262 Difficulty in walking, not elsewhere classified: Secondary | ICD-10-CM | POA: Diagnosis not present

## 2018-04-29 DIAGNOSIS — M169 Osteoarthritis of hip, unspecified: Secondary | ICD-10-CM | POA: Diagnosis not present

## 2018-04-29 DIAGNOSIS — R531 Weakness: Secondary | ICD-10-CM | POA: Diagnosis not present

## 2018-04-29 DIAGNOSIS — M545 Low back pain: Secondary | ICD-10-CM | POA: Diagnosis not present

## 2018-05-06 ENCOUNTER — Ambulatory Visit: Payer: Medicare Other | Admitting: Pulmonary Disease

## 2018-05-06 ENCOUNTER — Ambulatory Visit: Payer: Medicare Other | Admitting: Nurse Practitioner

## 2018-05-06 DIAGNOSIS — L4050 Arthropathic psoriasis, unspecified: Secondary | ICD-10-CM | POA: Diagnosis not present

## 2018-05-10 ENCOUNTER — Ambulatory Visit (INDEPENDENT_AMBULATORY_CARE_PROVIDER_SITE_OTHER): Payer: Medicare Other | Admitting: Pulmonary Disease

## 2018-05-10 ENCOUNTER — Encounter: Payer: Self-pay | Admitting: Pulmonary Disease

## 2018-05-10 VITALS — BP 124/72 | HR 89 | Ht 72.0 in | Wt 253.0 lb

## 2018-05-10 DIAGNOSIS — G4733 Obstructive sleep apnea (adult) (pediatric): Secondary | ICD-10-CM | POA: Diagnosis not present

## 2018-05-10 NOTE — Patient Instructions (Signed)
Severe obstructive sleep apnea  Very compliant with CPAP use  Significant weight loss recently  Not tolerating variable setting   We will set your CPAP to 10  DME referral for CPAP supplies  We will obtain a download in 4 weeks and see you back in the office in 4 weeks  Call if any significant symptoms

## 2018-05-10 NOTE — Progress Notes (Signed)
Anthony Case    [id number]    1951/05/31  Primary Care Physician:Paterson, Quillian Quince, MD  Referring Physician: Leanna Battles, [place], [place] [address]  Chief complaint:   History of severe obstructive sleep apnea Compliant with CPAP  HPI:  Diagnosed with obstructive sleep apnea 94 Has been on CPAP, pressure setting of 10 Had a repeat home sleep study done in 2014, severe obstructive sleep apnea He did receive a CPAP machine, auto titrating CPAP set at 5-20 He was not using the machine up until recently when his previous machine became defective He is not tolerant of the new setting of 5-20 He was able to get his old machine fixed-needed a new plug, He feels this works better for him  He gets about 6 to 8 hours of sleep every night, goes to bed about 11-12 Functions well during the day Recently changed his diet, stopped drinking completely-has been able to lose up to 50 pounds in 2 months-he is more active, exercises regularly  Occupation: No pertinent history Exposures: No pertinent history Smoking history: Never smoker  Outpatient Encounter Medications as of 05/10/2018  Medication Sig  . ACETAMINOPHEN PO Take by mouth.  . Dapagliflozin Propanediol (FARXIGA) 10 MG TABS Take 10 mg by mouth daily.  Marland Kitchen diltiazem (CARDIZEM) 60 MG tablet Take 60 mg by mouth daily.   Marland Kitchen glipiZIDE (GLUCOTROL XL) 5 MG 24 hr tablet   . insulin detemir (LEVEMIR) 100 UNIT/ML injection Inject 40 Units into the skin at bedtime.   . lovastatin (MEVACOR) 20 MG tablet Take 20 mg by mouth every morning.  . metFORMIN (GLUCOPHAGE) 500 MG tablet Take 1,000 mg by mouth daily with breakfast.  . Rivaroxaban (XARELTO) 20 MG TABS tablet Take 20 mg by mouth daily. 08/04/13 last dose of xarelto in preparation for colonoscopy   No facility-administered encounter medications on file as of 05/10/2018.     Allergies as of 05/10/2018 - Review Complete 05/10/2018  Allergen Reaction  Noted  . Lisinopril Cough 04/07/2012    Past Medical History:  Diagnosis Date  . Arthritis   . Diabetes mellitus   . Hx pulmonary embolism 2009   required emergent tPA treatment  . Hypertension   . Shortness of breath   . Sleep apnea     Past Surgical History:  Procedure Laterality Date  . BACK SURGERY    . COLONOSCOPY  2009   adenoma polyp  . ELBOW ARTHROSCOPY  2010  . LUMBAR PERCUTANEOUS PEDICLE SCREW 3 LEVEL N/A 05/25/2013   Procedure: Posterior Percutaneous Fixation from Thoracic nine to Lumbar one vertebrae with arthrodesis of Thoracic eleven Fracture;  Surgeon: Kristeen Miss, MD;  Location: [facility];  Service: Neurosurgery;  Laterality: N/A;  Posterior Percutaneous Fixation from Thoracic nine to Lumbar one vertebrae with arthrodesis of Thoracic eleven Fracture    Family History  Problem Relation Age of Onset  . Heart disease Brother   . Heart disease Father     Social History   Socioeconomic History  . Marital status: Married    Spouse name: Not on file  . Number of children: 3  . Years of education: Not on file  . Highest education level: Not on file  Occupational History  . Occupation: Surveyor, mining: [employer]  Social Needs  . Financial resource strain: Not on file  . Food insecurity:    Worry: Not on file    Inability: Not on file  .  Transportation needs:    Medical: Not on file    Non-medical: Not on file  Tobacco Use  . Smoking status: Never Smoker  . Smokeless tobacco: Never Used  Substance and Sexual Activity  . Alcohol use: Yes    Comment: occasionally  . Drug use: No  . Sexual activity: Not on file  Lifestyle  . Physical activity:    Days per week: Not on file    Minutes per session: Not on file  . Stress: Not on file  Relationships  . Social connections:    Talks on phone: Not on file    Gets together: Not on file    Attends religious service: Not on file    Active member of club or organization: Not on file    Attends meetings  of clubs or organizations: Not on file    Relationship status: Not on file  . Intimate partner violence:    Fear of current or ex partner: Not on file    Emotionally abused: Not on file    Physically abused: Not on file    Forced sexual activity: Not on file  Other Topics Concern  . Not on file  Social History Narrative  . Not on file    Review of Systems  Constitutional: Negative for fatigue.  Eyes: Negative.   Respiratory: Positive for apnea.   Cardiovascular: Negative.   Gastrointestinal: Negative.   Psychiatric/Behavioral: Positive for sleep disturbance.    Vitals:   05/10/18 0921  BP: 124/72  Pulse: 89  SpO2: 95%     Physical Exam  Constitutional: He appears well-developed.  HENT:  Head: Normocephalic and atraumatic.  Crowded oropharynx, Mallampati 3  Eyes: Pupils are equal, round, and reactive to light. Conjunctivae and EOM are normal. Right eye exhibits no discharge. Left eye exhibits no discharge.  Neck: Normal range of motion. Neck supple. No thyromegaly present.  Cardiovascular: Normal rate and regular rhythm. Exam reveals no friction rub.  Murmur heard. Pulmonary/Chest: Effort normal and breath sounds normal. No respiratory distress. He has no wheezes.  Abdominal: Soft. Bowel sounds are normal. He exhibits no distension.   Data Reviewed: Most recent study was performed 08/16/2013, showed severe obstructive sleep apnea, tachyarrhythmia  Assessment:  Severe obstructive sleep apnea-compliant with treatment  Significant weight loss recently-he feels this is sustainable  Plan/Recommendations:  DME referral  CPAP supplies  We will have his machine set at 10 cm of water  We will obtain a download in about 4 weeks  I will see him in the office in about 4 weeks  If treatment is not optimal at that time then he may require a repeat polysomnogram and titration  As he is losing a lot of weight recently as well, his pressure requirement may change      Sherrilyn Rist MD [facility] 05/10/2018, 9:45 AM  CC: Leanna Battles, MD

## 2018-06-22 ENCOUNTER — Encounter: Payer: Self-pay | Admitting: Pulmonary Disease

## 2018-06-22 ENCOUNTER — Ambulatory Visit (INDEPENDENT_AMBULATORY_CARE_PROVIDER_SITE_OTHER): Payer: Medicare Other | Admitting: Pulmonary Disease

## 2018-06-22 VITALS — BP 130/72 | HR 72 | Ht 71.0 in | Wt 239.0 lb

## 2018-06-22 DIAGNOSIS — G4733 Obstructive sleep apnea (adult) (pediatric): Secondary | ICD-10-CM | POA: Diagnosis not present

## 2018-06-22 DIAGNOSIS — Z23 Encounter for immunization: Secondary | ICD-10-CM | POA: Diagnosis not present

## 2018-06-22 NOTE — Patient Instructions (Signed)
Obstructive sleep apnea adequately treated with CPAP at present  Some mask issues-we will schedule a mask fit  Doing very well with treatment, functioning well I will see you back in the office in 6 months  High-dose flu shot today  Call with any significant concerns

## 2018-06-22 NOTE — Progress Notes (Signed)
MAAHIR HORST    [id number]    May 02, 1951  Primary Care Physician:Paterson, Quillian Quince, MD  Referring Physician: Leanna Battles, [address] [place], [place] [address]  Chief complaint:   History of severe obstructive sleep apnea Compliant with CPAP-currently on CPAP of 10  HPI:  Currently on CPAP of 10, tolerating treatment well Compliance data shows 100% compliance with residual AHI of 1.1 Diagnosed with obstructive sleep [phone]  Has been on CPAP, pressure setting of 10 Had a repeat home sleep study done in 2014, severe obstructive sleep apnea He did receive a CPAP machine, auto titrating CPAP set at 5-20 He was not using the machine up until recently when his previous machine became defective He is not tolerant of the new setting of 5-20 He was able to get his old machine fixed-needed a new plug, He feels this works better for him  He gets about 6 to 8 hours of sleep every night, goes to bed about 11-12 Functions well during the day Recently changed his diet, stopped drinking completely-has been able to lose up to 50 pounds in 2 months Continues to stay very active, he is at the [facility] at least 4 days a week  Occupation: No pertinent history Exposures: No pertinent history Smoking history: Never smoker  Outpatient Encounter Medications as of 06/22/2018  Medication Sig  . ACETAMINOPHEN PO Take by mouth.  . Dapagliflozin Propanediol (FARXIGA) 10 MG TABS Take 10 mg by mouth daily.  Marland Kitchen diltiazem (CARDIZEM) 60 MG tablet Take 60 mg by mouth daily.   Marland Kitchen glipiZIDE (GLUCOTROL XL) 5 MG 24 hr tablet   . insulin detemir (LEVEMIR) 100 UNIT/ML injection Inject 40 Units into the skin at bedtime.   . lovastatin (MEVACOR) 20 MG tablet Take 20 mg by mouth every morning.  . metFORMIN (GLUCOPHAGE) 500 MG tablet Take 1,000 mg by mouth daily with breakfast.  . Rivaroxaban (XARELTO) 20 MG TABS tablet Take 20 mg by mouth daily. 08/04/13 last dose of xarelto in preparation  for colonoscopy   No facility-administered encounter medications on file as of 06/22/2018.     Allergies as of 06/22/2018 - Review Complete 06/22/2018  Allergen Reaction Noted  . Lisinopril Cough 04/07/2012    Past Medical History:  Diagnosis Date  . Arthritis   . Diabetes mellitus   . Hx pulmonary embolism 2009   required emergent tPA treatment  . Hypertension   . Shortness of breath   . Sleep apnea     Past Surgical History:  Procedure Laterality Date  . BACK SURGERY    . COLONOSCOPY  2009   adenoma polyp  . ELBOW ARTHROSCOPY  2010  . LUMBAR PERCUTANEOUS PEDICLE SCREW 3 LEVEL N/A 05/25/2013   Procedure: Posterior Percutaneous Fixation from Thoracic nine to Lumbar one vertebrae with arthrodesis of Thoracic eleven Fracture;  Surgeon: Kristeen Miss, MD;  Location: [facility];  Service: Neurosurgery;  Laterality: N/A;  Posterior Percutaneous Fixation from Thoracic nine to Lumbar one vertebrae with arthrodesis of Thoracic eleven Fracture    Family History  Problem Relation Age of Onset  . Heart disease Brother   . Heart disease Father     Social History   Socioeconomic History  . Marital status: Married    Spouse name: Not on file  . Number of children: 3  . Years of education: Not on file  . Highest education level: Not on file  Occupational History  . Occupation: Optometrist  Employer: [employer]  Social Needs  . Financial resource strain: Not on file  . Food insecurity:    Worry: Not on file    Inability: Not on file  . Transportation needs:    Medical: Not on file    Non-medical: Not on file  Tobacco Use  . Smoking status: Never Smoker  . Smokeless tobacco: Never Used  Substance and Sexual Activity  . Alcohol use: Yes    Comment: occasionally  . Drug use: No  . Sexual activity: Not on file  Lifestyle  . Physical activity:    Days per week: Not on file    Minutes per session: Not on file  . Stress: Not on file  Relationships  . Social connections:     Talks on phone: Not on file    Gets together: Not on file    Attends religious service: Not on file    Active member of club or organization: Not on file    Attends meetings of clubs or organizations: Not on file    Relationship status: Not on file  . Intimate partner violence:    Fear of current or ex partner: Not on file    Emotionally abused: Not on file    Physically abused: Not on file    Forced sexual activity: Not on file  Other Topics Concern  . Not on file  Social History Narrative  . Not on file    Review of Systems  Constitutional: Negative for fatigue.       Fatigue is improved  HENT: Negative.   Eyes: Negative.   Respiratory: Positive for apnea. Negative for shortness of breath and stridor.   Cardiovascular: Negative.   Gastrointestinal: Negative.   Endocrine: Negative.   Genitourinary: Negative.   Psychiatric/Behavioral: Positive for sleep disturbance.    Vitals:   06/22/18 0914  BP: 130/72  Pulse: 72  SpO2: 100%     Physical Exam  Constitutional: He appears well-developed and well-nourished. No distress.  HENT:  Head: Normocephalic and atraumatic.  Mouth/Throat: No oropharyngeal exudate.  Crowded oropharynx, Mallampati 3  Eyes: Pupils are equal, round, and reactive to light. Conjunctivae and EOM are normal. Right eye exhibits no discharge. Left eye exhibits no discharge.  Neck: Normal range of motion. Neck supple. No tracheal deviation present. No thyromegaly present.  Cardiovascular: Normal rate and regular rhythm. Exam reveals no friction rub.  Murmur heard. Pulmonary/Chest: Effort normal and breath sounds normal. No respiratory distress. He has no wheezes.   Data Reviewed: Most recent study was performed 08/16/2013, showed severe obstructive sleep apnea, tachyarrhythmia Current compliance data shows almost 8 hours of use nightly, CPAP is set at 10, residual AHI of 1.1 Assessment:  Severe obstructive sleep apnea-compliant with  treatment -Adequately treated at present -Tolerating treatment very well  Significant weight loss recently-he feels this is sustainable -Continues to do well with his weight loss efforts -Still losing weight  Plan/Recommendations:  DME referral-for mask fit  Continue CPAP at 10  We will see him back in the office in 6 months-encouraged to call if any significant concerns  As he is losing a lot of weight recently as well, his pressure requirement may change -tolerating pressure is very well at present and continues to function well  Encouraged to call with any significant concerns  Will provide a high-dose flu shot today    Sherrilyn Rist MD  [facility] 06/22/2018, 9:31 AM  CC: Leanna Battles, MD

## 2018-06-23 DIAGNOSIS — Z86718 Personal history of other venous thrombosis and embolism: Secondary | ICD-10-CM | POA: Diagnosis not present

## 2018-06-23 DIAGNOSIS — I10 Essential (primary) hypertension: Secondary | ICD-10-CM | POA: Diagnosis not present

## 2018-06-23 DIAGNOSIS — Q231 Congenital insufficiency of aortic valve: Secondary | ICD-10-CM | POA: Diagnosis not present

## 2018-06-23 DIAGNOSIS — R0609 Other forms of dyspnea: Secondary | ICD-10-CM | POA: Diagnosis not present

## 2018-07-01 DIAGNOSIS — L4050 Arthropathic psoriasis, unspecified: Secondary | ICD-10-CM | POA: Diagnosis not present

## 2018-07-01 DIAGNOSIS — Z79899 Other long term (current) drug therapy: Secondary | ICD-10-CM | POA: Diagnosis not present

## 2018-07-15 DIAGNOSIS — Z79899 Other long term (current) drug therapy: Secondary | ICD-10-CM | POA: Diagnosis not present

## 2018-07-15 DIAGNOSIS — L409 Psoriasis, unspecified: Secondary | ICD-10-CM | POA: Diagnosis not present

## 2018-07-15 DIAGNOSIS — L4050 Arthropathic psoriasis, unspecified: Secondary | ICD-10-CM | POA: Diagnosis not present

## 2018-07-15 DIAGNOSIS — M2550 Pain in unspecified joint: Secondary | ICD-10-CM | POA: Diagnosis not present

## 2018-07-15 DIAGNOSIS — Z6831 Body mass index (BMI) 31.0-31.9, adult: Secondary | ICD-10-CM | POA: Diagnosis not present

## 2018-07-15 DIAGNOSIS — E669 Obesity, unspecified: Secondary | ICD-10-CM | POA: Diagnosis not present

## 2018-07-15 DIAGNOSIS — M545 Low back pain: Secondary | ICD-10-CM | POA: Diagnosis not present

## 2018-08-26 DIAGNOSIS — M25511 Pain in right shoulder: Secondary | ICD-10-CM | POA: Diagnosis not present

## 2018-08-26 DIAGNOSIS — E1140 Type 2 diabetes mellitus with diabetic neuropathy, unspecified: Secondary | ICD-10-CM | POA: Diagnosis not present

## 2018-08-26 DIAGNOSIS — M25512 Pain in left shoulder: Secondary | ICD-10-CM | POA: Diagnosis not present

## 2018-08-26 DIAGNOSIS — Z7901 Long term (current) use of anticoagulants: Secondary | ICD-10-CM | POA: Diagnosis not present

## 2018-08-26 DIAGNOSIS — L4050 Arthropathic psoriasis, unspecified: Secondary | ICD-10-CM | POA: Diagnosis not present

## 2018-08-26 DIAGNOSIS — R2681 Unsteadiness on feet: Secondary | ICD-10-CM | POA: Diagnosis not present

## 2018-08-26 DIAGNOSIS — G4733 Obstructive sleep apnea (adult) (pediatric): Secondary | ICD-10-CM | POA: Diagnosis not present

## 2018-08-26 DIAGNOSIS — Z6833 Body mass index (BMI) 33.0-33.9, adult: Secondary | ICD-10-CM | POA: Diagnosis not present

## 2018-08-31 DIAGNOSIS — L4050 Arthropathic psoriasis, unspecified: Secondary | ICD-10-CM | POA: Diagnosis not present

## 2018-09-16 ENCOUNTER — Encounter: Payer: Self-pay | Admitting: Neurology

## 2018-09-22 ENCOUNTER — Ambulatory Visit (INDEPENDENT_AMBULATORY_CARE_PROVIDER_SITE_OTHER): Payer: Medicare Other

## 2018-09-22 ENCOUNTER — Ambulatory Visit (INDEPENDENT_AMBULATORY_CARE_PROVIDER_SITE_OTHER): Payer: Self-pay

## 2018-09-22 ENCOUNTER — Encounter (INDEPENDENT_AMBULATORY_CARE_PROVIDER_SITE_OTHER): Payer: Self-pay | Admitting: Orthopedic Surgery

## 2018-09-22 ENCOUNTER — Ambulatory Visit (INDEPENDENT_AMBULATORY_CARE_PROVIDER_SITE_OTHER): Payer: Medicare Other | Admitting: Orthopedic Surgery

## 2018-09-22 DIAGNOSIS — E11618 Type 2 diabetes mellitus with other diabetic arthropathy: Secondary | ICD-10-CM | POA: Diagnosis not present

## 2018-09-22 DIAGNOSIS — M25511 Pain in right shoulder: Secondary | ICD-10-CM | POA: Diagnosis not present

## 2018-09-22 DIAGNOSIS — M6702 Short Achilles tendon (acquired), left ankle: Secondary | ICD-10-CM | POA: Diagnosis not present

## 2018-09-22 DIAGNOSIS — M25512 Pain in left shoulder: Secondary | ICD-10-CM | POA: Diagnosis not present

## 2018-09-22 DIAGNOSIS — M79672 Pain in left foot: Secondary | ICD-10-CM

## 2018-09-22 DIAGNOSIS — E1142 Type 2 diabetes mellitus with diabetic polyneuropathy: Secondary | ICD-10-CM

## 2018-09-22 DIAGNOSIS — M7500 Adhesive capsulitis of unspecified shoulder: Secondary | ICD-10-CM

## 2018-09-22 NOTE — Progress Notes (Signed)
Office Visit Note   Patient: Anthony Case           Date of Birth: [birth year]           MRN: [id number] Visit Date: 09/22/2018              Requested by: Leanna Battles, MD [place], [place] [address] PCP: Leanna Battles, MD  No chief complaint on file.     HPI: Patient is a 68 year old gentleman with type 2 diabetes with recent weight loss due to exercise and change in diet.  Patient states that his hemoglobin A1c has gone from 7.3 down to a normal range.  Patient also states that he has psoriatic arthritis and is on Remicade.  Patient complains of pain over the fifth metatarsal head of the left foot and bilateral shoulder pain with decreasing range of motion of the left shoulder.  Patient complains of pain anteriorly in the proximal arm with increased pain at night and decreasing range of motion heat helps improve the range of motion of his shoulder.  Assessment & Plan: Visit Diagnoses:  1. Pain in left foot   2. Bilateral shoulder pain, unspecified chronicity   3. Diabetic polyneuropathy associated with type 2 diabetes mellitus ([place])   4. 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 Achilles tendon contracture, left     Plan: Patient will continue to work with his shoulders for scapular stabilization exercises as well as range of motion exercises for both shoulders.  Patient was given instructions and demonstrated Achilles stretching to do 5 times a day a minute at a time.  Recommended Hoka sneakers and sole orthotics.  Follow-Up Instructions: Return in about 4 weeks (around 10/20/2018).   Ortho Exam  Patient is alert, oriented, no adenopathy, well-dressed, normal affect, normal respiratory effort. Examination patient has a palpable dorsalis pedis pulse on the left.  He has significant Achilles contracture with dorsiflexion 20 degrees short of neutral with his knee extended.  Patient has a callus beneath the fifth metatarsal  head and this was pared with no open ulcer.  Residual area 10 mm.  Examination of both shoulders patient has glenohumeral abduction and flexion of the left shoulder to 70 degrees he has external rotation of 20 degrees internal rotation to 20 degrees with adhesive capsulitis of the left shoulder.  Patient has internal and external rotation about 45 degrees of the right shoulder with glenohumeral motion to 90 degrees with less restrictions with the right shoulder than the left.  AC joint is nontender to palpation bilaterally biceps tendon is nontender to palpation bilaterally  Imaging: Xr Foot Complete Left  Result Date: 09/22/2018 2 view radiographs of the left foot shows degenerative arthritic changes to the midfoot large calcified bone spur at the insertion of the Achilles and significant peripheral vascular disease with calcification of the vessels out to the toes.  Xr Shoulder Left  Result Date: 09/22/2018 2 view radiographs of the left shoulder shows less arthritis than the right shoulder with retained instrumentation seen on both shoulder radiographs from thoracic lumbar spine fusion.  Xr Shoulder Right  Result Date: 09/22/2018 2 view radiographs of the right shoulder shows glenohumeral arthritis with arthritis of the [facility] joint.  No images are attached to the encounter.  Labs: Lab Results  Component Value Date   HGBA1C (H) 05/18/2009    8.2 (NOTE) The ADA recommends the following therapeutic goal for glycemic control related to Hgb A1c measurement: Goal  of therapy: <6.5 Hgb A1c  Reference: American Diabetes Association: Clinical Practice Recommendations 2010, Diabetes Care, 2010, 33: (Suppl  1).     Lab Results  Component Value Date   ALBUMIN 3.8 04/04/2015   ALBUMIN 3.8 05/17/2009    There is no height or weight on file to calculate BMI.  Orders:  Orders Placed This Encounter  Procedures  . XR Foot Complete Left  . XR Shoulder Right  . XR Shoulder Left   No orders of  the defined types were placed in this encounter.    Procedures: Large Joint Inj: bilateral subacromial bursa on 09/22/2018 3:19 PM Indications: diagnostic evaluation and pain Details: 22 G 1.5 in needle, posterior approach  Arthrogram: No  Outcome: tolerated well, no immediate complications Procedure, treatment alternatives, risks and benefits explained, specific risks discussed. Consent was given by the patient. Immediately prior to procedure a time out was called to verify the correct patient, procedure, equipment, support staff and site/side marked as required. Patient was prepped and draped in the usual sterile fashion.      Clinical Data: No additional findings.  ROS:  All other systems negative, except as noted in the HPI. Review of Systems  Objective: Vital Signs: There were no vitals taken for this visit.  Specialty Comments:  No specialty comments available.  PMFS History: Patient Active Problem List   Diagnosis Date Noted  . History of adenomatous polyp of colon 08/05/2013  . Thoracic spine fracture ([place]) 05/23/2013  . Pulmonary embolism ([place]) 04/09/2012    Class: Acute  . Left leg DVT ([place]) 04/09/2012    Class: Acute  . Dyspnea on exertion 04/07/2012    Class: Acute  . Leg edema, left 04/07/2012    Class: Acute  . DIABETES, TYPE 2 04/12/2008  . HYPERLIPIDEMIA 04/12/2008  . HYPERTENSION 04/12/2008  . OSA (obstructive sleep apnea) 04/12/2008   Past Medical History:  Diagnosis Date  . Arthritis   . Diabetes mellitus   . Hx pulmonary embolism 2009   required emergent tPA treatment  . Hypertension   . Shortness of breath   . Sleep apnea     Family History  Problem Relation Age of Onset  . Heart disease Brother   . Heart disease Father     Past Surgical History:  Procedure Laterality Date  . BACK SURGERY    . COLONOSCOPY  2009   adenoma polyp  . ELBOW ARTHROSCOPY  2010  . LUMBAR PERCUTANEOUS PEDICLE SCREW 3 LEVEL N/A 05/25/2013   Procedure:  Posterior Percutaneous Fixation from Thoracic nine to Lumbar one vertebrae with arthrodesis of Thoracic eleven Fracture;  Surgeon: Kristeen Miss, MD;  Location: [facility];  Service: Neurosurgery;  Laterality: N/A;  Posterior Percutaneous Fixation from Thoracic nine to Lumbar one vertebrae with arthrodesis of Thoracic eleven Fracture   Social History   Occupational History  . Occupation: Surveyor, mining: [employer]  Tobacco Use  . Smoking status: Never Smoker  . Smokeless tobacco: Never Used  Substance and Sexual Activity  . Alcohol use: Yes    Comment: occasionally  . Drug use: No  . Sexual activity: Not on file

## 2018-10-14 ENCOUNTER — Encounter: Payer: Self-pay | Admitting: Gastroenterology

## 2018-10-19 DIAGNOSIS — M2550 Pain in unspecified joint: Secondary | ICD-10-CM | POA: Diagnosis not present

## 2018-10-19 DIAGNOSIS — M545 Low back pain: Secondary | ICD-10-CM | POA: Diagnosis not present

## 2018-10-19 DIAGNOSIS — Z79899 Other long term (current) drug therapy: Secondary | ICD-10-CM | POA: Diagnosis not present

## 2018-10-19 DIAGNOSIS — L409 Psoriasis, unspecified: Secondary | ICD-10-CM | POA: Diagnosis not present

## 2018-10-19 DIAGNOSIS — E669 Obesity, unspecified: Secondary | ICD-10-CM | POA: Diagnosis not present

## 2018-10-19 DIAGNOSIS — Z6833 Body mass index (BMI) 33.0-33.9, adult: Secondary | ICD-10-CM | POA: Diagnosis not present

## 2018-10-19 DIAGNOSIS — L4050 Arthropathic psoriasis, unspecified: Secondary | ICD-10-CM | POA: Diagnosis not present

## 2018-10-25 ENCOUNTER — Ambulatory Visit (INDEPENDENT_AMBULATORY_CARE_PROVIDER_SITE_OTHER): Payer: Medicare Other | Admitting: Orthopedic Surgery

## 2018-10-25 ENCOUNTER — Encounter (INDEPENDENT_AMBULATORY_CARE_PROVIDER_SITE_OTHER): Payer: Self-pay | Admitting: Orthopedic Surgery

## 2018-10-25 VITALS — Ht 71.0 in | Wt 239.0 lb

## 2018-10-25 DIAGNOSIS — M25512 Pain in left shoulder: Secondary | ICD-10-CM | POA: Diagnosis not present

## 2018-10-25 DIAGNOSIS — M25511 Pain in right shoulder: Secondary | ICD-10-CM

## 2018-10-25 NOTE — Progress Notes (Signed)
Office Visit Note   Patient: Anthony Case           Date of Birth: 11/22/1950           MRN: [id number] Visit Date: 10/25/2018              Requested by: Leanna Battles, [place], [place] [address] PCP: Leanna Battles, MD  Chief Complaint  Patient presents with  . Right Shoulder - Follow-up  . Left Shoulder - Follow-up      HPI: Patient is a 68 year old gentleman who presents in follow-up for bilateral upper extremity impingement symptoms.  He has been back to the gym is doing all exercises except for bench press and military press he states that his arms are feeling much better.  Assessment & Plan: Visit Diagnoses:  1. Bilateral shoulder pain, unspecified chronicity     Plan: Patient will continue with his strengthening he was given instructions for scapular stabilization exercises such as TRX row.  Follow-Up Instructions: Return in about 2 months (around 12/25/2018).   Ortho Exam  Patient is alert, oriented, no adenopathy, well-dressed, normal affect, normal respiratory effort. Examination patient has full range of motion of both shoulders no pain with Neer or Hawkins impingement test.  Imaging: No results found. No images are attached to the encounter.  Labs: Lab Results  Component Value Date   HGBA1C (H) 05/18/2009    8.2 (NOTE) The ADA recommends the following therapeutic goal for glycemic control related to Hgb A1c measurement: Goal of therapy: <6.5 Hgb A1c  Reference: American Diabetes Association: Clinical Practice Recommendations 2010, Diabetes Care, 2010, 33: (Suppl  1).     Lab Results  Component Value Date   ALBUMIN 3.8 04/04/2015   ALBUMIN 3.8 05/17/2009    Body mass index is 33.33 kg/m.  Orders:  No orders of the defined types were placed in this encounter.  No orders of the defined types were placed in this encounter.    Procedures: No procedures performed  Clinical Data: No additional  findings.  ROS:  All other systems negative, except as noted in the HPI. Review of Systems  Objective: Vital Signs: Ht 5\' 11"  (1.803 m)   Wt 239 lb (108.4 kg)   BMI 33.33 kg/m   Specialty Comments:  No specialty comments available.  PMFS History: Patient Active Problem List   Diagnosis Date Noted  . History of adenomatous polyp of colon 08/05/2013  . Thoracic spine fracture ([place]) 05/23/2013  . Pulmonary embolism ([place]) 04/09/2012    Class: Acute  . Left leg DVT ([place]) 04/09/2012    Class: Acute  . Dyspnea on exertion 04/07/2012    Class: Acute  . Leg edema, left 04/07/2012    Class: Acute  . DIABETES, TYPE 2 04/12/2008  . HYPERLIPIDEMIA 04/12/2008  . HYPERTENSION 04/12/2008  . OSA (obstructive sleep apnea) 04/12/2008   Past Medical History:  Diagnosis Date  . Arthritis   . Diabetes mellitus   . Hx pulmonary embolism 2009   required emergent tPA treatment  . Hypertension   . Shortness of breath   . Sleep apnea     Family History  Problem Relation Age of Onset  . Heart disease Brother   . Heart disease Father     Past Surgical History:  Procedure Laterality Date  . BACK SURGERY    . COLONOSCOPY  2009   adenoma polyp  . ELBOW ARTHROSCOPY  2010  . LUMBAR PERCUTANEOUS PEDICLE SCREW 3 LEVEL N/A 05/25/2013  Procedure: Posterior Percutaneous Fixation from Thoracic nine to Lumbar one vertebrae with arthrodesis of Thoracic eleven Fracture;  Surgeon: Kristeen Miss, MD;  Location: [facility];  Service: Neurosurgery;  Laterality: N/A;  Posterior Percutaneous Fixation from Thoracic nine to Lumbar one vertebrae with arthrodesis of Thoracic eleven Fracture   Social History   Occupational History  . Occupation: Surveyor, mining: [employer]  Tobacco Use  . Smoking status: Never Smoker  . Smokeless tobacco: Never Used  Substance and Sexual Activity  . Alcohol use: Yes    Comment: occasionally  . Drug use: No  . Sexual activity: Not on file

## 2018-10-27 DIAGNOSIS — L4050 Arthropathic psoriasis, unspecified: Secondary | ICD-10-CM | POA: Diagnosis not present

## 2018-11-10 ENCOUNTER — Ambulatory Visit: Payer: Medicare Other | Admitting: Neurology

## 2018-11-22 ENCOUNTER — Ambulatory Visit: Payer: Medicare Other | Admitting: Neurology

## 2018-12-28 DIAGNOSIS — Z79899 Other long term (current) drug therapy: Secondary | ICD-10-CM | POA: Diagnosis not present

## 2018-12-28 DIAGNOSIS — L4050 Arthropathic psoriasis, unspecified: Secondary | ICD-10-CM | POA: Diagnosis not present

## 2018-12-29 ENCOUNTER — Ambulatory Visit: Payer: Self-pay | Admitting: Family

## 2019-01-25 DIAGNOSIS — L409 Psoriasis, unspecified: Secondary | ICD-10-CM | POA: Diagnosis not present

## 2019-01-25 DIAGNOSIS — M545 Low back pain: Secondary | ICD-10-CM | POA: Diagnosis not present

## 2019-01-25 DIAGNOSIS — M150 Primary generalized (osteo)arthritis: Secondary | ICD-10-CM | POA: Diagnosis not present

## 2019-01-25 DIAGNOSIS — Z79899 Other long term (current) drug therapy: Secondary | ICD-10-CM | POA: Diagnosis not present

## 2019-01-25 DIAGNOSIS — L4050 Arthropathic psoriasis, unspecified: Secondary | ICD-10-CM | POA: Diagnosis not present

## 2019-02-21 ENCOUNTER — Ambulatory Visit (INDEPENDENT_AMBULATORY_CARE_PROVIDER_SITE_OTHER): Payer: Medicare Other | Admitting: Neurology

## 2019-02-21 ENCOUNTER — Other Ambulatory Visit: Payer: Self-pay

## 2019-02-21 ENCOUNTER — Other Ambulatory Visit (INDEPENDENT_AMBULATORY_CARE_PROVIDER_SITE_OTHER): Payer: Medicare Other

## 2019-02-21 ENCOUNTER — Encounter: Payer: Self-pay | Admitting: Neurology

## 2019-02-21 VITALS — BP 120/60 | HR 72 | Ht 71.0 in | Wt 260.0 lb

## 2019-02-21 DIAGNOSIS — E1142 Type 2 diabetes mellitus with diabetic polyneuropathy: Secondary | ICD-10-CM

## 2019-02-21 DIAGNOSIS — E639 Nutritional deficiency, unspecified: Secondary | ICD-10-CM | POA: Diagnosis not present

## 2019-02-21 DIAGNOSIS — G621 Alcoholic polyneuropathy: Secondary | ICD-10-CM

## 2019-02-21 DIAGNOSIS — E538 Deficiency of other specified B group vitamins: Secondary | ICD-10-CM | POA: Diagnosis not present

## 2019-02-21 NOTE — Progress Notes (Signed)
Anthony Case - Initial Visit   Date: 02/21/19  JAHAAD PENADO MRN: [id number] DOB: 08-01-1951   Dear Dr. Philip Aspen:  Thank you for your kind referral of Anthony Case for consultation of numbness of the hands and feet. Although his history is well known to you, please allow [place] to reiterate it for the purpose of our medical record. The patient was accompanied to the clinic by self.    History of Present Illness: Anthony Case is a 68 y.o. 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   Starting around ~2005, he began having numbness involving the toes and soles of the feet which has steadily progressed to involve up to the level of the ankles.  Starting in the fall of 2019, he began having numbness in the hands, which is worse at night time.  He often wakes up having to shake his hands to wake them up.  He has some weakness with grip.  He was diagnosed with diabetes in ~1999.   He was drinking 3-5 drinks nightly for 20 years and has been sober since May 2019.    In 2019, he made positive lifestyle changes including diet, exercise, and alcohol cessation.  He reports loosing about 50lb.  He continues to walk regularly, but has been unable to strengthen his upper body due as he is unable to got to the gym.  His diabetes is well-controlled, last HbA1c 5.3.   Out-side paper records, electronic medical record, and images have been reviewed where available and summarized as:  Labs 02/22/2018: Sodium 143, potassium 4.2, BUN 14, creatinine 1.0, TSH 1.37,   Past Medical History:  Diagnosis Date  . Arthritis   . Diabetes mellitus   . Hx pulmonary embolism 2009   required emergent tPA treatment  . Hypertension   . Shortness of breath    . Sleep apnea     Past Surgical History:  Procedure Laterality Date  . BACK SURGERY    . COLONOSCOPY  2009   adenoma polyp  . ELBOW ARTHROSCOPY  2010  . LUMBAR PERCUTANEOUS PEDICLE SCREW 3 LEVEL N/A 05/25/2013   Procedure: Posterior Percutaneous Fixation from Thoracic nine to Lumbar one vertebrae with arthrodesis of Thoracic eleven Fracture;  Surgeon: Kristeen Miss, MD;  Location: [facility];  Service: Neurosurgery;  Laterality: N/A;  Posterior Percutaneous Fixation from Thoracic nine to Lumbar one vertebrae with arthrodesis of Thoracic eleven Fracture     Medications:  Outpatient Encounter Medications as of 02/21/2019  Medication Sig Case  . ACETAMINOPHEN PO Take by mouth.   Marland Kitchen atorvastatin (LIPITOR) 20 MG tablet Take 20 mg by mouth daily.   . Dapagliflozin Propanediol (FARXIGA) 10 MG TABS Take 10 mg by mouth daily.   Marland Kitchen glipiZIDE (GLUCOTROL XL) 5 MG 24 hr tablet  04/02/2016: Received from: External Pharmacy  . insulin detemir (LEVEMIR) 100 UNIT/ML injection Inject 40 Units into the skin at bedtime.    Marland Kitchen losartan (COZAAR) 25 MG tablet Take 25 mg by mouth daily.   . metFORMIN (GLUCOPHAGE) 500 MG tablet Take 1,000 mg by mouth daily with breakfast.   . metoprolol tartrate (LOPRESSOR) 25 MG tablet Take 25 mg by mouth daily.   . Rivaroxaban (XARELTO) 20 MG TABS tablet Take 20 mg by mouth daily. 08/04/13 last dose of xarelto in preparation for colonoscopy   . [  DISCONTINUED] diltiazem (CARDIZEM) 60 MG tablet Take 60 mg by mouth daily.    . [DISCONTINUED] lovastatin (MEVACOR) 20 MG tablet Take 20 mg by mouth every morning.    No facility-administered encounter medications on file as of 02/21/2019.     Allergies:  Allergies  Allergen Reactions  . Lisinopril Cough    Family History: Family History  Problem Relation Age of Onset  . Heart disease Brother   . Schizophrenia Mother   . Heart disease Father   . Diabetes Father     Social History: Social History   Tobacco Use  . Smoking  status: Never Smoker  . Smokeless tobacco: Never Used  Substance Use Topics  . Alcohol use: Not Currently    Comment: Previously drinking 3-5 drinks nightly x 20 year  . Drug use: No   Social History   Social History Narrative   Lives with wife      One story home      Highest level edu- masters      Right handed      Working full times in [employer]    Review of Systems:  CONSTITUTIONAL: No fevers, chills, night sweats, +50 lb intentional weight loss.   EYES: No visual changes or eye pain ENT: No hearing changes.  No history of nose bleeds.   RESPIRATORY: No cough, wheezing and shortness of breath.   CARDIOVASCULAR: Negative for chest pain, and palpitations.   GI: Negative for abdominal discomfort, blood in stools or black stools.  No recent change in bowel habits.   GU:  No history of incontinence.   MUSCLOSKELETAL: +history of joint pain or swelling.  No myalgias.   SKIN: Negative for lesions, rash, and itching.   HEMATOLOGY/ONCOLOGY: Negative for prolonged bleeding, bruising easily, and swollen nodes.  No history of cancer.   ENDOCRINE: Negative for cold or heat intolerance, polydipsia or goiter.   PSYCH:  No depression or anxiety symptoms.   NEURO: As Above.   Vital Signs:  BP 120/60   Pulse 72   Ht 5\' 11"  (1.803 m)   Wt 260 lb (117.9 kg)   SpO2 95%   BMI 36.26 kg/m    General Medical Exam:   General:  Well appearing, comfortable.   Eyes/ENT: see cranial nerve examination.   Neck:   No carotid bruits. Respiratory:  Clear to auscultation, good air entry bilaterally.   Cardiac:  Regular rate and rhythm, systolic murmur.   Extremities:  + right elbow deformity from prior injury, mild bilateral hammertoes. no edema, or skin discoloration.  Skin:  No rashes or lesions.  Neurological Exam: MENTAL STATUS including orientation to time, place, person, recent and remote memory, attention span and concentration, language, and fund of knowledge is normal.  Speech  is not dysarthric.  CRANIAL NERVES: II:  No visual field defects.   III-IV-VI: Pupils equal round and reactive to light.  Normal conjugate, extra-ocular eye movements in all directions of gaze.  No nystagmus.  No ptosis.   V:  Normal facial sensation.    VII:  Normal facial symmetry and movements.   VIII:  Normal hearing and vestibular function.   IX-X:  Normal palatal movement.   XI:  Normal shoulder shrug and head rotation.   XII:  Normal tongue strength and range of motion, no deviation or fasciculation.  MOTOR:  No atrophy, fasciculations or abnormal movements.  No pronator drift.   Upper Extremity:  Right  Left  Deltoid  5/5   5/5  Biceps  5/5   5/5   Triceps  5/5   5/5   Infraspinatus 5/5  5/5  Medial pectoralis 5/5  5/5  Wrist extensors  5/5   5/5   Wrist flexors  5/5   5/5   Finger extensors  5/5   5/5   Finger flexors  5/5   5/5   Dorsal interossei  5-/5   5-/5   Abductor pollicis  5-/5   5-/5   Tone (Ashworth scale)  0  0   Lower Extremity:  Right  Left  Hip flexors  5/5   5/5   Hip extensors  5/5   5/5   Adductor 5/5  5/5  Abductor 5/5  5/5  Knee flexors  5/5   5/5   Knee extensors  5/5   5/5   Dorsiflexors  5/5   5/5   Plantarflexors  5/5   5/5   Toe extensors  5/5   5/5   Toe flexors  4/5   4/5   Tone (Ashworth scale)  0  0   MSRs:  Right        Left                  brachioradialis 2+  2+  biceps 2+  2+  triceps 2+  2+  patellar 2+  2+  ankle jerk 0  0  Hoffman no  no  plantar response down  down   SENSORY:  Vibration is 100% at the MCP and knees, 30% at the right ankle, 10% left ankle, and absent at the great toe bilaterally.  Temperature and pin prick is reduced in the feet.  Tinel's sign is positive at the wrist bilaterally.  Romberg's sign is mildly positive.   COORDINATION/GAIT: Normal finger-to- nose-finger .  Intact rapid alternating movements bilaterally.  Able to rise from a chair without using arms.  Gait slightly wide-based and stable.  Tandem and stressed gait intact.    IMPRESSION: 1. Peripheral neuropathy contributed by long history of diabetes and alcohol abuse (sober since 2019) manifesting with numbness in the feet and mild sensory ataxia.  I had extensive discussion with the patient regarding the pathogenesis, etiology, management, and natural course of neuropathy. Neuropathy tends to be slowly progressive and management is symptomatic.    - Check vitamin B1, B12, and folate  - No role for medications as there is no pain  - Daily foot inspection  - Fall precautions discussed  - Encouraged him to continue to abstain from alcohol 2.  Bilateral hand paresthesias are suggestive of carpal tunnel syndrome, however I cannot exclude overlapping neuropathy.    - Check NCS/EMG of the arms  Further recommendations pending results.   Thank you for allowing me to participate in patient's care.  If I can answer any additional questions, I would be pleased to do so.    Sincerely,    Deny Chevez K. Posey Pronto, DO

## 2019-02-21 NOTE — Patient Instructions (Addendum)
Check labs  NCS/EMG of the arms   ELECTROMYOGRAM AND NERVE CONDUCTION STUDIES (EMG/NCS) INSTRUCTIONS  How to Prepare The neurologist conducting the EMG will need to know if you have certain medical conditions. Tell the neurologist and other EMG lab personnel if you: . Have a pacemaker or any other electrical medical device . Take blood-thinning medications . Have hemophilia, a blood-clotting disorder that causes prolonged bleeding Bathing Take a shower or bath shortly before your exam in order to remove oils from your skin. Don't apply lotions or creams before the exam.  What to Expect You'll likely be asked to change into a hospital gown for the procedure and lie down on an examination table. The following explanations can help you understand what will happen during the exam.  . Electrodes. The neurologist or a technician places surface electrodes at various locations on your skin depending on where you're experiencing symptoms. Or the neurologist may insert needle electrodes at different sites depending on your symptoms.  . Sensations. The electrodes will at times transmit a tiny electrical current that you may feel as a twinge or spasm. The needle electrode may cause discomfort or pain that usually ends shortly after the needle is removed. If you are concerned about discomfort or pain, you may want to talk to the neurologist about taking a short break during the exam.  . Instructions. During the needle EMG, the neurologist will assess whether there is any spontaneous electrical activity when the muscle is at rest - activity that isn't present in healthy muscle tissue - and the degree of activity when you slightly contract the muscle.  He or she will give you instructions on resting and contracting a muscle at appropriate times. Depending on what muscles and nerves the neurologist is examining, he or she may ask you to change positions during the exam.  After your EMG You may experience some  temporary, minor bruising where the needle electrode was inserted into your muscle. This bruising should fade within several days. If it persists, contact your primary care doctor.

## 2019-02-22 DIAGNOSIS — E118 Type 2 diabetes mellitus with unspecified complications: Secondary | ICD-10-CM | POA: Diagnosis not present

## 2019-02-22 DIAGNOSIS — E7849 Other hyperlipidemia: Secondary | ICD-10-CM | POA: Diagnosis not present

## 2019-02-22 DIAGNOSIS — R82998 Other abnormal findings in urine: Secondary | ICD-10-CM | POA: Diagnosis not present

## 2019-02-22 LAB — B12 AND FOLATE PANEL
Folate: >24 ng/mL
Vitamin B-12: 283 pg/mL (ref 200–1100)

## 2019-02-26 LAB — VITAMIN B1: Vitamin B1 (Thiamine): 20 nmol/L (ref 8–30)

## 2019-03-01 DIAGNOSIS — E118 Type 2 diabetes mellitus with unspecified complications: Secondary | ICD-10-CM | POA: Diagnosis not present

## 2019-03-01 DIAGNOSIS — L4050 Arthropathic psoriasis, unspecified: Secondary | ICD-10-CM | POA: Diagnosis not present

## 2019-03-01 DIAGNOSIS — R2681 Unsteadiness on feet: Secondary | ICD-10-CM | POA: Diagnosis not present

## 2019-03-01 DIAGNOSIS — Z1331 Encounter for screening for depression: Secondary | ICD-10-CM | POA: Diagnosis not present

## 2019-03-01 DIAGNOSIS — M810 Age-related osteoporosis without current pathological fracture: Secondary | ICD-10-CM | POA: Diagnosis not present

## 2019-03-01 DIAGNOSIS — Z7901 Long term (current) use of anticoagulants: Secondary | ICD-10-CM | POA: Diagnosis not present

## 2019-03-01 DIAGNOSIS — E1140 Type 2 diabetes mellitus with diabetic neuropathy, unspecified: Secondary | ICD-10-CM | POA: Diagnosis not present

## 2019-03-01 DIAGNOSIS — I10 Essential (primary) hypertension: Secondary | ICD-10-CM | POA: Diagnosis not present

## 2019-03-01 DIAGNOSIS — I2699 Other pulmonary embolism without acute cor pulmonale: Secondary | ICD-10-CM | POA: Diagnosis not present

## 2019-03-01 DIAGNOSIS — Z0000 Encounter for general adult medical examination without abnormal findings: Secondary | ICD-10-CM | POA: Diagnosis not present

## 2019-03-01 DIAGNOSIS — E785 Hyperlipidemia, unspecified: Secondary | ICD-10-CM | POA: Diagnosis not present

## 2019-03-01 DIAGNOSIS — G4733 Obstructive sleep apnea (adult) (pediatric): Secondary | ICD-10-CM | POA: Diagnosis not present

## 2019-03-01 DIAGNOSIS — Z1339 Encounter for screening examination for other mental health and behavioral disorders: Secondary | ICD-10-CM | POA: Diagnosis not present

## 2019-03-08 DIAGNOSIS — L4050 Arthropathic psoriasis, unspecified: Secondary | ICD-10-CM | POA: Diagnosis not present

## 2019-03-24 ENCOUNTER — Other Ambulatory Visit: Payer: Self-pay

## 2019-03-24 ENCOUNTER — Ambulatory Visit (INDEPENDENT_AMBULATORY_CARE_PROVIDER_SITE_OTHER): Payer: Medicare Other | Admitting: Neurology

## 2019-03-24 ENCOUNTER — Other Ambulatory Visit (INDEPENDENT_AMBULATORY_CARE_PROVIDER_SITE_OTHER): Payer: Medicare Other

## 2019-03-24 DIAGNOSIS — E1142 Type 2 diabetes mellitus with diabetic polyneuropathy: Secondary | ICD-10-CM

## 2019-03-24 DIAGNOSIS — G621 Alcoholic polyneuropathy: Secondary | ICD-10-CM | POA: Diagnosis not present

## 2019-03-24 NOTE — Patient Instructions (Signed)
Your provider has requested that you have labwork completed today. Please go to [facility] ([id number]) on the second floor of this building before leaving the office today. You do not need to check in. If you are not called within 15 minutes please check with the front desk.   

## 2019-03-24 NOTE — Progress Notes (Signed)
Follow-up Visit   Date: 03/24/19   Anthony Case MRN: [id number] DOB: 1951-07-08   Interim History: Anthony Case is a 68 y.o. 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  The patient was accompanied to the clinic by self.  History of present illness: Starting around ~2005, he began having numbness involving the toes and soles of the feet which has steadily progressed to involve up to the level of the ankles.  Starting in the fall of 2019, he began having numbness in the hands, which is worse at night time.  He often wakes up having to shake his hands to wake them up.  He has some weakness with grip.  He was diagnosed with diabetes in ~1999.   He was drinking 3-5 drinks nightly for 20 years and has been sober since May 2019.    In 2019, he made positive lifestyle changes including diet, exercise, and alcohol cessation.  He reports loosing about 50lb.  He continues to walk regularly, but has been unable to strengthen his upper body due as he is unable to got to the gym.  His diabetes is well-controlled, last HbA1c 5.3.   UPDATE 03/24/2019: He is here for electrodiagnostic testing of the hands.  He continues to have numbness and tingling as well as weakness in the hands.  His vitamin B12 level was low-normal and he was recommended to start B12 1000 mcg daily over-the-counter.  His folate and vitamin B1 levels were normal.    Medications:  Current Outpatient Medications on File Prior to Visit  Medication Sig Dispense Refill  . ACETAMINOPHEN PO Take by mouth.    Marland Kitchen atorvastatin (LIPITOR) 20 MG tablet Take 20 mg by mouth daily.    . Dapagliflozin Propanediol (FARXIGA) 10 MG TABS Take 10 mg by mouth daily.    Marland Kitchen glipiZIDE (GLUCOTROL XL) 5 MG 24 hr tablet     . insulin detemir  (LEVEMIR) 100 UNIT/ML injection Inject 40 Units into the skin at bedtime.     Marland Kitchen losartan (COZAAR) 25 MG tablet Take 25 mg by mouth daily.    . metFORMIN (GLUCOPHAGE) 500 MG tablet Take 1,000 mg by mouth daily with breakfast.    . metoprolol tartrate (LOPRESSOR) 25 MG tablet Take 25 mg by mouth daily.    . Rivaroxaban (XARELTO) 20 MG TABS tablet Take 20 mg by mouth daily. 08/04/13 last dose of xarelto in preparation for colonoscopy     No current facility-administered medications on file prior to visit.     Allergies:  Allergies  Allergen Reactions  . Lisinopril Cough    Review of Systems:  CONSTITUTIONAL: No fevers, chills, night sweats, or weight loss.  EYES: No visual changes or eye pain ENT: No hearing changes.  No history of nose bleeds.   RESPIRATORY: No cough, wheezing and shortness of breath.   CARDIOVASCULAR: Negative for chest pain, and palpitations.   GI: Negative for abdominal discomfort, blood in stools or black stools.  No recent change in bowel habits.   GU:  No history of incontinence.   MUSCLOSKELETAL: No history of joint pain or swelling.  No myalgias.   SKIN: Negative for lesions, rash, and itching.   ENDOCRINE: Negative for cold or heat intolerance, polydipsia or goiter.   PSYCH:  No depression + anxiety symptoms.   NEURO: As Above.  Neurological Exam: MENTAL STATUS including orientation to time, place, person, recent and remote memory, attention span and concentration, language, and fund of knowledge is normal.  Speech is not dysarthric.  CRANIAL NERVES:    Face is symmetric.   MOTOR:  Motor strength is 5/5 in all extremities, extensors 4/5 in finger abductors and toe flexors.  There is mild intrinsic hand muscle atrophy, especially ulnar innervated muscles.   No pronator drift.  Tone is normal.    MSRs:  Reflexes are 2+/4 throughout, except absent bilateral Achilles  SENSORY: Reduced temperature in a gradient pattern distal to mid cough bilaterally.   COORDINATION/GAIT:   Gait narrow based and stable.   Data: Lab Results  Component Value Date   [id number] 340 02/21/2019     IMPRESSION/PLAN: Peripheral neuropathy contributed by long history of diabetes and alcohol abuse, sober since 2019.  Electrodiagnostic testing was performed today which shows severe sensorimotor polyneuropathy, with axonal and demyelinating features, as well as superimposed right C5 radiculopathy.  I will check a few additional labs as noted below.  If these are normal, I will discuss with him about possible lumbar puncture to be sure we are not missing immune mediated process, like CIDP.  PLAN/RECOMMENDATIONS:  Check ESR, CRP, MMA, copper, ceruloplasmin, SPEP with IFE  Further recommendations pending results.   Thank you for allowing me to participate in patient's care.  If I can answer any additional questions, I would be pleased to do so.    Sincerely,    Kelda Azad K. Posey Pronto, DO

## 2019-03-24 NOTE — Procedures (Signed)
[facility]  [address]  [place], [place] [address] Tel: [phone] Fax:  [phone] Test Date:  03/24/2019  Patient: Anthony Case DOB: 1951-03-02 Physician: Narda Amber, DO  Sex: Male Height: 5\' 11"  Ref Phys: Narda Amber, DO  ID#: [id number] Temp: 33.0C Technician:    Patient Complaints: This is a 68 year old man with diabetes mellitus referred for evaluation of bilateral hand paresthesias.  NCV & EMG Findings: Extensive electrodiagnostic testing of the right upper extremity and additional studies of the left shows:  1. Right median and ulnar sensory responses show prolonged latency (R4.9, R3.6 ms) and reduced amplitude (R4.8, R3.7 V).  Right radial sensory response shows reduced amplitude (6.1 V).  Left median and ulnar sensory responses are absent.  Left radial sensory responses within normal limits. 2. Right median motor response shows prolonged latency (4.5 ms) and normal amplitude.  Right ulnar motor response shows reduced amplitude (4.3 mV), conduction block in the forearm, and conduction velocity slowing across the elbow velocity (A Elbow-B Elbow, 36 m/s).  Left median and ulnar motor responses show prolonged latency (L7.7, L3.5 ms), reduced amplitude (L4.9, L4.6 mV), and slowed conduction velocity.  Bilateral ulnar motor responses showed temporal dispersion. 3. Chronic motor axonal loss changes are seen affecting the distal hand muscles, especially in the ulnar innervated muscles, and worse than the left.  Additionally, on the right, active and chronic denervation seen in the C5 myotome.  Impression: 1. The electrophysiologic findings are most consistent with a sensorimotor neuropathy, with axonal and demyelinating features, affecting the upper extremities.  Overall, these findings are severe in degree electrically. 2. There is evidence of a superimposed right C5 radiculopathy, moderate.   ___________________________ Narda Amber, DO     Nerve Conduction Studies Anti Sensory Summary Table   Site NR Peak (ms) Norm Peak (ms) P-T Amp (V) Norm P-T Amp  Left Median Anti Sensory (2nd Digit)  33C  Wrist NR  <3.8  >10  Right Median Anti Sensory (2nd Digit)  33C  Wrist    4.9 <3.8 4.8 >10  Left Radial Anti Sensory (Base 1st Digit)  33C  Wrist    2.5 <2.8 12.5 >10  Right Radial Anti Sensory (Base 1st Digit)  33C  Wrist    2.6 <2.8 6.1 >10  Left Ulnar Anti Sensory (5th Digit)  33C  Wrist NR  <3.2  >5  Right Ulnar Anti Sensory (5th Digit)  33C  Wrist    3.6 <3.2 3.7 >5   Motor Summary Table   Site NR Onset (ms) Norm Onset (ms) O-P Amp (mV) Norm O-P Amp Site1 Site2 Delta-0 (ms) Dist (cm) Vel (m/s) Norm Vel (m/s)  Left Median Motor (Abd Poll Brev)  33C  Wrist    7.7 <4.0 4.9 >5 Elbow Wrist 6.9 33.0 48 >50  Elbow    14.6  3.9         Right Median Motor (Abd Poll Brev)  33C  Wrist    4.5 <4.0 8.6 >5 Elbow Wrist 6.5 33.0 51 >50  Elbow    11.0  7.7         Left Ulnar Motor (Abd Dig Minimi)  33C  Wrist    3.5 <3.1 4.6 >7 B Elbow Wrist 4.6 26.0 57 >50  B Elbow    8.1  2.0  A Elbow B Elbow 2.7 10.0 37 >50  A Elbow    10.8  1.6         Right Ulnar Motor (Abd  Dig Minimi)  33C  Wrist    2.8 <3.1 4.3 >7 B Elbow Wrist 4.2 25.0 60 >50  B Elbow    7.0  2.6  A Elbow B Elbow 2.8 10.0 36 >50  A Elbow    9.8  2.6          EMG   Side Muscle Ins Act Fibs Psw Fasc Number Recrt Dur Dur. Amp Amp. Poly Poly. Comment  Left Ext Indicis Nml Nml Nml Nml 1- Rapid Nml Nml Nml Nml Nml Nml N/A  Left PronatorTeres Nml Nml Nml Nml Nml Nml Nml Nml Nml Nml Nml Nml N/A  Left 1stDorInt Nml Nml Nml Nml 2- Rapid Many 1+ Many 1+ Some 1+ N/A  Left Triceps Nml Nml Nml Nml Nml Nml Nml Nml Nml Nml Nml Nml N/A  Left Deltoid Nml Nml Nml Nml Nml Nml Nml Nml Nml Nml Nml Nml N/A  Left FlexCarpiUln Nml Nml Nml Nml Nml Nml Nml Nml Nml Nml Nml Nml N/A  Left Biceps Nml Nml Nml Nml Nml Nml Nml Nml Nml Nml Nml Nml N/A  Left ABD Dig Min Nml Nml Nml Nml Nml Nml  Nml Nml Nml Nml Nml Nml N/A  Left Abd Poll Brev Nml Nml Nml Nml 1- Rapid Nml Nml Nml Nml Nml Nml N/A  Right 1stDorInt Nml Nml Nml Nml 2- Rapid Many 1+ Many 1+ Some 1+ ATR  Right Abd Poll Brev Nml Nml Nml Nml 1- Rapid Some 1+ Some 1+ Nml Nml N/A  Right Ext Indicis Nml Nml Nml Nml 1- Rapid Some 1+ Some 1+ Nml Nml N/A  Right PronatorTeres Nml Nml Nml Nml Nml Nml Nml Nml Nml Nml Nml Nml N/A  Right Biceps Nml Nml Nml Nml 2- Rapid Some 1+ Some 1+ Some 1+ N/A  Right Triceps Nml Nml Nml Nml Nml Nml Nml Nml Nml Nml Nml Nml N/A  Right Deltoid Nml 1+ Nml Nml 3- Rapid Many 1+ Many 1+ Some 1+ N/A  Right FlexCarpiUln Nml Nml Nml Nml Nml Nml Nml Nml Nml Nml Nml Nml N/A  Right ABD Dig Min Nml Nml Nml Nml Nml Nml Nml Nml Nml Nml Nml Nml N/A      Waveforms:

## 2019-03-29 LAB — PROTEIN ELECTROPHORESIS, SERUM
Albumin ELP: 3.9 g/dL (ref 3.8–4.8)
Alpha 1: 0.3 g/dL (ref 0.2–0.3)
Alpha 2: 0.8 g/dL (ref 0.5–0.9)
Beta 2: 0.3 g/dL (ref 0.2–0.5)
Beta Globulin: 0.4 g/dL (ref 0.4–0.6)
Gamma Globulin: 0.9 g/dL (ref 0.8–1.7)
Total Protein: 6.6 g/dL (ref 6.1–8.1)

## 2019-03-29 LAB — IMMUNOFIXATION ELECTROPHORESIS
IgG (Immunoglobin G), Serum: 1033 mg/dL (ref 600–1540)
IgM, Serum: 70 mg/dL (ref 50–300)
Immunofix Electr Int: NOT DETECTED
Immunoglobulin A: 170 mg/dL (ref 70–320)

## 2019-03-29 LAB — C-REACTIVE PROTEIN: CRP: 0.6 mg/L (ref <8.0)

## 2019-03-29 LAB — SEDIMENTATION RATE: Sed Rate: 2 mm/h (ref 0–20)

## 2019-03-29 LAB — CERULOPLASMIN: Ceruloplasmin: 23 mg/dL (ref 18–36)

## 2019-03-29 LAB — COPPER, SERUM: Copper: 93 ug/dL (ref 70–175)

## 2019-03-31 ENCOUNTER — Telehealth: Payer: Self-pay

## 2019-03-31 NOTE — Telephone Encounter (Signed)
-----   Message from Alda Berthold, DO sent at 03/31/2019  7:44 AM EDT ----- Please notify patient lab are within normal limits.  Thank you.

## 2019-03-31 NOTE — Telephone Encounter (Signed)
Pt notified of results of labs

## 2019-04-05 ENCOUNTER — Other Ambulatory Visit: Payer: Self-pay

## 2019-04-05 ENCOUNTER — Ambulatory Visit (INDEPENDENT_AMBULATORY_CARE_PROVIDER_SITE_OTHER): Payer: Medicare Other | Admitting: Podiatry

## 2019-04-05 ENCOUNTER — Encounter: Payer: Self-pay | Admitting: Podiatry

## 2019-04-05 DIAGNOSIS — B351 Tinea unguium: Secondary | ICD-10-CM

## 2019-04-05 DIAGNOSIS — E1140 Type 2 diabetes mellitus with diabetic neuropathy, unspecified: Secondary | ICD-10-CM | POA: Insufficient documentation

## 2019-04-05 DIAGNOSIS — E1142 Type 2 diabetes mellitus with diabetic polyneuropathy: Secondary | ICD-10-CM

## 2019-04-05 DIAGNOSIS — M79675 Pain in left toe(s): Secondary | ICD-10-CM

## 2019-04-05 NOTE — Progress Notes (Signed)
This patient presents to the office for his annual diabetic foot exam.  He says he has deformed big toenail left foot.  He also has been tested and treated for neuropathy in his fingers.  He presents for diabetic foot exam.  Vascular  Dorsalis pedis and posterior tibial pulses are palpable  B/L.  Capillary return  WNL.  Temperature gradient is  WNL.  Skin turgor  WNL  Sensorium  Senn Weinstein monofilament wire  WNL left foot.  LOPS absent right foot.. Muscle power WNL.  Nail Exam  Patient has normal nails with no evidence of bacterial or fungal infection.  Orthopedic  Exam  Muscle tone and muscle strength  WNL.  No limitations of motion feet  B/L.  No crepitus or joint effusion noted.  Foot type is unremarkable and digits show no abnormalities.  HAV 1st MPJ  B/L.Marland Kitchen DJD right midfoot.    Skin  No open lesions.  Normal skin texture and turgor.  Onychomycosis left hallux.  Diabetic neuropathy.  ROV.  Debride left hallux nail.  Vascular finding  WNL.  LOPS WNL left foot.  LOPS absent right foot.  Discussed his diabetes with this patient.  RTC 1 year.   Gardiner Barefoot DPM

## 2019-04-14 DIAGNOSIS — E113299 Type 2 diabetes mellitus with mild nonproliferative diabetic retinopathy without macular edema, unspecified eye: Secondary | ICD-10-CM | POA: Diagnosis not present

## 2019-05-03 DIAGNOSIS — L4050 Arthropathic psoriasis, unspecified: Secondary | ICD-10-CM | POA: Diagnosis not present

## 2019-05-04 DIAGNOSIS — M545 Low back pain: Secondary | ICD-10-CM | POA: Diagnosis not present

## 2019-05-04 DIAGNOSIS — L4050 Arthropathic psoriasis, unspecified: Secondary | ICD-10-CM | POA: Diagnosis not present

## 2019-05-04 DIAGNOSIS — Z79899 Other long term (current) drug therapy: Secondary | ICD-10-CM | POA: Diagnosis not present

## 2019-05-04 DIAGNOSIS — Z6833 Body mass index (BMI) 33.0-33.9, adult: Secondary | ICD-10-CM | POA: Diagnosis not present

## 2019-05-04 DIAGNOSIS — E669 Obesity, unspecified: Secondary | ICD-10-CM | POA: Diagnosis not present

## 2019-05-04 DIAGNOSIS — L409 Psoriasis, unspecified: Secondary | ICD-10-CM | POA: Diagnosis not present

## 2019-05-04 DIAGNOSIS — M150 Primary generalized (osteo)arthritis: Secondary | ICD-10-CM | POA: Diagnosis not present

## 2019-05-28 DIAGNOSIS — Z23 Encounter for immunization: Secondary | ICD-10-CM | POA: Diagnosis not present

## 2019-06-21 NOTE — Progress Notes (Signed)
Primary Physician/Referring:  Leanna Battles, MD  Patient ID: Anthony Case, male    DOB: 1951-04-16, 68 y.o.   MRN: [id number]  No chief complaint on file.  HPI:    Anthony Case  is a 68 y.o. Caucasian male with history of PE in 2013 after a long travel and a spontaneous DVT in 2009, now on chronic Xarelto, diabetes mellitus, rheumatoid and psoriatic arthritis, ankylosing spondylitis, hypertension, mild hyperlipidemia, presents for f/u shortness of breath, bicuspid AV and leg edema.  Over the past one year, states that he is doing well and dyspnea has resolved.  Except for arthritis he has no specific complaints today.  Past Medical History:  Diagnosis Date  . Arthritis   . Diabetes mellitus   . Hx pulmonary embolism 2009   required emergent tPA treatment  . Hypertension   . Shortness of breath   . Sleep apnea    Past Surgical History:  Procedure Laterality Date  . BACK SURGERY    . COLONOSCOPY  2009   adenoma polyp  . ELBOW ARTHROSCOPY  2010  . LUMBAR PERCUTANEOUS PEDICLE SCREW 3 LEVEL N/A 05/25/2013   Procedure: Posterior Percutaneous Fixation from Thoracic nine to Lumbar one vertebrae with arthrodesis of Thoracic eleven Fracture;  Surgeon: Kristeen Miss, MD;  Location: [facility];  Service: Neurosurgery;  Laterality: N/A;  Posterior Percutaneous Fixation from Thoracic nine to Lumbar one vertebrae with arthrodesis of Thoracic eleven Fracture   Social History   Socioeconomic History  . Marital status: Married    Spouse name: Not on file  . Number of children: 3  . Years of education: Not on file  . Highest education level: Not on file  Occupational History  . Occupation: Surveyor, mining: [employer]  Social Needs  . Financial resource strain: Not on file  . Food insecurity    Worry: Not on file    Inability: Not on file  . Transportation needs    Medical: Not on file    Non-medical: Not on file  Tobacco Use  . Smoking status: Never Smoker  .  Smokeless tobacco: Never Used  Substance and Sexual Activity  . Alcohol use: Not Currently    Comment: Previously drinking 3-5 drinks nightly x 20 year  . Drug use: No  . Sexual activity: Not on file  Lifestyle  . Physical activity    Days per week: Not on file    Minutes per session: Not on file  . Stress: Not on file  Relationships  . Social Herbalist on phone: Not on file    Gets together: Not on file    Attends religious service: Not on file    Active member of club or organization: Not on file    Attends meetings of clubs or organizations: Not on file    Relationship status: Not on file  . Intimate partner violence    Fear of current or ex partner: Not on file    Emotionally abused: Not on file    Physically abused: Not on file    Forced sexual activity: Not on file  Other Topics Concern  . Not on file  Social History Narrative   Lives with wife      One story home      Highest level edu- masters      Right handed      Working full times in Economist   ROS   Review of Systems  Constitution: Negative for chills, decreased appetite, malaise/fatigue and weight gain.  Cardiovascular: Negative for dyspnea on exertion, leg swelling and syncope.  Endocrine: Negative for cold intolerance.  Hematologic/Lymphatic: Does not bruise/bleed easily.  Musculoskeletal: Positive for joint pain (psoriatic arthritis) and joint swelling.  Gastrointestinal: Negative for abdominal pain, anorexia, change in bowel habit, hematochezia and melena.  Neurological: Negative for headaches and light-headedness.  Psychiatric/Behavioral: Negative for depression and substance abuse.  All other systems reviewed and are negative.  Objective   Vitals with BMI 02/21/2019 10/25/2018 06/22/2018  Height '5\' 11"'  '5\' 11"'  '5\' 11"'   Weight 260 lbs 239 lbs 239 lbs  BMI 36.28 53.29 92.42  Systolic 683 - 419  Diastolic 60 - 72  Pulse 72 - 72    There were no vitals taken for this visit.  There is no height or weight on file to calculate BMI.   Physical Exam  Constitutional: He appears well-developed and well-nourished. No distress.  HENT:  Head: Atraumatic.  Eyes: Conjunctivae are normal.  Neck: Neck supple. No JVD present. No thyromegaly present.  Cardiovascular: Normal rate, regular rhythm, S1 normal, S2 normal and intact distal pulses. Exam reveals no gallop.  Murmur heard.  Early systolic murmur is present with a grade of 2/6 at the upper right sternal border. Bilateral pigmented lower extremities from chronic venous insufficiency, there is no leg edema.   There is no JVD.  Pulmonary/Chest: Effort normal and breath sounds normal.  Abdominal: Soft. Bowel sounds are normal.  Musculoskeletal: Normal range of motion.        General: No edema.  Neurological: He is alert.  Skin: Skin is warm and dry.  Psychiatric: He has a normal mood and affect.    Laboratory examination:    02/22/2018: Creatinine 1.0, EGFR 78/90, potassium 4.2, CMP normal. TSH 1.37. Cholesterol 134, triglycerides 130, HDL 36, LDL 72.  No results for input(s): NA, K, CL, CO2, GLUCOSE, BUN, CREATININE, CALCIUM, GFRNONAA, GFRAA in the last 8760 hours. CMP Latest Ref Rng & Units 03/24/2019 04/04/2015 05/23/2013  Glucose 65 - 99 mg/dL - 258(H) -  BUN 6 - 20 mg/dL - 12 -  Creatinine 0.61 - 1.24 mg/dL - 1.01 0.77  Sodium 135 - 145 mmol/L - 138 -  Potassium 3.5 - 5.1 mmol/L - 4.6 -  Chloride 101 - 111 mmol/L - 103 -  CO2 22 - 32 mmol/L - 20(L) -  Calcium 8.9 - 10.3 mg/dL - 9.4 -  Total Protein 6.1 - 8.1 g/dL 6.6 6.7 -  Total Bilirubin 0.3 - 1.2 mg/dL - 0.8 -  Alkaline Phos 38 - 126 U/L - 48 -  AST 15 - 41 U/L - 24 -  ALT 17 - 63 U/L - 20 -   CBC Latest Ref Rng & Units 04/04/2015 05/23/2013 04/07/2012  WBC 4.0 - 10.5 K/uL 8.0 7.3 6.9  Hemoglobin 13.0 - 17.0 g/dL 15.7 14.9 14.5  Hematocrit 39.0 - 52.0 % 45.9 43.7 40.9  Platelets 150 - 400 K/uL 250 242 189   Lipid Panel  No results found for: CHOL,  TRIG, HDL, CHOLHDL, VLDL, LDLCALC, LDLDIRECT HEMOGLOBIN A1C Lab Results  Component Value Date   HGBA1C (H) 05/18/2009    8.2 (NOTE) The ADA recommends the following therapeutic goal for glycemic control related to Hgb A1c measurement: Goal of therapy: <6.5 Hgb A1c  Reference: American Diabetes Association: Clinical Practice Recommendations 2010, Diabetes Care, 2010, 33: (Suppl  1).   MPG 189 05/18/2009   TSH No results for input(s): TSH  in the last 8760 hours. Medications and allergies   Allergies  Allergen Reactions  . Lisinopril Cough     Prior to Admission medications   Medication Sig Start Date End Date Taking? Authorizing Provider  ACETAMINOPHEN PO Take by mouth.    [provider]  atorvastatin (LIPITOR) 20 MG tablet Take 20 mg by mouth daily.    [provider]  Dapagliflozin Propanediol (FARXIGA) 10 MG TABS Take 10 mg by mouth daily.    [provider]  glipiZIDE (GLUCOTROL XL) 2.5 MG 24 hr tablet TAKE 1 TABLET BY MOUTH TWICE DAILY BEFORE BREAKFAST AND SUPPER 02/23/19   [provider]  insulin detemir (LEVEMIR) 100 UNIT/ML injection Inject 40 Units into the skin at bedtime.     [provider]  losartan (COZAAR) 25 MG tablet Take 25 mg by mouth daily.    [provider]  metFORMIN (GLUCOPHAGE) 500 MG tablet Take 1,000 mg by mouth daily with breakfast.    [provider]  metoprolol succinate (TOPROL-XL) 25 MG 24 hr tablet Take 25 mg by mouth daily. 02/19/19   [provider]  metoprolol tartrate (LOPRESSOR) 25 MG tablet Take 25 mg by mouth daily.    [provider]  Rivaroxaban (XARELTO) 20 MG TABS tablet Take 20 mg by mouth daily. 08/04/13 last dose of xarelto in preparation for colonoscopy 04/09/12   Leanna Battles, MD  Vitamin D, Ergocalciferol, (DRISDOL) 1.25 MG (50000 UT) CAPS capsule Take 50,000 Units by mouth once a week. 02/17/19   [provider]     Current Outpatient  Medications  Medication Instructions  . ACETAMINOPHEN PO Oral  . atorvastatin (LIPITOR) 20 mg, Oral, Daily  . dapagliflozin propanediol (FARXIGA) 10 mg, Daily  . glipiZIDE (GLUCOTROL XL) 2.5 MG 24 hr tablet TAKE 1 TABLET BY MOUTH TWICE DAILY BEFORE BREAKFAST AND SUPPER  . insulin detemir (LEVEMIR) 40 Units, Daily at bedtime  . losartan (COZAAR) 25 mg, Oral, Daily  . metFORMIN (GLUCOPHAGE) 1,000 mg, Daily with breakfast  . metoprolol succinate (TOPROL-XL) 25 mg, Oral, Daily  . metoprolol tartrate (LOPRESSOR) 25 mg, Oral, Daily  . rivaroxaban (XARELTO) 20 mg, Daily  . Vitamin D (Ergocalciferol) (DRISDOL) 50,000 Units, Oral, Weekly    Radiology:  No results found.   Cardiac Studies:   Lower extremity venous duplex 12/03/2017:  Venous duplex: Right: There is no evidence of deep vein thrombosis in the lower extremity.There is no evidence of superficial venous thrombosis. No cystic structure found in the popliteal fossa. Left: There is a chronic deep vein thrombosis noted in one of the paired posterior tibial veins in the mid calf. All other deep vein thrombus noted in the 2013 exam appear resolved. There is no evidence of superficial venous thrombosis. No cystic structure found in the popliteal fossa.  Echocardiogram 12/18/2017: Poor echo window. Wall motion abnormality has reduced sensitivity. Left ventricle cavity is normal in size. Moderate concentric hypertrophy of the left ventricle. Normal global wall motion. Doppler evidence of grade II (pseudonormal) diastolic dysfunction. Diastolic dysfunction findings suggests elevated LA/LV end diastolic pressure. Calculated EF 65%. Left atrial cavity is moderately dilated at 4.5 cm. AV is probably bicuspid with fused left and non coronary cusps. There is moderate thickening and mild calcification. Very mild aortic stenosis. Aortic valve peak pressure gradient of 14 and mean gradient of 7 mmHg, calculated aortic valve area 1.81 cm.  Lexiscan  myoview stress test 12/25/2017: 1. Lexiscan stress test performed. Exercise capacity was not assessed. Stress symptoms included dyspnea,  dizziness. Peak BP 154/96 mmHg. The resting electrocardiogram demonstrated normal sinus rhythm, normal resting conduction, VPC and normal rest repolarization. Stress EKG is non diagnostic for ischemia as it is a pharmacologic stress. 2. The overall quality of the study is excellent. There is no evidence of abnormal lung activity. Stress and rest SPECT images demonstrate homogeneous tracer distribution throughout the myocardium. Gated SPECT imaging reveals normal myocardial thickening and wall motion. The left ventricular ejection fraction was normal (55%). Small apical defect likely related to subdiaphragmatic attenuation. 3. Low risk study.  Assessment     ICD-10-CM   1. Bicuspid aortic valve  Q23.1   2. Essential hypertension  I10   3. History of pulmonary embolism  Z86.711   4. Dyspnea on exertion  R06.00     EKG 06/22/2019: Sinus  Rhythm  -First degree A-V block PRi = 274 -Poor R-wave progression -nonspecific -consider old anterior infarct. Normal QT. No significant change from EKG 06/23/2018, inferior T abnormality no longer noted.  Recommendations:   Mr. Jarvin Ogren is a Caucasian male with history of PE in 2013 after a long travel and a spontaneous DVT in 2009, now on chronic Xarelto, diabetes mellitus, rheumatoid and psoriatic arthritis, ankylosing spondylitis, hypertension, mild hyperlipidemia, presents for f/u shortness of breath, bicuspid AV and leg edema.   Past one year, he is getting his 20 pound weight loss that he has had previously, but has continued to remain active and he is enrolled in "Heartline" study Which incorporates collecting data with this Apple watch For atrial fibrillation and also activity.  Although his physical activity is increased, diet continues to remain slightly poor, otherwise he is doing well and essentially  asymptomatic and states that his dyspnea is resolved, arthritis continues to be a problem, no change in her medications were done today.    He states that his lipids are very well controlled and managed by his PCP.  I'll see him back in one year, no change in his physical exam and aortic stenotic murmur appears to be stable.    Adrian Prows, MD, [facility] 06/21/2019, 11:08 PM [facility] [place] Pager: [phone] Office: [phone] If no answer Cell [phone]

## 2019-06-22 ENCOUNTER — Other Ambulatory Visit: Payer: Self-pay

## 2019-06-22 ENCOUNTER — Encounter: Payer: Self-pay | Admitting: Cardiology

## 2019-06-22 ENCOUNTER — Ambulatory Visit (INDEPENDENT_AMBULATORY_CARE_PROVIDER_SITE_OTHER): Payer: Medicare Other | Admitting: Cardiology

## 2019-06-22 VITALS — BP 130/70 | HR 65 | Temp 96.5°F | Ht 71.0 in | Wt 260.2 lb

## 2019-06-22 DIAGNOSIS — R0609 Other forms of dyspnea: Secondary | ICD-10-CM

## 2019-06-22 DIAGNOSIS — I10 Essential (primary) hypertension: Secondary | ICD-10-CM

## 2019-06-22 DIAGNOSIS — Z86711 Personal history of pulmonary embolism: Secondary | ICD-10-CM

## 2019-06-22 DIAGNOSIS — R0600 Dyspnea, unspecified: Secondary | ICD-10-CM

## 2019-06-22 DIAGNOSIS — Q231 Congenital insufficiency of aortic valve: Secondary | ICD-10-CM

## 2019-06-28 DIAGNOSIS — L4050 Arthropathic psoriasis, unspecified: Secondary | ICD-10-CM | POA: Diagnosis not present

## 2019-07-12 DIAGNOSIS — E785 Hyperlipidemia, unspecified: Secondary | ICD-10-CM | POA: Diagnosis not present

## 2019-07-12 DIAGNOSIS — E118 Type 2 diabetes mellitus with unspecified complications: Secondary | ICD-10-CM | POA: Diagnosis not present

## 2019-07-12 DIAGNOSIS — E1140 Type 2 diabetes mellitus with diabetic neuropathy, unspecified: Secondary | ICD-10-CM | POA: Diagnosis not present

## 2019-07-12 DIAGNOSIS — L4050 Arthropathic psoriasis, unspecified: Secondary | ICD-10-CM | POA: Diagnosis not present

## 2019-07-12 DIAGNOSIS — G4733 Obstructive sleep apnea (adult) (pediatric): Secondary | ICD-10-CM | POA: Diagnosis not present

## 2019-07-12 DIAGNOSIS — Z794 Long term (current) use of insulin: Secondary | ICD-10-CM | POA: Diagnosis not present

## 2019-07-12 DIAGNOSIS — I10 Essential (primary) hypertension: Secondary | ICD-10-CM | POA: Diagnosis not present

## 2019-09-05 DIAGNOSIS — E669 Obesity, unspecified: Secondary | ICD-10-CM | POA: Diagnosis not present

## 2019-09-05 DIAGNOSIS — Z79899 Other long term (current) drug therapy: Secondary | ICD-10-CM | POA: Diagnosis not present

## 2019-09-05 DIAGNOSIS — M150 Primary generalized (osteo)arthritis: Secondary | ICD-10-CM | POA: Diagnosis not present

## 2019-09-05 DIAGNOSIS — Z6834 Body mass index (BMI) 34.0-34.9, adult: Secondary | ICD-10-CM | POA: Diagnosis not present

## 2019-09-05 DIAGNOSIS — L409 Psoriasis, unspecified: Secondary | ICD-10-CM | POA: Diagnosis not present

## 2019-09-05 DIAGNOSIS — M545 Low back pain: Secondary | ICD-10-CM | POA: Diagnosis not present

## 2019-09-05 DIAGNOSIS — L4050 Arthropathic psoriasis, unspecified: Secondary | ICD-10-CM | POA: Diagnosis not present

## 2019-10-25 DIAGNOSIS — Z7901 Long term (current) use of anticoagulants: Secondary | ICD-10-CM | POA: Diagnosis not present

## 2019-10-25 DIAGNOSIS — G4733 Obstructive sleep apnea (adult) (pediatric): Secondary | ICD-10-CM | POA: Diagnosis not present

## 2019-10-25 DIAGNOSIS — E118 Type 2 diabetes mellitus with unspecified complications: Secondary | ICD-10-CM | POA: Diagnosis not present

## 2019-10-25 DIAGNOSIS — Z1331 Encounter for screening for depression: Secondary | ICD-10-CM | POA: Diagnosis not present

## 2019-10-25 DIAGNOSIS — L4050 Arthropathic psoriasis, unspecified: Secondary | ICD-10-CM | POA: Diagnosis not present

## 2019-10-25 DIAGNOSIS — E1140 Type 2 diabetes mellitus with diabetic neuropathy, unspecified: Secondary | ICD-10-CM | POA: Diagnosis not present

## 2019-10-25 DIAGNOSIS — R2681 Unsteadiness on feet: Secondary | ICD-10-CM | POA: Diagnosis not present

## 2019-10-25 DIAGNOSIS — I10 Essential (primary) hypertension: Secondary | ICD-10-CM | POA: Diagnosis not present

## 2019-10-25 DIAGNOSIS — Z794 Long term (current) use of insulin: Secondary | ICD-10-CM | POA: Diagnosis not present

## 2019-11-07 DIAGNOSIS — M6281 Muscle weakness (generalized): Secondary | ICD-10-CM | POA: Diagnosis not present

## 2019-11-07 DIAGNOSIS — R262 Difficulty in walking, not elsewhere classified: Secondary | ICD-10-CM | POA: Diagnosis not present

## 2019-11-07 DIAGNOSIS — R2681 Unsteadiness on feet: Secondary | ICD-10-CM | POA: Diagnosis not present

## 2019-11-10 DIAGNOSIS — R262 Difficulty in walking, not elsewhere classified: Secondary | ICD-10-CM | POA: Diagnosis not present

## 2019-11-10 DIAGNOSIS — R2681 Unsteadiness on feet: Secondary | ICD-10-CM | POA: Diagnosis not present

## 2019-11-10 DIAGNOSIS — M6281 Muscle weakness (generalized): Secondary | ICD-10-CM | POA: Diagnosis not present

## 2019-11-15 DIAGNOSIS — R2681 Unsteadiness on feet: Secondary | ICD-10-CM | POA: Diagnosis not present

## 2019-11-15 DIAGNOSIS — M6281 Muscle weakness (generalized): Secondary | ICD-10-CM | POA: Diagnosis not present

## 2019-11-15 DIAGNOSIS — R262 Difficulty in walking, not elsewhere classified: Secondary | ICD-10-CM | POA: Diagnosis not present

## 2019-11-17 DIAGNOSIS — R2681 Unsteadiness on feet: Secondary | ICD-10-CM | POA: Diagnosis not present

## 2019-11-17 DIAGNOSIS — M6281 Muscle weakness (generalized): Secondary | ICD-10-CM | POA: Diagnosis not present

## 2019-11-17 DIAGNOSIS — R262 Difficulty in walking, not elsewhere classified: Secondary | ICD-10-CM | POA: Diagnosis not present

## 2019-11-22 DIAGNOSIS — R262 Difficulty in walking, not elsewhere classified: Secondary | ICD-10-CM | POA: Diagnosis not present

## 2019-11-22 DIAGNOSIS — R2681 Unsteadiness on feet: Secondary | ICD-10-CM | POA: Diagnosis not present

## 2019-11-22 DIAGNOSIS — M6281 Muscle weakness (generalized): Secondary | ICD-10-CM | POA: Diagnosis not present

## 2019-11-24 DIAGNOSIS — M6281 Muscle weakness (generalized): Secondary | ICD-10-CM | POA: Diagnosis not present

## 2019-11-24 DIAGNOSIS — R262 Difficulty in walking, not elsewhere classified: Secondary | ICD-10-CM | POA: Diagnosis not present

## 2019-11-24 DIAGNOSIS — R2681 Unsteadiness on feet: Secondary | ICD-10-CM | POA: Diagnosis not present

## 2019-11-29 DIAGNOSIS — R262 Difficulty in walking, not elsewhere classified: Secondary | ICD-10-CM | POA: Diagnosis not present

## 2019-11-29 DIAGNOSIS — M6281 Muscle weakness (generalized): Secondary | ICD-10-CM | POA: Diagnosis not present

## 2019-11-29 DIAGNOSIS — R2681 Unsteadiness on feet: Secondary | ICD-10-CM | POA: Diagnosis not present

## 2019-12-01 DIAGNOSIS — M6281 Muscle weakness (generalized): Secondary | ICD-10-CM | POA: Diagnosis not present

## 2019-12-01 DIAGNOSIS — R262 Difficulty in walking, not elsewhere classified: Secondary | ICD-10-CM | POA: Diagnosis not present

## 2019-12-01 DIAGNOSIS — R2681 Unsteadiness on feet: Secondary | ICD-10-CM | POA: Diagnosis not present

## 2019-12-06 DIAGNOSIS — R2681 Unsteadiness on feet: Secondary | ICD-10-CM | POA: Diagnosis not present

## 2019-12-06 DIAGNOSIS — M6281 Muscle weakness (generalized): Secondary | ICD-10-CM | POA: Diagnosis not present

## 2019-12-06 DIAGNOSIS — R262 Difficulty in walking, not elsewhere classified: Secondary | ICD-10-CM | POA: Diagnosis not present

## 2019-12-14 DIAGNOSIS — M6281 Muscle weakness (generalized): Secondary | ICD-10-CM | POA: Diagnosis not present

## 2019-12-14 DIAGNOSIS — R2681 Unsteadiness on feet: Secondary | ICD-10-CM | POA: Diagnosis not present

## 2019-12-14 DIAGNOSIS — R262 Difficulty in walking, not elsewhere classified: Secondary | ICD-10-CM | POA: Diagnosis not present

## 2019-12-21 DIAGNOSIS — L4050 Arthropathic psoriasis, unspecified: Secondary | ICD-10-CM | POA: Diagnosis not present

## 2019-12-22 DIAGNOSIS — R2681 Unsteadiness on feet: Secondary | ICD-10-CM | POA: Diagnosis not present

## 2019-12-22 DIAGNOSIS — M6281 Muscle weakness (generalized): Secondary | ICD-10-CM | POA: Diagnosis not present

## 2019-12-22 DIAGNOSIS — R262 Difficulty in walking, not elsewhere classified: Secondary | ICD-10-CM | POA: Diagnosis not present

## 2020-01-03 DIAGNOSIS — E669 Obesity, unspecified: Secondary | ICD-10-CM | POA: Diagnosis not present

## 2020-01-03 DIAGNOSIS — Z79899 Other long term (current) drug therapy: Secondary | ICD-10-CM | POA: Diagnosis not present

## 2020-01-03 DIAGNOSIS — Z6835 Body mass index (BMI) 35.0-35.9, adult: Secondary | ICD-10-CM | POA: Diagnosis not present

## 2020-01-03 DIAGNOSIS — L4050 Arthropathic psoriasis, unspecified: Secondary | ICD-10-CM | POA: Diagnosis not present

## 2020-01-03 DIAGNOSIS — L409 Psoriasis, unspecified: Secondary | ICD-10-CM | POA: Diagnosis not present

## 2020-01-03 DIAGNOSIS — M150 Primary generalized (osteo)arthritis: Secondary | ICD-10-CM | POA: Diagnosis not present

## 2020-01-03 DIAGNOSIS — M545 Low back pain: Secondary | ICD-10-CM | POA: Diagnosis not present

## 2020-03-06 DIAGNOSIS — Z1331 Encounter for screening for depression: Secondary | ICD-10-CM | POA: Diagnosis not present

## 2020-03-06 DIAGNOSIS — Z7901 Long term (current) use of anticoagulants: Secondary | ICD-10-CM | POA: Diagnosis not present

## 2020-03-06 DIAGNOSIS — Z794 Long term (current) use of insulin: Secondary | ICD-10-CM | POA: Diagnosis not present

## 2020-03-06 DIAGNOSIS — I10 Essential (primary) hypertension: Secondary | ICD-10-CM | POA: Diagnosis not present

## 2020-03-06 DIAGNOSIS — R3121 Asymptomatic microscopic hematuria: Secondary | ICD-10-CM | POA: Diagnosis not present

## 2020-03-06 DIAGNOSIS — G4733 Obstructive sleep apnea (adult) (pediatric): Secondary | ICD-10-CM | POA: Diagnosis not present

## 2020-03-06 DIAGNOSIS — E1140 Type 2 diabetes mellitus with diabetic neuropathy, unspecified: Secondary | ICD-10-CM | POA: Diagnosis not present

## 2020-03-06 DIAGNOSIS — F329 Major depressive disorder, single episode, unspecified: Secondary | ICD-10-CM | POA: Diagnosis not present

## 2020-03-06 DIAGNOSIS — E785 Hyperlipidemia, unspecified: Secondary | ICD-10-CM | POA: Diagnosis not present

## 2020-03-06 DIAGNOSIS — Z0000 Encounter for general adult medical examination without abnormal findings: Secondary | ICD-10-CM | POA: Diagnosis not present

## 2020-03-06 DIAGNOSIS — Z1212 Encounter for screening for malignant neoplasm of rectum: Secondary | ICD-10-CM | POA: Diagnosis not present

## 2020-03-06 DIAGNOSIS — R2681 Unsteadiness on feet: Secondary | ICD-10-CM | POA: Diagnosis not present

## 2020-04-03 ENCOUNTER — Other Ambulatory Visit: Payer: Self-pay

## 2020-04-03 ENCOUNTER — Encounter: Payer: Self-pay | Admitting: Podiatry

## 2020-04-03 ENCOUNTER — Ambulatory Visit (INDEPENDENT_AMBULATORY_CARE_PROVIDER_SITE_OTHER): Payer: Medicare Other | Admitting: Podiatry

## 2020-04-03 DIAGNOSIS — M79675 Pain in left toe(s): Secondary | ICD-10-CM

## 2020-04-03 DIAGNOSIS — E1142 Type 2 diabetes mellitus with diabetic polyneuropathy: Secondary | ICD-10-CM

## 2020-04-03 DIAGNOSIS — B351 Tinea unguium: Secondary | ICD-10-CM | POA: Diagnosis not present

## 2020-04-03 NOTE — Progress Notes (Addendum)
This patient returns to my office for at risk foot care.  This patient requires this care by a professional since this patient will be at risk due to having diabetic neuropathy and history of DVT.   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 Patient has lost over 50 pounds in the last year.This patient is unable to cut nails himself since the patient cannot reach his nails.These nails are painful walking and wearing shoes.  This patient presents for at risk foot care today.  General Appearance  Alert, conversant and in no acute stress.  Vascular  Dorsalis pedis and posterior tibial  pulses are palpable  bilaterally.  Capillary return is within normal limits  bilaterally. Temperature is within normal limits  bilaterally.  Neurologic  Senn-Weinstein monofilament wire test within normal limits  Left foot.  LOPS absent tight foot.. Muscle power within normal limits bilaterally.  Nails Thick disfigured discolored nails with subungual debris left hallux toenail.. No evidence of bacterial infection or drainage bilaterally.  Orthopedic  No limitations of motion  feet .  No crepitus or effusions noted.  No bony pathology or digital deformities noted.  HAV  B/L.  DJD right midfoot.  Skin  normotropic skin with no porokeratosis noted bilaterally.  No signs of infections or ulcers noted.     Onychomycosis  Pain in right toes  Pain in left toes  Consent was obtained for treatment procedures.   Mechanical debridement of nails  hallux  Left. performed with a nail nipper.  Filed with dremel without incident.    Return office visit    4 months .                 Told patient to return for periodic foot care and evaluation due to potential at risk complications.   Gardiner Barefoot DPM

## 2020-04-04 ENCOUNTER — Encounter: Payer: Self-pay | Admitting: Podiatry

## 2020-04-20 DIAGNOSIS — Z23 Encounter for immunization: Secondary | ICD-10-CM | POA: Diagnosis not present

## 2020-04-24 DIAGNOSIS — Z79899 Other long term (current) drug therapy: Secondary | ICD-10-CM | POA: Diagnosis not present

## 2020-04-24 DIAGNOSIS — L4050 Arthropathic psoriasis, unspecified: Secondary | ICD-10-CM | POA: Diagnosis not present

## 2020-04-25 DIAGNOSIS — R310 Gross hematuria: Secondary | ICD-10-CM | POA: Diagnosis not present

## 2020-04-28 DIAGNOSIS — L237 Allergic contact dermatitis due to plants, except food: Secondary | ICD-10-CM | POA: Diagnosis not present

## 2020-04-28 DIAGNOSIS — R21 Rash and other nonspecific skin eruption: Secondary | ICD-10-CM | POA: Diagnosis not present

## 2020-05-03 DIAGNOSIS — Z79899 Other long term (current) drug therapy: Secondary | ICD-10-CM | POA: Diagnosis not present

## 2020-05-03 DIAGNOSIS — M150 Primary generalized (osteo)arthritis: Secondary | ICD-10-CM | POA: Diagnosis not present

## 2020-05-03 DIAGNOSIS — Z6833 Body mass index (BMI) 33.0-33.9, adult: Secondary | ICD-10-CM | POA: Diagnosis not present

## 2020-05-03 DIAGNOSIS — M545 Low back pain: Secondary | ICD-10-CM | POA: Diagnosis not present

## 2020-05-03 DIAGNOSIS — L409 Psoriasis, unspecified: Secondary | ICD-10-CM | POA: Diagnosis not present

## 2020-05-03 DIAGNOSIS — R7989 Other specified abnormal findings of blood chemistry: Secondary | ICD-10-CM | POA: Diagnosis not present

## 2020-05-03 DIAGNOSIS — L4050 Arthropathic psoriasis, unspecified: Secondary | ICD-10-CM | POA: Diagnosis not present

## 2020-05-03 DIAGNOSIS — E669 Obesity, unspecified: Secondary | ICD-10-CM | POA: Diagnosis not present

## 2020-05-07 DIAGNOSIS — Z20822 Contact with and (suspected) exposure to covid-19: Secondary | ICD-10-CM | POA: Diagnosis not present

## 2020-05-18 DIAGNOSIS — N200 Calculus of kidney: Secondary | ICD-10-CM | POA: Diagnosis not present

## 2020-05-18 DIAGNOSIS — R310 Gross hematuria: Secondary | ICD-10-CM | POA: Diagnosis not present

## 2020-05-30 DIAGNOSIS — N200 Calculus of kidney: Secondary | ICD-10-CM | POA: Diagnosis not present

## 2020-05-30 DIAGNOSIS — R310 Gross hematuria: Secondary | ICD-10-CM | POA: Diagnosis not present

## 2020-05-30 DIAGNOSIS — N400 Enlarged prostate without lower urinary tract symptoms: Secondary | ICD-10-CM | POA: Diagnosis not present

## 2020-06-19 DIAGNOSIS — L4050 Arthropathic psoriasis, unspecified: Secondary | ICD-10-CM | POA: Diagnosis not present

## 2020-06-19 DIAGNOSIS — Z79899 Other long term (current) drug therapy: Secondary | ICD-10-CM | POA: Diagnosis not present

## 2020-06-20 ENCOUNTER — Ambulatory Visit: Payer: Medicare Other | Admitting: Cardiology

## 2020-06-20 ENCOUNTER — Encounter: Payer: Self-pay | Admitting: Cardiology

## 2020-06-20 ENCOUNTER — Other Ambulatory Visit: Payer: Self-pay

## 2020-06-20 ENCOUNTER — Telehealth: Payer: Self-pay

## 2020-06-20 VITALS — BP 130/77 | HR 60 | Resp 16 | Ht 71.0 in | Wt 252.0 lb

## 2020-06-20 DIAGNOSIS — R0609 Other forms of dyspnea: Secondary | ICD-10-CM

## 2020-06-20 DIAGNOSIS — I10 Essential (primary) hypertension: Secondary | ICD-10-CM | POA: Diagnosis not present

## 2020-06-20 DIAGNOSIS — I739 Peripheral vascular disease, unspecified: Secondary | ICD-10-CM

## 2020-06-20 DIAGNOSIS — Q231 Congenital insufficiency of aortic valve: Secondary | ICD-10-CM

## 2020-06-20 DIAGNOSIS — R0989 Other specified symptoms and signs involving the circulatory and respiratory systems: Secondary | ICD-10-CM | POA: Diagnosis not present

## 2020-06-20 DIAGNOSIS — D6859 Other primary thrombophilia: Secondary | ICD-10-CM

## 2020-06-20 DIAGNOSIS — R0600 Dyspnea, unspecified: Secondary | ICD-10-CM

## 2020-06-20 NOTE — Progress Notes (Signed)
Primary Physician/Referring:  Leanna Battles, MD  Patient ID: Anthony Case, male    DOB: 30-May-1951, 69 y.o.   MRN: [id number]  Chief Complaint  Patient presents with  . Follow-up    1 year  . Biscuspid AV  . Hypertension   HPI:    Anthony Case  is a 69 y.o. Caucasian male with history of PE in 2013 after a long travel and a spontaneous DVT in 2009, now on chronic Xarelto, diabetes mellitus, rheumatoid and psoriatic arthritis, ankylosing spondylitis, hypertension, mild hyperlipidemia, presents for f/u shortness of breath, bicuspid AV. Past Medical History:  Diagnosis Date  . Arthritis   . Diabetes mellitus   . Hx pulmonary embolism 2009   required emergent tPA treatment  . Hypertension   . Shortness of breath   . Sleep apnea    Past Surgical History:  Procedure Laterality Date  . BACK SURGERY    . COLONOSCOPY  2009   adenoma polyp  . ELBOW ARTHROSCOPY  2010  . LUMBAR PERCUTANEOUS PEDICLE SCREW 3 LEVEL N/A 05/25/2013   Procedure: Posterior Percutaneous Fixation from Thoracic nine to Lumbar one vertebrae with arthrodesis of Thoracic eleven Fracture;  Surgeon: Kristeen Miss, MD;  Location: [facility];  Service: Neurosurgery;  Laterality: N/A;  Posterior Percutaneous Fixation from Thoracic nine to Lumbar one vertebrae with arthrodesis of Thoracic eleven Fracture    ROS   Review of Systems  Cardiovascular: Negative for chest pain, dyspnea on exertion and leg swelling.  Musculoskeletal: Positive for arthritis, back pain and joint pain.  Gastrointestinal: Negative for melena.  Neurological: Positive for paresthesias (bilateral feet).   Objective   Vitals with BMI 06/20/2020 06/22/2019 02/21/2019  Height 5\' 11"  5\' 11"  5\' 11"   Weight 252 lbs 260 lbs 3 oz 260 lbs  BMI 35.16 93.71 69.67  Systolic 893 810 175  Diastolic 77 70 60  Pulse 60 65 72    Blood pressure 130/77, pulse 60, resp. rate 16, height 5\' 11"  (1.803 m), weight 252 lb (114.3 kg), SpO2 100 %. Body  mass index is 35.15 kg/m.   Physical Exam Constitutional:      General: He is not in acute distress.    Appearance: He is well-developed.  HENT:     Head: Atraumatic.  Eyes:     Conjunctiva/sclera: Conjunctivae normal.  Neck:     Thyroid: No thyromegaly.     Vascular: No JVD.  Cardiovascular:     Rate and Rhythm: Normal rate and regular rhythm.     Pulses:          Carotid pulses are 2+ on the right side and 2+ on the left side.      Femoral pulses are 2+ on the right side and 2+ on the left side.      Popliteal pulses are 2+ on the right side and 2+ on the left side.       Dorsalis pedis pulses are 0 on the right side and 1+ on the left side.       Posterior tibial pulses are 0 on the right side and 1+ on the left side.     Heart sounds: S1 normal and S2 normal. Murmur heard.  Early systolic murmur is present with a grade of 2/6 at the upper right sternal border.  No gallop.      Comments: Ppigmented right lower extremity from chronic venous insufficiency, there is no leg edema.   There is no JVD. Pulmonary:  Effort: Pulmonary effort is normal.     Breath sounds: Normal breath sounds.  Abdominal:     General: Bowel sounds are normal.     Palpations: Abdomen is soft.  Musculoskeletal:        General: Normal range of motion.  Skin:    General: Skin is warm and dry.  Neurological:     Mental Status: He is alert.    Laboratory examination:   External labs:  Cholesterol, total 131.000 m 02/28/2020 HDL 30 MG/DL 02/28/2020 LDL 36.000 mg 02/28/2020 Triglycerides 326.000 02/28/2020  A1C 7.100 % 02/28/2020 TSH 1.270 02/22/2019  Hemoglobin 15.200 g/d 02/28/2020 Platelets 194.000 X1 12/21/2019  Creatinine, Serum 0.900 mg/ 04/25/2020 Potassium 4.300 MM 10/02/2016 ALT (SGPT) 27.000 uni 02/28/2020   Medications and allergies   Allergies  Allergen Reactions  . Lisinopril Cough    Current Outpatient Medications on File Prior to Visit  Medication Sig Dispense Refill  .  ACETAMINOPHEN PO Take by mouth. 2 tabs qd    . atorvastatin (LIPITOR) 20 MG tablet Take 20 mg by mouth daily.    . Dapagliflozin Propanediol (FARXIGA) 10 MG TABS Take 10 mg by mouth daily.    . furosemide (LASIX) 20 MG tablet Take 20 mg by mouth as needed.    Marland Kitchen glipiZIDE (GLUCOTROL XL) 2.5 MG 24 hr tablet 2 (two) times daily.     Marland Kitchen HYDROcodone-acetaminophen (NORCO/VICODIN) 5-325 MG tablet SMARTSIG:1 Tablet(s) By Mouth Every 12 Hours PRN    . LEVEMIR FLEXTOUCH 100 UNIT/ML FlexPen SMARTSIG:60 Unit(s) SUB-Q Daily    . losartan (COZAAR) 50 MG tablet Take 50 mg by mouth daily.    . metFORMIN (GLUCOPHAGE) 500 MG tablet Take 1,000 mg by mouth daily with breakfast.    . metoprolol succinate (TOPROL-XL) 25 MG 24 hr tablet Take 25 mg by mouth daily.    . NON FORMULARY Trimix injection    . Rivaroxaban (XARELTO) 20 MG TABS tablet Take 20 mg by mouth daily. 08/04/13 last dose of xarelto in preparation for colonoscopy    . Vitamin D, Ergocalciferol, (DRISDOL) 1.25 MG (50000 UT) CAPS capsule Take 50,000 Units by mouth once a week.     No current facility-administered medications on file prior to visit.    Radiology:   CT angiogram chest 12/16/2017: No evidence of pulmonary embolus. Dependent atelectasis. Cardiovascular: No filling defects in the pulmonary arteries to suggest pulmonary emboli. Insert Heart diffuse coronary artery calcifications. Scattered aortic calcifications. Extensive coronary artery calcifications/disease.  Cardiac Studies:   Lower extremity venous duplex 12/03/2017:  Venous duplex: Right: There is no evidence of deep vein thrombosis in the lower extremity.There is no evidence of superficial venous thrombosis. No cystic structure found in the popliteal fossa. Left: There is a chronic deep vein thrombosis noted in one of the paired posterior tibial veins in the mid calf. All other deep vein thrombus noted in the 2013 exam appear resolved. There is no evidence of superficial venous  thrombosis. No cystic structure found in the popliteal fossa.  Echocardiogram 12/18/2017: Poor echo window. Wall motion abnormality has reduced sensitivity. Left ventricle cavity is normal in size. Moderate concentric hypertrophy of the left ventricle. Normal global wall motion. Doppler evidence of grade II (pseudonormal) diastolic dysfunction. Diastolic dysfunction findings suggests elevated LA/LV end diastolic pressure. Calculated EF 65%. Left atrial cavity is moderately dilated at 4.5 cm. AV is probably bicuspid with fused left and non coronary cusps. There is moderate thickening and mild calcification. Very mild aortic stenosis. Aortic valve peak pressure gradient  of 14 and mean gradient of 7 mmHg, calculated aortic valve area 1.81 cm.  Lexiscan myoview stress test 12/25/2017: 1. Lexiscan stress test performed. Exercise capacity was not assessed. Stress symptoms included dyspnea, dizziness. Peak BP 154/96 mmHg. The resting electrocardiogram demonstrated normal sinus rhythm, normal resting conduction, VPC and normal rest repolarization. Stress EKG is non diagnostic for ischemia as it is a pharmacologic stress. 2. The overall quality of the study is excellent. There is no evidence of abnormal lung activity. Stress and rest SPECT images demonstrate homogeneous tracer distribution throughout the myocardium. Gated SPECT imaging reveals normal myocardial thickening and wall motion. The left ventricular ejection fraction was normal (55%). Small apical defect likely related to subdiaphragmatic attenuation. 3. Low risk study.  EKG:     EKG 06/20/2020: Sinus rhythm with first-degree block at rate of 62 bpm, inferior infarct old, anterolateral infarct old.  Low-voltage complexes.  No evidence of ischemia, normal QT interval.  No significant change from 06/22/2019.  Assessment     ICD-10-CM   1. Essential hypertension  I10 EKG 12-Lead    PCV ECHOCARDIOGRAM COMPLETE  2. Bicuspid aortic valve  Q23.1 PCV  ECHOCARDIOGRAM COMPLETE    PCV AORTA DUPLEX  3. Dyspnea on exertion  R06.00 PCV ECHOCARDIOGRAM COMPLETE  4. Abdominal bruit  R09.89 PCV AORTA DUPLEX  5. Primary hypercoagulable state ([place])  D68.59   6. PAD (peripheral artery disease) (HCC)  I73.9 PCV ANKLE BRACHIAL INDEX (ABI)   No orders of the defined types were placed in this encounter.   Medications Discontinued During This Encounter  Medication Reason  . metoprolol tartrate (LOPRESSOR) 25 MG tablet Duplicate     Recommendations:   Anthony Case is a 69 y.o. Caucasian male with history of PE in 2013 after a long travel and a spontaneous DVT in 2009, now on chronic Xarelto, diabetes mellitus, rheumatoid and psoriatic arthritis, ankylosing spondylitis, hypertension, mild hyperlipidemia, presents for f/u shortness of breath, bicuspid AV.   He is enrolled in "Heartline" study Which incorporates collecting data with this Apple watch For atrial fibrillation and also physical activity.  Presently doing well, denies worsening dyspnea, states that his activity level has remained stable.  He has not had any leg edema except mild trace edema in his left leg which he had DVT remotely.  I would like to repeat echocardiogram to follow-up on bicuspid valve and also for aortic root dilatation.  Blood pressure is well controlled.  I reviewed his lipids, under excellent control as well.  Aortic ejection systolic murmur is slightly more prominent.  I also hear it in the epigastrium, would like to also obtain abdominal aortic duplex to evaluate for abdominal attic atherosclerosis and also will exclude abdominal with aneurysm as he has bicuspid valve and aortopathy.  He complains of tingling and numbness in his feet, he has absent pedal pulses on the right but no clinical evidence of acute limb ischemia.  Will obtain ABI as baseline.  He is on appropriate aggressive medical therapy with regard to his diabetes and also hyperlipidemia.  I will see him  back in 1 year or sooner if problems.   This was a 40-minute encounter with review of his external records/labs, discussions regarding peripheral neuropathy, peripheral arterial disease, ankylosing spondylitis and relationship to the heart disease and bicuspid aortic valve, evaluation of his complex medications.   Adrian Prows, MD, [facility] 06/20/2020, 1:13 PM Office: [phone] Pager: [phone]

## 2020-06-20 NOTE — Telephone Encounter (Signed)
Patient provided all medication in bag. Patient assisted in taking medications out to reconcile and was handed controlled medication to place back into the bag himself. Medications were reconciled.

## 2020-06-21 ENCOUNTER — Ambulatory Visit: Payer: Medicare Other | Admitting: Cardiology

## 2020-07-12 DIAGNOSIS — Z23 Encounter for immunization: Secondary | ICD-10-CM | POA: Diagnosis not present

## 2020-07-26 ENCOUNTER — Telehealth: Payer: Self-pay | Admitting: Podiatry

## 2020-07-26 NOTE — Telephone Encounter (Signed)
Pt left message asking for a call back that he needed to r/s his appt for next week.  I returned call and left message for pt to call back and r/s.

## 2020-07-31 ENCOUNTER — Ambulatory Visit: Payer: Medicare Other | Admitting: Podiatry

## 2020-08-03 ENCOUNTER — Ambulatory Visit (INDEPENDENT_AMBULATORY_CARE_PROVIDER_SITE_OTHER): Payer: Medicare Other | Admitting: Podiatry

## 2020-08-03 ENCOUNTER — Other Ambulatory Visit: Payer: Self-pay

## 2020-08-03 ENCOUNTER — Encounter: Payer: Self-pay | Admitting: Podiatry

## 2020-08-03 DIAGNOSIS — M79675 Pain in left toe(s): Secondary | ICD-10-CM | POA: Diagnosis not present

## 2020-08-03 DIAGNOSIS — B351 Tinea unguium: Secondary | ICD-10-CM | POA: Diagnosis not present

## 2020-08-03 DIAGNOSIS — S91112A Laceration without foreign body of left great toe without damage to nail, initial encounter: Secondary | ICD-10-CM | POA: Diagnosis not present

## 2020-08-03 DIAGNOSIS — E1142 Type 2 diabetes mellitus with diabetic polyneuropathy: Secondary | ICD-10-CM

## 2020-08-03 NOTE — Progress Notes (Signed)
This patient returns to my office for at risk foot care.  This patient requires this care by a professional since this patient will be at risk due to having diabetic neuropathy and history of DVT.   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 Patient has lost over 50 pounds in the last year.This patient is unable to cut nails himself since the patient cannot reach his nails.These nails are painful walking and wearing shoes. This patient also admits injuring his big toe left foot yesterday.  Patient says there was significant bleeding due to xarelto .  He presents to the office wearing bandage on his  Left big toe. This patient presents for at risk foot care today.  General Appearance  Alert, conversant and in no acute stress.  Vascular  Dorsalis pedis and posterior tibial  pulses are palpable  bilaterally.  Capillary return is within normal limits  bilaterally. Temperature is within normal limits  bilaterally.  Neurologic  Senn-Weinstein monofilament wire test within normal limits  Left foot.  LOPS absent tight foot.. Muscle power within normal limits bilaterally.  Nails Thick disfigured discolored nails with subungual debris left hallux toenail.. No evidence of bacterial infection or drainage bilaterally.  Orthopedic  No limitations of motion  feet .  No crepitus or effusions noted.  No bony pathology or digital deformities noted.  HAV  B/L.  DJD right midfoot.  Skin  normotropic skin with no porokeratosis noted bilaterally.  No signs of infections or ulcers noted.     Onychomycosis  Pain in right toes  Pain in left toes  Consent was obtained for treatment procedures.   Mechanical debridement of nails left hallux. performed with a nail nipper.  Filed with dremel without incident.  Examination of his laceration reveals superficial skin lesion with no evidence of infection.  Neosporin/DSD.  Told his to peroxide hallux .  If conditions worsen call the office.  Return office visit    4 months .                  Told patient to return for periodic foot care and evaluation due to potential at risk complications.   Gardiner Barefoot DPM

## 2020-08-14 DIAGNOSIS — L4050 Arthropathic psoriasis, unspecified: Secondary | ICD-10-CM | POA: Diagnosis not present

## 2020-09-11 DIAGNOSIS — W540XXA Bitten by dog, initial encounter: Secondary | ICD-10-CM | POA: Diagnosis not present

## 2020-09-11 DIAGNOSIS — E1129 Type 2 diabetes mellitus with other diabetic kidney complication: Secondary | ICD-10-CM | POA: Diagnosis not present

## 2020-09-11 DIAGNOSIS — Z23 Encounter for immunization: Secondary | ICD-10-CM | POA: Diagnosis not present

## 2020-09-28 DIAGNOSIS — Q230 Congenital stenosis of aortic valve: Secondary | ICD-10-CM | POA: Diagnosis not present

## 2020-09-28 DIAGNOSIS — R2681 Unsteadiness on feet: Secondary | ICD-10-CM | POA: Diagnosis not present

## 2020-09-28 DIAGNOSIS — Z794 Long term (current) use of insulin: Secondary | ICD-10-CM | POA: Diagnosis not present

## 2020-09-28 DIAGNOSIS — Z7901 Long term (current) use of anticoagulants: Secondary | ICD-10-CM | POA: Diagnosis not present

## 2020-09-28 DIAGNOSIS — E785 Hyperlipidemia, unspecified: Secondary | ICD-10-CM | POA: Diagnosis not present

## 2020-09-28 DIAGNOSIS — Q231 Congenital insufficiency of aortic valve: Secondary | ICD-10-CM | POA: Diagnosis not present

## 2020-09-28 DIAGNOSIS — G4733 Obstructive sleep apnea (adult) (pediatric): Secondary | ICD-10-CM | POA: Diagnosis not present

## 2020-09-28 DIAGNOSIS — D6859 Other primary thrombophilia: Secondary | ICD-10-CM | POA: Diagnosis not present

## 2020-09-28 DIAGNOSIS — I10 Essential (primary) hypertension: Secondary | ICD-10-CM | POA: Diagnosis not present

## 2020-09-28 DIAGNOSIS — E1129 Type 2 diabetes mellitus with other diabetic kidney complication: Secondary | ICD-10-CM | POA: Diagnosis not present

## 2020-09-28 DIAGNOSIS — L97521 Non-pressure chronic ulcer of other part of left foot limited to breakdown of skin: Secondary | ICD-10-CM | POA: Diagnosis not present

## 2020-10-02 DIAGNOSIS — L4050 Arthropathic psoriasis, unspecified: Secondary | ICD-10-CM | POA: Diagnosis not present

## 2020-10-02 DIAGNOSIS — L409 Psoriasis, unspecified: Secondary | ICD-10-CM | POA: Diagnosis not present

## 2020-10-02 DIAGNOSIS — M545 Low back pain, unspecified: Secondary | ICD-10-CM | POA: Diagnosis not present

## 2020-10-02 DIAGNOSIS — M79675 Pain in left toe(s): Secondary | ICD-10-CM | POA: Diagnosis not present

## 2020-10-02 DIAGNOSIS — Z6833 Body mass index (BMI) 33.0-33.9, adult: Secondary | ICD-10-CM | POA: Diagnosis not present

## 2020-10-02 DIAGNOSIS — E669 Obesity, unspecified: Secondary | ICD-10-CM | POA: Diagnosis not present

## 2020-10-02 DIAGNOSIS — R7989 Other specified abnormal findings of blood chemistry: Secondary | ICD-10-CM | POA: Diagnosis not present

## 2020-10-02 DIAGNOSIS — Z79899 Other long term (current) drug therapy: Secondary | ICD-10-CM | POA: Diagnosis not present

## 2020-10-02 DIAGNOSIS — M150 Primary generalized (osteo)arthritis: Secondary | ICD-10-CM | POA: Diagnosis not present

## 2020-10-11 DIAGNOSIS — E113293 Type 2 diabetes mellitus with mild nonproliferative diabetic retinopathy without macular edema, bilateral: Secondary | ICD-10-CM | POA: Diagnosis not present

## 2020-10-11 DIAGNOSIS — E113299 Type 2 diabetes mellitus with mild nonproliferative diabetic retinopathy without macular edema, unspecified eye: Secondary | ICD-10-CM | POA: Diagnosis not present

## 2020-10-11 DIAGNOSIS — L97521 Non-pressure chronic ulcer of other part of left foot limited to breakdown of skin: Secondary | ICD-10-CM | POA: Diagnosis not present

## 2020-10-11 DIAGNOSIS — E11621 Type 2 diabetes mellitus with foot ulcer: Secondary | ICD-10-CM | POA: Diagnosis not present

## 2020-10-11 DIAGNOSIS — L97529 Non-pressure chronic ulcer of other part of left foot with unspecified severity: Secondary | ICD-10-CM | POA: Diagnosis not present

## 2020-10-24 DIAGNOSIS — L4050 Arthropathic psoriasis, unspecified: Secondary | ICD-10-CM | POA: Diagnosis not present

## 2020-11-30 DIAGNOSIS — Z23 Encounter for immunization: Secondary | ICD-10-CM | POA: Diagnosis not present

## 2020-12-04 ENCOUNTER — Other Ambulatory Visit: Payer: Self-pay

## 2020-12-04 ENCOUNTER — Ambulatory Visit (INDEPENDENT_AMBULATORY_CARE_PROVIDER_SITE_OTHER): Payer: Medicare Other | Admitting: Podiatry

## 2020-12-04 ENCOUNTER — Encounter: Payer: Self-pay | Admitting: Podiatry

## 2020-12-04 DIAGNOSIS — M79675 Pain in left toe(s): Secondary | ICD-10-CM | POA: Diagnosis not present

## 2020-12-04 DIAGNOSIS — B351 Tinea unguium: Secondary | ICD-10-CM

## 2020-12-04 DIAGNOSIS — L97509 Non-pressure chronic ulcer of other part of unspecified foot with unspecified severity: Secondary | ICD-10-CM | POA: Diagnosis not present

## 2020-12-04 DIAGNOSIS — E11621 Type 2 diabetes mellitus with foot ulcer: Secondary | ICD-10-CM | POA: Insufficient documentation

## 2020-12-04 DIAGNOSIS — E1142 Type 2 diabetes mellitus with diabetic polyneuropathy: Secondary | ICD-10-CM | POA: Diagnosis not present

## 2020-12-04 NOTE — Progress Notes (Signed)
This patient returns to my office for at risk foot care.  This patient requires this care by a professional since this patient will be at risk due to having diabetic neuropathy and history of DVT.   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 Patient has lost over 50 pounds in the last year.This patient is unable to cut nails himself since the patient cannot reach his nails.These nails are painful walking and wearing shoes. This patient also admits injuring his big toe left foot  over 3 months ago..  Patient says there was significant bleeding due to xarelto .  He presents to the office wearing bandage on his  Left big toe. He says he has been under care of his PCP who recommended he be seen at the wound care clinic.  This patient presents for at risk foot care today.  General Appearance  Alert, conversant and in no acute stress.  Vascular  Dorsalis pedis and posterior tibial  pulses are palpable  bilaterally.  Capillary return is within normal limits  bilaterally. Temperature is within normal limits  bilaterally.  Neurologic  Senn-Weinstein monofilament wire test within normal limits  Left foot.  LOPS absent riight foot.. Muscle power within normal limits bilaterally.  Nails Thick disfigured discolored nails with subungual debris left hallux toenail.. No evidence of bacterial infection or drainage bilaterally.  Orthopedic  No limitations of motion  feet .  No crepitus or effusions noted.  No bony pathology or digital deformities noted.  HAV  B/L.  DJD right midfoot.  Skin  normotropic skin with no porokeratosis noted bilaterally.  No signs of infections or ulcers noted.  Ulcer noted on distal aspect left hallux with necrotic tissue covering an ulcer. No infection or drainage noted.    Onychomycosis  Pain in right toes  Pain in left toes  Ulcer left hallux.  Consent was obtained for treatment procedures.   Mechanical debridement of nails left hallux. performed with a nail nipper.  Filed with  dremel without incident.  Examination of his laceration reveals an ulcer has formed  with no evidence of infection.  Neosporin/DSD.  I recommended he make an appointment with Dr.  Posey Pronto prior to going to the wound care center  Return office visit    3 months .                 Told patient to return for periodic foot care and evaluation due to potential at risk complications.   Gardiner Barefoot DPM

## 2020-12-07 ENCOUNTER — Encounter (HOSPITAL_BASED_OUTPATIENT_CLINIC_OR_DEPARTMENT_OTHER): Payer: Medicare Other | Attending: Internal Medicine | Admitting: Internal Medicine

## 2020-12-07 ENCOUNTER — Other Ambulatory Visit: Payer: Self-pay

## 2020-12-07 DIAGNOSIS — Z86718 Personal history of other venous thrombosis and embolism: Secondary | ICD-10-CM

## 2020-12-07 DIAGNOSIS — I10 Essential (primary) hypertension: Secondary | ICD-10-CM | POA: Diagnosis not present

## 2020-12-07 DIAGNOSIS — E11621 Type 2 diabetes mellitus with foot ulcer: Secondary | ICD-10-CM | POA: Diagnosis not present

## 2020-12-07 DIAGNOSIS — Z7901 Long term (current) use of anticoagulants: Secondary | ICD-10-CM | POA: Insufficient documentation

## 2020-12-07 DIAGNOSIS — E1140 Type 2 diabetes mellitus with diabetic neuropathy, unspecified: Secondary | ICD-10-CM

## 2020-12-07 DIAGNOSIS — Z794 Long term (current) use of insulin: Secondary | ICD-10-CM | POA: Diagnosis not present

## 2020-12-07 DIAGNOSIS — E1142 Type 2 diabetes mellitus with diabetic polyneuropathy: Secondary | ICD-10-CM | POA: Diagnosis not present

## 2020-12-07 DIAGNOSIS — Z8249 Family history of ischemic heart disease and other diseases of the circulatory system: Secondary | ICD-10-CM | POA: Diagnosis not present

## 2020-12-07 DIAGNOSIS — M069 Rheumatoid arthritis, unspecified: Secondary | ICD-10-CM | POA: Diagnosis not present

## 2020-12-07 NOTE — Progress Notes (Signed)
WON, KREUZER ([id number]) Visit Report for 12/07/2020 Chief Complaint Document Details Patient Name: Date of Service: [place], [name] 12/07/2020 7:30 A M Medical Record Number: [id number] Patient Account Number: [id number] Date of Birth/Sex: Treating RN: Aug 11, 1951 (70 y.o. Male) Lorrin Jackson Primary Care Provider: Leanna Battles Other Clinician: Referring Provider: Treating Provider/Extender: Darrold Junker in Treatment: 0 Information Obtained from: Patient Chief Complaint Left great toe wound Electronic Signature(s) Signed: 12/07/2020 11:20:27 AM By: Kalman Shan DO Entered By: Kalman Shan on 12/07/2020 09:15:39 -------------------------------------------------------------------------------- Debridement Details Patient Name: Date of Service: [name], [name] 12/07/2020 7:30 A M Medical Record Number: [id number] Patient Account Number: [id number] Date of Birth/Sex: Treating RN: May 29, 1951 (70 y.o. Male) Rhae Hammock Primary Care Provider: Leanna Battles Other Clinician: Referring Provider: Treating Provider/Extender: Darrold Junker in Treatment: 0 Debridement Performed for Assessment: Wound #1 Left,Plantar T Great oe Performed By: Physician Kalman Shan, Debridement Type: Debridement Severity of Tissue Pre Debridement: Fat layer exposed Level of Consciousness (Pre-procedure): Awake and Alert Pre-procedure Verification/Time Out Yes - 08:40 Taken: Start Time: 08:40 Pain Control: Lidocaine T Area Debrided (L x W): otal 1 (cm) x 1.2 (cm) = 1.2 (cm) Tissue and other material debrided: Viable, Non-Viable, Subcutaneous Level: Skin/Subcutaneous Tissue Debridement Description: Excisional Instrument: Curette Bleeding: Minimum Hemostasis Achieved: Pressure End Time: 08:41 Procedural Pain: 0 Post Procedural Pain: 0 Response to Treatment: Procedure was tolerated well Level of  Consciousness (Post- Awake and Alert procedure): Post Debridement Measurements of Total Wound Length: (cm) 1 Width: (cm) 1.2 Depth: (cm) 0.1 Volume: (cm) 0.094 Character of Wound/Ulcer Post Debridement: Improved Severity of Tissue Post Debridement: Fat layer exposed Post Procedure Diagnosis Same as Pre-procedure Electronic Signature(s) Signed: 12/07/2020 5:30:00 PM By: Rhae Hammock RN Entered By: Rhae Hammock on 12/07/2020 08:41:32 -------------------------------------------------------------------------------- HPI Details Patient Name: Date of Service: [name], [name] 12/07/2020 7:30 A M Medical Record Number: [id number] Patient Account Number: [id number] Date of Birth/Sex: Treating RN: Jul 04, 1951 (70 y.o. 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 He presents to the clinic today for a left great toe wound. He states that he stepped on something sharp that sliced the top part of his left great toe back in December 2021. He has been evaluated by his primary care physician and reports taking 2 rounds of antibiotics. He recently saw podiatry and the toe was debrided and padded. He is not currently doing any dressing changes to the wound. He denies increased warmth or erythema to the foot. He denies fever/chills, purulent drainage or nausea/vomiting. Electronic Signature(s) Signed: 12/07/2020 11:20:27 AM By: Kalman Shan DO Entered By: Kalman Shan on 12/07/2020 09:22:39 -------------------------------------------------------------------------------- Physical Exam Details Patient Name: Date of  Service: [name], [name] 12/07/2020 7:30 A M Medical Record Number: [id number] Patient Account Number: [id number] Date of Birth/Sex: Treating RN: 1950/09/20 (70 y.o. Male) Lorrin Jackson Primary Care Provider: Leanna Battles Other Clinician: Referring Provider: Treating Provider/Extender: Darrold Junker in Treatment: 0 Constitutional respirations regular, non-labored and within target range for patient.. Cardiovascular 2+ dorsalis pedis/posterior tibialis pulses. Notes Left great toe plantar aspect: Necrotic tissue on the tip of the toe with minimal serous drainage coming out of  the edges. No swelling noted to the foot. Foot is warm to the touch. Varicose veins Electronic Signature(s) Signed: 12/07/2020 11:20:27 AM By: Kalman Shan DO Entered By: Kalman Shan on 12/07/2020 09:24:59 -------------------------------------------------------------------------------- Physician Orders Details Patient Name: Date of Service: [name], [name] 12/07/2020 7:30 A M Medical Record Number: [id number] Patient Account Number: [id number] Date of Birth/Sex: Treating RN: 13-May-1951 (70 y.o. Male) Rhae Hammock Primary Care Provider: Leanna Battles Other Clinician: Referring Provider: Treating Provider/Extender: [place] Hard in Treatment: 0 Verbal / Phone Orders: No Diagnosis Coding ICD-10 Coding Code Description E11.621 Type 2 diabetes mellitus with foot ulcer E11.40 Type 2 diabetes mellitus with diabetic neuropathy, unspecified Z86.718 Personal history of other venous thrombosis and embolism I10 Essential (primary) hypertension Follow-up Appointments Return Appointment in 1 week. Bathing/ Shower/ Hygiene May shower with protection but do not get wound dressing(s) wet. Edema Control - Lymphedema / SCD / Other Elevate legs to the level of the heart or above for 30 minutes daily and/or when sitting, a frequency  of: Avoid standing for long periods of time. Wound Treatment Wound #1 - T Great oe Wound Laterality: Plantar, Left Cleanser: Soap and Water Every Other Day/15 Days Discharge Instructions: May shower and wash wound with dial antibacterial soap and water prior to dressing change. Cleanser: Wound Cleanser (DME) (Generic) Every Other Day/15 Days Discharge Instructions: Cleanse the wound with wound cleanser prior to applying a clean dressing using gauze sponges, not tissue or cotton balls. Prim Dressing: Hydrofera Blue Classic Foam, 2x2 in (DME) (Generic) Every Other Day/15 Days ary Discharge Instructions: Moisten with saline prior to applying to wound bed Secondary Dressing: Woven Gauze Sponge, Non-Sterile 4x4 in (DME) (Generic) Every Other Day/15 Days Discharge Instructions: Apply over primary dressing as directed. Secondary Dressing: Optifoam Non-Adhesive Dressing, 4x4 in (DME) (Generic) Every Other Day/15 Days Discharge Instructions: Apply over primary dressing as directed. Secured With: Child psychotherapist, Sterile 2x75 (in/in) (DME) (Generic) Every Other Day/15 Days Discharge Instructions: Secure with stretch gauze as directed. Secured With: 36M Medipore H Soft Cloth Surgical Tape, 2x2 (in/yd) (DME) (Generic) Every Other Day/15 Days Discharge Instructions: Secure dressing with tape as directed. Services and Therapies nkle Brachial Index (ABI) - with TBI's A Electronic Signature(s) Signed: 12/07/2020 4:57:27 PM By: Linton Ham MD Signed: 12/07/2020 5:30:00 PM By: Rhae Hammock RN Previous Signature: 12/07/2020 11:20:27 AM Version By: Kalman Shan DO Entered By: Rhae Hammock on 12/07/2020 16:55:38 -------------------------------------------------------------------------------- Problem List Details Patient Name: Date of Service: [name], [name] 12/07/2020 7:30 A M Medical Record Number: [id number] Patient Account Number: [id number] Date of  Birth/Sex: Treating RN: 1951/03/11 (70 y.o. Male) Lorrin Jackson Primary Care Provider: Leanna Battles Other Clinician: Referring Provider: Treating Provider/Extender: Darrold Junker in Treatment: 0 Active Problems ICD-10 Encounter Code Description Active Date MDM Diagnosis E11.621 Type 2 diabetes mellitus with foot ulcer 12/07/2020 No Yes E11.40 Type 2 diabetes mellitus with diabetic neuropathy, unspecified 12/07/2020 No Yes Z86.718 Personal history of other venous thrombosis and embolism 12/07/2020 No Yes I10 Essential (primary) hypertension 12/07/2020 No Yes Inactive Problems Resolved Problems Electronic Signature(s) Signed: 12/07/2020 11:20:27 AM By: Kalman Shan DO Entered By: Kalman Shan on 12/07/2020 09:14:36 -------------------------------------------------------------------------------- Progress Note Details Patient Name: Date of Service: [name], [name] 12/07/2020 7:30 A M Medical Record Number: [id number] Patient Account Number: [id number] Date of Birth/Sex: Treating RN: 02/19/1951 (70 y.o. 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 He presents to the clinic today for a left great toe wound. He states that he stepped on something sharp that sliced the top part of his left great toe back in December 2021. He has been evaluated by his primary care physician and reports taking 2 rounds of antibiotics. He recently saw podiatry and the toe was debrided and padded. He is not currently doing any  dressing changes to the wound. He denies increased warmth or erythema to the foot. He denies fever/chills, purulent drainage or nausea/vomiting. Patient History Information obtained from Patient. Allergies No Known Allergies Family History Heart Disease, No family history of Cancer, Diabetes, Hereditary Spherocytosis, Hypertension, Kidney Disease, Lung Disease, Seizures, Stroke, Thyroid Problems, Tuberculosis. Social History Never smoker, Marital Status - Married, Alcohol Use - Rarely - prior history, Drug Use - No History, Caffeine Use - Rarely. Medical History Eyes Denies history of Cataracts, Glaucoma, Optic Neuritis Ear/Nose/Mouth/Throat Denies history of Chronic sinus problems/congestion, Middle ear problems Hematologic/Lymphatic Denies history of Anemia, Hemophilia, Human Immunodeficiency Virus, Lymphedema, Sickle Cell Disease Respiratory Denies history of Aspiration, Asthma, Chronic Obstructive Pulmonary Disease (COPD), Pneumothorax, Sleep Apnea, Tuberculosis Cardiovascular Patient has history of Deep Vein Thrombosis - hx DVT Hypertension , Denies history of Angina, Arrhythmia, Congestive Heart Failure, Coronary Artery Disease, Hypotension, Myocardial Infarction, Peripheral Arterial Disease, Peripheral Venous Disease, Phlebitis, Vasculitis Gastrointestinal Denies history of Cirrhosis , Colitis, Crohnoos, Hepatitis A, Hepatitis B, Hepatitis C Endocrine Patient has history of Type II Diabetes Denies history of Type I Diabetes Genitourinary Denies history of End Stage Renal Disease Immunological Denies history of Lupus Erythematosus, Raynaudoos, Scleroderma Integumentary (Skin) Denies history of History of Burn Musculoskeletal Patient has history of Rheumatoid Arthritis Denies history of Gout, Osteoarthritis, Osteomyelitis Neurologic Patient has history of Neuropathy Denies history of Dementia, Quadriplegia, Paraplegia, Seizure Disorder Oncologic Denies history of  Received Chemotherapy, Received Radiation Patient is treated with Insulin, Oral Agents. Blood sugar is not tested. Hospitalization/Surgery History - spine surgery. Medical A Surgical History Notes nd Eyes DM retinopathy Review of Systems (ROS) Constitutional Symptoms (General Health) Denies complaints or symptoms of Fatigue, Fever, Chills, Marked Weight Change. Eyes Complains or has symptoms of Glasses / Contacts. Denies complaints or symptoms of Dry Eyes, Vision Changes. Ear/Nose/Mouth/Throat Denies complaints or symptoms of Chronic sinus problems or rhinitis. Respiratory Denies complaints or symptoms of Chronic or frequent coughs, Shortness of Breath. Cardiovascular Denies complaints or symptoms of Chest pain. Gastrointestinal Denies complaints or symptoms of Frequent diarrhea, Nausea, Vomiting. Endocrine Denies complaints or symptoms of Heat/cold intolerance. Genitourinary Denies complaints or symptoms of Frequent urination. Integumentary (Skin) Complains or has symptoms of Wounds. Musculoskeletal Denies complaints or symptoms of Muscle Pain, Muscle Weakness. Neurologic Denies complaints or symptoms of Numbness/parasthesias. Psychiatric Denies complaints or symptoms of Claustrophobia, Suicidal. Objective Constitutional respirations regular, non-labored and within target range for patient.. Vitals Time Taken: 8:23 AM, Temperature: 97.7 F, Pulse: 67 bpm, Respiratory Rate: 17 breaths/min, Blood Pressure: 157/67 mmHg. Cardiovascular 2+ dorsalis pedis/posterior tibialis pulses. General Notes: Left great toe plantar aspect: Necrotic tissue on the tip of the toe with minimal serous drainage coming out of the edges. No swelling noted to the foot. Foot is warm to the touch. Varicose veins Integumentary (Hair, Skin) Wound #1 status  is Open. Original cause of wound was Trauma. The date acquired was: 08/10/2020. The wound is located on the [employer]. The oe wound  measures 1cm length x 1.2cm width x 0.1cm depth; 0.942cm^2 area and 0.094cm^3 volume. There is no tunneling or undermining noted. There is a medium amount of serosanguineous drainage noted. The wound margin is distinct with the outline attached to the wound base. There is no granulation within the wound bed. There is a large (67-100%) amount of necrotic tissue within the wound bed including Eschar and Adherent Slough. Assessment Active Problems ICD-10 Type 2 diabetes mellitus with foot ulcer Type 2 diabetes mellitus with diabetic neuropathy, unspecified Personal history of other venous thrombosis and embolism Essential (primary) hypertension Procedures Wound #1 Pre-procedure diagnosis of Wound #1 is a Diabetic Wound/Ulcer of the Lower Extremity located on the [place] .Severity of Tissue Pre oe Debridement is: Fat layer exposed. There was a Excisional Skin/Subcutaneous Tissue Debridement with a total area of 1.2 sq cm performed by Kalman Shan. With the following instrument(s): Curette to remove Viable and Non-Viable tissue/material. Material removed includes Subcutaneous Tissue after achieving pain control using Lidocaine. No specimens were taken. A time out was conducted at 08:40, prior to the start of the procedure. A Minimum amount of bleeding was controlled with Pressure. The procedure was tolerated well with a pain level of 0 throughout and a pain level of 0 following the procedure. Post Debridement Measurements: 1cm length x 1.2cm width x 0.1cm depth; 0.094cm^3 volume. Character of Wound/Ulcer Post Debridement is improved. Severity of Tissue Post Debridement is: Fat layer exposed. Post procedure Diagnosis Wound #1: Same as Pre-Procedure Presents today with a chronic nonhealing wound to the tip of his left great toe. This has been present for the past 4 months. He does state that it has improved over time however has not completely closed. ABIs were noncompressible today  and I would like to obtain formal studies with TBI's to assure proper blood flow is present for healing. He does have 2+ pedal pulses. The necrotic tissue on the top was taken off with a curette and showed great granulation tissue underneath. I think he would benefit from a foam dressing. Also the location of the wound looks to be aggravated by foot wear. I recommended he use a surgical shoe so that the top of his foot has pressure relief. There are no signs of infection and since the wound has very little depth to it I do not think we need to obtain imaging at this time. I would like to see him back in 1 week. Plan Follow-up Appointments: Return Appointment in 1 week. Bathing/ Shower/ Hygiene: May shower with protection but do not get wound dressing(s) wet. Edema Control - Lymphedema / SCD / Other: Elevate legs to the level of the heart or above for 30 minutes daily and/or when sitting, a frequency of: Avoid standing for long periods of time. Services and Therapies ordered were: Ankle Brachial Index (ABI) - with TBI's WOUND #1: - T Great Wound Laterality: Plantar, Left oe Cleanser: Soap and Water Every Other Day/15 Days Discharge Instructions: May shower and wash wound with dial antibacterial soap and water prior to dressing change. Cleanser: Wound Cleanser (DME) (Generic) Every Other Day/15 Days Discharge Instructions: Cleanse the wound with wound cleanser prior to applying a clean dressing using gauze sponges, not tissue or cotton balls. Prim Dressing: Hydrofera Blue Classic Foam, 2x2 in (DME) (Generic) Every Other Day/15 Days ary Discharge Instructions: Moisten with  saline prior to applying to wound bed Secondary Dressing: Woven Gauze Sponge, Non-Sterile 4x4 in (DME) (Generic) Every Other Day/15 Days Discharge Instructions: Apply over primary dressing as directed. Secondary Dressing: Optifoam Non-Adhesive Dressing, 4x4 in (DME) (Generic) Every Other Day/15 Days Discharge Instructions:  Apply over primary dressing as directed. Secured With: Child psychotherapist, Sterile 2x75 (in/in) (DME) (Generic) Every Other Day/15 Days Discharge Instructions: Secure with stretch gauze as directed. Secured With: 43M Medipore H Soft Cloth Surgical T ape, 2x2 (in/yd) (DME) (Generic) Every Other Day/15 Days Discharge Instructions: Secure dressing with tape as directed. 1. Surgical shoe 2. Hydrofera Blue every other day. 3. ABIs with TBI's 4. follow up in one week 5. sharp debridement Electronic Signature(s) Signed: 12/07/2020 11:20:27 AM By: Kalman Shan DO Entered By: Kalman Shan on 12/07/2020 09:32:07 -------------------------------------------------------------------------------- HxROS Details Patient Name: Date of Service: [name], [name] 12/07/2020 7:30 A M Medical Record Number: [id number] Patient Account Number: [id number] Date of Birth/Sex: Treating RN: 02-Aug-1951 (70 y.o. Male) Rhae Hammock Primary Care Provider: Leanna Battles Other Clinician: Referring Provider: Treating Provider/Extender: Darrold Junker in Treatment: 0 Information Obtained From Patient Constitutional Symptoms (General Health) Complaints and Symptoms: Negative for: Fatigue; Fever; Chills; Marked Weight Change Eyes Complaints and Symptoms: Positive for: Glasses / Contacts Negative for: Dry Eyes; Vision Changes Medical History: Negative for: Cataracts; Glaucoma; Optic Neuritis Past Medical History Notes: DM retinopathy Ear/Nose/Mouth/Throat Complaints and Symptoms: Negative for: Chronic sinus problems or rhinitis Medical History: Negative for: Chronic sinus problems/congestion; Middle ear problems Respiratory Complaints and Symptoms: Negative for: Chronic or frequent coughs; Shortness of Breath Medical History: Negative for: Aspiration; Asthma; Chronic Obstructive Pulmonary Disease (COPD); Pneumothorax; Sleep Apnea;  Tuberculosis Cardiovascular Complaints and Symptoms: Negative for: Chest pain Medical History: Positive for: Deep Vein Thrombosis - hx DVT Hypertension ; Negative for: Angina; Arrhythmia; Congestive Heart Failure; Coronary Artery Disease; Hypotension; Myocardial Infarction; Peripheral Arterial Disease; Peripheral Venous Disease; Phlebitis; Vasculitis Gastrointestinal Complaints and Symptoms: Negative for: Frequent diarrhea; Nausea; Vomiting Medical History: Negative for: Cirrhosis ; Colitis; Crohns; Hepatitis A; Hepatitis B; Hepatitis C Endocrine Complaints and Symptoms: Negative for: Heat/cold intolerance Medical History: Positive for: Type II Diabetes Negative for: Type I Diabetes Treated with: Insulin, Oral agents Blood sugar tested every day: No Genitourinary Complaints and Symptoms: Negative for: Frequent urination Medical History: Negative for: End Stage Renal Disease Integumentary (Skin) Complaints and Symptoms: Positive for: Wounds Medical History: Negative for: History of Burn Musculoskeletal Complaints and Symptoms: Negative for: Muscle Pain; Muscle Weakness Medical History: Positive for: Rheumatoid Arthritis Negative for: Gout; Osteoarthritis; Osteomyelitis Neurologic Complaints and Symptoms: Negative for: Numbness/parasthesias Medical History: Positive for: Neuropathy Negative for: Dementia; Quadriplegia; Paraplegia; Seizure Disorder Psychiatric Complaints and Symptoms: Negative for: Claustrophobia; Suicidal Hematologic/Lymphatic Medical History: Negative for: Anemia; Hemophilia; Human Immunodeficiency Virus; Lymphedema; Sickle Cell Disease Immunological Medical History: Negative for: Lupus Erythematosus; Raynauds; Scleroderma Oncologic Medical History: Negative for: Received Chemotherapy; Received Radiation Immunizations Pneumococcal Vaccine: Received Pneumococcal Vaccination: No Implantable Devices None Hospitalization / Surgery History Type  of Hospitalization/Surgery spine surgery Family and Social History Cancer: No; Diabetes: No; Heart Disease: Yes; Hereditary Spherocytosis: No; Hypertension: No; Kidney Disease: No; Lung Disease: No; Seizures: No; Stroke: No; Thyroid Problems: No; Tuberculosis: No; Never smoker; Marital Status - Married; Alcohol Use: Rarely - prior history; Drug Use: No History; Caffeine Use: Rarely; Financial Concerns: No; Food, Clothing or Shelter Needs: No; Support System Lacking: No; Transportation Concerns: No Engineer, maintenance) Signed: 12/07/2020 5:30:00 PM By: Rhae Hammock RN Entered By: Rhae Hammock  on 12/07/2020 08:04:07 -------------------------------------------------------------------------------- SuperBill Details Patient Name: Date of Service: [place], [name] 12/07/2020 Medical Record Number: [id number] Patient Account Number: [id number] Date of Birth/Sex: Treating RN: December 19, 1950 (70 y.o. Male) Lorrin Jackson Primary Care Provider: Leanna Battles Other Clinician: Referring Provider: Treating Provider/Extender: Darrold Junker in Treatment: 0 Diagnosis Coding ICD-10 Codes Code Description [phone] Type 2 diabetes mellitus with foot ulcer E11.40 Type 2 diabetes mellitus with diabetic neuropathy, unspecified Z86.718 Personal history of other venous thrombosis and embolism I10 Essential (primary) hypertension Facility Procedures CPT4 Code: [id number] Description: [facility] VISIT-LEV 3 EST PT Modifier: Quantity: 1 CPT4 Code: [id number] Description: 66440 - DEB SUBQ TISSUE 20 SQ CM/< ICD-10 Diagnosis Description E11.621 Type 2 diabetes mellitus with foot ulcer Modifier: Quantity: 1 Physician Procedures : CPT4 Code Description Modifier [id number] - WC PHYS SUBQ TISS 20 SQ CM ICD-10 Diagnosis Description E11.621 Type 2 diabetes mellitus with foot ulcer Quantity: 1 Electronic Signature(s) Signed: 12/07/2020 5:30:00 PM By: Rhae Hammock RN Previous Signature: 12/07/2020 11:20:27 AM Version By: Kalman Shan DO Entered By: Rhae Hammock on 12/07/2020 16:43:06

## 2020-12-07 NOTE — Progress Notes (Addendum)
Anthony Case ([id number]) Visit Report for 12/07/2020 Allergy List Details Patient Name: Date of Service: [name], [name] 12/07/2020 7:30 A M Medical Record Number: [id number] Patient Account Number: [id number] Date of Birth/Sex: Treating RN: 1951-07-01 (70 y.o. Male) Rhae Hammock Primary Care Anthony Case Other Clinician: Referring Anthony Case: Treating Anthony Case/Extender: Anthony Case in Treatment: 0 Allergies Active Allergies No Known Allergies Allergy Notes Electronic Signature(s) Signed: 12/07/2020 5:30:00 PM By: Rhae Hammock RN Entered By: Rhae Hammock on 12/07/2020 08:23:01 -------------------------------------------------------------------------------- Arrival Information Details Patient Name: Date of Service: Anthony Case, [name] 12/07/2020 7:30 A M Medical Record Number: [id number] Patient Account Number: [id number] Date of Birth/Sex: Treating RN: 16-Apr-1951 (70 y.o. Male) Rhae Hammock Primary Care Karsen Nakanishi: Anthony Case Other Clinician: Referring Lovena Kluck: Treating Damacio Weisgerber/Extender: Anthony Case in Treatment: 0 Visit Information Patient Arrived: Ambulatory Arrival Time: 07:57 Accompanied By: self Transfer Assistance: None Patient Identification Verified: Yes Secondary Verification Process Completed: Yes Patient Requires Transmission-Based Precautions: No Patient Has Alerts: No Electronic Signature(s) Signed: 12/07/2020 5:30:00 PM By: Rhae Hammock RN Entered By: Rhae Hammock on 12/07/2020 07:58:04 -------------------------------------------------------------------------------- Clinic Level of Care Assessment Details Patient Name: Date of Service: Anthony [name], [name] 12/07/2020 7:30 A M Medical Record Number: [id number] Patient Account Number: [id number] Date of Birth/Sex: Treating RN: Sep 27, 1950 (70 y.o. Male) Rhae Hammock Primary Care  Trevor Wilkie: Anthony Case Other Clinician: Referring Andrius Andrepont: Treating Ysidra Sopher/Extender: Anthony Case in Treatment: 0 Clinic Level of Care Assessment Items TOOL 1 Quantity Score X- 1 0 Use when EandM and Procedure is performed on INITIAL visit ASSESSMENTS - Nursing Assessment / Reassessment X- 1 20 General Physical Exam (combine w/ comprehensive assessment (listed just below) when performed on new pt. evals) X- 1 25 Comprehensive Assessment (HX, ROS, Risk Assessments, Wounds Hx, etc.) ASSESSMENTS - Wound and Skin Assessment / Reassessment X- 1 10 Dermatologic / Skin Assessment (not related to wound area) ASSESSMENTS - Ostomy and/or Continence Assessment and Care []  - 0 Incontinence Assessment and Management []  - 0 Ostomy Care Assessment and Management (repouching, etc.) PROCESS - Coordination of Care X - Simple Patient / Family Education for ongoing care 1 15 []  - 0 Complex (extensive) Patient / Family Education for ongoing care X- 1 10 Staff obtains Programmer, systems, Records, T Results / Process Orders est []  - 0 Staff telephones HHA, Nursing Homes / Clarify orders / etc []  - 0 Routine Transfer to another Facility (non-emergent condition) []  - 0 Routine Hospital Admission (non-emergent condition) X- 1 15 New Admissions / Biomedical engineer / Ordering NPWT Apligraf, etc. , []  - 0 Emergency Hospital Admission (emergent condition) PROCESS - Special Needs []  - 0 Pediatric / Minor Patient Management []  - 0 Isolation Patient Management []  - 0 Hearing / Language / Visual special needs []  - 0 Assessment of Community assistance (transportation, D/C planning, etc.) []  - 0 Additional assistance / Altered mentation []  - 0 Support Surface(s) Assessment (bed, cushion, seat, etc.) INTERVENTIONS - Miscellaneous []  - 0 External ear exam []  - 0 Patient Transfer (multiple staff / Civil Service fast streamer / Similar devices) []  - 0 Simple Staple / Suture removal  (25 or less) []  - 0 Complex Staple / Suture removal (26 or more) []  - 0 Hypo/Hyperglycemic Management (do not check if billed separately) X- 1 15 Ankle / Brachial Index (ABI) - do not check if billed separately Has the patient been seen at the hospital within the last three years: Yes Total Score:  110 Level Of Care: New/Established - Level 3 Electronic Signature(s) Signed: 12/07/2020 5:30:00 PM By: Rhae Hammock RN Entered By: Rhae Hammock on 12/07/2020 08:42:14 -------------------------------------------------------------------------------- Lower Extremity Assessment Details Patient Name: Date of Service: [name], [name] 12/07/2020 7:30 A M Medical Record Number: [id number] Patient Account Number: [id number] Date of Birth/Sex: Treating RN: [birth year] (70 y.o. Male) Rhae Hammock Primary Care Elridge Stemm: Anthony Case Other Clinician: Referring Bethany Hirt: Treating Loyalty Arentz/Extender: Anthony Case in Treatment: 0 Edema Assessment Assessed: Shirlyn Goltz: Yes] Patrice Paradise: No] Edema: [Left: N] [Right: o] Calf Left: Right: Point of Measurement: 34 cm From Medial Instep 40 cm Ankle Left: Right: Point of Measurement: 12 cm From Medial Instep 24 cm Vascular Assessment Pulses: Dorsalis Pedis Palpable: [Left:Yes] Posterior Tibial Palpable: [Left:Yes] Notes Non compressible Electronic Signature(s) Signed: 12/07/2020 5:30:00 PM By: Rhae Hammock RN Entered By: Rhae Hammock on 12/07/2020 08:09:02 -------------------------------------------------------------------------------- Multi Wound Chart Details Patient Name: Date of Service: Anthony Case, [name] 12/07/2020 7:30 A M Medical Record Number: [id number] Patient Account Number: [id number] Date of Birth/Sex: Treating RN: February 08, 1951 (70 y.o. Male) Lorrin Jackson Primary Care Emilea Goga: Anthony Case Other Clinician: Referring Amely Voorheis: Treating Abbeygail Igoe/Extender: Anthony Case in Treatment: 0 Vital Signs Height(in): Pulse(bpm): 44 Weight(lbs): Blood Pressure(mmHg): 157/67 Body Mass Index(BMI): Temperature(F): 97.7 Respiratory Rate(breaths/min): 17 Photos: [1:No Photos Left, Plantar T Great oe] [N/A:N/A N/A] Wound Location: [1:Trauma] [N/A:N/A] Wounding Event: [1:Diabetic Wound/Ulcer of the Lower] [N/A:N/A] Primary Etiology: [1:Extremity Deep Vein Thrombosis, Hypertension, N/A] Comorbid History: [1:Type II Diabetes, Rheumatoid Arthritis, Neuropathy 08/10/2020] [N/A:N/A] Date Acquired: [1:0] [N/A:N/A] Weeks of Treatment: [1:Open] [N/A:N/A] Wound Status: [1:1x1.2x0.1] [N/A:N/A] Measurements L x W x D (cm) [1:0.942] [N/A:N/A] A (cm) : rea [1:0.094] [N/A:N/A] Volume (cm) : [1:Grade 2] [N/A:N/A] Classification: [1:Medium] [N/A:N/A] Exudate A mount: [1:Serosanguineous] [N/A:N/A] Exudate Type: [1:red, brown] [N/A:N/A] Exudate Color: [1:Distinct, outline attached] [N/A:N/A] Wound Margin: [1:None Present (0%)] [N/A:N/A] Granulation A mount: [1:Large (67-100%)] [N/A:N/A] Necrotic A mount: [1:Eschar, Adherent Slough] [N/A:N/A] Necrotic Tissue: [1:Fascia: No] [N/A:N/A] Exposed Structures: [1:Fat Layer (Subcutaneous Tissue): No Tendon: No Muscle: No Joint: No Bone: No None] [N/A:N/A] Epithelialization: [1:Debridement - Excisional] [N/A:N/A] Debridement: Pre-procedure Verification/Time Out 08:40 [N/A:N/A] Taken: [1:Lidocaine] [N/A:N/A] Pain Control: [1:Subcutaneous] [N/A:N/A] Tissue Debrided: [1:Skin/Subcutaneous Tissue] [N/A:N/A] Level: [1:1.2] [N/A:N/A] Debridement A (sq cm): [1:rea Curette] [N/A:N/A] Instrument: [1:Minimum] [N/A:N/A] Bleeding: [1:Pressure] [N/A:N/A] Hemostasis A chieved: [1:0] [N/A:N/A] Procedural Pain: [1:0] [N/A:N/A] Post Procedural Pain: [1:Procedure was tolerated well] [N/A:N/A] Debridement Treatment Response: [1:1x1.2x0.1] [N/A:N/A] Post Debridement Measurements L x W x D (cm) [1:0.094]  [N/A:N/A] Post Debridement Volume: (cm) [1:Debridement] [N/A:N/A] Treatment Notes Electronic Signature(s) Signed: 12/07/2020 11:20:27 AM By: Kalman Shan DO Signed: 12/07/2020 5:21:53 PM By: Lorrin Jackson Entered By: Kalman Shan on 12/07/2020 09:15:21 -------------------------------------------------------------------------------- Multi-Disciplinary Care Plan Details Patient Name: Date of Service: Anthony Case, [name] 12/07/2020 7:30 A M Medical Record Number: [id number] Patient Account Number: [id number] Date of Birth/Sex: Treating RN: Jan 17, 1951 (70 y.o. Male) Lorrin Jackson Primary Care Analyssa Downs: Anthony Case Other Clinician: Referring Oseas Detty: Treating Bosco Paparella/Extender: Anthony Case in Treatment: 0 Active Inactive Nutrition Nursing Diagnoses: Impaired glucose control: actual or potential Goals: Patient/caregiver verbalizes understanding of need to maintain therapeutic glucose control per primary care physician Date Initiated: 12/07/2020 Target Resolution Date: 01/18/2021 Goal Status: Active Interventions: Assess HgA1c results as ordered upon admission and as needed Assess patient nutrition upon admission and as needed per policy Provide education on elevated blood sugars and impact on wound healing Treatment Activities: Obtain HgA1c : 12/07/2020 Notes:  Orientation to the Wound Care Program Nursing Diagnoses: Knowledge deficit related to the wound healing center program Goals: Patient/caregiver will verbalize understanding of the [facility] Program Date Initiated: 12/07/2020 Target Resolution Date: 01/04/2021 Goal Status: Active Interventions: Provide education on orientation to the wound center Notes: Wound/Skin Impairment Nursing Diagnoses: Impaired tissue integrity Goals: Patient/caregiver will verbalize understanding of skin care regimen Date Initiated: 12/07/2020 Target Resolution Date: 01/04/2021 Goal  Status: Active Ulcer/skin breakdown will have a volume reduction of 30% by week 4 Date Initiated: 12/07/2020 Target Resolution Date: 01/04/2021 Goal Status: Active Interventions: Assess patient/caregiver ability to obtain necessary supplies Assess patient/caregiver ability to perform ulcer/skin care regimen upon admission and as needed Assess ulceration(s) every visit Provide education on ulcer and skin care Treatment Activities: Skin care regimen initiated : 12/07/2020 Topical wound management initiated : 12/07/2020 Notes: Electronic Signature(s) Signed: 12/07/2020 7:57:20 AM By: Lorrin Jackson Entered By: Lorrin Jackson on 12/07/2020 07:57:19 -------------------------------------------------------------------------------- Pain Assessment Details Patient Name: Date of Service: Anthony Case, [name] 12/07/2020 7:30 A M Medical Record Number: [id number] Patient Account Number: [id number] Date of Birth/Sex: Treating RN: 1951/02/21 (70 y.o. Male) Rhae Hammock Primary Care Gaddiel Cullens: Anthony Case Other Clinician: Referring Landry Kamath: Treating Sitlaly Gudiel/Extender: Anthony Case in Treatment: 0 Active Problems Location of Pain Severity and Description of Pain Patient Has Paino No Patient Has Paino No Site Locations Pain Management and Medication Current Pain Management: Electronic Signature(s) Signed: 12/07/2020 5:30:00 PM By: Rhae Hammock RN Entered By: Rhae Hammock on 12/07/2020 08:06:23 -------------------------------------------------------------------------------- Patient/Caregiver Education Details Patient Name: Date of Service: Monte[place], [name] 4/15/2022andnbsp7:30 A M Medical Record Number: [id number] Patient Account Number: [id number] Date of Birth/Gender: Treating RN: February 11, 1951 (70 y.o. Male) Lorrin Jackson Primary Care Physician: Anthony Case Other Clinician: Referring Physician: Treating Physician/Extender:  Anthony Case in Treatment: 0 Education Assessment Education Provided To: Patient Education Topics Provided Elevated Blood Sugar/ Impact on Healing: Methods: Explain/Verbal, Printed Responses: State content correctly [place]: o Handouts: Welcome T [facility] [place] o Methods: Explain/Verbal, Printed Responses: State content correctly Wound/Skin Impairment: Methods: Demonstration, Explain/Verbal, Printed Responses: State content correctly Electronic Signature(s) Signed: 12/07/2020 5:21:53 PM By: Lorrin Jackson Entered By: Lorrin Jackson on 12/07/2020 07:57:45 -------------------------------------------------------------------------------- Wound Assessment Details Patient Name: Date of Service: Anthony Case, [name] 12/07/2020 7:30 A M Medical Record Number: [id number] Patient Account Number: [id number] Date of Birth/Sex: Treating RN: 1950-09-16 (70 y.o. Male) Rhae Hammock Primary Care Fleeta Kunde: Anthony Case Other Clinician: Referring Landers Prajapati: Treating Umi Mainor/Extender: Anthony Case in Treatment: 0 Wound Status Wound Number: 1 Primary Diabetic Wound/Ulcer of the Lower Extremity Etiology: Wound Location: Left, Plantar T Great oe Wound Open Wounding Event: Trauma Status: Date Acquired: 08/10/2020 Comorbid Deep Vein Thrombosis, Hypertension, Type II Diabetes, Weeks Of Treatment: 0 History: Rheumatoid Arthritis, Neuropathy Clustered Wound: No Photos Wound Measurements Length: (cm) 1 Width: (cm) 1.2 Depth: (cm) 0.1 Area: (cm) 0.942 Volume: (cm) 0.094 % Reduction in Area: 0% % Reduction in Volume: 0% Epithelialization: None Tunneling: No Undermining: No Wound Description Classification: Grade 2 Wound Margin: Distinct, outline attached Exudate Amount: Medium Exudate Type: Serosanguineous Exudate Color: red, brown Foul Odor After Cleansing: No Slough/Fibrino  Yes Wound Bed Granulation Amount: None Present (0%) Exposed Structure Necrotic Amount: Large (67-100%) Fascia Exposed: No Necrotic Quality: Eschar, Adherent Slough Fat Layer (Subcutaneous Tissue) Exposed: No Tendon Exposed: No Muscle Exposed: No Joint Exposed: No Bone Exposed: No Electronic Signature(s) Signed: 12/07/2020 5:30:00 PM  By: Rhae Hammock RN Signed: 12/11/2020 8:07:32 AM By: Sandre Kitty Entered By: Sandre Kitty on 12/07/2020 16:31:26 -------------------------------------------------------------------------------- Vitals Details Patient Name: Date of Service: Anthony Case, [name] 12/07/2020 7:30 A M Medical Record Number: [id number] Patient Account Number: [id number] Date of Birth/Sex: Treating RN: 1951-07-31 (70 y.o. Male) Rhae Hammock Primary Care Kendal Ghazarian: Anthony Case Other Clinician: Referring Libni Fusaro: Treating Allene Furuya/Extender: Anthony Case in Treatment: 0 Vital Signs Time Taken: 08:23 Temperature (F): 97.7 Pulse (bpm): 67 Respiratory Rate (breaths/min): 17 Blood Pressure (mmHg): 157/67 Reference Range: 80 - 120 mg / dl Electronic Signature(s) Signed: 12/07/2020 5:30:00 PM By: Rhae Hammock RN Entered By: Rhae Hammock on 12/07/2020 37:36:68

## 2020-12-07 NOTE — Progress Notes (Signed)
CORDON, GASSETT ([id number]) Visit Report for 12/07/2020 Abuse/Suicide Risk Screen Details Patient Name: Date of Service: [place], [name] 12/07/2020 7:30 A M Medical Record Number: [id number] Patient Account Number: [id number] Date of Birth/Sex: Treating RN: 08-26-1950 (70 y.o. Male) Rhae Hammock Primary Care Hyden Soley: Leanna Battles Other Clinician: Referring Linsi Humann: Treating Jalon Squier/Extender: Darrold Junker in Treatment: 0 Abuse/Suicide Risk Screen Items Answer ABUSE RISK SCREEN: Has anyone close to you tried to hurt or harm you recentlyo No Do you feel uncomfortable with anyone in your familyo No Has anyone forced you do things that you didnt want to doo No Electronic Signature(s) Signed: 12/07/2020 5:30:00 PM By: Rhae Hammock RN Entered By: Rhae Hammock on 12/07/2020 08:04:16 -------------------------------------------------------------------------------- Activities of Daily Living Details Patient Name: Date of Service: [place], [name] 12/07/2020 7:30 A M Medical Record Number: [id number] Patient Account Number: [id number] Date of Birth/Sex: Treating RN: 1950-09-03 (70 y.o. Male) Rhae Hammock Primary Care Sabriya Yono: Leanna Battles Other Clinician: Referring Zahriah Roes: Treating Deriona Altemose/Extender: Darrold Junker in Treatment: 0 Activities of Daily Living Items Answer Activities of Daily Living (Please select one for each item) Drive Automobile Completely Able T Medications ake Completely Able Use T elephone Completely Able Care for Appearance Completely Able Use T oilet Completely Able Bath / Shower Completely Able Dress Self Completely Able Feed Self Completely Able Walk Completely Able Get In / Out Bed Completely Able Housework Completely Able Prepare Meals Completely [place] for Self Completely Able Electronic Signature(s) Signed: 12/07/2020  5:30:00 PM By: Rhae Hammock RN Entered By: Rhae Hammock on 12/07/2020 08:04:35 -------------------------------------------------------------------------------- Education Screening Details Patient Name: Date of Service: [name], [name] 12/07/2020 7:30 A M Medical Record Number: [id number] Patient Account Number: [id number] Date of Birth/Sex: Treating RN: 1951-07-25 (70 y.o. Male) Rhae Hammock Primary Care Taten Merrow: Leanna Battles Other Clinician: Referring Quamaine Webb: Treating Vickey Ewbank/Extender: Darrold Junker in Treatment: 0 Primary Learner Assessed: Patient Learning Preferences/Education Level/Primary Language Learning Preference: Explanation, Demonstration, Communication Board, Printed Material Highest Education Level: College or Above Preferred Language: English Cognitive Barrier Language Barrier: No Translator Needed: No Memory Deficit: No Emotional Barrier: No Cultural/Religious Beliefs Affecting Medical Care: No Physical Barrier Impaired Vision: Yes Glasses Impaired Hearing: No Decreased Hand dexterity: No Knowledge/Comprehension Knowledge Level: High Comprehension Level: High Ability to understand written instructions: High Ability to understand verbal instructions: High Motivation Anxiety Level: Calm Cooperation: Cooperative Education Importance: Denies Need Interest in Health Problems: Asks Questions Perception: Coherent Willingness to Engage in Self-Management High Activities: Readiness to Engage in Self-Management High Activities: Electronic Signature(s) Signed: 12/07/2020 5:30:00 PM By: Rhae Hammock RN Entered By: Rhae Hammock on 12/07/2020 08:04:59 -------------------------------------------------------------------------------- Fall Risk Assessment Details Patient Name: Date of Service: [name], [name] 12/07/2020 7:30 A M Medical Record Number: [id number] Patient Account Number:  [id number] Date of Birth/Sex: Treating RN: Feb 25, 1951 (70 y.o. Male) Rhae Hammock Primary Care Clotee Schlicker: Leanna Battles Other Clinician: Referring Donat Humble: Treating Narciso Stoutenburg/Extender: Darrold Junker in Treatment: 0 Fall Risk Assessment Items Have you had 2 or more falls in the last 12 monthso 0 No Have you had any fall that resulted in injury in the last 12 monthso 0 No FALLS RISK SCREEN History of falling - immediate or within 3 months 0 No Secondary diagnosis (Do you have 2 or more medical diagnoseso) 0 No Ambulatory aid None/bed rest/wheelchair/nurse 0 No Crutches/cane/walker 0 No Furniture 0 No Intravenous therapy Access/Saline/Heparin [employer]  0 No Gait/Transferring Normal/ bed rest/ wheelchair 0 No Weak (short steps with or without shuffle, stooped but able to lift head while walking, may seek 0 No support from furniture) Impaired (short steps with shuffle, may have difficulty arising from chair, head down, impaired 0 No balance) Mental Status Oriented to own ability 0 No Electronic Signature(s) Signed: 12/07/2020 5:30:00 PM By: Rhae Hammock RN Entered By: Rhae Hammock on 12/07/2020 08:05:50 -------------------------------------------------------------------------------- Foot Assessment Details Patient Name: Date of Service: [name], [name] 12/07/2020 7:30 A M Medical Record Number: [id number] Patient Account Number: [id number] Date of Birth/Sex: Treating RN: May 27, 1951 (70 y.o. Male) Rhae Hammock Primary Care Dalene Robards: Leanna Battles Other Clinician: Referring Jazaria Jarecki: Treating Renny Gunnarson/Extender: Darrold Junker in Treatment: 0 Foot Assessment Items Site Locations + = Sensation present, - = Sensation absent, C = Callus, U = Ulcer R = Redness, W = Warmth, M = Maceration, PU = Pre-ulcerative lesion F = Fissure, S = Swelling, D = Dryness Assessment Right: Left: Other Deformity: No  No Prior Foot Ulcer: No No Prior Amputation: No No Charcot Joint: No No Ambulatory Status: Ambulatory Without Help Gait: Steady Electronic Signature(s) Signed: 12/07/2020 5:30:00 PM By: Rhae Hammock RN Entered By: Rhae Hammock on 12/07/2020 08:07:26 -------------------------------------------------------------------------------- Nutrition Risk Screening Details Patient Name: Date of Service: [name], [name] 12/07/2020 7:30 A M Medical Record Number: [id number] Patient Account Number: [id number] Date of Birth/Sex: Treating RN: 1951/07/14 (70 y.o. Male) Rhae Hammock Primary Care Jonelle Bann: Leanna Battles Other Clinician: Referring Saisha Hogue: Treating Thelia Tanksley/Extender: Darrold Junker in Treatment: 0 Height (in): Weight (lbs): Body Mass Index (BMI): Nutrition Risk Screening Items Score Screening NUTRITION RISK SCREEN: I have an illness or condition that made me change the kind and/or amount of food I eat 0 No I eat fewer than two meals per day 0 No I eat few fruits and vegetables, or milk products 0 No I have three or more drinks of beer, liquor or wine almost every day 0 No I have tooth or mouth problems that make it hard for me to eat 0 No I don't always have enough money to buy the food I need 0 No I eat alone most of the time 0 No I take three or more different prescribed or over-the-counter drugs a day 0 No Without wanting to, I have lost or gained 10 pounds in the last six months 0 No I am not always physically able to shop, cook and/or feed myself 0 No Nutrition Protocols Good Risk Protocol 0 No interventions needed Moderate Risk Protocol High Risk Proctocol Risk Level: Good Risk Score: 0 Electronic Signature(s) Signed: 12/07/2020 5:30:00 PM By: Rhae Hammock RN Entered By: Rhae Hammock on 12/07/2020 08:05:58

## 2020-12-11 ENCOUNTER — Other Ambulatory Visit (HOSPITAL_COMMUNITY): Payer: Self-pay | Admitting: Family Medicine

## 2020-12-11 ENCOUNTER — Other Ambulatory Visit (HOSPITAL_COMMUNITY): Payer: Self-pay | Admitting: Internal Medicine

## 2020-12-11 DIAGNOSIS — L97509 Non-pressure chronic ulcer of other part of unspecified foot with unspecified severity: Secondary | ICD-10-CM

## 2020-12-11 DIAGNOSIS — E11621 Type 2 diabetes mellitus with foot ulcer: Secondary | ICD-10-CM

## 2020-12-12 ENCOUNTER — Encounter: Payer: Self-pay | Admitting: Podiatry

## 2020-12-12 ENCOUNTER — Other Ambulatory Visit: Payer: Self-pay

## 2020-12-12 ENCOUNTER — Ambulatory Visit (HOSPITAL_COMMUNITY)
Admission: RE | Admit: 2020-12-12 | Discharge: 2020-12-12 | Disposition: A | Discharge status: Home / Self Care | Payer: Medicare Other | Source: Ambulatory Visit | Attending: Internal Medicine | Admitting: Internal Medicine

## 2020-12-12 ENCOUNTER — Ambulatory Visit (INDEPENDENT_AMBULATORY_CARE_PROVIDER_SITE_OTHER): Payer: Medicare Other | Admitting: Podiatry

## 2020-12-12 DIAGNOSIS — E1169 Type 2 diabetes mellitus with other specified complication: Secondary | ICD-10-CM | POA: Diagnosis not present

## 2020-12-12 DIAGNOSIS — E11621 Type 2 diabetes mellitus with foot ulcer: Secondary | ICD-10-CM

## 2020-12-12 DIAGNOSIS — M869 Osteomyelitis, unspecified: Secondary | ICD-10-CM | POA: Insufficient documentation

## 2020-12-12 DIAGNOSIS — E1142 Type 2 diabetes mellitus with diabetic polyneuropathy: Secondary | ICD-10-CM

## 2020-12-12 DIAGNOSIS — L97509 Non-pressure chronic ulcer of other part of unspecified foot with unspecified severity: Secondary | ICD-10-CM | POA: Diagnosis not present

## 2020-12-12 DIAGNOSIS — I999 Unspecified disorder of circulatory system: Secondary | ICD-10-CM

## 2020-12-12 NOTE — Progress Notes (Signed)
Subjective:  Patient ID: Anthony Case, male    DOB: [birth year],  MRN: [id number]  Chief Complaint  Patient presents with  . Wound Check    F/U Lt hallux ulcer check -pt was seen at wound center last Friday and has appt tomorrow -per pt it looks better -w/ slight redness -no swelling/drainage    70 y.o. male presents for wound care. Patient presents with complaint of left hallux distal tip ulceration.  Patient is being managed primary to the wound care center for this wound.  He wanted to get it evaluated for possible graft application as well as other treatment modalities.  He states that there is no swelling or drainage mild redness associated with it.  He denies any other acute complaints.  He also had a recent ABIs PVRs done which shows vascular flow compromise.  He does not know any vascular surgery doctor   Review of Systems: Negative except as noted in the HPI. Denies N/V/F/Ch.  Past Medical History:  Diagnosis Date  . Arthritis   . Diabetes mellitus   . Hx pulmonary embolism 2009   required emergent tPA treatment  . Hypertension   . Shortness of breath   . Sleep apnea     Current Outpatient Medications:  .  ACETAMINOPHEN PO, Take by mouth. 2 tabs qd, Disp: , Rfl:  .  atorvastatin (LIPITOR) 20 MG tablet, Take 20 mg by mouth daily., Disp: , Rfl:  .  dapagliflozin propanediol (FARXIGA) 10 MG TABS tablet, Take 10 mg by mouth daily., Disp: , Rfl:  .  furosemide (LASIX) 20 MG tablet, Take 20 mg by mouth as needed., Disp: , Rfl:  .  glipiZIDE (GLUCOTROL XL) 2.5 MG 24 hr tablet, 2 (two) times daily. , Disp: , Rfl:  .  HYDROcodone-acetaminophen (NORCO/VICODIN) 5-325 MG tablet, SMARTSIG:1 Tablet(s) By Mouth Every 12 Hours PRN, Disp: , Rfl:  .  LEVEMIR FLEXTOUCH 100 UNIT/ML FlexPen, SMARTSIG:60 Unit(s) SUB-Q Daily, Disp: , Rfl:  .  losartan (COZAAR) 50 MG tablet, Take 50 mg by mouth daily., Disp: , Rfl:  .  metFORMIN (GLUCOPHAGE) 500 MG tablet, Take 1,000 mg by mouth daily with  breakfast., Disp: , Rfl:  .  metoprolol succinate (TOPROL-XL) 25 MG 24 hr tablet, Take 25 mg by mouth daily., Disp: , Rfl:  .  NON FORMULARY, Trimix injection, Disp: , Rfl:  .  Rivaroxaban (XARELTO) 20 MG TABS tablet, Take 20 mg by mouth daily. 08/04/13 last dose of xarelto in preparation for colonoscopy, Disp: , Rfl:  .  Vitamin D, Ergocalciferol, (DRISDOL) 1.25 MG (50000 UT) CAPS capsule, Take 50,000 Units by mouth once a week., Disp: , Rfl:   Social History   Tobacco Use  Smoking Status Never Smoker  Smokeless Tobacco Never Used    Allergies  Allergen Reactions  . Lisinopril Cough   Objective:  There were no vitals filed for this visit. There is no height or weight on file to calculate BMI. Constitutional Well developed. Well nourished.  Vascular Dorsalis pedis pulses none palpable bilaterally. Posterior tibial pulses none palpable bilaterally. Capillary refill diminished to all digits.  No cyanosis or clubbing noted. Pedal hair growth normal.  Neurologic Normal speech. Oriented to person, place, and time. Protective sensation absent  Dermatologic Wound Location: Left hallux ulceration Wound Base: Mixed Granular/Fibrotic Peri-wound: Callused Exudate: Scant/small amount Serous exudate Wound Measurements: -See below  Orthopedic: No pain to palpation either foot.   Radiographs: None Assessment:   1. Diabetic polyneuropathy associated with type 2 diabetes  mellitus ([place])   2. 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 Vascular abnormality    Plan:  Patient was evaluated and treated and all questions answered.  Ulcer left hallux ulceration with fat layer exposed -Continue wearing surgical shoe -The wound was measuring 1 cm x 1 cm x 0.3 cm.  I will hold off on debridement of the wound and primarily managing as patient is being primarily managed at the wound care center.  I will defer further recommendation for the wound. -ABIs PVRs/vascular flow exams were thoroughly  discussed with the patient given that there may be a compromise to the flow I believe he will benefit from following with a specialist.  He will be referred to Dr. Corrie Mckusick.  He states understanding would like to open of the arteries to allow the ulceration to heal adequately. -Patient is a high risk of losing the digit if the ulcer worsens.  I discussed with this with the patient in extensive detail he states understanding.  If any clinical signs of infection go to the emergency room right away. -He will also be going on a cruise trip and I would not want to be doing an office graft application until he returns from the cruise trip.  We will discuss it at that time if the wound has not resolved  Procedure: Excisional Debridement of Wound Tool: Sharp chisel blade/tissue nipper Rationale: Removal of non-viable soft tissue from the wound to promote healing.  Anesthesia: none Pre-Debridement Wound Measurements: 1 cm x 1 cm x 0.3 cm  Post-Debridement Wound Measurements: 1.1 cm x 1 cm x 0.3 cm  Type of Debridement: Sharp Excisional Tissue Removed: Non-viable soft tissue Blood loss: Minimal (<50cc) Depth of Debridement: subcutaneous tissue. Technique: Sharp excisional debridement to bleeding, viable wound base.  Wound Progress: This is my initial evaluation I will continue to monitor the progression of the wound. Dressing: Dry, sterile, compression dressing. Disposition: Patient tolerated procedure well. Patient to return in 1 week for follow-up.  No follow-ups on file.

## 2020-12-13 ENCOUNTER — Encounter (HOSPITAL_BASED_OUTPATIENT_CLINIC_OR_DEPARTMENT_OTHER): Payer: Medicare Other | Admitting: Internal Medicine

## 2020-12-13 DIAGNOSIS — Z794 Long term (current) use of insulin: Secondary | ICD-10-CM | POA: Diagnosis not present

## 2020-12-13 DIAGNOSIS — E1142 Type 2 diabetes mellitus with diabetic polyneuropathy: Secondary | ICD-10-CM | POA: Diagnosis not present

## 2020-12-13 DIAGNOSIS — Z86718 Personal history of other venous thrombosis and embolism: Secondary | ICD-10-CM

## 2020-12-13 DIAGNOSIS — E1140 Type 2 diabetes mellitus with diabetic neuropathy, unspecified: Secondary | ICD-10-CM

## 2020-12-13 DIAGNOSIS — E11621 Type 2 diabetes mellitus with foot ulcer: Secondary | ICD-10-CM

## 2020-12-13 DIAGNOSIS — Z7901 Long term (current) use of anticoagulants: Secondary | ICD-10-CM | POA: Diagnosis not present

## 2020-12-13 DIAGNOSIS — M069 Rheumatoid arthritis, unspecified: Secondary | ICD-10-CM | POA: Diagnosis not present

## 2020-12-13 DIAGNOSIS — I10 Essential (primary) hypertension: Secondary | ICD-10-CM | POA: Diagnosis not present

## 2020-12-13 NOTE — Progress Notes (Signed)
SANDON, YOHO ([id number]) Visit Report for 12/13/2020 Chief Complaint Document Details Patient Name: Date of Service: [place], [name] 12/13/2020 8:15 A M Medical Record Number: [id number] Patient Account Number: [id number] Date of Birth/Sex: Treating RN: Sep 21, 1950 (70 y.o. Anthony Case Primary Care Provider: Leanna Case Other Clinician: Referring Provider: Treating Provider/Extender: Anthony Case in Treatment: 0 Information Obtained from: Patient Chief Complaint Left great toe wound Electronic Signature(s) Signed: 12/13/2020 9:13:22 AM By: Anthony Shan DO Entered By: Anthony Case on 12/13/2020 08:52:39 -------------------------------------------------------------------------------- Debridement Details Patient Name: Date of Service: Anthony Anthony Case, [name] 12/13/2020 8:15 A M Medical Record Number: [id number] Patient Account Number: [id number] Date of Birth/Sex: Treating RN: August 04, 1951 (70 y.o. Anthony Case, Meta.Reding Primary Care Provider: Leanna Case Other Clinician: Referring Provider: Treating Provider/Extender: Anthony Case in Treatment: 0 Debridement Performed for Assessment: Wound #1 Left,Plantar T Great oe Performed By: Physician Anthony Shan, DO Debridement Type: Debridement Severity of Tissue Pre Debridement: Fat layer exposed Level of Consciousness (Pre-procedure): Awake and Alert Pre-procedure Verification/Time Out Yes - 08:35 Taken: Start Time: 08:36 Pain Control: Lidocaine 4% T opical Solution T Area Debrided (L x W): otal 1 (cm) x 1.1 (cm) = 1.1 (cm) Tissue and other material debrided: Viable, Non-Viable, Slough, Subcutaneous, Skin: Dermis , Fibrin/Exudate, Slough Level: Skin/Subcutaneous Tissue Debridement Description: Excisional Instrument: Curette Bleeding: Minimum Hemostasis Achieved: Pressure End Time: 08:40 Procedural Pain: 0 Post Procedural Pain: 0 Response to  Treatment: Procedure was tolerated well Level of Consciousness (Post- Awake and Alert procedure): Post Debridement Measurements of Total Wound Length: (cm) 1 Width: (cm) 1.1 Depth: (cm) 0.1 Volume: (cm) 0.086 Character of Wound/Ulcer Post Debridement: Improved Severity of Tissue Post Debridement: Fat layer exposed Post Procedure Diagnosis Same as Pre-procedure Electronic Signature(s) Signed: 12/13/2020 9:13:22 AM By: Anthony Shan DO Signed: 12/13/2020 5:27:07 PM By: Anthony Case Entered By: Anthony Case on 12/13/2020 08:40:45 -------------------------------------------------------------------------------- HPI Details Patient Name: Date of Service: Anthony Anthony Case, [name] 12/13/2020 8:15 A M Medical Record Number: [id number] Patient Account Number: [id number] Date of Birth/Sex: Treating RN: [birth year] (70 y.o. 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 He presents to the clinic today for a left great toe wound. He states that he stepped on something sharp that sliced the top part of his left great toe back in December 2021. He has been evaluated by his primary care physician and reports taking 2 rounds of antibiotics. He recently saw podiatry and the toe was debrided and padded. He is not currently doing any dressing changes to the wound. He denies increased warmth or erythema to the foot. He denies fever/chills, purulent drainage or nausea/vomiting. Follow-up 12/13/2020 Patient presents for 1 week follow-up. He was evaluated last week for his left great toe ulcer and started on Hydrofera Blue. I sent  him for ABIs with TBI's and he had this done. He also started using a surgical shoe to help with pressure relief on the great toe. He has tolerated this well. He states he saw his podiatrist yesterday for follow-up. They Have referred him to see Anthony Case. They are also planning to do a skin graft to the wound that if it has not  resolved in the next month. Overall patient states he feels well and has no complaints today. Electronic Signature(s) Signed: 12/13/2020 9:13:22 AM By: Anthony Shan DO Entered By: Anthony Case on 12/13/2020 09:02:31 -------------------------------------------------------------------------------- Physical Exam Details Patient Name: Date of Service: Anthony Anthony Case, [name] 12/13/2020 8:15 A M Medical Record Number: [id number] Patient Account Number: [id number] Date of Birth/Sex: Treating RN: July 08, 1951 (70 y.o. Anthony Case Primary Care Provider: Leanna Case Other Clinician: Referring Provider: Treating Provider/Extender: Anthony Case in Treatment: 0 Constitutional respirations regular, non-labored and within target range for patient.. Cardiovascular 2+ dorsalis pedis/posterior tibialis pulses. Notes Left great toe plantar aspect: Granulation tissue present with surrounding callus and necrotic tissue. Improvement since last clinic visit. No swelling noted to the foot. No increased erythema or warmth. Varicose veins present. No signs of infection Electronic Signature(s) Signed: 12/13/2020 9:13:22 AM By: Anthony Shan DO Entered By: Anthony Case on 12/13/2020 09:04:32 -------------------------------------------------------------------------------- Physician Orders Details Patient Name: Date of Service: Anthony Anthony Case, [name] 12/13/2020 8:15 A M Medical Record Number: [id number] Patient Account Number: [id number] Date of Birth/Sex: Treating RN: 1951/08/15 (70 y.o. Anthony Case Primary Care Provider: Leanna Case Other Clinician: Referring Provider: Treating Provider/Extender: Anthony Case in Treatment: 0 Verbal / Phone Orders: No Diagnosis Coding ICD-10 Coding Code Description E11.621 Type 2 diabetes mellitus with foot ulcer E11.40 Type 2 diabetes mellitus with diabetic neuropathy, unspecified Z86.718 Personal history of other venous thrombosis and embolism I10 Essential (primary) hypertension Follow-up Appointments ppointment in 2 weeks. - with Dr. Heber Garber Return A Bathing/ Shower/ Hygiene May shower with protection but do not get wound dressing(s) wet. - use cast protector when not changing the dressing. May shower and wash wound with soap and water. - with dressing changes only. Edema Control - Lymphedema / SCD / Other Elevate legs to the level of the heart or above for 30 minutes daily and/or when sitting, a frequency of: - throughout the day. Avoid standing for long periods of time. Moisturize legs daily. - both legs every night before bed. Additional Orders / Instructions Other: - Follow up with Anthony Case concerning to perform an arteriogram. Wound Treatment Wound #1 - T Great oe Wound Laterality: Plantar, Left Cleanser: Soap and Water Every Other Day/15 Days Discharge Instructions: May shower and wash wound with dial antibacterial soap and water prior to dressing change. Cleanser: Wound Cleanser (Generic) Every Other Day/15 Days Discharge Instructions: Cleanse the wound with wound cleanser prior to applying a clean dressing using gauze sponges, not tissue or cotton balls. Prim Dressing: Hydrofera Blue Classic Foam, 2x2 in (Generic) Every Other Day/15 Days ary Discharge Instructions: Moisten with saline prior to applying to wound bed Secondary Dressing: Woven Gauze Sponge, Non-Sterile 4x4 in (Generic) Every Other Day/15 Days Discharge Instructions: Apply over primary dressing as directed. Secondary Dressing: Optifoam Non-Adhesive Dressing, 4x4 in  (Generic) Every Other Day/15 Days Discharge Instructions: **Foam donut as secondary.** Apply over primary dressing as directed. Secured With: Child psychotherapist, Sterile 2x75 (in/in) (Generic) Every Other Day/15 Days Discharge Instructions: Secure with stretch gauze as directed. Secured With: 29M Medipore H Soft Cloth Surgical Tape, 2x2 (in/yd) (Generic) Every Other Day/15 Days Discharge Instructions: Secure dressing with tape as directed. Electronic Signature(s) Signed: 12/13/2020 9:13:22 AM By: Anthony Shan DO Signed: 12/13/2020 9:13:22 AM By: Anthony Shan DO Entered By: Anthony Case on 12/13/2020 09:05:36 -------------------------------------------------------------------------------- Problem List Details Patient Name: Date of Service: Anthony Anthony Case, [name] 12/13/2020 8:15  A M Medical Record Number: [id number] Patient Account Number: [id number] Date of Birth/Sex: Treating RN: Jan 13, 1951 (70 y.o. Anthony Case Primary Care Provider: Leanna Case Other Clinician: Referring Provider: Treating Provider/Extender: Anthony Case in Treatment: 0 Active Problems ICD-10 Encounter Code Description Active Date MDM Diagnosis E11.621 Type 2 diabetes mellitus with foot ulcer 12/07/2020 No Yes E11.40 Type 2 diabetes mellitus with diabetic neuropathy, unspecified 12/07/2020 No Yes Z86.718 Personal history of other venous thrombosis and embolism 12/07/2020 No Yes I10 Essential (primary) hypertension 12/07/2020 No Yes Inactive Problems Resolved Problems Electronic Signature(s) Signed: 12/13/2020 9:13:22 AM By: Anthony Shan DO Entered By: Anthony Case on 12/13/2020 08:51:44 -------------------------------------------------------------------------------- Progress Note Details Patient Name: Date of Service: Anthony Anthony Case, [name] 12/13/2020 8:15 A M Medical Record Number: [id number] Patient Account Number: [id number] Date of  Birth/Sex: Treating RN: [birth year] (70 y.o. 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 He presents to the clinic today for a left great toe wound. He states that he stepped on something sharp that sliced the top part of his left great toe back in December 2021. He has been evaluated by his primary care physician and reports taking 2 rounds of antibiotics. He recently saw podiatry and the toe was debrided and padded. He is not currently doing any dressing changes to the wound. He denies increased warmth or erythema to the foot. He denies fever/chills, purulent drainage or nausea/vomiting. Follow-up 12/13/2020 Patient presents for 1 week follow-up. He was evaluated last week for his left great toe ulcer and started on Hydrofera Blue. I sent him for ABIs with TBI's and he had this done. He also started using a surgical shoe to help with pressure relief on the great toe. He has tolerated this well. He states he saw his podiatrist yesterday for follow-up. They Have referred him to see Anthony Case. They are also planning to do a skin graft to the wound that if it has not resolved in the next month. Overall patient states he feels well and has no complaints today. Patient History Information obtained from Patient. Family History Heart Disease, No family history of Cancer, Diabetes, Hereditary Spherocytosis, Hypertension, Kidney Disease, Lung Disease, Seizures, Stroke, Thyroid Problems, Tuberculosis. Social History Never smoker, Marital Status -  Married, Alcohol Use - Rarely - prior history, Drug Use - No History, Caffeine Use - Rarely. Medical History Eyes Denies history of Cataracts, Glaucoma, Optic Neuritis Ear/Nose/Mouth/Throat Denies history of Chronic sinus problems/congestion, Middle ear problems Hematologic/Lymphatic Denies history of Anemia, Hemophilia, Human Immunodeficiency Virus, Lymphedema, Sickle Cell Disease Respiratory Denies history of Aspiration, Asthma, Chronic Obstructive Pulmonary Disease (COPD), Pneumothorax, Sleep Apnea, Tuberculosis Cardiovascular Patient has history of Deep Vein Thrombosis - hx DVT Hypertension , Denies history of Angina, Arrhythmia, Congestive Heart Failure, Coronary Artery Disease, Hypotension, Myocardial Infarction, Peripheral Arterial Disease, Peripheral Venous Disease, Phlebitis, Vasculitis Gastrointestinal Denies history of Cirrhosis , Colitis, Crohnoos, Hepatitis A, Hepatitis B, Hepatitis C Endocrine Patient has history of Type II Diabetes Denies history of Type I Diabetes Genitourinary Denies history of End Stage Renal Disease Immunological Denies history of Lupus Erythematosus, Raynaudoos, Scleroderma Integumentary (Skin) Denies history of History of Burn Musculoskeletal  Patient has history of Rheumatoid Arthritis Denies history of Gout, Osteoarthritis, Osteomyelitis Neurologic Patient has history of Neuropathy Denies history of Dementia, Quadriplegia, Paraplegia, Seizure Disorder Oncologic Denies history of Received Chemotherapy, Received Radiation Hospitalization/Surgery History - spine surgery. Medical A Surgical History Notes nd Eyes DM retinopathy Objective Constitutional respirations regular, non-labored and within target range for patient.. Vitals Time Taken: 8:23 AM, Height: 71 in, Source: Stated, Weight: 255 lbs, Source: Stated, BMI: 35.6, Temperature: 97.8 F, Pulse: 66 bpm, Respiratory Rate: 18 breaths/min, Blood Pressure: 116/69  mmHg. Cardiovascular 2+ dorsalis pedis/posterior tibialis pulses. General Notes: Left great toe plantar aspect: Granulation tissue present with surrounding callus and necrotic tissue. Improvement since last clinic visit. No swelling noted to the foot. No increased erythema or warmth. Varicose veins present. No signs of infection Integumentary (Hair, Skin) Wound #1 status is Open. Original cause of wound was Trauma. The date acquired was: 08/10/2020. The wound is located on the [employer]. The oe wound measures 1cm length x 1.1cm width x 0.1cm depth; 0.864cm^2 area and 0.086cm^3 volume. There is Fat Layer (Subcutaneous Tissue) exposed. There is no tunneling or undermining noted. There is a small amount of serosanguineous drainage noted. The wound margin is flat and intact. There is medium (34- 66%) red granulation within the wound bed. There is a medium (34-66%) amount of necrotic tissue within the wound bed including Adherent Slough. Assessment Active Problems ICD-10 Type 2 diabetes mellitus with foot ulcer Type 2 diabetes mellitus with diabetic neuropathy, unspecified Personal history of other venous thrombosis and embolism Essential (primary) hypertension Patient presents for 1 week follow-up. He was started on Hydrofera Blue last week and the wound bed has shown improvement. There is more healthy granulation tissue present. The overall size is stable. There are no signs of infection. He had ABIs with TBI's done. On the left side he had triphasic waveforms at the ATA and PTA with ABIs of 1.3 and 1.7 respectively. With great toe pressures of 106. The right side showed a right great toe pressure of 85. He was referred to Anthony Case for further evaluation. I think at this time there is enough blood flow to the left toe to continue debridements. There has been improvement in wound healing since last clinic visit and I recommended continuing with Hydrofera Blue and his surgical shoe. I  will see him in 2 weeks. Procedures Wound #1 Pre-procedure diagnosis of Wound #1 is a Diabetic Wound/Ulcer of the Lower Extremity located on the Left,Plantar T Great .Severity of Tissue Pre oe Debridement is: Fat layer exposed. There was a Excisional Skin/Subcutaneous Tissue Debridement with a total area of 1.1 sq cm performed by Anthony Shan, DO. With the following instrument(s): Curette to remove Viable and Non-Viable tissue/material. Material removed includes Subcutaneous Tissue, Slough, Skin: Dermis, and Fibrin/Exudate after achieving pain control using Lidocaine 4% Topical Solution. A time out was conducted at 08:35, prior to the start of the procedure. A Minimum amount of bleeding was controlled with Pressure. The procedure was tolerated well with a pain level of 0 throughout and a pain level of 0 following the procedure. Post Debridement Measurements: 1cm length x 1.1cm width x 0.1cm depth; 0.086cm^3 volume. Character of Wound/Ulcer Post Debridement is improved. Severity of Tissue Post Debridement is: Fat layer exposed. Post procedure Diagnosis Wound #1: Same as Pre-Procedure Plan Follow-up Appointments: Return Appointment in 2 weeks. - with Dr. Heber Monona Bathing/ Shower/ Hygiene: May shower with protection but do not get wound dressing(s) wet. - use cast protector when  not changing the dressing. May shower and wash wound with soap and water. - with dressing changes only. Edema Control - Lymphedema / SCD / Other: Elevate legs to the level of the heart or above for 30 minutes daily and/or when sitting, a frequency of: - throughout the day. Avoid standing for long periods of time. Moisturize legs daily. - both legs every night before bed. Additional Orders / Instructions: Other: - Follow up with Anthony Case concerning to perform an arteriogram. WOUND #1: - T Great Wound Laterality: Plantar, Left oe Cleanser: Soap and Water Every Other Day/15 Days Discharge Instructions: May shower  and wash wound with dial antibacterial soap and water prior to dressing change. Cleanser: Wound Cleanser (Generic) Every Other Day/15 Days Discharge Instructions: Cleanse the wound with wound cleanser prior to applying a clean dressing using gauze sponges, not tissue or cotton balls. Prim Dressing: Hydrofera Blue Classic Foam, 2x2 in (Generic) Every Other Day/15 Days ary Discharge Instructions: Moisten with saline prior to applying to wound bed Secondary Dressing: Woven Gauze Sponge, Non-Sterile 4x4 in (Generic) Every Other Day/15 Days Discharge Instructions: Apply over primary dressing as directed. Secondary Dressing: Optifoam Non-Adhesive Dressing, 4x4 in (Generic) Every Other Day/15 Days Discharge Instructions: **Foam donut as secondary.** Apply over primary dressing as directed. Secured With: Child psychotherapist, Sterile 2x75 (in/in) (Generic) Every Other Day/15 Days Discharge Instructions: Secure with stretch gauze as directed. Secured With: 12M Medipore H Soft Cloth Surgical T ape, 2x2 (in/yd) (Generic) Every Other Day/15 Days Discharge Instructions: Secure dressing with tape as directed. 1. Hydrofera Blue every other day 2. Continue offloading with surgical shoe 3. Follow-up in 2 weeks Electronic Signature(s) Signed: 12/13/2020 9:13:22 AM By: Anthony Shan DO Entered By: Anthony Case on 12/13/2020 09:10:39 -------------------------------------------------------------------------------- HxROS Details Patient Name: Date of Service: Anthony Anthony Case, [name] 12/13/2020 8:15 A M Medical Record Number: [id number] Patient Account Number: [id number] Date of Birth/Sex: Treating RN: Jul 10, 1951 (70 y.o. Anthony Case Primary Care Provider: Leanna Case Other Clinician: Referring Provider: Treating Provider/Extender: Anthony Case in Treatment: 0 Information Obtained From Patient Eyes Medical History: Negative for: Cataracts; Glaucoma;  Optic Neuritis Past Medical History Notes: DM retinopathy Ear/Nose/Mouth/Throat Medical History: Negative for: Chronic sinus problems/congestion; Middle ear problems Hematologic/Lymphatic Medical History: Negative for: Anemia; Hemophilia; Human Immunodeficiency Virus; Lymphedema; Sickle Cell Disease Respiratory Medical History: Negative for: Aspiration; Asthma; Chronic Obstructive Pulmonary Disease (COPD); Pneumothorax; Sleep Apnea; Tuberculosis Cardiovascular Medical History: Positive for: Deep Vein Thrombosis - hx DVT Hypertension ; Negative for: Angina; Arrhythmia; Congestive Heart Failure; Coronary Artery Disease; Hypotension; Myocardial Infarction; Peripheral Arterial Disease; Peripheral Venous Disease; Phlebitis; Vasculitis Gastrointestinal Medical History: Negative for: Cirrhosis ; Colitis; Crohns; Hepatitis A; Hepatitis B; Hepatitis C Endocrine Medical History: Positive for: Type II Diabetes Negative for: Type I Diabetes Treated with: Insulin, Oral agents Blood sugar tested every day: No Genitourinary Medical History: Negative for: End Stage Renal Disease Immunological Medical History: Negative for: Lupus Erythematosus; Raynauds; Scleroderma Integumentary (Skin) Medical History: Negative for: History of Burn Musculoskeletal Medical History: Positive for: Rheumatoid Arthritis Negative for: Gout; Osteoarthritis; Osteomyelitis Neurologic Medical History: Positive for: Neuropathy Negative for: Dementia; Quadriplegia; Paraplegia; Seizure Disorder Oncologic Medical History: Negative for: Received Chemotherapy; Received Radiation Immunizations Pneumococcal Vaccine: Received Pneumococcal Vaccination: No Implantable Devices None Hospitalization / Surgery History Type of Hospitalization/Surgery spine surgery Family and Social History Cancer: No; Diabetes: No; Heart Disease: Yes; Hereditary Spherocytosis: No; Hypertension: No; Kidney Disease: No; Lung Disease: No;  Seizures: No; Stroke: No; Thyroid Problems: No;  Tuberculosis: No; Never smoker; Marital Status - Married; Alcohol Use: Rarely - prior history; Drug Use: No History; Caffeine Use: Rarely; Financial Concerns: No; Food, Clothing or Shelter Needs: No; Support System Lacking: No; Transportation Concerns: No Electronic Signature(s) Signed: 12/13/2020 9:13:22 AM By: Anthony Shan DO Signed: 12/13/2020 5:27:07 PM By: Anthony Case Entered By: Anthony Case on 12/13/2020 09:02:38 -------------------------------------------------------------------------------- SuperBill Details Patient Name: Date of Service: Anthony Anthony Case, [name] 12/13/2020 Medical Record Number: [id number] Patient Account Number: [id number] Date of Birth/Sex: Treating RN: 1951/03/08 (70 y.o. Anthony Case Primary Care Provider: Leanna Case Other Clinician: Referring Provider: Treating Provider/Extender: Anthony Case in Treatment: 0 Diagnosis Coding ICD-10 Codes Code Description [phone] Type 2 diabetes mellitus with foot ulcer E11.40 Type 2 diabetes mellitus with diabetic neuropathy, unspecified Z86.718 Personal history of other venous thrombosis and embolism I10 Essential (primary) hypertension Facility Procedures Electronic Signature(s) Signed: 12/13/2020 9:13:22 AM By: Anthony Shan DO Entered By: Anthony Case on 12/13/2020 09:12:47

## 2020-12-14 ENCOUNTER — Other Ambulatory Visit: Payer: Self-pay | Admitting: Podiatry

## 2020-12-14 DIAGNOSIS — L97529 Non-pressure chronic ulcer of other part of left foot with unspecified severity: Secondary | ICD-10-CM

## 2020-12-18 NOTE — Progress Notes (Signed)
ALEXES, LAMARQUE ([id number]) Visit Report for 12/13/2020 Arrival Information Details Patient Name: Date of Service: [place], [name] 12/13/2020 8:15 A M Medical Record Number: [id number] Patient Account Number: [id number] Date of Birth/Sex: Treating RN: 02-Sep-1950 (70 y.o. Ernestene Mention Primary Care Barbi Kumagai: Leanna Battles Other Clinician: Referring Bailyn Spackman: Treating Niomie Englert/Extender: Darrold Junker in Treatment: 0 Visit Information History Since Last Visit All ordered tests and consults were completed: Yes Patient Arrived: Ambulatory Added or deleted any medications: No Arrival Time: 08:18 Any new allergies or adverse reactions: No Accompanied By: self Had a fall or experienced change in No Transfer Assistance: None activities of daily living that may affect Patient Identification Verified: Yes risk of falls: Secondary Verification Process Completed: Yes Signs or symptoms of abuse/neglect since last visito No Patient Requires Transmission-Based Precautions: No Hospitalized since last visit: No Patient Has Alerts: No Implantable device outside of the clinic excluding No cellular tissue based products placed in the center since last visit: Has Dressing in Place as Prescribed: Yes Pain Present Now: No Electronic Signature(s) Signed: 12/14/2020 5:10:31 PM By: Baruch Gouty RN, BSN Entered By: Baruch Gouty on 12/13/2020 08:23:14 -------------------------------------------------------------------------------- Encounter Discharge Information Details Patient Name: Date of Service: [name], [name] 12/13/2020 8:15 A M Medical Record Number: [id number] Patient Account Number: [id number] Date of Birth/Sex: Treating RN: 07-18-1951 (70 y.o. Ernestene Mention Primary Care Rosealie Reach: Leanna Battles Other Clinician: Referring Kabrina Christiano: Treating Ellouise Mcwhirter/Extender: Darrold Junker in Treatment: 0 Encounter  Discharge Information Items Post Procedure Vitals Discharge Condition: Stable Temperature (F): 97.8 Ambulatory Status: Ambulatory Pulse (bpm): 66 Discharge Destination: Home Respiratory Rate (breaths/min): 18 Transportation: Private Auto Blood Pressure (mmHg): 116/69 Accompanied By: self Schedule Follow-up Appointment: Yes Clinical Summary of Care: Patient Declined Electronic Signature(s) Signed: 12/14/2020 5:10:31 PM By: Baruch Gouty RN, BSN Entered By: Baruch Gouty on 12/13/2020 09:01:54 -------------------------------------------------------------------------------- Lower Extremity Assessment Details Patient Name: Date of Service: [name], [name] 12/13/2020 8:15 A M Medical Record Number: [id number] Patient Account Number: [id number] Date of Birth/Sex: Treating RN: 10-01-1950 (70 y.o. Ernestene Mention Primary Care Toniya Rozar: Leanna Battles Other Clinician: Referring Rosielee Corporan: Treating Thetis Schwimmer/Extender: Darrold Junker in Treatment: 0 Edema Assessment Assessed: [Left: No] [Right: No] Edema: [Left: N] [Right: o] Calf Left: Right: Point of Measurement: 34 cm From Medial Instep 40 cm Ankle Left: Right: Point of Measurement: 12 cm From Medial Instep 24.5 cm Vascular Assessment Pulses: Dorsalis Pedis Palpable: [Left:Yes] Electronic Signature(s) Signed: 12/14/2020 5:10:31 PM By: Baruch Gouty RN, BSN Entered By: Baruch Gouty on 12/13/2020 08:25:13 -------------------------------------------------------------------------------- Multi Wound Chart Details Patient Name: Date of Service: [name], [name] 12/13/2020 8:15 A M Medical Record Number: [id number] Patient Account Number: [id number] Date of Birth/Sex: Treating RN: May 01, 1951 (70 y.o. Lorette Ang, Meta.Reding Primary Care Shann Merrick: Leanna Battles Other Clinician: Referring Brayan Votaw: Treating Rishard Delange/Extender: Darrold Junker in Treatment:  0 Vital Signs Height(in): 27 Pulse(bpm): 10 Weight(lbs): 255 Blood Pressure(mmHg): 116/69 Body Mass Index(BMI): 36 Temperature(F): 97.8 Respiratory Rate(breaths/min): 18 Photos: [1:No Photos Left, Plantar T Great oe] [N/A:N/A N/A] Wound Location: [1:Trauma] [N/A:N/A] Wounding Event: [1:Diabetic Wound/Ulcer of the Lower] [N/A:N/A] Primary Etiology: [1:Extremity Deep Vein Thrombosis, Hypertension, N/A] Comorbid History: [1:Type II Diabetes, Rheumatoid Arthritis, Neuropathy 08/10/2020] [N/A:N/A] Date Acquired: [1:0] [N/A:N/A] Weeks of Treatment: [1:Open] [N/A:N/A] Wound Status: [1:1x1.1x0.1] [N/A:N/A] Measurements L x W x D (cm) [1:0.864] [N/A:N/A] A (cm) : rea [1:0.086] [N/A:N/A] Volume (cm) : [1:8.30%] [N/A:N/A] % Reduction in  A rea: [1:8.50%] [N/A:N/A] % Reduction in Volume: [1:Grade 2] [N/A:N/A] Classification: [1:Small] [N/A:N/A] Exudate A mount: [1:Serosanguineous] [N/A:N/A] Exudate Type: [1:red, brown] [N/A:N/A] Exudate Color: [1:Flat and Intact] [N/A:N/A] Wound Margin: [1:Medium (34-66%)] [N/A:N/A] Granulation A mount: [1:Red] [N/A:N/A] Granulation Quality: [1:Medium (34-66%)] [N/A:N/A] Necrotic A mount: [1:Fat Layer (Subcutaneous Tissue): Yes N/A] Exposed Structures: [1:Fascia: No Tendon: No Muscle: No Joint: No Bone: No None] [N/A:N/A] Epithelialization: [1:Debridement - Excisional] [N/A:N/A] Debridement: Pre-procedure Verification/Time Out 08:35 [N/A:N/A] Taken: [1:Lidocaine 4% Topical Solution] [N/A:N/A] Pain Control: [1:Subcutaneous, Slough] [N/A:N/A] Tissue Debrided: [1:Skin/Subcutaneous Tissue] [N/A:N/A] Level: [1:1.1] [N/A:N/A] Debridement A (sq cm): [1:rea Curette] [N/A:N/A] Instrument: [1:Minimum] [N/A:N/A] Bleeding: [1:Pressure] [N/A:N/A] Hemostasis A chieved: [1:0] [N/A:N/A] Procedural Pain: [1:0] [N/A:N/A] Post Procedural Pain: [1:Procedure was tolerated well] [N/A:N/A] Debridement Treatment Response: [1:1x1.1x0.1] [N/A:N/A] Post Debridement  Measurements L x W x D (cm) [1:0.086] [N/A:N/A] Post Debridement Volume: (cm) [1:Debridement] [N/A:N/A] Treatment Notes Electronic Signature(s) Signed: 12/13/2020 9:13:22 AM By: Kalman Shan DO Signed: 12/13/2020 5:27:07 PM By: Deon Pilling Entered By: Kalman Shan on 12/13/2020 08:52:30 -------------------------------------------------------------------------------- Multi-Disciplinary Care Plan Details Patient Name: Date of Service: [name], [name] 12/13/2020 8:15 A M Medical Record Number: [id number] Patient Account Number: [id number] Date of Birth/Sex: Treating RN: Jul 19, 1951 (70 y.o. [name] Primary Care Naydelin Ziegler: Leanna Battles Other Clinician: Referring Jailyn Leeson: Treating Rithy Mandley/Extender: Darrold Junker in Treatment: 0 Active Inactive Nutrition Nursing Diagnoses: Impaired glucose control: actual or potential Goals: Patient/caregiver verbalizes understanding of need to maintain therapeutic glucose control per primary care physician Date Initiated: 12/07/2020 Target Resolution Date: 01/18/2021 Goal Status: Active Interventions: Assess HgA1c results as ordered upon admission and as needed Assess patient nutrition upon admission and as needed per policy Provide education on elevated blood sugars and impact on wound healing Treatment Activities: Obtain HgA1c : 12/07/2020 Notes: Wound/Skin Impairment Nursing Diagnoses: Impaired tissue integrity Goals: Patient/caregiver will verbalize understanding of skin care regimen Date Initiated: 12/07/2020 Target Resolution Date: 01/04/2021 Goal Status: Active Ulcer/skin breakdown will have a volume reduction of 30% by week 4 Date Initiated: 12/07/2020 Target Resolution Date: 01/04/2021 Goal Status: Active Interventions: Assess patient/caregiver ability to obtain necessary supplies Assess patient/caregiver ability to perform ulcer/skin care regimen upon admission and as  needed Assess ulceration(s) every visit Provide education on ulcer and skin care Treatment Activities: Skin care regimen initiated : 12/07/2020 Topical wound management initiated : 12/07/2020 Notes: Electronic Signature(s) Signed: 12/13/2020 5:27:07 PM By: Deon Pilling Entered By: Deon Pilling on 12/13/2020 08:19:18 -------------------------------------------------------------------------------- Pain Assessment Details Patient Name: Date of Service: [name], [name] 12/13/2020 8:15 A M Medical Record Number: [id number] Patient Account Number: [id number] Date of Birth/Sex: Treating RN: 1950/09/19 (70 y.o. Ernestene Mention Primary Care Ezekial Arns: Leanna Battles Other Clinician: Referring Zamaria Brazzle: Treating Tazaria Dlugosz/Extender: Darrold Junker in Treatment: 0 Active Problems Location of Pain Severity and Description of Pain Patient Has Paino No Site Locations Rate the pain. Current Pain Level: 0 Pain Management and Medication Current Pain Management: Electronic Signature(s) Signed: 12/14/2020 5:10:31 PM By: Baruch Gouty RN, BSN Entered By: Baruch Gouty on 12/13/2020 08:24:46 -------------------------------------------------------------------------------- Patient/Caregiver Education Details Patient Name: Date of Service: Montez Morita, [name] 4/21/2022andnbsp8:15 A M Medical Record Number: [id number] Patient Account Number: [id number] Date of Birth/Gender: Treating RN: 26-Aug-1950 (70 y.o. [name] Primary Care Physician: Leanna Battles Other Clinician: Referring Physician: Treating Physician/Extender: Darrold Junker in Treatment: 0 Education Assessment Education Provided To: Patient Education Topics Provided Elevated Blood Sugar/ Impact on Healing: Handouts: Elevated  Blood Sugars: How Do They Affect Wound Healing Methods: Explain/Verbal Responses: Reinforcements needed Electronic  Signature(s) Signed: 12/13/2020 5:27:07 PM By: Deon Pilling Entered By: Deon Pilling on 12/13/2020 08:19:26 -------------------------------------------------------------------------------- Wound Assessment Details Patient Name: Date of Service: [name], [name] 12/13/2020 8:15 A M Medical Record Number: [id number] Patient Account Number: [id number] Date of Birth/Sex: Treating RN: 1951-02-27 (70 y.o. Ernestene Mention Primary Care Kamerin Grumbine: Leanna Battles Other Clinician: Referring Tameshia Bonneville: Treating Yarelly Kuba/Extender: Darrold Junker in Treatment: 0 Wound Status Wound Number: 1 Primary Diabetic Wound/Ulcer of the Lower Extremity Etiology: Wound Location: Left, Plantar T Great oe Wound Open Wounding Event: Trauma Status: Date Acquired: 08/10/2020 Comorbid Deep Vein Thrombosis, Hypertension, Type II Diabetes, Weeks Of Treatment: 0 History: Rheumatoid Arthritis, Neuropathy Clustered Wound: No Photos Wound Measurements Length: (cm) 1 Width: (cm) 1.1 Depth: (cm) 0.1 Area: (cm) 0.864 Volume: (cm) 0.086 % Reduction in Area: 8.3% % Reduction in Volume: 8.5% Epithelialization: None Tunneling: No Undermining: No Wound Description Classification: Grade 2 Wound Margin: Flat and Intact Exudate Amount: Small Exudate Type: Serosanguineous Exudate Color: red, brown Foul Odor After Cleansing: No Slough/Fibrino Yes Wound Bed Granulation Amount: Medium (34-66%) Exposed Structure Granulation Quality: Red Fascia Exposed: No Necrotic Amount: Medium (34-66%) Fat Layer (Subcutaneous Tissue) Exposed: Yes Necrotic Quality: Adherent Slough Tendon Exposed: No Muscle Exposed: No Joint Exposed: No Bone Exposed: No Treatment Notes Wound #1 (Toe Great) Wound Laterality: Plantar, Left Cleanser Soap and Water Discharge Instruction: May shower and wash wound with dial antibacterial soap and water prior to dressing change. Wound Cleanser Discharge  Instruction: Cleanse the wound with wound cleanser prior to applying a clean dressing using gauze sponges, not tissue or cotton balls. Peri-Wound Care Topical Primary Dressing Hydrofera Blue Classic Foam, 2x2 in Discharge Instruction: Moisten with saline prior to applying to wound bed Secondary Dressing Woven Gauze Sponge, Non-Sterile 4x4 in Discharge Instruction: Apply over primary dressing as directed. Optifoam Non-Adhesive Dressing, 4x4 in Discharge Instruction: **Foam donut as secondary.** Apply over primary dressing as directed. Secured With Conforming Stretch Gauze Bandage, Sterile 2x75 (in/in) Discharge Instruction: Secure with stretch gauze as directed. 86M Medipore H Soft Cloth Surgical T ape, 2x2 (in/yd) Discharge Instruction: Secure dressing with tape as directed. Compression Wrap Compression Stockings Add-Ons Electronic Signature(s) Signed: 12/14/2020 5:10:31 PM By: Baruch Gouty RN, BSN Signed: 12/18/2020 2:09:33 PM By: Sandre Kitty Entered By: Sandre Kitty on 12/13/2020 16:42:30 -------------------------------------------------------------------------------- Vitals Details Patient Name: Date of Service: [name], [name] 12/13/2020 8:15 A M Medical Record Number: [id number] Patient Account Number: [id number] Date of Birth/Sex: Treating RN: May 30, 1951 (70 y.o. Ernestene Mention Primary Care Wanda Cellucci: Leanna Battles Other Clinician: Referring Josephyne Tarter: Treating Camren Henthorn/Extender: Darrold Junker in Treatment: 0 Vital Signs Time Taken: 08:23 Temperature (F): 97.8 Height (in): 71 Pulse (bpm): 66 Source: Stated Respiratory Rate (breaths/min): 18 Weight (lbs): 255 Blood Pressure (mmHg): 116/69 Source: Stated Reference Range: 80 - 120 mg / dl Body Mass Index (BMI): 35.6 Electronic Signature(s) Signed: 12/14/2020 5:10:31 PM By: Baruch Gouty RN, BSN Entered By: Baruch Gouty on 12/13/2020 08:24:37

## 2020-12-24 DIAGNOSIS — M459 Ankylosing spondylitis of unspecified sites in spine: Secondary | ICD-10-CM | POA: Diagnosis not present

## 2020-12-24 DIAGNOSIS — Z7901 Long term (current) use of anticoagulants: Secondary | ICD-10-CM | POA: Diagnosis not present

## 2020-12-24 DIAGNOSIS — I10 Essential (primary) hypertension: Secondary | ICD-10-CM | POA: Diagnosis not present

## 2020-12-24 DIAGNOSIS — Z86711 Personal history of pulmonary embolism: Secondary | ICD-10-CM | POA: Diagnosis not present

## 2020-12-24 DIAGNOSIS — Z794 Long term (current) use of insulin: Secondary | ICD-10-CM | POA: Diagnosis not present

## 2020-12-24 DIAGNOSIS — E785 Hyperlipidemia, unspecified: Secondary | ICD-10-CM | POA: Diagnosis not present

## 2020-12-24 DIAGNOSIS — L97521 Non-pressure chronic ulcer of other part of left foot limited to breakdown of skin: Secondary | ICD-10-CM | POA: Diagnosis not present

## 2020-12-24 DIAGNOSIS — E1140 Type 2 diabetes mellitus with diabetic neuropathy, unspecified: Secondary | ICD-10-CM | POA: Diagnosis not present

## 2020-12-24 DIAGNOSIS — E1129 Type 2 diabetes mellitus with other diabetic kidney complication: Secondary | ICD-10-CM | POA: Diagnosis not present

## 2020-12-24 DIAGNOSIS — R2681 Unsteadiness on feet: Secondary | ICD-10-CM | POA: Diagnosis not present

## 2020-12-24 DIAGNOSIS — G4733 Obstructive sleep apnea (adult) (pediatric): Secondary | ICD-10-CM | POA: Diagnosis not present

## 2020-12-24 DIAGNOSIS — L4050 Arthropathic psoriasis, unspecified: Secondary | ICD-10-CM | POA: Diagnosis not present

## 2020-12-25 ENCOUNTER — Ambulatory Visit
Admission: RE | Admit: 2020-12-25 | Discharge: 2020-12-25 | Disposition: A | Discharge status: Home / Self Care | Payer: Medicare Other | Source: Ambulatory Visit | Attending: Podiatry | Admitting: Podiatry

## 2020-12-25 ENCOUNTER — Other Ambulatory Visit: Payer: Self-pay | Admitting: Interventional Radiology

## 2020-12-25 ENCOUNTER — Encounter: Payer: Self-pay | Admitting: *Deleted

## 2020-12-25 ENCOUNTER — Other Ambulatory Visit: Payer: Self-pay | Admitting: *Deleted

## 2020-12-25 DIAGNOSIS — E11621 Type 2 diabetes mellitus with foot ulcer: Secondary | ICD-10-CM | POA: Diagnosis not present

## 2020-12-25 DIAGNOSIS — S91109A Unspecified open wound of unspecified toe(s) without damage to nail, initial encounter: Secondary | ICD-10-CM

## 2020-12-25 DIAGNOSIS — L97529 Non-pressure chronic ulcer of other part of left foot with unspecified severity: Secondary | ICD-10-CM

## 2020-12-25 DIAGNOSIS — I739 Peripheral vascular disease, unspecified: Secondary | ICD-10-CM

## 2020-12-25 DIAGNOSIS — I70245 Atherosclerosis of native arteries of left leg with ulceration of other part of foot: Secondary | ICD-10-CM | POA: Diagnosis not present

## 2020-12-25 HISTORY — PX: IR RADIOLOGIST EVAL & MGMT: IMG5224

## 2020-12-25 NOTE — Consult Note (Addendum)
Chief Complaint: Left Great Toe Wound  Referring Physician(s): Patel,Kevin P  PCP: Dr. Sharlett Iles Wound Care: Dr. Heber [place] Cardiology: Dr. Einar Gip  History of Present Illness: Anthony Case is a 70 y.o. male presenting today as a scheduled consultation for [facility], kindly referred by Dr. Posey Pronto of [facility], for evaluation of left foot wound and possible candidacy for lower extremity angiogram/intervention.  Anthony Case joins [place] today in the clinic by himself.   He tells me that right at Christmas of 2021 he had an injury to the tip of his left great toe when he was barefoot at a house he was helping to renovate.  The injury has persisted.  He was referred to the [facility], and has just recently established his care.  Previously, he was cared for by the team at [facility] for routine foot care, and now for the foot wound.    He tells me that he has never had a wound requiring advanced care on the left or the right.  He does tell me that he has been experience buttock and bilateral thigh cramping when ambulating 1/4 mile to 1/2 mile for at least several months.  This is worse and happens at shorter distance when walking uphill.  When asked about maintaining an erection, he does say that this is currently a problem despite targeted medication for ED.    His CV risk factors include DM2, HTN, HLD, age, strong family history for early CV disease/MI, with several male family members with early MI in 78's and 63's.  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  Currently he is maintained on xarelto.  He is not taking any anti-platelet therapy currently.    He tells me about 4 years ago around 2018 he made "lifestyle changes" based on realization that his health was deteriorating.  He quit ETOH usage (which he reports as high at the time), and lost about 50 lbs, exercising more.    He  tells me that given his current foot problem, he feels his conditioning is deteriorating, as he is not able to exercise as he would like.  He also reports some instability/falls with the foot problem.    He is married with 3 adult children.  He is a never smoker.  He works in Building control surveyor, with his own business, working from home.   Past Medical History:  Diagnosis Date  . Arthritis   . Diabetes mellitus   . Hx pulmonary embolism 2009   required emergent tPA treatment  . Hypertension   . Shortness of breath   . Sleep apnea     Past Surgical History:  Procedure Laterality Date  . BACK SURGERY    . COLONOSCOPY  2009   adenoma polyp  . ELBOW ARTHROSCOPY  2010  . LUMBAR PERCUTANEOUS PEDICLE SCREW 3 LEVEL N/A 05/25/2013   Procedure: Posterior Percutaneous Fixation from Thoracic nine to Lumbar one vertebrae with arthrodesis of Thoracic eleven Fracture;  Surgeon: Kristeen Miss, MD;  Location: [facility];  Service: Neurosurgery;  Laterality: N/A;  Posterior Percutaneous Fixation from Thoracic nine to Lumbar one vertebrae with arthrodesis of Thoracic eleven Fracture    Allergies: Lisinopril  Medications: Prior to Admission medications   Medication Sig Start Date End Date Taking? Authorizing Provider  ACETAMINOPHEN PO Take by mouth. 2 tabs qd  [provider]  atorvastatin (LIPITOR) 20 MG tablet Take 20 mg by mouth daily.    [provider]  dapagliflozin propanediol (FARXIGA) 10 MG TABS tablet Take 10 mg by mouth daily.    [provider]  furosemide (LASIX) 20 MG tablet Take 20 mg by mouth as needed.    [provider]  glipiZIDE (GLUCOTROL XL) 2.5 MG 24 hr tablet 2 (two) times daily.  02/23/19   [provider]  HYDROcodone-acetaminophen (NORCO/VICODIN) 5-325 MG tablet SMARTSIG:1 Tablet(s) By Mouth Every 12 Hours PRN 05/03/19   [provider]  LEVEMIR FLEXTOUCH 100 UNIT/ML FlexPen SMARTSIG:60 Unit(s) SUB-Q Daily 02/04/20   [provider]  losartan (COZAAR) 50 MG tablet Take 50 mg by mouth daily. 03/22/20   [provider]  metFORMIN (GLUCOPHAGE) 500 MG tablet Take 1,000 mg by mouth daily with breakfast.    [provider]  metoprolol succinate (TOPROL-XL) 25 MG 24 hr tablet Take 25 mg by mouth daily. 02/29/20   [provider]  NON FORMULARY Trimix injection    [provider]  Rivaroxaban (XARELTO) 20 MG TABS tablet Take 20 mg by mouth daily. 08/04/13 last dose of xarelto in preparation for colonoscopy 04/09/12   Leanna Battles, MD  Vitamin D, Ergocalciferol, (DRISDOL) 1.25 MG (50000 UT) CAPS capsule Take 50,000 Units by mouth once a week. 02/17/19   [provider]     Family History  Problem Relation Age of Onset  . Heart disease Brother   . Schizophrenia Mother   . Heart disease Father   . Diabetes Father     Social History   Socioeconomic History  . Marital status: Married    Spouse name: Not on file  . Number of children: 3  . Years of education: Not on file  . Highest education level: Not on file  Occupational History  . Occupation: Surveyor, mining: [employer]  Tobacco Use  . Smoking status: Never Smoker  . Smokeless tobacco: Never Used  Vaping Use  . Vaping Use: Never used  Substance and Sexual Activity  . Alcohol use: Not Currently    Comment: Previously drinking 3-5 drinks nightly x 20 year  . Drug use: No  . Sexual activity: Not on file  Other Topics Concern  . Not on file  Social History Narrative   Lives with wife      One story home      Highest level edu- masters      Right handed      Working full times in Economist   Social Determinants of Health   Financial Resource Strain: Not on file  Food Insecurity: Not on file  Transportation Needs: Not on file  Physical Activity: Not on file  Stress: Not on file  Social Connections: Not on file       Review of Systems: A 12 point ROS discussed and pertinent positives  are indicated in the HPI above.  All other systems are negative.  Review of Systems  Vital Signs: There were no vitals taken for this visit.  Physical Exam General: 70 yo male appearing stated age.  Well-developed, well-nourished.  No distress. HEENT: Atraumatic, normocephalic.  Conjugate gaze, extra-ocular motor intact. No scleral icterus or scleral injection. No lesions on external ears, nose, lips, or gums.  Oral mucosa moist, pink.  Neck: Symmetric with no goiter enlargement.  Chest/Lungs:  Symmetric chest with inspiration/expiration.  No labored breathing.  Clear to auscultation with no wheezes,  rhonchi, or rales.  Heart:  RRR, with no third heart sounds appreciated. No JVD appreciated.  Abdomen:  Soft, NT/ND, with + bowel sounds.   Genito-urinary: Deferred Neurologic: Alert & Oriented to person, place, and time.   Normal affect and insight.  Appropriate questions.  Moving all 4 extremities with gross sensory intact.  Pulse Exam:  No bruit appreciated.  No palpable pulsatile abdominal mass. Doppler + signal DP and PT bilateral  Extremities: Superficial wound on the tip of the left great toe, with no erythema or drainage.  No intertriginous wound.  No erythema of the forefoot.  No right foot wound. .       Imaging: VAS [place] ABI WITH/WO TBI  Result Date: 12/12/2020 LOWER EXTREMITY DOPPLER STUDY Indications: Peripheral artery disease, and Left great toe ulceration, Bilateral              calf fatigue when walking that is resolved after 5 min of rest. High Risk Factors: Hypertension, Diabetes. Other Factors: Diabetic foot ulcer with osteomyelitis.  Comparison Study: none Performing Technologist: June Leap RDMS, RVT  Examination Guidelines: A complete evaluation includes at minimum, Doppler waveform signals and systolic blood pressure reading at the level of bilateral brachial, anterior tibial, and posterior tibial arteries, when vessel segments are accessible. Bilateral testing is  considered an integral part of a complete examination. Photoelectric Plethysmograph (PPG) waveforms and toe systolic pressure readings are included as required and additional duplex testing as needed. Limited examinations for reoccurring indications may be performed as noted.  ABI Findings: +---------+------------------+-----+---------+--------+ Right    Rt Pressure (mmHg)IndexWaveform Comment  +---------+------------------+-----+---------+--------+ Brachial 145                                      +---------+------------------+-----+---------+--------+ ATA      255               1.70 triphasic         +---------+------------------+-----+---------+--------+ PTA      255               1.70 triphasic         +---------+------------------+-----+---------+--------+ Great Toe85                0.57 Abnormal          +---------+------------------+-----+---------+--------+ +---------+------------------+-----+---------+-------+ Left     Lt Pressure (mmHg)IndexWaveform Comment +---------+------------------+-----+---------+-------+ Brachial 150                                     +---------+------------------+-----+---------+-------+ ATA      195               1.30 triphasic        +---------+------------------+-----+---------+-------+ PTA      255               1.70 triphasic        +---------+------------------+-----+---------+-------+ Great Toe106               0.71 Normal           +---------+------------------+-----+---------+-------+  Summary: Right: Resting right ankle-brachial index indicates noncompressible right lower extremity arteries. The right toe-brachial index is abnormal. Pedal artery waveforms indicate adequate flow at rest. Left: Resting left ankle-brachial index indicates noncompressible left lower extremity arteries. The left toe-brachial index is normal. Pedal artery waveforms indicate adequate flow at  rest.  *See table(s) above for measurements  and observations.  Electronically signed by Deitra Mayo MD on 12/12/2020 at 9:05:16 AM.    Final     Labs:  CBC: No results for input(s): WBC, HGB, HCT, PLT in the last 8760 hours.  COAGS: No results for input(s): INR, APTT in the last 8760 hours.  BMP: No results for input(s): NA, K, CL, CO2, GLUCOSE, BUN, CALCIUM, CREATININE, GFRNONAA, GFRAA in the last 8760 hours.  Invalid input(s): CMP  LIVER FUNCTION TESTS: No results for input(s): BILITOT, AST, ALT, ALKPHOS, PROT, ALBUMIN in the last 8760 hours.  TUMOR MARKERS: No results for input(s): AFPTM, CEA, CA199, CHROMGRNA in the last 8760 hours.  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     Non-invasive lower extremity exam performed 12/12/20: Right ABI: noncompressible Left ABI: 1.3, probably falsely elevated  Right segmental: triphasic DP and PT Left segmental: biphasic DP and PT.    I had a discussion with Anthony Case regarding anatomy, pathology/pathophysiology, natural history, and prognosis of PAD/CLI, including the "pathway to amputation" pattern that we frequently see with diabetic patients.    While I did tell him that he is, currently, in no way at risk for imminent amputation, it certainly is something we want to avoid.   Informed consent regarding treatment strategies was performed which would possibly include medical management, surgical strategy, and/or endovascular options, with risk/benefit discussion.  The indications for treatment supported by updated guidelines1, 2 were discussed.  Specifically, the indication for possible angiogram/intervention was discussed to investigate for tibial/pedal vessel disease given his slow healing trajectory and CV risk factors.   Regarding endovascular options, specific risks discussed include:  bleeding, infection, contrast reaction, renal injury/nephropathy, arterial injury/dissection, need for additional procedure/surgery, worsening symptoms/tissue including limb loss, cardiopulmonary collapse, death.    I did let him know that given his additional complaints of claudication and ED, there is perhaps a component of aorto-iliac disease that would also have to be addressed, in addition to the high likelihood of tibiopedal disease.    I recommended further diagnostics with CTA runoff to look at the aortoiliac, pelvic, and iliofemoral segments given the above. He understands.   Ultimately, Anthony Case is very interested in avoiding tissue loss/amputation, and he would like to discuss with his wife.    Regarding medical management, maximal medical therapy for reduction of risk factors is indicated as recommended by updated AHA guidelines1.  This includes anti-platelet medication, tight blood glucose control to a HbA1c < 7, tight blood pressure control, maximum-dose HMG-CoA reductase inhibitor, and smoking cessation.     Annual flu vaccination is also recommended, with Class 1 recommendation1.   Plan: -Given his additional complaints of buttock and thigh claudication, cross-sectional anatomic imaging with  CTA run-off will be useful for pre-operative planning, with adequate oral hydration. -After review of the above, we can call him with plan for any possible angiogram/intervention.  -Recommend continuing maximal medical therapy - Recommend continuing excellent wound care  ___________________________________________________________________   1Morley Kos MD, et al. 2016 AHA/ACC Guideline on the Management of Patients With Lower Extremity Peripheral Artery Disease: Executive Summary: A Report of the American College of Cardiology/American Heart Association Task Force on Clinical Practice Guidelines. J Am Coll Cardiol. 2017 Mar 21;69(11):1465-1508. doi: 10.1016/j.jacc.2016.11.008.   2  - Norgren L, et al. Lady Gary  II Working Group. Inter-society consensus for the management of peripheral arterial disease. Int Tressia Miners. 2007 Jun;26(2):81-157. Review. PubMed PMID: [id number]  3 - Hingorani A, et al. The management of diabetic foot: A clinical practice guideline by the Society for Vascular Surgery in collaboration with the [place] and the Society  for Vascular Medicine. J Vasc Surg. 2016 Feb;63(2 Suppl):3S-21S. doi: 10.1016/j.jvs.2015.10.003. PubMed PMID: [id number].  4 - Corinna Gab, Saab FA, Luberta Mutter, Grant Ruts, Ewell Poe, Driver VR, Pala, Lookstein R, van den Baldemar Lenis, Jaff Anthony, Guadalupe Dawn, Henao S, AlMahameed A, Katzen B. Digital Subtraction Angiography Prior to an Amputation for Critical Limb Ischemia (CLI): An Expert Recommendation Statement From the CLI Global Society to Optimize Limb Salvage. J Endovasc Ther. 2020 Aug;27(4):540-546. doi: 10.1177/1526602820928590. Epub 2020 May 29. PMID: [id number].    Thank you for this interesting consult.  I greatly enjoyed meeting Anthony Case and look forward to participating in their care.  A copy of this report was sent to the requesting provider on this date.  Electronically Signed: Corrie Mckusick 12/25/2020, 2:34 PM   I spent a total of  60 Minutes   in face to face in clinical consultation, greater than 50% of which was counseling/coordinating care for left great toe wound, possible angiogram/intervention, Rutherford 5 Class CLI symptoms

## 2020-12-26 ENCOUNTER — Encounter (HOSPITAL_COMMUNITY): Payer: Self-pay

## 2020-12-26 ENCOUNTER — Ambulatory Visit (HOSPITAL_COMMUNITY)
Admission: RE | Admit: 2020-12-26 | Discharge: 2020-12-26 | Disposition: A | Discharge status: Home / Self Care | Payer: Medicare Other | Source: Ambulatory Visit | Attending: Interventional Radiology | Admitting: Interventional Radiology

## 2020-12-26 ENCOUNTER — Other Ambulatory Visit: Payer: Self-pay

## 2020-12-26 DIAGNOSIS — I739 Peripheral vascular disease, unspecified: Secondary | ICD-10-CM | POA: Diagnosis not present

## 2020-12-26 DIAGNOSIS — I708 Atherosclerosis of other arteries: Secondary | ICD-10-CM | POA: Diagnosis not present

## 2020-12-26 DIAGNOSIS — E119 Type 2 diabetes mellitus without complications: Secondary | ICD-10-CM | POA: Diagnosis not present

## 2020-12-26 DIAGNOSIS — S91109A Unspecified open wound of unspecified toe(s) without damage to nail, initial encounter: Secondary | ICD-10-CM | POA: Diagnosis not present

## 2020-12-26 DIAGNOSIS — Z87828 Personal history of other (healed) physical injury and trauma: Secondary | ICD-10-CM | POA: Diagnosis not present

## 2020-12-26 DIAGNOSIS — M7989 Other specified soft tissue disorders: Secondary | ICD-10-CM | POA: Diagnosis not present

## 2020-12-26 LAB — POCT I-STAT CREATININE: Creatinine, Ser: 1 mg/dL (ref 0.61–1.24)

## 2020-12-26 MED ORDER — HEPARIN SOD (PORK) LOCK FLUSH 100 UNIT/ML IV SOLN: 500.0000 [IU] | Freq: Once | INTRAVENOUS | Status: DC

## 2020-12-26 MED ORDER — SODIUM CHLORIDE (PF) 0.9 % IJ SOLN
INTRAMUSCULAR | Status: AC
  Filled 2020-12-26: qty 50

## 2020-12-26 MED ORDER — IOHEXOL 350 MG/ML SOLN
100.0000 mL | Freq: Once | INTRAVENOUS | Status: AC | PRN
  Administered 2020-12-26: 100 mL via INTRAVENOUS

## 2020-12-27 ENCOUNTER — Encounter (HOSPITAL_BASED_OUTPATIENT_CLINIC_OR_DEPARTMENT_OTHER): Payer: Medicare Other | Attending: Internal Medicine | Admitting: Internal Medicine

## 2020-12-27 DIAGNOSIS — Z7901 Long term (current) use of anticoagulants: Secondary | ICD-10-CM | POA: Insufficient documentation

## 2020-12-27 DIAGNOSIS — L97522 Non-pressure chronic ulcer of other part of left foot with fat layer exposed: Secondary | ICD-10-CM | POA: Diagnosis not present

## 2020-12-27 DIAGNOSIS — Z86718 Personal history of other venous thrombosis and embolism: Secondary | ICD-10-CM | POA: Diagnosis not present

## 2020-12-27 DIAGNOSIS — Z794 Long term (current) use of insulin: Secondary | ICD-10-CM | POA: Diagnosis not present

## 2020-12-27 DIAGNOSIS — E11621 Type 2 diabetes mellitus with foot ulcer: Secondary | ICD-10-CM | POA: Insufficient documentation

## 2020-12-27 DIAGNOSIS — E1142 Type 2 diabetes mellitus with diabetic polyneuropathy: Secondary | ICD-10-CM | POA: Diagnosis not present

## 2020-12-27 DIAGNOSIS — L97529 Non-pressure chronic ulcer of other part of left foot with unspecified severity: Secondary | ICD-10-CM | POA: Diagnosis present

## 2020-12-27 DIAGNOSIS — I10 Essential (primary) hypertension: Secondary | ICD-10-CM | POA: Insufficient documentation

## 2020-12-28 ENCOUNTER — Ambulatory Visit
Admission: RE | Admit: 2020-12-28 | Discharge: 2020-12-28 | Disposition: A | Discharge status: Home / Self Care | Payer: Medicare Other | Source: Ambulatory Visit | Attending: Interventional Radiology | Admitting: Interventional Radiology

## 2020-12-28 ENCOUNTER — Other Ambulatory Visit: Payer: Self-pay

## 2020-12-28 ENCOUNTER — Encounter: Payer: Self-pay | Admitting: *Deleted

## 2020-12-28 DIAGNOSIS — I739 Peripheral vascular disease, unspecified: Secondary | ICD-10-CM

## 2020-12-28 DIAGNOSIS — S91109A Unspecified open wound of unspecified toe(s) without damage to nail, initial encounter: Secondary | ICD-10-CM | POA: Diagnosis not present

## 2020-12-28 HISTORY — PX: IR RADIOLOGIST EVAL & MGMT: IMG5224

## 2020-12-28 NOTE — Progress Notes (Signed)
Anthony Case, Anthony Case ([id number]) Visit Report for 12/27/2020 Arrival Information Details Patient Name: Date of Service: [name], [name] 12/27/2020 8:15 A M Medical Record Number: [id number] Patient Account Number: [id number] Date of Birth/Sex: Treating RN: Anthony Case (70 y.o. Male) Anthony Case Primary Care Anthony Case: Anthony Case Anthony Clinician: Referring Analiese Case: Anthony Case in Treatment: 2 Visit Information History Since Last Visit Added or deleted any medications: No Patient Arrived: Ambulatory Any new allergies or adverse reactions: No Arrival Time: 08:18 Had a fall or experienced change in No Accompanied By: self activities of daily living that may affect Transfer Assistance: None risk of falls: Patient Identification Verified: Yes Signs or symptoms of abuse/neglect since last visito No Secondary Verification Process Completed: Yes Hospitalized since last visit: No Patient Requires Transmission-Based Precautions: No Implantable device outside of the clinic excluding No Patient Has Alerts: No cellular tissue based products placed in the center since last visit: Has Dressing in Place as Prescribed: Yes Pain Present Now: No Electronic Signature(s) Signed: 12/27/2020 9:47:30 AM By: Sandre Kitty Entered By: Sandre Kitty on 12/27/2020 08:19:17 -------------------------------------------------------------------------------- Encounter Discharge Information Details Patient Name: Date of Service: Anthony Case, [name] 12/27/2020 8:15 A M Medical Record Number: [id number] Patient Account Number: [id number] Date of Birth/Sex: Treating RN: Anthony Case (70 y.o. Male) Baruch Gouty Primary Care Anthony Case: Anthony Case Anthony Clinician: Referring Tannie Koskela: Anthony Anthony Case in Treatment: 2 Encounter Discharge Information Items Post Procedure Vitals Discharge  Condition: Stable Temperature (F): 97.6 Ambulatory Status: Ambulatory Pulse (bpm): 66 Discharge Destination: Home Respiratory Rate (breaths/min): 18 Transportation: Private Auto Blood Pressure (mmHg): 119/71 Accompanied By: self Schedule Follow-up Appointment: Yes Clinical Summary of Care: Patient Declined Electronic Signature(s) Signed: 12/28/2020 5:11:03 PM By: Baruch Gouty RN, BSN Entered By: Baruch Gouty on 12/27/2020 09:10:40 -------------------------------------------------------------------------------- Lower Extremity Assessment Details Patient Name: Date of Service: Anthony Case, [name] 12/27/2020 8:15 A M Medical Record Number: [id number] Patient Account Number: [id number] Date of Birth/Sex: Treating RN: Anthony Case (70 y.o. Male) Anthony Case Primary Care Lisset Ketchem: Anthony Case Anthony Clinician: Referring Anthony Case: Anthony Anthony Case in Treatment: 2 Edema Assessment Assessed: [Left: Yes] [Right: No] Edema: [Left: N] [Right: o] Calf Left: Right: Point of Measurement: 34 cm From Medial Instep 38 cm Ankle Left: Right: Point of Measurement: 12 cm From Medial Instep 24 cm Vascular Assessment Pulses: Dorsalis Pedis Palpable: [Left:Yes] Electronic Signature(s) Signed: 12/27/2020 5:52:28 PM By: Anthony Case Entered By: Anthony Case on 12/27/2020 08:42:05 -------------------------------------------------------------------------------- Multi Wound Chart Details Patient Name: Date of Service: Anthony Case, [name] 12/27/2020 8:15 A M Medical Record Number: [id number] Patient Account Number: [id number] Date of Birth/Sex: Treating RN: Anthony Case (70 y.o. Male) Anthony Case Primary Care Anthony Case: Anthony Case Anthony Clinician: Referring Anthony Case in Treatment: 2 Vital Signs Height(in): 30 Pulse(bpm): 65 Weight(lbs): 255 Blood  Pressure(mmHg): 119/71 Body Mass Index(BMI): 36 Temperature(F): 97.6 Respiratory Rate(breaths/min): 18 Photos: [1:No Photos Left, Plantar T Great oe] [N/A:N/A N/A] Wound Location: [1:Trauma] [N/A:N/A] Wounding Event: [1:Diabetic Wound/Ulcer of the Lower] [N/A:N/A] Primary Etiology: [1:Extremity Deep Vein Thrombosis, Hypertension, N/A] Comorbid History: [1:Type II Diabetes, Rheumatoid Arthritis, Neuropathy 08/10/2020] [N/A:N/A] Date Acquired: [1:2] [N/A:N/A] Weeks of Treatment: [1:Open] [N/A:N/A] Wound Status: [1:0.6x0.6x0.1] [N/A:N/A] Measurements L x W x D (cm) [1:0.283] [N/A:N/A] A (cm) : rea [1:0.028] [N/A:N/A] Volume (cm) : [1:70.00%] [N/A:N/A] % Reduction in A rea: [1:70.20%] [N/A:N/A] % Reduction in Volume: [1:Grade 2] [N/A:N/A] Classification: [  1:Small] [N/A:N/A] Exudate A mount: [1:Serosanguineous] [N/A:N/A] Exudate Type: [1:red, brown] [N/A:N/A] Exudate Color: [1:Flat and Intact] [N/A:N/A] Wound Margin: [1:Large (67-100%)] [N/A:N/A] Granulation A mount: [1:Red] [N/A:N/A] Granulation Quality: [1:Small (1-33%)] [N/A:N/A] Necrotic A mount: [1:Fat Layer (Subcutaneous Tissue): Yes N/A] Exposed Structures: [1:Fascia: No Tendon: No Muscle: No Joint: No Bone: No Medium (34-66%)] [N/A:N/A] Epithelialization: [1:Debridement - Excisional] [N/A:N/A] Debridement: Pre-procedure Verification/Time Out 08:46 [N/A:N/A] Taken: [1:Lidocaine 4% Topical Solution] [N/A:N/A] Pain Control: [1:Callus, Subcutaneous] [N/A:N/A] Tissue Debrided: [1:Skin/Subcutaneous Tissue] [N/A:N/A] Level: [1:0.36] [N/A:N/A] Debridement A (sq cm): [1:rea Curette] [N/A:N/A] Instrument: [1:Minimum] [N/A:N/A] Bleeding: [1:Pressure] [N/A:N/A] Hemostasis A chieved: [1:Procedure was tolerated well] [N/A:N/A] Debridement Treatment Response: [1:0.6x0.6x0.2] [N/A:N/A] Post Debridement Measurements L x W x D (cm) [1:0.057] [N/A:N/A] Post Debridement Volume: (cm) [1:Calloused Periwound] [N/A:N/A] Assessment Notes:  [1:Debridement] [N/A:N/A] Treatment Notes Wound #1 (Toe Great) Wound Laterality: Plantar, Left Cleanser Wound Cleanser Discharge Instruction: Cleanse the wound with wound cleanser prior to applying a clean dressing using gauze sponges, not tissue or cotton balls. Soap and Water Discharge Instruction: May shower and wash wound with dial antibacterial soap and water prior to dressing change. Peri-Wound Care Topical Primary Dressing Hydrofera Blue Classic Foam, 2x2 in Discharge Instruction: Moisten with saline prior to applying to wound bed Secondary Dressing Woven Gauze Sponge, Non-Sterile 4x4 in Discharge Instruction: Apply over primary dressing as directed. Optifoam Non-Adhesive Dressing, 4x4 in Discharge Instruction: **Foam donut as secondary.** Apply over primary dressing as directed. Secured With 68M [place] Surgical T ape, 2x2 (in/yd) Discharge Instruction: Secure dressing with tape as directed. Conforming Stretch Gauze Bandage, Sterile 2x75 (in/in) Discharge Instruction: Secure with stretch gauze as directed. Compression Wrap Compression Stockings Add-Ons Electronic Signature(s) Signed: 12/27/2020 9:48:37 AM By: Kalman Shan DO Signed: 12/27/2020 6:21:36 PM By: Deon Pilling Signed: 12/27/2020 6:21:36 PM By: Deon Pilling Entered By: Kalman Shan on 12/27/2020 09:39:05 -------------------------------------------------------------------------------- Multi-Disciplinary Care Plan Details Patient Name: Date of Service: Anthony Case, [name] 12/27/2020 8:15 A M Medical Record Number: [id number] Patient Account Number: [id number] Date of Birth/Sex: Treating RN: 12-24-Case (70 y.o. Male) Deon Pilling Primary Care Malky Rudzinski: Anthony Case Anthony Clinician: Referring Hessie Varone: Anthony Babette Stum/Extender: Darrold Case in Treatment: 2 Active Inactive Nutrition Nursing Diagnoses: Impaired glucose control: actual or  potential Goals: Patient/caregiver verbalizes understanding of need to maintain therapeutic glucose control per primary care physician Date Initiated: 12/07/2020 Target Resolution Date: 01/18/2021 Goal Status: Active Interventions: Assess HgA1c results as ordered upon admission and as needed Assess patient nutrition upon admission and as needed per policy Provide education on elevated blood sugars and impact on wound healing Treatment Activities: Obtain HgA1c : 12/07/2020 Notes: Wound/Skin Impairment Nursing Diagnoses: Impaired tissue integrity Goals: Patient/caregiver will verbalize understanding of skin care regimen Date Initiated: 12/07/2020 Target Resolution Date: 01/18/2021 Goal Status: Active Ulcer/skin breakdown will have a volume reduction of 30% by week 4 Date Initiated: 12/07/2020 Target Resolution Date: 01/04/2021 Goal Status: Active Interventions: Assess patient/caregiver ability to obtain necessary supplies Assess patient/caregiver ability to perform ulcer/skin care regimen upon admission and as needed Assess ulceration(s) every visit Provide education on ulcer and skin care Treatment Activities: Skin care regimen initiated : 12/07/2020 Topical wound management initiated : 12/07/2020 Notes: Electronic Signature(s) Signed: 12/27/2020 6:21:36 PM By: Deon Pilling Entered By: Deon Pilling on 12/27/2020 08:32:41 -------------------------------------------------------------------------------- Pain Assessment Details Patient Name: Date of Service: Anthony Case, [name] 12/27/2020 8:15 A M Medical Record Number: [id number] Patient Account Number: [id number] Date of Birth/Sex: Treating RN: [birth year] (70 y.o. Male) Deon Pilling  Primary Care Irasema Chalk: Anthony Case Anthony Clinician: Referring Sharanya Templin: Anthony Beatric Fulop/Extender: Darrold Case in Treatment: 2 Active Problems Location of Pain Severity and Description of Pain Patient Has Paino  No Site Locations Pain Management and Medication Current Pain Management: Electronic Signature(s) Signed: 12/27/2020 9:47:30 AM By: Sandre Kitty Signed: 12/27/2020 6:21:36 PM By: Deon Pilling Entered By: Sandre Kitty on 12/27/2020 08:20:47 -------------------------------------------------------------------------------- Patient/Caregiver Education Details Patient Name: Date of Service: Montez Morita, [name] 5/5/2022andnbsp8:15 A M Medical Record Number: [id number] Patient Account Number: [id number] Date of Birth/Gender: Treating RN: [birth year] Case (70 y.o. Male) Deon Pilling Primary Care Physician: Anthony Case Anthony Clinician: Referring Physician: Treating Physician/Extender: Darrold Case in Treatment: 2 Education Assessment Education Provided To: Patient Education Topics Provided Wound/Skin Impairment: Handouts: Skin Care Do's and Dont's Methods: Explain/Verbal Responses: Reinforcements needed Electronic Signature(s) Signed: 12/27/2020 6:21:36 PM By: Deon Pilling Entered By: Deon Pilling on 12/27/2020 08:32:52 -------------------------------------------------------------------------------- Wound Assessment Details Patient Name: Date of Service: Anthony Case, [name] 12/27/2020 8:15 A M Medical Record Number: [id number] Patient Account Number: [id number] Date of Birth/Sex: Treating RN: [birth year]Case (70 y.o. Male) Deon Pilling Primary Care [place] Rosamond: Anthony Case Anthony Clinician: Referring Alizee Maple: Anthony Velton Roselle/Extender: Darrold Case in Treatment: 2 Wound Status Wound Number: 1 Primary Diabetic Wound/Ulcer of the Lower Extremity Etiology: Wound Location: Left, Plantar T Great oe Wound Open Wounding Event: Trauma Status: Date Acquired: 08/10/2020 Comorbid Deep Vein Thrombosis, Hypertension, Type II Diabetes, Weeks Of Treatment: 2 History: Rheumatoid Arthritis, Neuropathy Clustered Wound:  No Photos Wound Measurements Length: (cm) 0.6 Width: (cm) 0.6 Depth: (cm) 0.1 Area: (cm) 0.283 Volume: (cm) 0.028 % Reduction in Area: 70% % Reduction in Volume: 70.2% Epithelialization: Medium (34-66%) Tunneling: No Undermining: No Wound Description Classification: Grade 2 Wound Margin: Flat and Intact Exudate Amount: Small Exudate Type: Serosanguineous Exudate Color: red, brown Foul Odor After Cleansing: No Slough/Fibrino Yes Wound Bed Granulation Amount: Large (67-100%) Exposed Structure Granulation Quality: Red Fascia Exposed: No Necrotic Amount: Small (1-33%) Fat Layer (Subcutaneous Tissue) Exposed: Yes Necrotic Quality: Adherent Slough Tendon Exposed: No Muscle Exposed: No Joint Exposed: No Bone Exposed: No Assessment Notes Calloused Periwound Treatment Notes Wound #1 (Toe Great) Wound Laterality: Plantar, Left Cleanser Wound Cleanser Discharge Instruction: Cleanse the wound with wound cleanser prior to applying a clean dressing using gauze sponges, not tissue or cotton balls. Soap and Water Discharge Instruction: May shower and wash wound with dial antibacterial soap and water prior to dressing change. Peri-Wound Care Topical Primary Dressing Hydrofera Blue Classic Foam, 2x2 in Discharge Instruction: Moisten with saline prior to applying to wound bed Secondary Dressing Woven Gauze Sponge, Non-Sterile 4x4 in Discharge Instruction: Apply over primary dressing as directed. Optifoam Non-Adhesive Dressing, 4x4 in Discharge Instruction: **Foam donut as secondary.** Apply over primary dressing as directed. Secured With 21M [place] Surgical T ape, 2x2 (in/yd) Discharge Instruction: Secure dressing with tape as directed. Conforming Stretch Gauze Bandage, Sterile 2x75 (in/in) Discharge Instruction: Secure with stretch gauze as directed. Compression Wrap Compression Stockings Add-Ons Electronic Signature(s) Signed: 12/27/2020 5:12:45 PM By:  Sandre Kitty Signed: 12/27/2020 6:21:36 PM By: Deon Pilling Entered By: Sandre Kitty on 12/27/2020 17:04:47 -------------------------------------------------------------------------------- Vitals Details Patient Name: Date of Service: Anthony Case, [name] 12/27/2020 8:15 A M Medical Record Number: [id number] Patient Account Number: [id number] Date of Birth/Sex: Treating RN: [birth year] (70 y.o. Male) Deon Pilling Primary Care Khristine Verno: Anthony Case Anthony Clinician: Referring Eulala Newcombe: Anthony Maximino Cozzolino/Extender: Darrold Case in  Treatment: 2 Vital Signs Time Taken: 08:20 Temperature (F): 97.6 Height (in): 71 Pulse (bpm): 66 Weight (lbs): 255 Respiratory Rate (breaths/min): 18 Body Mass Index (BMI): 35.6 Blood Pressure (mmHg): 119/71 Reference Range: 80 - 120 mg / dl Electronic Signature(s) Signed: 12/27/2020 9:47:30 AM By: Sandre Kitty Entered By: Sandre Kitty on 12/27/2020 08:20:42

## 2020-12-28 NOTE — Progress Notes (Signed)
Interventional Radiology Progress Note  Assessment:  Anthony Case is a 70yo male presenting with slow healing wound of the left great toe.   He has evidence on CTA run off and his non-invasive of tibial/small vessel disease.    His CTA is negative for aortoiliac disease that would account for hip and thigh claudication symptoms.  I suspect that these symptoms are non-vascular.   I called him today to review his case and his CTA findings, to review his diagnosis, and discuss all above.    He says that he had visit yesterday with his wound care team and that the wound is healing, about 50% improved from baseline.    He has no evidence currently of infection.  He certainly is not at imminent amputation risk.   We discussed the indication for angiogram and possible intervention of left tibial/pedal targets to improve blood flow/healing.   After our discussion and considering his positive trajectory for healing, he would like to continue conservative management now, and defer angiogram.   I think that is reasonable.  We will follow up in 6 months.  If he has any plateau or regression we are happy to see him back earlier to discuss.   Plan: - 6 month office visit for wound check.  - continue conservative management.     Signed,  Dulcy Fanny. Earleen Newport, DO

## 2021-01-01 NOTE — Progress Notes (Signed)
GALYN, KLEMENS ([id number]) Visit Report for 12/27/2020 Chief Complaint Document Details Patient Name: Date of Service: [name], [name] 12/27/2020 8:15 A M Medical Record Number: [id number] Patient Account Number: [id number] Date of Birth/Sex: Treating RN: December 06, 1950 (70 y.o. Male) Deon Pilling Primary Care Provider: Leanna Battles Other Clinician: Referring Provider: Treating Provider/Extender: Darrold Junker in Treatment: 2 Information Obtained from: Patient Chief Complaint Left great toe wound Electronic Signature(s) Signed: 12/27/2020 9:48:37 AM By: Kalman Shan DO Entered By: Kalman Shan on 12/27/2020 09:39:36 -------------------------------------------------------------------------------- Debridement Details Patient Name: Date of Service: [name], [name] 12/27/2020 8:15 A M Medical Record Number: [id number] Patient Account Number: [id number] Date of Birth/Sex: Treating RN: 1951/03/24 (70 y.o. Male) Lorrin Jackson Primary Care Provider: Leanna Battles Other Clinician: Referring Provider: Treating Provider/Extender: Darrold Junker in Treatment: 2 Debridement Performed for Assessment: Wound #1 Left,Plantar T Great oe Performed By: Physician Kalman Shan, DO Debridement Type: Debridement Severity of Tissue Pre Debridement: Fat layer exposed Level of Consciousness (Pre-procedure): Awake and Alert Pre-procedure Verification/Time Out Yes - 08:46 Taken: Start Time: 08:47 Pain Control: Lidocaine 4% T opical Solution T Area Debrided (L x W): otal 0.6 (cm) x 0.6 (cm) = 0.36 (cm) Tissue and other material debrided: Non-Viable, Callus, Subcutaneous Level: Skin/Subcutaneous Tissue Debridement Description: Excisional Instrument: Curette Bleeding: Minimum Hemostasis Achieved: Pressure End Time: 08:51 Response to Treatment: Procedure was tolerated well Level of Consciousness (Post- Awake and  Alert procedure): Post Debridement Measurements of Total Wound Length: (cm) 0.6 Width: (cm) 0.6 Depth: (cm) 0.2 Volume: (cm) 0.057 Character of Wound/Ulcer Post Debridement: Stable Severity of Tissue Post Debridement: Fat layer exposed Post Procedure Diagnosis Same as Pre-procedure Electronic Signature(s) Signed: 12/27/2020 9:48:37 AM By: Kalman Shan DO Signed: 12/27/2020 5:52:28 PM By: Lorrin Jackson Entered By: Lorrin Jackson on 12/27/2020 08:55:16 -------------------------------------------------------------------------------- HPI Details Patient Name: Date of Service: [name], [name] 12/27/2020 8:15 A M Medical Record Number: [id number] Patient Account Number: [id number] Date of Birth/Sex: Treating RN: 08/04/1951 (70 y.o. 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 He presents to the clinic today for a left great toe wound. He states that he stepped on something sharp that sliced the top part of his left great toe back in December 2021. He has been evaluated by his primary care physician and reports taking 2 rounds of antibiotics. He recently saw podiatry and the toe was debrided and padded. He is not currently doing any dressing changes to the wound. He denies increased warmth or erythema to the foot. He denies fever/chills, purulent drainage or nausea/vomiting. 12/13/2020: Patient presents for 1 week follow-up. He was evaluated last week for his left great toe ulcer and started on Hydrofera Blue. I sent him for ABIs with TBI's and he had this done. He also started using a surgical shoe to  help with pressure relief on the great toe. He has tolerated this well. He states he saw his podiatrist yesterday for follow-up. They Have referred him to see Dr. Earleen Newport. They are also planning to do a skin graft to the wound that if it has not resolved in the next month. Overall patient states he feels well and has no  complaints today. 5/5; patient presents for 1 week follow-up. He reports no issues today. He has been using Hydrofera Blue. He states he saw Dr. Earleen Newport and had a Imaging of his lower extremities. He states he feels well overall. He is going on a trip to [place] in 1-2 weeks. Electronic Signature(s) Signed: 12/27/2020 9:48:37 AM By: Kalman Shan DO Entered By: Kalman Shan on 12/27/2020 09:42:12 -------------------------------------------------------------------------------- Physical Exam Details Patient Name: Date of Service: [name], [name] 12/27/2020 8:15 A M Medical Record Number: [id number] Patient Account Number: [id number] Date of Birth/Sex: Treating RN: 25-Jul-1951 (70 y.o. Male) Deon Pilling Primary Care Provider: Leanna Battles Other Clinician: Referring Provider: Treating Provider/Extender: Darrold Junker in Treatment: 2 Constitutional respirations regular, non-labored and within target range for patient.. Cardiovascular 2+ dorsalis pedis/posterior tibialis pulses. Psychiatric pleasant and cooperative. Notes Left foot: Plantar great toe has an open wound to the most distal end. This has some necrotic debris but post debridement there is excellent granulation tissue present. There is circumferential callus and this was removed. No signs of infection. Electronic Signature(s) Signed: 12/27/2020 9:48:37 AM By: Kalman Shan DO Entered By: Kalman Shan on 12/27/2020 09:42:26 -------------------------------------------------------------------------------- Physician Orders Details Patient Name: Date of Service: [name],  [name] 12/27/2020 8:15 A M Medical Record Number: [id number] Patient Account Number: [id number] Date of Birth/Sex: Treating RN: Mar 27, 1951 (70 y.o. Male) Lorrin Jackson Primary Care Provider: Leanna Battles Other Clinician: Referring Provider: Treating Provider/Extender: Darrold Junker in Treatment: 2 Verbal / Phone Orders: No Diagnosis Coding ICD-10 Coding Code Description E11.621 Type 2 diabetes mellitus with foot ulcer E11.40 Type 2 diabetes mellitus with diabetic neuropathy, unspecified Z86.718 Personal history of other venous thrombosis and embolism I10 Essential (primary) hypertension Follow-up Appointments ppointment in 1 week. - with Dr. Heber [place] Return A Bathing/ Shower/ Hygiene May shower with protection but do not get wound dressing(s) wet. - use cast protector when not changing the dressing. May shower and wash wound with soap and water. - with dressing changes only. Edema Control - Lymphedema / SCD / Other Elevate legs to the level of the heart or above for 30 minutes daily and/or when sitting, a frequency of: - throughout the day. Avoid standing for long periods of time. Moisturize legs daily. - both legs every night before bed. Additional Orders / Instructions Other: - Has had follow up with Dr. Earleen Newport: possible arteriogram. Wound Treatment Wound #1 - T Great oe Wound Laterality: Plantar, Left Cleanser: Soap and Water Every Other Day/15 Days Discharge Instructions: May shower and wash wound with dial antibacterial soap and water prior to dressing change. Cleanser: Wound Cleanser (Generic) Every Other Day/15 Days Discharge Instructions: Cleanse the wound with wound cleanser prior to applying a clean dressing using gauze sponges, not tissue or cotton balls. Prim Dressing: Hydrofera Blue Classic Foam, 2x2 in (Generic) Every Other Day/15 Days ary Discharge Instructions: Moisten with saline prior to applying to wound bed Secondary  Dressing: Woven Gauze Sponge, Non-Sterile 4x4 in (Generic) Every Other Day/15 Days Discharge Instructions: Apply over primary dressing as directed. Secondary Dressing: Optifoam Non-Adhesive Dressing, 4x4 in (Generic) Every Other Day/15 Days Discharge Instructions: **Foam donut as secondary.** Apply over primary dressing as directed. Secured With: Child psychotherapist, Sterile 2x75 (in/in) (Generic) Every Other Day/15 Days Discharge Instructions: Secure with stretch gauze as directed. Secured With: 88M Medipore H Soft Cloth Surgical Tape, 2x2 (in/yd) (Generic) Every Other Day/15 Days Discharge Instructions: Secure dressing with tape as directed. Electronic  Signature(s) Signed: 12/27/2020 5:52:28 PM By: Lorrin Jackson Signed: 01/01/2021 3:06:19 PM By: Kalman Shan DO Previous Signature: 12/27/2020 9:48:37 AM Version By: Kalman Shan DO Entered By: Lorrin Jackson on 12/27/2020 11:42:00 -------------------------------------------------------------------------------- Problem List Details Patient Name: Date of Service: [name], [name] 12/27/2020 8:15 A M Medical Record Number: [id number] Patient Account Number: [id number] Date of Birth/Sex: Treating RN: August 05, 1951 (70 y.o. Male) Deon Pilling Primary Care Provider: Leanna Battles Other Clinician: Referring Provider: Treating Provider/Extender: Darrold Junker in Treatment: 2 Active Problems ICD-10 Encounter Code Description Active Date MDM Diagnosis E11.621 Type 2 diabetes mellitus with foot ulcer 12/07/2020 No Yes E11.40 Type 2 diabetes mellitus with diabetic neuropathy, unspecified 12/07/2020 No Yes Z86.718 Personal history of other venous thrombosis and embolism 12/07/2020 No Yes I10 Essential (primary) hypertension 12/07/2020 No Yes Inactive Problems Resolved Problems Electronic Signature(s) Signed: 12/27/2020 9:48:37 AM By: Kalman Shan DO Entered By: Kalman Shan on 12/27/2020  09:38:59 -------------------------------------------------------------------------------- Progress Note Details Patient Name: Date of Service: [name], [name] 12/27/2020 8:15 A M Medical Record Number: [id number] Patient Account Number: [id number] Date of Birth/Sex: Treating RN: [birth year] (71 y.o. 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 He presents to the clinic today for a left great toe wound. He states that he stepped on something sharp that sliced the top part of his left great toe back in December 2021. He has been evaluated by his primary care physician and reports taking 2 rounds of antibiotics. He recently saw podiatry and the toe was debrided and padded. He is not currently doing any dressing changes to the wound. He denies increased warmth or erythema to the foot. He denies fever/chills, purulent drainage or nausea/vomiting. 12/13/2020: Patient presents for 1 week follow-up. He was evaluated last week for his left great toe ulcer and started on Hydrofera Blue. I sent him for ABIs with TBI's and he had this done. He also started using a surgical shoe to help with pressure relief on the great toe. He has tolerated this well. He states he saw his podiatrist yesterday for follow-up. They Have referred him to see Dr. Earleen Newport. They are also planning to do a skin graft to the wound that if it has not resolved in the next month. Overall patient states he feels well and has no complaints today. 5/5; patient presents for 1 week  follow-up. He reports no issues today. He has been using Hydrofera Blue. He states he saw Dr. Earleen Newport and had a Imaging of his lower extremities. He states he feels well overall. He is going on a trip to [place] in 1-2 weeks. Patient History Information obtained from Patient. Family History Heart Disease, No family history of Cancer, Diabetes, Hereditary Spherocytosis, Hypertension, Kidney Disease, Lung Disease, Seizures, Stroke, Thyroid Problems, Tuberculosis. Social History Never smoker, Marital Status - Married, Alcohol Use - Rarely - prior history, Drug Use - No History, Caffeine Use - Rarely. Medical History Eyes Denies history of Cataracts, Glaucoma, Optic Neuritis Ear/Nose/Mouth/Throat Denies history of Chronic sinus problems/congestion, Middle ear problems Hematologic/Lymphatic Denies history of Anemia, Hemophilia, Human Immunodeficiency Virus, Lymphedema, Sickle Cell Disease Respiratory Denies history of Aspiration,  Asthma, Chronic Obstructive Pulmonary Disease (COPD), Pneumothorax, Sleep Apnea, Tuberculosis Cardiovascular Patient has history of Deep Vein Thrombosis - hx DVT Hypertension , Denies history of Angina, Arrhythmia, Congestive Heart Failure, Coronary Artery Disease, Hypotension, Myocardial Infarction, Peripheral Arterial Disease, Peripheral Venous Disease, Phlebitis, Vasculitis Gastrointestinal Denies history of Cirrhosis , Colitis, Crohnoos, Hepatitis A, Hepatitis B, Hepatitis C Endocrine Patient has history of Type II Diabetes Denies history of Type I Diabetes Genitourinary Denies history of End Stage Renal Disease Immunological Denies history of Lupus Erythematosus, Raynaudoos, Scleroderma Integumentary (Skin) Denies history of History of Burn Musculoskeletal Patient has history of Rheumatoid Arthritis Denies history of Gout, Osteoarthritis, Osteomyelitis Neurologic Patient has history of Neuropathy Denies history of Dementia, Quadriplegia, Paraplegia,  Seizure Disorder Oncologic Denies history of Received Chemotherapy, Received Radiation Hospitalization/Surgery History - spine surgery. Medical A Surgical History Notes nd Eyes DM retinopathy Objective Constitutional respirations regular, non-labored and within target range for patient.. Vitals Time Taken: 8:20 AM, Height: 71 in, Weight: 255 lbs, BMI: 35.6, Temperature: 97.6 F, Pulse: 66 bpm, Respiratory Rate: 18 breaths/min, Blood Pressure: 119/71 mmHg. Cardiovascular 2+ dorsalis pedis/posterior tibialis pulses. Psychiatric pleasant and cooperative. General Notes: Left foot: Plantar great toe has an open wound to the most distal end. This has some necrotic debris but post debridement there is excellent granulation tissue present. There is circumferential callus and this was removed. No signs of infection. Integumentary (Hair, Skin) Wound #1 status is Open. Original cause of wound was Trauma. The date acquired was: 08/10/2020. The wound has been in treatment 2 weeks. The wound is located on the [employer]. The wound measures 0.6cm length x 0.6cm width x 0.1cm depth; 0.283cm^2 area and 0.028cm^3 volume. There is Fat oe Layer (Subcutaneous Tissue) exposed. There is no tunneling or undermining noted. There is a small amount of serosanguineous drainage noted. The wound margin is flat and intact. There is large (67-100%) red granulation within the wound bed. There is a small (1-33%) amount of necrotic tissue within the wound bed including Adherent Slough. General Notes: Calloused Periwound Assessment Active Problems ICD-10 Type 2 diabetes mellitus with foot ulcer Type 2 diabetes mellitus with diabetic neuropathy, unspecified Personal history of other venous thrombosis and embolism Essential (primary) hypertension Overall patient is doing well. The wound has improved in size and appearance since last clinic visit. There was some necrotic debris and this was debrided  in office. Overall the wound looks healthy and healing. He had CT angiography of abdominal aorta with iliofemoral runoff that showed Bilateral tibial and pedal circumferential calcifications, limiting evaluation of the tibial artery secondary to blooming artifact from the calcium. He follows with Dr. Earleen Newport for these results and future plans. I will see him back in a week before his trip to [place] Procedures Wound #1 Pre-procedure diagnosis of Wound #1 is a Diabetic Wound/Ulcer of the Lower Extremity located on the [place] .Severity of Tissue Pre oe Debridement is: Fat layer exposed. There was a Excisional Skin/Subcutaneous Tissue Debridement with a total area of 0.36 sq cm performed by Kalman Shan, DO. With the following instrument(s): Curette to remove Non-Viable tissue/material. Material removed includes Callus and Subcutaneous Tissue and after achieving pain control using Lidocaine 4% Topical Solution. No specimens were taken. A time out was conducted at 08:46, prior to the start of the procedure. A Minimum amount of bleeding was controlled with Pressure. The procedure was tolerated well. Post Debridement Measurements: 0.6cm length x 0.6cm width x 0.2cm depth; 0.057cm^3 volume. Character of Wound/Ulcer Post Debridement is  stable. Severity of Tissue Post Debridement is: Fat layer exposed. Post procedure Diagnosis Wound #1: Same as Pre-Procedure Plan Follow-up Appointments: Return Appointment in 1 week. - with Dr. Heber [place] Bathing/ Shower/ Hygiene: May shower with protection but do not get wound dressing(s) wet. - use cast protector when not changing the dressing. May shower and wash wound with soap and water. - with dressing changes only. Edema Control - Lymphedema / SCD / Other: Elevate legs to the level of the heart or above for 30 minutes daily and/or when sitting, a frequency of: - throughout the day. Avoid standing for long periods of time. Moisturize legs daily. -  both legs every night before bed. Additional Orders / Instructions: Other: - Has hadFollow up with Dr. Earleen Newport concerning to perform an arteriogram. WOUND #1: - T Great Wound Laterality: Plantar, Left oe Cleanser: Soap and Water Every Other Day/15 Days Discharge Instructions: May shower and wash wound with dial antibacterial soap and water prior to dressing change. Cleanser: Wound Cleanser (Generic) Every Other Day/15 Days Discharge Instructions: Cleanse the wound with wound cleanser prior to applying a clean dressing using gauze sponges, not tissue or cotton balls. Prim Dressing: Hydrofera Blue Classic Foam, 2x2 in (Generic) Every Other Day/15 Days ary Discharge Instructions: Moisten with saline prior to applying to wound bed Secondary Dressing: Woven Gauze Sponge, Non-Sterile 4x4 in (Generic) Every Other Day/15 Days Discharge Instructions: Apply over primary dressing as directed. Secondary Dressing: Optifoam Non-Adhesive Dressing, 4x4 in (Generic) Every Other Day/15 Days Discharge Instructions: **Foam donut as secondary.** Apply over primary dressing as directed. Secured With: Child psychotherapist, Sterile 2x75 (in/in) (Generic) Every Other Day/15 Days Discharge Instructions: Secure with stretch gauze as directed. Secured With: 82M Medipore H Soft Cloth Surgical T ape, 2x2 (in/yd) (Generic) Every Other Day/15 Days Discharge Instructions: Secure dressing with tape as directed. 1. Hydrofera Blue 2. In office sharp debridement 3. Follow-up in 1 week Electronic Signature(s) Signed: 12/27/2020 9:48:37 AM By: Kalman Shan DO Entered By: Kalman Shan on 12/27/2020 09:46:42 -------------------------------------------------------------------------------- HxROS Details Patient Name: Date of Service: [name], [name] 12/27/2020 8:15 A M Medical Record Number: [id number] Patient Account Number: [id number] Date of Birth/Sex: Treating RN: [birth year] (70 y.o. Male) Deon Pilling Primary Care Provider: Leanna Battles Other Clinician: Referring Provider: Treating Provider/Extender: Darrold Junker in Treatment: 2 Information Obtained From Patient Eyes Medical History: Negative for: Cataracts; Glaucoma; Optic Neuritis Past Medical History Notes: DM retinopathy Ear/Nose/Mouth/Throat Medical History: Negative for: Chronic sinus problems/congestion; Middle ear problems Hematologic/Lymphatic Medical History: Negative for: Anemia; Hemophilia; Human Immunodeficiency Virus; Lymphedema; Sickle Cell Disease Respiratory Medical History: Negative for: Aspiration; Asthma; Chronic Obstructive Pulmonary Disease (COPD); Pneumothorax; Sleep Apnea; Tuberculosis Cardiovascular Medical History: Positive for: Deep Vein Thrombosis - hx DVT Hypertension ; Negative for: Angina; Arrhythmia; Congestive Heart Failure; Coronary Artery Disease; Hypotension; Myocardial Infarction; Peripheral Arterial Disease; Peripheral Venous Disease; Phlebitis; Vasculitis Gastrointestinal Medical History: Negative for: Cirrhosis ; Colitis; Crohns; Hepatitis A; Hepatitis B; Hepatitis C Endocrine Medical History: Positive for: Type II Diabetes Negative for: Type I Diabetes Treated with: Insulin, Oral agents Blood sugar tested every day: No Genitourinary Medical History: Negative for: End Stage Renal Disease Immunological Medical History: Negative for: Lupus Erythematosus; Raynauds; Scleroderma Integumentary (Skin) Medical History: Negative for: History of Burn Musculoskeletal Medical History: Positive for: Rheumatoid Arthritis Negative for: Gout; Osteoarthritis; Osteomyelitis Neurologic Medical History: Positive for: Neuropathy Negative for: Dementia; Quadriplegia; Paraplegia; Seizure Disorder Oncologic Medical History: Negative for: Received Chemotherapy; Received Radiation Immunizations Pneumococcal Vaccine:  Received Pneumococcal Vaccination:  No Implantable Devices None Hospitalization / Surgery History Type of Hospitalization/Surgery spine surgery Family and Social History Cancer: No; Diabetes: No; Heart Disease: Yes; Hereditary Spherocytosis: No; Hypertension: No; Kidney Disease: No; Lung Disease: No; Seizures: No; Stroke: No; Thyroid Problems: No; Tuberculosis: No; Never smoker; Marital Status - Married; Alcohol Use: Rarely - prior history; Drug Use: No History; Caffeine Use: Rarely; Financial Concerns: No; Food, Clothing or Shelter Needs: No; Support System Lacking: No; Transportation Concerns: No Electronic Signature(s) Signed: 12/27/2020 9:48:37 AM By: Kalman Shan DO Signed: 12/27/2020 6:21:36 PM By: Deon Pilling Entered By: Kalman Shan on 12/27/2020 09:42:20 -------------------------------------------------------------------------------- SuperBill Details Patient Name: Date of Service: [name], [name] 12/27/2020 Medical Record Number: [id number] Patient Account Number: [id number] Date of Birth/Sex: Treating RN: Feb 13, 1951 (70 y.o. Male) Lorrin Jackson Primary Care Provider: Leanna Battles Other Clinician: Referring Provider: Treating Provider/Extender: Darrold Junker in Treatment: 2 Diagnosis Coding ICD-10 Codes Code Description [phone] Type 2 diabetes mellitus with foot ulcer E11.40 Type 2 diabetes mellitus with diabetic neuropathy, unspecified Z86.718 Personal history of other venous thrombosis and embolism I10 Essential (primary) hypertension Facility Procedures CPT4 Code: [id number] Description: 59563 - DEB SUBQ TISSUE 20 SQ CM/< ICD-10 Diagnosis Description E11.621 Type 2 diabetes mellitus with foot ulcer Modifier: Quantity: 1 Physician Procedures : CPT4 Code Description Modifier [id number] - WC PHYS SUBQ TISS 20 SQ CM ICD-10 Diagnosis Description E11.621 Type 2 diabetes mellitus with foot ulcer Quantity: 1 Electronic Signature(s) Signed: 12/27/2020 9:48:37 AM  By: Kalman Shan DO Entered By: Kalman Shan on 12/27/2020 09:48:15

## 2021-01-08 ENCOUNTER — Other Ambulatory Visit: Payer: Self-pay

## 2021-01-08 ENCOUNTER — Encounter (HOSPITAL_BASED_OUTPATIENT_CLINIC_OR_DEPARTMENT_OTHER): Payer: Medicare Other | Admitting: Internal Medicine

## 2021-01-08 DIAGNOSIS — E11621 Type 2 diabetes mellitus with foot ulcer: Secondary | ICD-10-CM | POA: Diagnosis not present

## 2021-01-08 DIAGNOSIS — Z86718 Personal history of other venous thrombosis and embolism: Secondary | ICD-10-CM | POA: Diagnosis not present

## 2021-01-08 DIAGNOSIS — L97522 Non-pressure chronic ulcer of other part of left foot with fat layer exposed: Secondary | ICD-10-CM | POA: Diagnosis not present

## 2021-01-08 DIAGNOSIS — Z7901 Long term (current) use of anticoagulants: Secondary | ICD-10-CM | POA: Diagnosis not present

## 2021-01-08 DIAGNOSIS — E1142 Type 2 diabetes mellitus with diabetic polyneuropathy: Secondary | ICD-10-CM | POA: Diagnosis not present

## 2021-01-08 DIAGNOSIS — I10 Essential (primary) hypertension: Secondary | ICD-10-CM | POA: Diagnosis not present

## 2021-01-10 NOTE — Progress Notes (Signed)
GABINO, HAGIN ([id number]) Visit Report for 01/08/2021 Arrival Information Details Patient Name: Date of Service: [place], [name] 01/08/2021 8:30 A M Medical Record Number: [id number] Patient Account Number: [id number] Date of Birth/Sex: Treating RN: 27-Mar-1951 (70 y.o. Lorette Ang, Meta.Reding Primary Care Chavy Avera: Leanna Battles Other Clinician: Referring Shamonique Battiste: Treating Kyrstan Gotwalt/Extender: Darrold Junker in Treatment: 4 Visit Information History Since Last Visit Added or deleted any medications: No Patient Arrived: Ambulatory Any new allergies or adverse reactions: No Arrival Time: 08:21 Had a fall or experienced change in No Accompanied By: self activities of daily living that may affect Transfer Assistance: None risk of falls: Patient Identification Verified: Yes Signs or symptoms of abuse/neglect since last visito No Secondary Verification Process Completed: Yes Hospitalized since last visit: No Patient Requires Transmission-Based Precautions: No Implantable device outside of the clinic excluding No Patient Has Alerts: No cellular tissue based products placed in the center since last visit: Has Dressing in Place as Prescribed: Yes Pain Present Now: No Electronic Signature(s) Signed: 01/08/2021 10:08:57 AM By: Sandre Kitty Entered By: Sandre Kitty on 01/08/2021 08:21:15 -------------------------------------------------------------------------------- Encounter Discharge Information Details Patient Name: Date of Service: [name], [name] 01/08/2021 8:30 A M Medical Record Number: [id number] Patient Account Number: [id number] Date of Birth/Sex: Treating RN: 04/23/1951 (71 y.o. [name] Primary Care Jasmin Trumbull: Leanna Battles Other Clinician: Referring Jasaun Carn: Treating Evetta Renner/Extender: Darrold Junker in Treatment: 4 Encounter Discharge Information Items Post Procedure Vitals Discharge  Condition: Stable Temperature (F): 98.1 Ambulatory Status: Ambulatory Pulse (bpm): 71 Discharge Destination: Home Respiratory Rate (breaths/min): 18 Transportation: Private Auto Blood Pressure (mmHg): 121/74 Schedule Follow-up Appointment: Yes Clinical Summary of Care: Provided on 01/08/2021 Form Type Recipient Paper Patient Patient Electronic Signature(s) Signed: 01/08/2021 9:29:30 AM By: Lorrin Jackson Entered By: Lorrin Jackson on 01/08/2021 09:29:30 -------------------------------------------------------------------------------- Lower Extremity Assessment Details Patient Name: Date of Service: [name], [name] 01/08/2021 8:30 A M Medical Record Number: [id number] Patient Account Number: [id number] Date of Birth/Sex: Treating RN: 1951-05-11 (70 y.o. [name] Primary Care Khair Chasteen: Leanna Battles Other Clinician: Referring Teniya Filter: Treating Kamil Hanigan/Extender: Darrold Junker in Treatment: 4 Edema Assessment Assessed: Shirlyn Goltz: Yes] Patrice Paradise: No] Edema: [Left: N] [Right: o] Calf Left: Right: Point of Measurement: 34 cm From Medial Instep 39 cm Ankle Left: Right: Point of Measurement: 12 cm From Medial Instep 24 cm Vascular Assessment Pulses: Dorsalis Pedis Palpable: [Left:Yes] Electronic Signature(s) Signed: 01/08/2021 5:10:48 PM By: Lorrin Jackson Entered By: Lorrin Jackson on 01/08/2021 08:31:19 -------------------------------------------------------------------------------- Multi Wound Chart Details Patient Name: Date of Service: [name], [name] 01/08/2021 8:30 A M Medical Record Number: [id number] Patient Account Number: [id number] Date of Birth/Sex: Treating RN: [birth year] (70 y.o. Lorette Ang, Meta.Reding Primary Care Mahamud Metts: Leanna Battles Other Clinician: Referring Carmin Alvidrez: Treating Roylene Heaton/Extender: Darrold Junker in Treatment: 4 Vital Signs Height(in): 55 Pulse(bpm): 45 Weight(lbs):  255 Blood Pressure(mmHg): 121/74 Body Mass Index(BMI): 36 Temperature(F): 98.1 Respiratory Rate(breaths/min): 18 Photos: [1:No Photos Left, Plantar T Great oe] [N/A:N/A N/A] Wound Location: [1:Trauma] [N/A:N/A] Wounding Event: [1:Diabetic Wound/Ulcer of the Lower] [N/A:N/A] Primary Etiology: [1:Extremity Deep Vein Thrombosis, Hypertension, N/A] Comorbid History: [1:Type II Diabetes, Rheumatoid Arthritis, Neuropathy 08/10/2020] [N/A:N/A] Date Acquired: [1:4] [N/A:N/A] Weeks of Treatment: [1:Open] [N/A:N/A] Wound Status: [1:0.2x0.5x0.1] [N/A:N/A] Measurements L x W x D (cm) [1:0.079] [N/A:N/A] A (cm) : rea [1:0.008] [N/A:N/A] Volume (cm) : [1:91.60%] [N/A:N/A] % Reduction in A rea: [1:91.50%] [N/A:N/A] % Reduction in Volume: [1:Grade 2] [  N/A:N/A] Classification: [1:Medium] [N/A:N/A] Exudate A mount: [1:Serosanguineous] [N/A:N/A] Exudate Type: [1:red, brown] [N/A:N/A] Exudate Color: [1:Distinct, outline attached] [N/A:N/A] Wound Margin: [1:Large (67-100%)] [N/A:N/A] Granulation A mount: [1:Red] [N/A:N/A] Granulation Quality: [1:Small (1-33%)] [N/A:N/A] Necrotic A mount: [1:Fat Layer (Subcutaneous Tissue): Yes N/A] Exposed Structures: [1:Fascia: No Tendon: No Muscle: No Joint: No Bone: No Large (67-100%)] [N/A:N/A] Epithelialization: [1:Debridement - Excisional] [N/A:N/A] Debridement: Pre-procedure Verification/Time Out 09:23 [N/A:N/A] Taken: [1:Lidocaine] [N/A:N/A] Pain Control: [1:Callus, Subcutaneous] [N/A:N/A] Tissue Debrided: [1:Skin/Subcutaneous Tissue] [N/A:N/A] Level: [1:0.1] [N/A:N/A] Debridement A (sq cm): [1:rea Curette] [N/A:N/A] Instrument: [1:Minimum] [N/A:N/A] Bleeding: [1:Pressure] [N/A:N/A] Hemostasis A chieved: [1:0] [N/A:N/A] Procedural Pain: [1:0] [N/A:N/A] Post Procedural Pain: [1:Procedure was tolerated well] [N/A:N/A] Debridement Treatment Response: [1:0.2x0.5x0.1] [N/A:N/A] Post Debridement Measurements L x W x D (cm) [1:0.008] [N/A:N/A] Post  Debridement Volume: (cm) [1:Debridement] [N/A:N/A] Treatment Notes Wound #1 (Toe Great) Wound Laterality: Plantar, Left Cleanser Wound Cleanser Discharge Instruction: Cleanse the wound with wound cleanser prior to applying a clean dressing using gauze sponges, not tissue or cotton balls. Soap and Water Discharge Instruction: May shower and wash wound with dial antibacterial soap and water prior to dressing change. Peri-Wound Care Topical Primary Dressing Hydrofera Blue Classic Foam, 2x2 in Discharge Instruction: Moisten with saline prior to applying to wound bed Secondary Dressing Woven Gauze Sponge, Non-Sterile 4x4 in Discharge Instruction: Apply over primary dressing as directed. Optifoam Non-Adhesive Dressing, 4x4 in Discharge Instruction: **Foam donut as secondary.** Apply over primary dressing as directed. Secured With 56M [place] [employer] T ape, 2x2 (in/yd) Discharge Instruction: Secure dressing with tape as directed. Conforming Stretch Gauze Bandage, Sterile 2x75 (in/in) Discharge Instruction: Secure with stretch gauze as directed. Compression Wrap Compression Stockings Add-Ons Electronic Signature(s) Signed: 01/08/2021 9:59:37 AM By: Kalman Shan DO Signed: 01/10/2021 6:00:49 PM By: Deon Pilling Entered By: Kalman Shan on 01/08/2021 09:54:21 -------------------------------------------------------------------------------- Multi-Disciplinary Care Plan Details Patient Name: Date of Service: [name], [name] 01/08/2021 8:30 A M Medical Record Number: [id number] Patient Account Number: [id number] Date of Birth/Sex: Treating RN: 1951/06/16 (70 y.o. [name], Lauren Primary Care Ambur Province: Leanna Battles Other Clinician: Referring Mialee Weyman: Treating Latroy Gaymon/Extender: Darrold Junker in Treatment: 4 Active Inactive Nutrition Nursing Diagnoses: Impaired glucose control: actual or  potential Goals: Patient/caregiver verbalizes understanding of need to maintain therapeutic glucose control per primary care physician Date Initiated: 12/07/2020 Target Resolution Date: 01/18/2021 Goal Status: Active Interventions: Assess HgA1c results as ordered upon admission and as needed Assess patient nutrition upon admission and as needed per policy Provide education on elevated blood sugars and impact on wound healing Treatment Activities: Obtain HgA1c : 12/07/2020 Notes: Wound/Skin Impairment Nursing Diagnoses: Impaired tissue integrity Goals: Patient/caregiver will verbalize understanding of skin care regimen Date Initiated: 12/07/2020 Target Resolution Date: 01/18/2021 Goal Status: Active Ulcer/skin breakdown will have a volume reduction of 30% by week 4 Date Initiated: 12/07/2020 Target Resolution Date: 01/18/2021 Goal Status: Active Interventions: Assess patient/caregiver ability to obtain necessary supplies Assess patient/caregiver ability to perform ulcer/skin care regimen upon admission and as needed Assess ulceration(s) every visit Provide education on ulcer and skin care Treatment Activities: Skin care regimen initiated : 12/07/2020 Topical wound management initiated : 12/07/2020 Notes: Electronic Signature(s) Signed: 01/08/2021 5:09:03 PM By: Rhae Hammock RN Entered By: Rhae Hammock on 01/08/2021 09:16:56 -------------------------------------------------------------------------------- Pain Assessment Details Patient Name: Date of Service: [name], [name] 01/08/2021 8:30 A M Medical Record Number: [id number] Patient Account Number: [id number] Date of Birth/Sex: Treating RN: 08-06-1951 (70 y.o. [name] Primary Care Donette Mainwaring:  Leanna Battles Other Clinician: Referring Elody Kleinsasser: Treating Liboria Putnam/Extender: Darrold Junker in Treatment: 4 Active Problems Location of Pain Severity and Description of Pain Patient  Has Paino No Site Locations Pain Management and Medication Current Pain Management: Electronic Signature(s) Signed: 01/08/2021 10:08:57 AM By: Sandre Kitty Signed: 01/10/2021 6:00:49 PM By: Deon Pilling Entered By: Sandre Kitty on 01/08/2021 08:21:36 -------------------------------------------------------------------------------- Patient/Caregiver Education Details Patient Name: Date of Service: Montez Morita, [name] 5/17/2022andnbsp8:30 A M Medical Record Number: [id number] Patient Account Number: [id number] Date of Birth/Gender: Treating RN: [birth year] (70 y.o. [name] Primary Care Physician: Leanna Battles Other Clinician: Referring Physician: Treating Physician/Extender: Darrold Junker in Treatment: 4 Education Assessment Education Provided To: Patient Education Topics Provided Elevated Blood Sugar/ Impact on Healing: Methods: Explain/Verbal Wound/Skin Impairment: Methods: Explain/Verbal Responses: State content correctly Electronic Signature(s) Signed: 01/08/2021 5:09:03 PM By: Rhae Hammock RN Entered By: Rhae Hammock on 01/08/2021 09:17:28 -------------------------------------------------------------------------------- Wound Assessment Details Patient Name: Date of Service: [name], [name] 01/08/2021 8:30 A M Medical Record Number: [id number] Patient Account Number: [id number] Date of Birth/Sex: Treating RN: 12/18/1950 (70 y.o. Lorette Ang, Meta.Reding Primary Care Treavor Blomquist: Leanna Battles Other Clinician: Referring Makaelyn Aponte: Treating Viren Lebeau/Extender: Darrold Junker in Treatment: 4 Wound Status Wound Number: 1 Primary Diabetic Wound/Ulcer of the Lower Extremity Etiology: Wound Location: Left, Plantar T Great oe Wound Open Wounding Event: Trauma Status: Date Acquired: 08/10/2020 Comorbid Deep Vein Thrombosis, Hypertension, Type II Diabetes, Weeks Of Treatment: 4 History:  Rheumatoid Arthritis, Neuropathy Clustered Wound: No Photos Wound Measurements Length: (cm) 0.2 Width: (cm) 0.5 Depth: (cm) 0.1 Area: (cm) 0.079 Volume: (cm) 0.008 % Reduction in Area: 91.6% % Reduction in Volume: 91.5% Epithelialization: Large (67-100%) Tunneling: No Undermining: No Wound Description Classification: Grade 2 Wound Margin: Distinct, outline attached Exudate Amount: Medium Exudate Type: Serosanguineous Exudate Color: red, brown Foul Odor After Cleansing: No Slough/Fibrino Yes Wound Bed Granulation Amount: Large (67-100%) Exposed Structure Granulation Quality: Red Fascia Exposed: No Necrotic Amount: Small (1-33%) Fat Layer (Subcutaneous Tissue) Exposed: Yes Necrotic Quality: Adherent Slough Tendon Exposed: No Muscle Exposed: No Joint Exposed: No Bone Exposed: No Treatment Notes Wound #1 (Toe Great) Wound Laterality: Plantar, Left Cleanser Wound Cleanser Discharge Instruction: Cleanse the wound with wound cleanser prior to applying a clean dressing using gauze sponges, not tissue or cotton balls. Soap and Water Discharge Instruction: May shower and wash wound with dial antibacterial soap and water prior to dressing change. Peri-Wound Care Topical Primary Dressing Hydrofera Blue Classic Foam, 2x2 in Discharge Instruction: Moisten with saline prior to applying to wound bed Secondary Dressing Woven Gauze Sponge, Non-Sterile 4x4 in Discharge Instruction: Apply over primary dressing as directed. Optifoam Non-Adhesive Dressing, 4x4 in Discharge Instruction: **Foam donut as secondary.** Apply over primary dressing as directed. Secured With 78M [place] [employer] T ape, 2x2 (in/yd) Discharge Instruction: Secure dressing with tape as directed. Conforming Stretch Gauze Bandage, Sterile 2x75 (in/in) Discharge Instruction: Secure with stretch gauze as directed. Compression Wrap Compression Stockings Add-Ons Electronic Signature(s) Signed:  01/09/2021 11:07:31 AM By: Sandre Kitty Signed: 01/10/2021 6:00:49 PM By: Deon Pilling Entered By: Sandre Kitty on 01/08/2021 16:13:11 -------------------------------------------------------------------------------- Vitals Details Patient Name: Date of Service: [name], [name] 01/08/2021 8:30 A M Medical Record Number: [id number] Patient Account Number: [id number] Date of Birth/Sex: Treating RN: 1950/11/28 (70 y.o. [name] Primary Care Aaditya Letizia: Leanna Battles Other Clinician: Referring Dionisios Ricci: Treating Jaeline Whobrey/Extender: Darrold Junker in Treatment: 4 Vital  Signs Time Taken: 08:21 Temperature (F): 98.1 Height (in): 71 Pulse (bpm): 71 Weight (lbs): 255 Respiratory Rate (breaths/min): 18 Body Mass Index (BMI): 35.6 Blood Pressure (mmHg): 121/74 Reference Range: 80 - 120 mg / dl Electronic Signature(s) Signed: 01/08/2021 10:08:57 AM By: Sandre Kitty Entered By: Sandre Kitty on 01/08/2021 08:21:31

## 2021-01-10 NOTE — Progress Notes (Signed)
Anthony Case, Anthony ([id number]) Visit Report for 01/08/2021 Chief Complaint Document Details Patient Name: Date of Service: [place], [name] 01/08/2021 8:30 A M Medical Record Number: [id number] Patient Account Number: [id number] Date of Birth/Sex: Treating RN: 05/16/1951 (70 y.o. [name] Primary Care Provider: Leanna Battles Other Clinician: Referring Provider: Treating Provider/Extender: Darrold Junker in Treatment: 4 Information Obtained from: Patient Chief Complaint Left great toe wound Electronic Signature(s) Signed: 01/08/2021 9:59:37 AM By: Kalman Shan DO Entered By: Kalman Shan on 01/08/2021 09:54:32 -------------------------------------------------------------------------------- Debridement Details Patient Name: Date of Service: Anthony Case, [name] 01/08/2021 8:30 A M Medical Record Number: [id number] Patient Account Number: [id number] Date of Birth/Sex: Treating RN: [birth year] (70 y.o. [name], Lauren Primary Care Provider: Leanna Battles Other Clinician: Referring Provider: Treating Provider/Extender: Darrold Junker in Treatment: 4 Debridement Performed for Assessment: Wound #1 Left,Plantar T Great oe Performed By: Physician Kalman Shan, DO Debridement Type: Debridement Severity of Tissue Pre Debridement: Fat layer exposed Level of Consciousness (Pre-procedure): Awake and Alert Pre-procedure Verification/Time Out Yes - 09:23 Taken: Start Time: 09:25 Pain Control: Lidocaine T Area Debrided (L x W): otal 0.2 (cm) x 0.5 (cm) = 0.1 (cm) Tissue and other material debrided: Viable, Non-Viable, Callus, Subcutaneous, Skin: Dermis , Skin: Epidermis Level: Skin/Subcutaneous Tissue Debridement Description: Excisional Instrument: Curette Bleeding: Minimum Hemostasis Achieved: Pressure End Time: 09:25 Procedural Pain: 0 Post Procedural Pain: 0 Response to Treatment: Procedure was  tolerated well Level of Consciousness (Post- Awake and Alert procedure): Post Debridement Measurements of Total Wound Length: (cm) 0.2 Width: (cm) 0.5 Depth: (cm) 0.1 Volume: (cm) 0.008 Character of Wound/Ulcer Post Debridement: Improved Severity of Tissue Post Debridement: Fat layer exposed Post Procedure Diagnosis Same as Pre-procedure Electronic Signature(s) Signed: 01/08/2021 9:59:37 AM By: Kalman Shan DO Signed: 01/08/2021 5:09:03 PM By: Rhae Hammock RN Entered By: Rhae Hammock on 01/08/2021 09:24:43 -------------------------------------------------------------------------------- HPI Details Patient Name: Date of Service: Anthony Case, [name] 01/08/2021 8:30 A M Medical Record Number: [id number] Patient Account Number: [id number] Date of Birth/Sex: Treating RN: August 05, 1951 (70 y.o. 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 He presents to the clinic today for a left great toe wound. He states that he stepped on something sharp that sliced the top part of his left great toe back in December 2021. He has been evaluated by his primary care physician and reports taking 2 rounds of antibiotics. He recently saw podiatry and the toe was debrided and padded. He is not currently doing any dressing changes to the wound. He denies increased warmth or erythema to the foot. He denies fever/chills, purulent drainage or nausea/vomiting. 12/13/2020: Patient presents for 1 week follow-up. He was evaluated last week for his left great toe ulcer and started on Hydrofera Blue. I sent him for ABIs with  TBI's and he had this done. He also started using a surgical shoe to help with pressure relief on the great toe. He has tolerated this well. He states he saw his podiatrist yesterday for follow-up. They Have referred him to see Dr. Earleen Newport. They are also planning to do a skin graft to the wound that if it has not resolved in the next  month. Overall patient states he feels well and has no complaints today. 5/5; patient presents for 1 week follow-up. He reports no issues today. He has been using Hydrofera Blue. He states he saw Dr. Earleen Newport and had a Imaging of his lower extremities. He states he feels well overall. He is going on a trip to [place] in 1-2 weeks. 5/17; patient presents for follow-up. Its been almost 2 weeks since he was last seen. He has been using Hydrofera Blue every other day. He overall feels well and denies signs of infection. He is leaving for a month-long trip to [place] tomorrow. Electronic Signature(s) Signed: 01/08/2021 9:59:37 AM By: Kalman Shan DO Entered By: Kalman Shan on 01/08/2021 09:55:27 -------------------------------------------------------------------------------- Physical Exam Details Patient Name: Date of Service: Anthony Case, [name] 01/08/2021 8:30 A M Medical Record Number: [id number] Patient Account Number: [id number] Date of Birth/Sex: Treating RN: 1951-08-05 (70 y.o. [name] Primary Care Provider: Leanna Battles Other Clinician: Referring Provider: Treating Provider/Extender: Darrold Junker in Treatment: 4 Constitutional respirations regular, non-labored and within target range for patient.. Cardiovascular 2+ dorsalis pedis/posterior tibialis pulses. Psychiatric pleasant and cooperative. Notes Left foot: Plantar great toe has an open wound surrounded by callus and nonviable tissue. The surrounding wound bed also had maceration. Post debridement there was excellent granulation tissue present. No signs of  infection. Electronic Signature(s) Signed: 01/08/2021 9:59:37 AM By: Kalman Shan DO Entered By: Kalman Shan on 01/08/2021 09:56:42 -------------------------------------------------------------------------------- Physician Orders Details Patient Name: Date of Service: Anthony Case, [name] 01/08/2021 8:30 A M Medical Record Number: [id number] Patient Account Number: [id number] Date of Birth/Sex: Treating RN: 08-06-1951 (70 y.o. [name], Lauren Primary Care Provider: Leanna Battles Other Clinician: Referring Provider: Treating Provider/Extender: Darrold Junker in Treatment: 4 Verbal / Phone Orders: No Diagnosis Coding ICD-10 Coding Code Description E11.621 Type 2 diabetes mellitus with foot ulcer E11.40 Type 2 diabetes mellitus with diabetic neuropathy, unspecified Z86.718 Personal history of other venous thrombosis and embolism I10 Essential (primary) hypertension Follow-up Appointments Return appointment in 1 month. - with Dr. Heber [place]!! Bathing/ Shower/ Hygiene May shower with protection but do not get wound dressing(s) wet. - use cast protector when not changing the dressing. May shower and wash wound with soap and water. - with dressing changes only. Edema Control - Lymphedema / SCD / Other Elevate legs to the level of the heart or above for 30 minutes daily and/or when sitting, a frequency of: - throughout the day. Avoid standing for long periods of time. Moisturize legs daily. - both legs every night before bed. Wound Treatment Wound #1 - T Great oe Wound Laterality: Plantar, Left Cleanser: Soap and Water Every Other Day/15 Days Discharge Instructions: May shower and wash wound with dial antibacterial soap and water prior to dressing change. Cleanser: Wound Cleanser (Generic) Every Other Day/15 Days Discharge Instructions: Cleanse the wound with wound cleanser prior to applying a clean dressing using gauze sponges, not tissue or  cotton balls. Prim Dressing: Hydrofera Blue Classic Foam, 2x2 in (Generic) Every Other Day/15 Days ary Discharge Instructions: Moisten with saline prior to applying to wound bed Secondary Dressing: Woven Gauze Sponge, Non-Sterile 4x4 in (Generic) Every Other Day/15 Days Discharge Instructions: Apply over primary dressing as directed. Secondary Dressing: Optifoam Non-Adhesive Dressing, 4x4 in (Generic) Every Other Day/15 Days Discharge Instructions: **Foam donut as secondary.** Apply over primary dressing as directed. Secured With: Child psychotherapist, Sterile 2x75 (in/in) (Generic) Every Other Day/15 Days Discharge Instructions:  Secure with stretch gauze as directed. Secured With: 29M Medipore H Soft Cloth Surgical Tape, 2x2 (in/yd) (Generic) Every Other Day/15 Days Discharge Instructions: Secure dressing with tape as directed. Electronic Signature(s) Signed: 01/08/2021 9:59:37 AM By: Kalman Shan DO Entered By: Kalman Shan on 01/08/2021 09:57:03 -------------------------------------------------------------------------------- Problem List Details Patient Name: Date of Service: Anthony Case, [name] 01/08/2021 8:30 A M Medical Record Number: [id number] Patient Account Number: [id number] Date of Birth/Sex: Treating RN: 25-Jul-1951 (70 y.o. [name] Primary Care Provider: Leanna Battles Other Clinician: Referring Provider: Treating Provider/Extender: Darrold Junker in Treatment: 4 Active Problems ICD-10 Encounter Code Description Active Date MDM Diagnosis E11.621 Type 2 diabetes mellitus with foot ulcer 12/07/2020 No Yes E11.40 Type 2 diabetes mellitus with diabetic neuropathy, unspecified 12/07/2020 No Yes Z86.718 Personal history of other venous thrombosis and embolism 12/07/2020 No Yes I10 Essential (primary) hypertension 12/07/2020 No Yes Inactive Problems Resolved Problems Electronic Signature(s) Signed: 01/08/2021 9:59:37 AM  By: Kalman Shan DO Entered By: Kalman Shan on 01/08/2021 09:54:12 -------------------------------------------------------------------------------- Progress Note Details Patient Name: Date of Service: Anthony Case, [name] 01/08/2021 8:30 A M Medical Record Number: [id number] Patient Account Number: [id number] Date of Birth/Sex: Treating RN: [birth year] (70 y.o. 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 He presents to the clinic today for a left great toe wound. He states that he stepped on something sharp that sliced the top part of his left great toe back in December 2021. He has been evaluated by his primary care physician and reports taking 2 rounds of antibiotics. He recently saw podiatry and the toe was debrided and padded. He is not currently doing any dressing changes to the wound. He denies increased warmth or erythema to the foot. He denies fever/chills, purulent drainage or nausea/vomiting. 12/13/2020: Patient presents for 1 week follow-up. He was evaluated last week for his left great toe ulcer and started on Hydrofera Blue. I sent him for ABIs with TBI's and he had this done. He also started using a surgical shoe to help with pressure relief on the great toe. He has tolerated this well. He states he saw his podiatrist yesterday for follow-up. They Have referred him to see Dr. Earleen Newport. They are also planning to do a skin graft to the wound that if it has not resolved in the next month. Overall patient states he feels  well and has no complaints today. 5/5; patient presents for 1 week follow-up. He reports no issues today. He has been using Hydrofera Blue. He states he saw Dr. Earleen Newport and had a Imaging of his lower extremities. He states he feels well overall. He is going on a trip to [place] in 1-2 weeks. 5/17; patient presents for follow-up. Its been almost 2 weeks since he was last seen. He has been using Hydrofera Blue every other day. He overall feels well and denies signs of infection. He is leaving for a month-long trip to [place] tomorrow. Patient History Information obtained from Patient. Family History Heart Disease, No family history of Cancer, Diabetes, Hereditary Spherocytosis, Hypertension, Kidney Disease, Lung Disease, Seizures, Stroke, Thyroid Problems, Tuberculosis. Social History Never smoker, Marital  Status - Married, Alcohol Use - Rarely - prior history, Drug Use - No History, Caffeine Use - Rarely. Medical History Eyes Denies history of Cataracts, Glaucoma, Optic Neuritis Ear/Nose/Mouth/Throat Denies history of Chronic sinus problems/congestion, Middle ear problems Hematologic/Lymphatic Denies history of Anemia, Hemophilia, Human Immunodeficiency Virus, Lymphedema, Sickle Cell Disease Respiratory Denies history of Aspiration, Asthma, Chronic Obstructive Pulmonary Disease (COPD), Pneumothorax, Sleep Apnea, Tuberculosis Cardiovascular Patient has history of Deep Vein Thrombosis - hx DVT Hypertension , Denies history of Angina, Arrhythmia, Congestive Heart Failure, Coronary Artery Disease, Hypotension, Myocardial Infarction, Peripheral Arterial Disease, Peripheral Venous Disease, Phlebitis, Vasculitis Gastrointestinal Denies history of Cirrhosis , Colitis, Crohnoos, Hepatitis A, Hepatitis B, Hepatitis C Endocrine Patient has history of Type II Diabetes Denies history of Type I Diabetes Genitourinary Denies history of End Stage Renal Disease Immunological Denies history of Lupus  Erythematosus, Raynaudoos, Scleroderma Integumentary (Skin) Denies history of History of Burn Musculoskeletal Patient has history of Rheumatoid Arthritis Denies history of Gout, Osteoarthritis, Osteomyelitis Neurologic Patient has history of Neuropathy Denies history of Dementia, Quadriplegia, Paraplegia, Seizure Disorder Oncologic Denies history of Received Chemotherapy, Received Radiation Hospitalization/Surgery History - spine surgery. Medical A Surgical History Notes nd Eyes DM retinopathy Objective Constitutional respirations regular, non-labored and within target range for patient.. Vitals Time Taken: 8:21 AM, Height: 71 in, Weight: 255 lbs, BMI: 35.6, Temperature: 98.1 F, Pulse: 71 bpm, Respiratory Rate: 18 breaths/min, Blood Pressure: 121/74 mmHg. Cardiovascular 2+ dorsalis pedis/posterior tibialis pulses. Psychiatric pleasant and cooperative. General Notes: Left foot: Plantar great toe has an open wound surrounded by callus and nonviable tissue. The surrounding wound bed also had maceration. Post debridement there was excellent granulation tissue present. No signs of infection. Integumentary (Hair, Skin) Wound #1 status is Open. Original cause of wound was Trauma. The date acquired was: 08/10/2020. The wound has been in treatment 4 weeks. The wound is located on the [employer]. The wound measures 0.2cm length x 0.5cm width x 0.1cm depth; 0.079cm^2 area and 0.008cm^3 volume. There is Fat oe Layer (Subcutaneous Tissue) exposed. There is no tunneling or undermining noted. There is a medium amount of serosanguineous drainage noted. The wound margin is distinct with the outline attached to the wound base. There is large (67-100%) red granulation within the wound bed. There is a small (1-33%) amount of necrotic tissue within the wound bed including Adherent Slough. Assessment Active Problems ICD-10 Type 2 diabetes mellitus with foot ulcer Type 2 diabetes mellitus  with diabetic neuropathy, unspecified Personal history of other venous thrombosis and embolism Essential (primary) hypertension Patient's wound has shown improvement in appearance and size since last clinic visit. He did have maceration around the wound bed along with nonviable tissue and callus. This was debrided today. No signs of infection and excellent granulation tissue present postdebridement. I would like for him to change the [facility] every day instead of every other day. He bought some walking sandals that I think will help with relieving the pressure to the tip of the Toe. He will be back in 1 month and I will see him then. Procedures Wound #1 Pre-procedure diagnosis of Wound #1 is a Diabetic Wound/Ulcer of the Lower Extremity located on the Left,Plantar T Great .Severity of Tissue Pre oe Debridement is: Fat layer exposed. There was a Excisional Skin/Subcutaneous Tissue Debridement with a total area of 0.1 sq cm performed by Kalman Shan, DO. With the following instrument(s): Curette to remove Viable and Non-Viable tissue/material. Material removed includes Callus, Subcutaneous Tissue, Skin: Dermis, and Skin: Epidermis after  achieving pain control using Lidocaine. No specimens were taken. A time out was conducted at 09:23, prior to the start of the procedure. A Minimum amount of bleeding was controlled with Pressure. The procedure was tolerated well with a pain level of 0 throughout and a pain level of 0 following the procedure. Post Debridement Measurements: 0.2cm length x 0.5cm width x 0.1cm depth; 0.008cm^3 volume. Character of Wound/Ulcer Post Debridement is improved. Severity of Tissue Post Debridement is: Fat layer exposed. Post procedure Diagnosis Wound #1: Same as Pre-Procedure Plan Follow-up Appointments: Return appointment in 1 month. - with Dr. Heber [place]!! Bathing/ Shower/ Hygiene: May shower with protection but do not get wound dressing(s) wet. - use cast protector  when not changing the dressing. May shower and wash wound with soap and water. - with dressing changes only. Edema Control - Lymphedema / SCD / Other: Elevate legs to the level of the heart or above for 30 minutes daily and/or when sitting, a frequency of: - throughout the day. Avoid standing for long periods of time. Moisturize legs daily. - both legs every night before bed. WOUND #1: - T Great Wound Laterality: Plantar, Left oe Cleanser: Soap and Water Every Other Day/15 Days Discharge Instructions: May shower and wash wound with dial antibacterial soap and water prior to dressing change. Cleanser: Wound Cleanser (Generic) Every Other Day/15 Days Discharge Instructions: Cleanse the wound with wound cleanser prior to applying a clean dressing using gauze sponges, not tissue or cotton balls. Prim Dressing: Hydrofera Blue Classic Foam, 2x2 in (Generic) Every Other Day/15 Days ary Discharge Instructions: Moisten with saline prior to applying to wound bed Secondary Dressing: Woven Gauze Sponge, Non-Sterile 4x4 in (Generic) Every Other Day/15 Days Discharge Instructions: Apply over primary dressing as directed. Secondary Dressing: Optifoam Non-Adhesive Dressing, 4x4 in (Generic) Every Other Day/15 Days Discharge Instructions: **Foam donut as secondary.** Apply over primary dressing as directed. Secured With: Child psychotherapist, Sterile 2x75 (in/in) (Generic) Every Other Day/15 Days Discharge Instructions: Secure with stretch gauze as directed. Secured With: 48M Medipore H Soft Cloth Surgical T ape, 2x2 (in/yd) (Generic) Every Other Day/15 Days Discharge Instructions: Secure dressing with tape as directed. 1. Hydrofera Blue every other day 2. Offloading 3. In office sharp debridement Electronic Signature(s) Signed: 01/08/2021 9:59:37 AM By: Kalman Shan DO Entered By: Kalman Shan on 01/08/2021  09:59:00 -------------------------------------------------------------------------------- HxROS Details Patient Name: Date of Service: Anthony Case, [name] 01/08/2021 8:30 A M Medical Record Number: [id number] Patient Account Number: [id number] Date of Birth/Sex: Treating RN: 05-11-1951 (70 y.o. [name] Primary Care Provider: Leanna Battles Other Clinician: Referring Provider: Treating Provider/Extender: Darrold Junker in Treatment: 4 Information Obtained From Patient Eyes Medical History: Negative for: Cataracts; Glaucoma; Optic Neuritis Past Medical History Notes: DM retinopathy Ear/Nose/Mouth/Throat Medical History: Negative for: Chronic sinus problems/congestion; Middle ear problems Hematologic/Lymphatic Medical History: Negative for: Anemia; Hemophilia; Human Immunodeficiency Virus; Lymphedema; Sickle Cell Disease Respiratory Medical History: Negative for: Aspiration; Asthma; Chronic Obstructive Pulmonary Disease (COPD); Pneumothorax; Sleep Apnea; Tuberculosis Cardiovascular Medical History: Positive for: Deep Vein Thrombosis - hx DVT Hypertension ; Negative for: Angina; Arrhythmia; Congestive Heart Failure; Coronary Artery Disease; Hypotension; Myocardial Infarction; Peripheral Arterial Disease; Peripheral Venous Disease; Phlebitis; Vasculitis Gastrointestinal Medical History: Negative for: Cirrhosis ; Colitis; Crohns; Hepatitis A; Hepatitis B; Hepatitis C Endocrine Medical History: Positive for: Type II Diabetes Negative for: Type I Diabetes Treated with: Insulin, Oral agents Blood sugar tested every day: No Genitourinary Medical History: Negative for: End Stage Renal Disease  Immunological Medical History: Negative for: Lupus Erythematosus; Raynauds; Scleroderma Integumentary (Skin) Medical History: Negative for: History of Burn Musculoskeletal Medical History: Positive for: Rheumatoid Arthritis Negative for: Gout;  Osteoarthritis; Osteomyelitis Neurologic Medical History: Positive for: Neuropathy Negative for: Dementia; Quadriplegia; Paraplegia; Seizure Disorder Oncologic Medical History: Negative for: Received Chemotherapy; Received Radiation Immunizations Pneumococcal Vaccine: Received Pneumococcal Vaccination: No Implantable Devices None Hospitalization / Surgery History Type of Hospitalization/Surgery spine surgery Family and Social History Cancer: No; Diabetes: No; Heart Disease: Yes; Hereditary Spherocytosis: No; Hypertension: No; Kidney Disease: No; Lung Disease: No; Seizures: No; Stroke: No; Thyroid Problems: No; Tuberculosis: No; Never smoker; Marital Status - Married; Alcohol Use: Rarely - prior history; Drug Use: No History; Caffeine Use: Rarely; Financial Concerns: No; Food, Clothing or Shelter Needs: No; Support System Lacking: No; Transportation Concerns: No Electronic Signature(s) Signed: 01/08/2021 9:59:37 AM By: Kalman Shan DO Signed: 01/10/2021 6:00:49 PM By: Deon Pilling Entered By: Kalman Shan on 01/08/2021 09:55:35 -------------------------------------------------------------------------------- SuperBill Details Patient Name: Date of Service: Anthony Case, [name] 01/08/2021 Medical Record Number: [id number] Patient Account Number: [id number] Date of Birth/Sex: Treating RN: 06/13/1951 (70 y.o. [name] Primary Care Provider: Leanna Battles Other Clinician: Referring Provider: Treating Provider/Extender: Darrold Junker in Treatment: 4 Diagnosis Coding ICD-10 Codes Code Description [phone] Type 2 diabetes mellitus with foot ulcer E11.40 Type 2 diabetes mellitus with diabetic neuropathy, unspecified Z86.718 Personal history of other venous thrombosis and embolism I10 Essential (primary) hypertension Facility Procedures CPT4 Code: [id number] Description: 20947 - DEB SUBQ TISSUE 20 SQ CM/< ICD-10 Diagnosis Description  E11.621 Type 2 diabetes mellitus with foot ulcer Modifier: Quantity: 1 Physician Procedures : CPT4 Code Description Modifier [id number] - WC PHYS SUBQ TISS 20 SQ CM ICD-10 Diagnosis Description E11.621 Type 2 diabetes mellitus with foot ulcer Quantity: 1 Electronic Signature(s) Signed: 01/08/2021 9:59:37 AM By: Kalman Shan DO Entered By: Kalman Shan on 01/08/2021 09:59:12

## 2021-02-05 ENCOUNTER — Encounter (HOSPITAL_BASED_OUTPATIENT_CLINIC_OR_DEPARTMENT_OTHER): Payer: Medicare Other | Admitting: Internal Medicine

## 2021-02-06 ENCOUNTER — Ambulatory Visit: Payer: Medicare Other | Admitting: Podiatry

## 2021-02-12 ENCOUNTER — Encounter (HOSPITAL_BASED_OUTPATIENT_CLINIC_OR_DEPARTMENT_OTHER): Payer: Medicare Other | Attending: Internal Medicine | Admitting: Internal Medicine

## 2021-02-12 ENCOUNTER — Other Ambulatory Visit: Payer: Self-pay

## 2021-02-12 DIAGNOSIS — Z7901 Long term (current) use of anticoagulants: Secondary | ICD-10-CM | POA: Diagnosis not present

## 2021-02-12 DIAGNOSIS — E1140 Type 2 diabetes mellitus with diabetic neuropathy, unspecified: Secondary | ICD-10-CM

## 2021-02-12 DIAGNOSIS — I10 Essential (primary) hypertension: Secondary | ICD-10-CM | POA: Insufficient documentation

## 2021-02-12 DIAGNOSIS — Z794 Long term (current) use of insulin: Secondary | ICD-10-CM | POA: Diagnosis not present

## 2021-02-12 DIAGNOSIS — M069 Rheumatoid arthritis, unspecified: Secondary | ICD-10-CM | POA: Insufficient documentation

## 2021-02-12 DIAGNOSIS — Z86718 Personal history of other venous thrombosis and embolism: Secondary | ICD-10-CM | POA: Insufficient documentation

## 2021-02-12 DIAGNOSIS — E1142 Type 2 diabetes mellitus with diabetic polyneuropathy: Secondary | ICD-10-CM | POA: Insufficient documentation

## 2021-02-12 DIAGNOSIS — E11621 Type 2 diabetes mellitus with foot ulcer: Secondary | ICD-10-CM | POA: Insufficient documentation

## 2021-02-12 NOTE — Progress Notes (Signed)
JAMARKIS, BRANAM ([id number]) Visit Report for 02/12/2021 Chief Complaint Document Details Patient Name: Date of Service: [place], [name] 02/12/2021 8:30 A M Medical Record Number: [id number] Patient Account Number: [id number] Date of Birth/Sex: Treating RN: 1951-03-12 (70 y.o. [name] Primary Care Provider: Leanna Battles Other Clinician: Referring Provider: Treating Provider/Extender: Darrold Junker in Treatment: 9 Information Obtained from: Patient Chief Complaint Left great toe wound Electronic Signature(s) Signed: 02/12/2021 9:59:14 AM By: Kalman Shan DO Entered By: Kalman Shan on 02/12/2021 09:53:10 -------------------------------------------------------------------------------- Debridement Details Patient Name: Date of Service: [name], [name] 02/12/2021 8:30 A M Medical Record Number: [id number] Patient Account Number: [id number] Date of Birth/Sex: Treating RN: 06-20-1951 (70 y.o. [name], Lauren Primary Care Provider: Leanna Battles Other Clinician: Referring Provider: Treating Provider/Extender: Darrold Junker in Treatment: 9 Debridement Performed for Assessment: Wound #1 Left,Plantar T Great oe Performed By: Physician Kalman Shan, DO Debridement Type: Debridement Severity of Tissue Pre Debridement: Fat layer exposed Level of Consciousness (Pre-procedure): Awake and Alert Pre-procedure Verification/Time Out Yes - 09:33 Taken: Start Time: 09:33 Pain Control: Lidocaine T Area Debrided (L x W): otal 0.3 (cm) x 0.4 (cm) = 0.12 (cm) Tissue and other material debrided: Viable, Non-Viable, Callus, Subcutaneous, Skin: Dermis , Skin: Epidermis Level: Skin/Subcutaneous Tissue Debridement Description: Excisional Instrument: Curette Bleeding: Minimum Hemostasis Achieved: Pressure End Time: 09:34 Procedural Pain: 0 Post Procedural Pain: 0 Response to Treatment: Procedure  was tolerated well Level of Consciousness (Post- Awake and Alert procedure): Post Debridement Measurements of Total Wound Length: (cm) 0.3 Width: (cm) 0.4 Depth: (cm) 0.1 Volume: (cm) 0.009 Character of Wound/Ulcer Post Debridement: Improved Severity of Tissue Post Debridement: Fat layer exposed Post Procedure Diagnosis Same as Pre-procedure Electronic Signature(s) Signed: 02/12/2021 9:59:14 AM By: Kalman Shan DO Signed: 02/12/2021 5:26:08 PM By: Rhae Hammock RN Entered By: Rhae Hammock on 02/12/2021 09:34:02 -------------------------------------------------------------------------------- HPI Details Patient Name: Date of Service: [name], [name] 02/12/2021 8:30 A M Medical Record Number: [id number] Patient Account Number: [id number] Date of Birth/Sex: Treating RN: 01-12-1951 (70 y.o. 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 He presents to the clinic today for a left great toe wound. He states that he stepped on something sharp that sliced the top part of his left great toe back in December 2021. He has been evaluated by his primary care physician and reports taking 2 rounds of antibiotics. He recently saw podiatry and the toe was debrided and padded. He is not currently doing any dressing changes to the wound. He denies increased warmth or erythema to the foot. He denies fever/chills, purulent drainage or nausea/vomiting. 12/13/2020: Patient presents for 1 week follow-up. He was evaluated last week for his left great toe ulcer and started on Hydrofera Blue. I sent him for ABIs with  TBI's and he had this done. He also started using a surgical shoe to help with pressure relief on the great toe. He has tolerated this well. He states he saw his podiatrist yesterday for follow-up. They Have referred him to see Dr. Earleen Newport. They are also planning to do a skin graft to the wound that if it has not resolved in the next  month. Overall patient states he feels well and has no complaints today. 5/5; patient presents for 1 week follow-up. He reports no issues today. He has been using Hydrofera Blue. He states he saw Dr. Earleen Newport and had a Imaging of his lower extremities. He states he feels well overall. He is going on a trip to [place] in 1-2 weeks. 5/17; patient presents for follow-up. Its been almost 2 weeks since he was last seen. He has been using Hydrofera Blue every other day. He overall feels well and denies signs of infection. He is leaving for a month-long trip to [place] tomorrow. 6/21; patient presents for 1 month follow-up. He recently went on a 1 month trip to [place]. He had a relaxing and enjoyable time. He has been using Hydrofera Blue daily on the wound. He recently ran out of supplies and has been keeping it covered with a Band-Aid. He denies signs of infection and has no issues or complaints today. Electronic Signature(s) Signed: 02/12/2021 9:59:14 AM By: Kalman Shan DO Entered By: Kalman Shan on 02/12/2021 09:54:10 -------------------------------------------------------------------------------- Physical Exam Details Patient Name: Date of Service: [name], [name] 02/12/2021 8:30 A M Medical Record Number: [id number] Patient Account Number: [id number] Date of Birth/Sex: Treating RN: [birth year] (70 y.o. [name] Primary Care Provider: Leanna Battles Other Clinician: Referring Provider: Treating Provider/Extender: Darrold Junker in Treatment: 9 Constitutional respirations regular, non-labored and within target range for  patient.. Cardiovascular 2+ dorsalis pedis/posterior tibialis pulses. Psychiatric pleasant and cooperative. Notes Left foot: Plantar great toe has an open wound surrounded by callus and nonviable tissue. No signs of infection. Electronic Signature(s) Signed: 02/12/2021 9:59:14 AM By: Kalman Shan DO Entered By: Kalman Shan on 02/12/2021 09:54:53 -------------------------------------------------------------------------------- Physician Orders Details Patient Name: Date of Service: [name], [name] 02/12/2021 8:30 A M Medical Record Number: [id number] Patient Account Number: [id number] Date of Birth/Sex: Treating RN: 05-01-1951 (70 y.o. [name], Lauren Primary Care Provider: Leanna Battles Other Clinician: Referring Provider: Treating Provider/Extender: Darrold Junker in Treatment: 9 Verbal / Phone Orders: No Diagnosis Coding ICD-10 Coding Code Description E11.621 Type 2 diabetes mellitus with foot ulcer E11.40 Type 2 diabetes mellitus with diabetic neuropathy, unspecified Z86.718 Personal history of other venous thrombosis and embolism I10 Essential (primary) hypertension Follow-up Appointments Return Appointment in 1 week. Bathing/ Shower/ Hygiene May shower with protection but do not get wound dressing(s) wet. - use cast protector when not changing the dressing. May shower and wash wound with soap and water. - with dressing changes only. Edema Control - Lymphedema / SCD / Other Elevate legs to the level of the heart or above for 30 minutes daily and/or when sitting, a frequency of: - throughout the day. Avoid standing for long periods of time. Moisturize legs daily. - both legs every night before bed. Wound Treatment Wound #1 - T Great oe Wound Laterality: Plantar, Left Cleanser: Soap and Water Every Other Day/15 Days Discharge Instructions: May shower and wash wound with dial antibacterial soap and water prior to dressing  change. Cleanser: Wound Cleanser (Generic) Every Other Day/15 Days Discharge Instructions: Cleanse the wound with wound cleanser prior to applying a clean dressing using gauze sponges, not tissue or cotton balls. Prim Dressing: Hydrofera Blue Classic Foam, 2x2 in (DME) (Generic) Every Other Day/15 Days ary Discharge Instructions: Moisten with saline prior to applying to wound bed Secondary Dressing: Woven Gauze Sponge, Non-Sterile 4x4 in (Generic) Every Other Day/15 Days Discharge Instructions: Apply over primary  dressing as directed. Secondary Dressing: Optifoam Non-Adhesive Dressing, 4x4 in (Generic) Every Other Day/15 Days Discharge Instructions: **Foam donut as secondary.** Apply over primary dressing as directed. Secured With: Child psychotherapist, Sterile 2x75 (in/in) (DME) (Generic) Every Other Day/15 Days Discharge Instructions: Secure with stretch gauze as directed. Secured With: 50M Medipore H Soft Cloth Surgical Tape, 2x2 (in/yd) (DME) (Generic) Every Other Day/15 Days Discharge Instructions: Secure dressing with tape as directed. Electronic Signature(s) Signed: 02/12/2021 9:59:14 AM By: Kalman Shan DO Entered By: Kalman Shan on 02/12/2021 09:55:08 -------------------------------------------------------------------------------- Problem List Details Patient Name: Date of Service: [name], [name] 02/12/2021 8:30 A M Medical Record Number: [id number] Patient Account Number: [id number] Date of Birth/Sex: Treating RN: [birth year] (70 y.o. [name] Primary Care Provider: Leanna Battles Other Clinician: Referring Provider: Treating Provider/Extender: Darrold Junker in Treatment: 9 Active Problems ICD-10 Encounter Code Description Active Date MDM Diagnosis E11.621 Type 2 diabetes mellitus with foot ulcer 12/07/2020 No Yes E11.40 Type 2 diabetes mellitus with diabetic neuropathy, unspecified 12/07/2020 No Yes Z86.718  Personal history of other venous thrombosis and embolism 12/07/2020 No Yes I10 Essential (primary) hypertension 12/07/2020 No Yes Inactive Problems Resolved Problems Electronic Signature(s) Signed: 02/12/2021 9:59:14 AM By: Kalman Shan DO Entered By: Kalman Shan on 02/12/2021 09:52:52 -------------------------------------------------------------------------------- Progress Note Details Patient Name: Date of Service: [name], [name] 02/12/2021 8:30 A M Medical Record Number: [id number] Patient Account Number: [id number] Date of Birth/Sex: Treating RN: Dec 19, 1950 (70 y.o. 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 He presents to the clinic today for a left great toe wound. He states that he stepped on something sharp that sliced the top part of his left great toe back in December 2021. He has been evaluated by his primary care physician and reports taking 2 rounds of antibiotics. He recently saw podiatry and the toe was debrided and padded. He is not currently doing any dressing changes to the wound. He denies increased warmth or erythema to the foot. He denies fever/chills, purulent drainage or nausea/vomiting. 12/13/2020: Patient presents for 1 week follow-up. He was evaluated last week for his left great toe ulcer and started on Hydrofera Blue. I sent him for ABIs with TBI's and he had this done. He also started using a surgical shoe to help with pressure relief on the great toe. He has tolerated this well. He states he  saw his podiatrist yesterday for follow-up. They Have referred him to see Dr. Earleen Newport. They are also planning to do a skin graft to the wound that if it has not resolved in the next month. Overall patient states he feels well and has no complaints today. 5/5; patient presents for 1 week follow-up. He reports no issues today. He has been using Hydrofera Blue. He states he saw Dr. Earleen Newport and had a Imaging of his lower extremities. He states he feels well overall. He is going on a trip to [place] in 1-2 weeks. 5/17; patient presents for follow-up. Its been almost 2 weeks since he was last seen. He has been using Hydrofera Blue every other day. He overall feels well and denies signs  of infection. He is leaving for a month-long trip to [place] tomorrow. 6/21; patient presents for 1 month follow-up. He recently went on a 1 month trip to [place]. He had a relaxing and enjoyable time. He has been using Hydrofera Blue daily on the wound. He recently ran out of supplies and has been keeping it covered with a Band-Aid. He denies signs of infection and has no issues or complaints today. Patient History Information obtained from Patient. Family History Heart Disease, No family history of Cancer, Diabetes, Hereditary Spherocytosis, Hypertension, Kidney Disease, Lung Disease, Seizures, Stroke, Thyroid Problems, Tuberculosis. Social History Never smoker, Marital Status - Married, Alcohol Use - Rarely - prior history, Drug Use - No History, Caffeine Use - Rarely. Medical History Eyes Denies history of Cataracts, Glaucoma, Optic Neuritis Ear/Nose/Mouth/Throat Denies history of Chronic sinus problems/congestion, Middle ear problems Hematologic/Lymphatic Denies history of Anemia, Hemophilia, Human Immunodeficiency Virus, Lymphedema, Sickle Cell Disease Respiratory Denies history of Aspiration, Asthma, Chronic Obstructive Pulmonary Disease (COPD), Pneumothorax, Sleep Apnea, Tuberculosis Cardiovascular Patient has  history of Deep Vein Thrombosis - hx DVT Hypertension , Denies history of Angina, Arrhythmia, Congestive Heart Failure, Coronary Artery Disease, Hypotension, Myocardial Infarction, Peripheral Arterial Disease, Peripheral Venous Disease, Phlebitis, Vasculitis Gastrointestinal Denies history of Cirrhosis , Colitis, Crohnoos, Hepatitis A, Hepatitis B, Hepatitis C Endocrine Patient has history of Type II Diabetes Denies history of Type I Diabetes Genitourinary Denies history of End Stage Renal Disease Immunological Denies history of Lupus Erythematosus, Raynaudoos, Scleroderma Integumentary (Skin) Denies history of History of Burn Musculoskeletal Patient has history of Rheumatoid Arthritis Denies history of Gout, Osteoarthritis, Osteomyelitis Neurologic Patient has history of Neuropathy Denies history of Dementia, Quadriplegia, Paraplegia, Seizure Disorder Oncologic Denies history of Received Chemotherapy, Received Radiation Hospitalization/Surgery History - spine surgery. Medical A Surgical History Notes nd Eyes DM retinopathy Objective Constitutional respirations regular, non-labored and within target range for patient.. Vitals Time Taken: 8:43 AM, Height: 71 in, Weight: 255 lbs, BMI: 35.6, Temperature: 98.3 F, Pulse: 66 bpm, Respiratory Rate: 18 breaths/min, Blood Pressure: 132/82 mmHg. Cardiovascular 2+ dorsalis pedis/posterior tibialis pulses. Psychiatric pleasant and cooperative. General Notes: Left foot: Plantar great toe has an open wound surrounded by callus and nonviable tissue. No signs of infection. Integumentary (Hair, Skin) Wound #1 status is Open. Original cause of wound was Trauma. The date acquired was: 08/10/2020. The wound has been in treatment 9 weeks. The wound is located on the [employer]. The wound measures 0.3cm length x 0.4cm width x 0.1cm depth; 0.094cm^2 area and 0.009cm^3 volume. There is Fat oe Layer (Subcutaneous Tissue) exposed. There  is no tunneling or undermining noted. There is a medium amount of serosanguineous drainage noted. The wound margin is distinct with the outline attached to the wound base. There is large (67-100%) red granulation within the wound bed. There is no necrotic tissue within the wound bed. Assessment Active Problems ICD-10 Type 2 diabetes mellitus with foot ulcer Type 2 diabetes mellitus with diabetic neuropathy, unspecified Personal history of other venous thrombosis and embolism Essential (primary) hypertension Patient's wound shows slight improvement. I debrided the callus and nonviable tissue. No signs of infection. We will continue with Hydrofera Blue. He can follow-up in 2 weeks. Procedures Wound #1 Pre-procedure diagnosis of Wound #1 is a Diabetic Wound/Ulcer of the Lower Extremity located on the Left,Plantar T Great .Severity of Tissue Pre oe Debridement is: Fat layer exposed. There was a Excisional Skin/Subcutaneous Tissue Debridement with a total area of 0.12 sq cm performed by Kalman Shan, DO. With the  following instrument(s): Curette to remove Viable and Non-Viable tissue/material. Material removed includes Callus, Subcutaneous Tissue, Skin: Dermis, and Skin: Epidermis after achieving pain control using Lidocaine. No specimens were taken. A time out was conducted at 09:33, prior to the start of the procedure. A Minimum amount of bleeding was controlled with Pressure. The procedure was tolerated well with a pain level of 0 throughout and a pain level of 0 following the procedure. Post Debridement Measurements: 0.3cm length x 0.4cm width x 0.1cm depth; 0.009cm^3 volume. Character of Wound/Ulcer Post Debridement is improved. Severity of Tissue Post Debridement is: Fat layer exposed. Post procedure Diagnosis Wound #1: Same as Pre-Procedure Plan Follow-up Appointments: Return Appointment in 1 week. Bathing/ Shower/ Hygiene: May shower with protection but do not get wound dressing(s)  wet. - use cast protector when not changing the dressing. May shower and wash wound with soap and water. - with dressing changes only. Edema Control - Lymphedema / SCD / Other: Elevate legs to the level of the heart or above for 30 minutes daily and/or when sitting, a frequency of: - throughout the day. Avoid standing for long periods of time. Moisturize legs daily. - both legs every night before bed. WOUND #1: - T Great Wound Laterality: Plantar, Left oe Cleanser: Soap and Water Every Other Day/15 Days Discharge Instructions: May shower and wash wound with dial antibacterial soap and water prior to dressing change. Cleanser: Wound Cleanser (Generic) Every Other Day/15 Days Discharge Instructions: Cleanse the wound with wound cleanser prior to applying a clean dressing using gauze sponges, not tissue or cotton balls. Prim Dressing: Hydrofera Blue Classic Foam, 2x2 in (DME) (Generic) Every Other Day/15 Days ary Discharge Instructions: Moisten with saline prior to applying to wound bed Secondary Dressing: Woven Gauze Sponge, Non-Sterile 4x4 in (Generic) Every Other Day/15 Days Discharge Instructions: Apply over primary dressing as directed. Secondary Dressing: Optifoam Non-Adhesive Dressing, 4x4 in (Generic) Every Other Day/15 Days Discharge Instructions: **Foam donut as secondary.** Apply over primary dressing as directed. Secured With: Child psychotherapist, Sterile 2x75 (in/in) (DME) (Generic) Every Other Day/15 Days Discharge Instructions: Secure with stretch gauze as directed. Secured With: 77M Medipore H Soft Cloth Surgical T ape, 2x2 (in/yd) (DME) (Generic) Every Other Day/15 Days Discharge Instructions: Secure dressing with tape as directed. 1. In office sharp debridement 2. Hydrofera Blue 3. Offloading 4. Follow-up in 2 weeks Electronic Signature(s) Signed: 02/12/2021 9:59:14 AM By: Kalman Shan DO Entered By: Kalman Shan on 02/12/2021  09:58:21 -------------------------------------------------------------------------------- HxROS Details Patient Name: Date of Service: [name], [name] 02/12/2021 8:30 A M Medical Record Number: [id number] Patient Account Number: [id number] Date of Birth/Sex: Treating RN: 14-Jun-1951 (70 y.o. [name] Primary Care Provider: Leanna Battles Other Clinician: Referring Provider: Treating Provider/Extender: Darrold Junker in Treatment: 9 Information Obtained From Patient Eyes Medical History: Negative for: Cataracts; Glaucoma; Optic Neuritis Past Medical History Notes: DM retinopathy Ear/Nose/Mouth/Throat Medical History: Negative for: Chronic sinus problems/congestion; Middle ear problems Hematologic/Lymphatic Medical History: Negative for: Anemia; Hemophilia; Human Immunodeficiency Virus; Lymphedema; Sickle Cell Disease Respiratory Medical History: Negative for: Aspiration; Asthma; Chronic Obstructive Pulmonary Disease (COPD); Pneumothorax; Sleep Apnea; Tuberculosis Cardiovascular Medical History: Positive for: Deep Vein Thrombosis - hx DVT Hypertension ; Negative for: Angina; Arrhythmia; Congestive Heart Failure; Coronary Artery Disease; Hypotension; Myocardial Infarction; Peripheral Arterial Disease; Peripheral Venous Disease; Phlebitis; Vasculitis Gastrointestinal Medical History: Negative for: Cirrhosis ; Colitis; Crohns; Hepatitis A; Hepatitis B; Hepatitis C Endocrine Medical History: Positive for: Type II Diabetes Negative for: Type  I Diabetes Treated with: Insulin, Oral agents Blood sugar tested every day: No Genitourinary Medical History: Negative for: End Stage Renal Disease Immunological Medical History: Negative for: Lupus Erythematosus; Raynauds; Scleroderma Integumentary (Skin) Medical History: Negative for: History of Burn Musculoskeletal Medical History: Positive for: Rheumatoid Arthritis Negative for: Gout;  Osteoarthritis; Osteomyelitis Neurologic Medical History: Positive for: Neuropathy Negative for: Dementia; Quadriplegia; Paraplegia; Seizure Disorder Oncologic Medical History: Negative for: Received Chemotherapy; Received Radiation Immunizations Pneumococcal Vaccine: Received Pneumococcal Vaccination: No Implantable Devices None Hospitalization / Surgery History Type of Hospitalization/Surgery spine surgery Family and Social History Cancer: No; Diabetes: No; Heart Disease: Yes; Hereditary Spherocytosis: No; Hypertension: No; Kidney Disease: No; Lung Disease: No; Seizures: No; Stroke: No; Thyroid Problems: No; Tuberculosis: No; Never smoker; Marital Status - Married; Alcohol Use: Rarely - prior history; Drug Use: No History; Caffeine Use: Rarely; Financial Concerns: No; Food, Clothing or Shelter Needs: No; Support System Lacking: No; Transportation Concerns: No Electronic Signature(s) Signed: 02/12/2021 9:59:14 AM By: Kalman Shan DO Signed: 02/12/2021 4:54:56 PM By: Deon Pilling Entered By: Kalman Shan on 02/12/2021 09:54:18 -------------------------------------------------------------------------------- SuperBill Details Patient Name: Date of Service: [name], [name] 02/12/2021 Medical Record Number: [id number] Patient Account Number: [id number] Date of Birth/Sex: Treating RN: 05-15-1951 (70 y.o. Lorette Ang, Meta.Reding Primary Care Provider: Leanna Battles Other Clinician: Referring Provider: Treating Provider/Extender: Darrold Junker in Treatment: 9 Diagnosis Coding ICD-10 Codes Code Description [phone] Type 2 diabetes mellitus with foot ulcer E11.40 Type 2 diabetes mellitus with diabetic neuropathy, unspecified Z86.718 Personal history of other venous thrombosis and embolism I10 Essential (primary) hypertension Facility Procedures CPT4 Code: [id number] Description: 35573 - DEB SUBQ TISSUE 20 SQ CM/< ICD-10 Diagnosis Description  E11.621 Type 2 diabetes mellitus with foot ulcer E11.40 Type 2 diabetes mellitus with diabetic neuropathy, unspecified Modifier: Quantity: 1 Physician Procedures : CPT4 Code Description Modifier [id number] - WC PHYS SUBQ TISS 20 SQ CM ICD-10 Diagnosis Description E11.621 Type 2 diabetes mellitus with foot ulcer E11.40 Type 2 diabetes mellitus with diabetic neuropathy, unspecified Quantity: 1 Electronic Signature(s) Signed: 02/12/2021 9:59:14 AM By: Kalman Shan DO Entered By: Kalman Shan on 02/12/2021 09:58:43

## 2021-02-12 NOTE — Progress Notes (Signed)
SAYF, KERNER ([id number]) Visit Report for 02/12/2021 Arrival Information Details Patient Name: Date of Service: [place], [name] 02/12/2021 8:30 A M Medical Record Number: [id number] Patient Account Number: [id number] Date of Birth/Sex: Treating RN: Dec 02, 1950 (70 y.o. Lorette Ang, Meta.Reding Primary Care Idrees Quam: Leanna Battles Other Clinician: Referring Sanaiya Welliver: Treating Tarl Cephas/Extender: Darrold Junker in Treatment: 9 Visit Information History Since Last Visit Added or deleted any medications: No Patient Arrived: Ambulatory Any new allergies or adverse reactions: No Arrival Time: 08:43 Had a fall or experienced change in No Accompanied By: self activities of daily living that may affect Transfer Assistance: None risk of falls: Patient Identification Verified: Yes Signs or symptoms of abuse/neglect since last visito No Secondary Verification Process Completed: Yes Hospitalized since last visit: No Patient Requires Transmission-Based Precautions: No Implantable device outside of the clinic excluding No Patient Has Alerts: No cellular tissue based products placed in the center since last visit: Has Dressing in Place as Prescribed: Yes Pain Present Now: No Electronic Signature(s) Signed: 02/12/2021 9:20:10 AM By: Sandre Kitty Entered By: Sandre Kitty on 02/12/2021 08:43:56 -------------------------------------------------------------------------------- Lower Extremity Assessment Details Patient Name: Date of Service: [place], [name] 02/12/2021 8:30 A M Medical Record Number: [id number] Patient Account Number: [id number] Date of Birth/Sex: Treating RN: 10/31/1950 (70 y.o. [name] Primary Care Tariya Morrissette: Leanna Battles Other Clinician: Referring Macon Sandiford: Treating Lilybeth Vien/Extender: Darrold Junker in Treatment: 9 Edema Assessment Assessed: Shirlyn Goltz: Yes] Patrice Paradise: No] Edema: [Left: N] [Right:  o] Calf Left: Right: Point of Measurement: 34 cm From Medial Instep 40 cm Ankle Left: Right: Point of Measurement: 12 cm From Medial Instep 24 cm Vascular Assessment Pulses: Dorsalis Pedis Palpable: [Left:Yes] Electronic Signature(s) Signed: 02/12/2021 4:28:34 PM By: Lorrin Jackson Entered By: Lorrin Jackson on 02/12/2021 08:56:07 -------------------------------------------------------------------------------- Multi Wound Chart Details Patient Name: Date of Service: [name], [name] 02/12/2021 8:30 A M Medical Record Number: [id number] Patient Account Number: [id number] Date of Birth/Sex: Treating RN: 07-27-1951 (70 y.o. Lorette Ang, Meta.Reding Primary Care Abron Neddo: Leanna Battles Other Clinician: Referring Tara Rud: Treating Equilla Que/Extender: Darrold Junker in Treatment: 9 Vital Signs Height(in): 86 Pulse(bpm): 13 Weight(lbs): 255 Blood Pressure(mmHg): 132/82 Body Mass Index(BMI): 36 Temperature(F): 98.3 Respiratory Rate(breaths/min): 18 Photos: [1:No Photos Left, Plantar T Great oe] [N/A:N/A N/A] Wound Location: [1:Trauma] [N/A:N/A] Wounding Event: [1:Diabetic Wound/Ulcer of the Lower] [N/A:N/A] Primary Etiology: [1:Extremity Deep Vein Thrombosis, Hypertension, N/A] Comorbid History: [1:Type II Diabetes, Rheumatoid Arthritis, Neuropathy 08/10/2020] [N/A:N/A] Date Acquired: [1:9] [N/A:N/A] Weeks of Treatment: [1:Open] [N/A:N/A] Wound Status: [1:0.3x0.4x0.1] [N/A:N/A] Measurements L x W x D (cm) [1:0.094] [N/A:N/A] A (cm) : rea [1:0.009] [N/A:N/A] Volume (cm) : [1:90.00%] [N/A:N/A] % Reduction in A [1:rea: 90.40%] [N/A:N/A] % Reduction in Volume: [1:Grade 2] [N/A:N/A] Classification: [1:Medium] [N/A:N/A] Exudate A mount: [1:Serosanguineous] [N/A:N/A] Exudate Type: [1:red, brown] [N/A:N/A] Exudate Color: [1:Distinct, outline attached] [N/A:N/A] Wound Margin: [1:Large (67-100%)] [N/A:N/A] Granulation A mount: [1:Red]  [N/A:N/A] Granulation Quality: [1:None Present (0%)] [N/A:N/A] Necrotic A mount: [1:Fat Layer (Subcutaneous Tissue): Yes N/A] Exposed Structures: [1:Fascia: No Tendon: No Muscle: No Joint: No Bone: No Large (67-100%)] [N/A:N/A] Epithelialization: [1:Debridement - Excisional] [N/A:N/A] Debridement: Pre-procedure Verification/Time Out 09:33 [N/A:N/A] Taken: [1:Lidocaine] [N/A:N/A] Pain Control: [1:Callus, Subcutaneous] [N/A:N/A] Tissue Debrided: [1:Skin/Subcutaneous Tissue] [N/A:N/A] Level: [1:0.12] [N/A:N/A] Debridement A (sq cm): [1:rea Curette] [N/A:N/A] Instrument: [1:Minimum] [N/A:N/A] Bleeding: [1:Pressure] [N/A:N/A] Hemostasis A chieved: [1:0] [N/A:N/A] Procedural Pain: [1:0] [N/A:N/A] Post Procedural Pain: [1:Procedure was tolerated well] [N/A:N/A] Debridement Treatment Response: [1:0.3x0.4x0.1] [N/A:N/A] Post Debridement  Measurements L x W x D (cm) [1:0.009] [N/A:N/A] Post Debridement Volume: (cm) [1:Debridement] [N/A:N/A] Procedures Performed: Treatment Notes Electronic Signature(s) Signed: 02/12/2021 9:59:14 AM By: Kalman Shan DO Signed: 02/12/2021 4:54:56 PM By: Deon Pilling Entered By: Kalman Shan on 02/12/2021 09:52:58 -------------------------------------------------------------------------------- Multi-Disciplinary Care Plan Details Patient Name: Date of Service: [name], [name] 02/12/2021 8:30 A M Medical Record Number: [id number] Patient Account Number: [id number] Date of Birth/Sex: Treating RN: 03/12/1951 (70 y.o. [name], Lauren Primary Care Ramiah Helfrich: Leanna Battles Other Clinician: Referring Porter Nakama: Treating Randal Yepiz/Extender: Darrold Junker in Treatment: 9 Active Inactive Nutrition Nursing Diagnoses: Impaired glucose control: actual or potential Goals: Patient/caregiver verbalizes understanding of need to maintain therapeutic glucose control per primary care physician Date Initiated:  12/07/2020 Target Resolution Date: 03/17/2021 Goal Status: Active Interventions: Assess HgA1c results as ordered upon admission and as needed Assess patient nutrition upon admission and as needed per policy Provide education on elevated blood sugars and impact on wound healing Treatment Activities: Obtain HgA1c : 12/07/2020 Notes: Wound/Skin Impairment Nursing Diagnoses: Impaired tissue integrity Goals: Patient/caregiver will verbalize understanding of skin care regimen Date Initiated: 12/07/2020 Target Resolution Date: 03/19/2021 Goal Status: Active Ulcer/skin breakdown will have a volume reduction of 30% by week 4 Date Initiated: 12/07/2020 Target Resolution Date: 03/19/2021 Goal Status: Active Interventions: Assess patient/caregiver ability to obtain necessary supplies Assess patient/caregiver ability to perform ulcer/skin care regimen upon admission and as needed Assess ulceration(s) every visit Provide education on ulcer and skin care Treatment Activities: Skin care regimen initiated : 12/07/2020 Topical wound management initiated : 12/07/2020 Notes: Electronic Signature(s) Signed: 02/12/2021 5:26:08 PM By: Rhae Hammock RN Signed: 02/12/2021 5:26:08 PM By: Rhae Hammock RN Entered By: Rhae Hammock on 02/12/2021 09:35:46 -------------------------------------------------------------------------------- Pain Assessment Details Patient Name: Date of Service: [name] 02/12/2021 8:30 A M Medical Record Number: [id number] Patient Account Number: [id number] Date of Birth/Sex: Treating RN: 15-Jul-1951 (70 y.o. [name] Primary Care Cathleen Yagi: Leanna Battles Other Clinician: Referring Tyland Klemens: Treating Avante Carneiro/Extender: Darrold Junker in Treatment: 9 Active Problems Location of Pain Severity and Description of Pain Patient Has Paino No Site Locations Pain Management and Medication Current Pain Management: Electronic  Signature(s) Signed: 02/12/2021 9:20:10 AM By: Sandre Kitty Signed: 02/12/2021 4:54:56 PM By: Deon Pilling Entered By: Sandre Kitty on 02/12/2021 08:44:18 -------------------------------------------------------------------------------- Patient/Caregiver Education Details Patient Name: Date of Service: [place], [name] 6/21/2022andnbsp8:30 A M Medical Record Number: [id number] Patient Account Number: [id number] Date of Birth/Gender: Treating RN: 12/14/1950 (70 y.o. [name] Primary Care Physician: Leanna Battles Other Clinician: Referring Physician: Treating Physician/Extender: Darrold Junker in Treatment: 9 Education Assessment Education Provided To: Patient Education Topics Provided Basic Hygiene: Methods: Explain/Verbal Responses: State content correctly [employer]) Signed: 02/12/2021 5:26:08 PM By: Rhae Hammock RN Entered By: Rhae Hammock on 02/12/2021 09:36:01 -------------------------------------------------------------------------------- Wound Assessment Details Patient Name: Date of Service: [name], [name] 02/12/2021 8:30 A M Medical Record Number: [id number] Patient Account Number: [id number] Date of Birth/Sex: Treating RN: 07-12-1951 (70 y.o. [name] Primary Care Quron Ruddy: Leanna Battles Other Clinician: Referring Analaya Hoey: Treating Shion Bluestein/Extender: Darrold Junker in Treatment: 9 Wound Status Wound Number: 1 Primary Diabetic Wound/Ulcer of the Lower Extremity Etiology: Wound Location: Left, Plantar T Great oe Wound Open Wounding Event: Trauma Status: Date Acquired: 08/10/2020 Comorbid Deep Vein Thrombosis, Hypertension, Type II Diabetes, Weeks Of Treatment: 9 History: Rheumatoid Arthritis, Neuropathy Clustered Wound: No Photos Wound Measurements Length: (  cm) 0.3 Width: (cm) 0.4 Depth: (cm) 0.1 Area: (cm) 0.094 Volume: (cm)  0.009 % Reduction in Area: 90% % Reduction in Volume: 90.4% Epithelialization: Large (67-100%) Tunneling: No Undermining: No Wound Description Classification: Grade 2 Wound Margin: Distinct, outline attached Exudate Amount: Medium Exudate Type: Serosanguineous Exudate Color: red, brown Foul Odor After Cleansing: No Slough/Fibrino No Wound Bed Granulation Amount: Large (67-100%) Exposed Structure Granulation Quality: Red Fascia Exposed: No Necrotic Amount: None Present (0%) Fat Layer (Subcutaneous Tissue) Exposed: Yes Tendon Exposed: No Muscle Exposed: No Joint Exposed: No Bone Exposed: No Electronic Signature(s) Signed: 02/12/2021 10:33:03 AM By: Sandre Kitty Signed: 02/12/2021 4:54:56 PM By: Deon Pilling Entered By: Sandre Kitty on 02/12/2021 10:09:13 -------------------------------------------------------------------------------- Vitals Details Patient Name: Date of Service: [name], [name] 02/12/2021 8:30 A M Medical Record Number: [id number] Patient Account Number: [id number] Date of Birth/Sex: Treating RN: Jan 23, 1951 (70 y.o. [name] Primary Care Malissa Slay: Leanna Battles Other Clinician: Referring Melvie Paglia: Treating Zayd Bonet/Extender: Darrold Junker in Treatment: 9 Vital Signs Time Taken: 08:43 Temperature (F): 98.3 Height (in): 71 Pulse (bpm): 66 Weight (lbs): 255 Respiratory Rate (breaths/min): 18 Body Mass Index (BMI): 35.6 Blood Pressure (mmHg): 132/82 Reference Range: 80 - 120 mg / dl Electronic Signature(s) Signed: 02/12/2021 9:20:10 AM By: Sandre Kitty Entered By: Sandre Kitty on 02/12/2021 08:44:12

## 2021-02-13 ENCOUNTER — Ambulatory Visit (INDEPENDENT_AMBULATORY_CARE_PROVIDER_SITE_OTHER): Payer: Medicare Other | Admitting: Podiatry

## 2021-02-13 DIAGNOSIS — L97509 Non-pressure chronic ulcer of other part of unspecified foot with unspecified severity: Secondary | ICD-10-CM

## 2021-02-13 DIAGNOSIS — E1142 Type 2 diabetes mellitus with diabetic polyneuropathy: Secondary | ICD-10-CM

## 2021-02-19 ENCOUNTER — Encounter: Payer: Self-pay | Admitting: Podiatry

## 2021-02-19 NOTE — Progress Notes (Signed)
Subjective:  Patient ID: Anthony Case, male    DOB: 23-Nov-1950,  MRN: [id number]  Chief Complaint  Patient presents with   Wound Check    Wound check     70 y.o. male presents for wound care.  Patient presents for follow-up of left hallux distal tip ulceration.  He states that he has been going to the wound care center.  He has been primarily being managed over there.  For now the wound seems to be decreasing well.  He denies any other acute complaints.   Review of Systems: Negative except as noted in the HPI. Denies N/V/F/Ch.  Past Medical History:  Diagnosis Date   Arthritis    Diabetes mellitus    Hx pulmonary embolism 2009   required emergent tPA treatment   Hypertension    Shortness of breath    Sleep apnea     Current Outpatient Medications:    ACETAMINOPHEN PO, Take by mouth. 2 tabs qd, Disp: , Rfl:    atorvastatin (LIPITOR) 20 MG tablet, Take 20 mg by mouth daily., Disp: , Rfl:    dapagliflozin propanediol (FARXIGA) 10 MG TABS tablet, Take 10 mg by mouth daily., Disp: , Rfl:    furosemide (LASIX) 20 MG tablet, Take 20 mg by mouth as needed., Disp: , Rfl:    glipiZIDE (GLUCOTROL XL) 2.5 MG 24 hr tablet, 2 (two) times daily. , Disp: , Rfl:    HYDROcodone-acetaminophen (NORCO/VICODIN) 5-325 MG tablet, SMARTSIG:1 Tablet(s) By Mouth Every 12 Hours PRN, Disp: , Rfl:    LEVEMIR FLEXTOUCH 100 UNIT/ML FlexPen, SMARTSIG:60 Unit(s) SUB-Q Daily, Disp: , Rfl:    losartan (COZAAR) 50 MG tablet, Take 50 mg by mouth daily., Disp: , Rfl:    metFORMIN (GLUCOPHAGE) 500 MG tablet, Take 1,000 mg by mouth daily with breakfast., Disp: , Rfl:    metoprolol succinate (TOPROL-XL) 25 MG 24 hr tablet, Take 25 mg by mouth daily., Disp: , Rfl:    NON FORMULARY, Trimix injection, Disp: , Rfl:    Rivaroxaban (XARELTO) 20 MG TABS tablet, Take 20 mg by mouth daily. 08/04/13 last dose of xarelto in preparation for colonoscopy, Disp: , Rfl:    Vitamin D, Ergocalciferol, (DRISDOL) 1.25 MG (50000 UT)  CAPS capsule, Take 50,000 Units by mouth once a week., Disp: , Rfl:   Social History   Tobacco Use  Smoking Status Never  Smokeless Tobacco Never    Allergies  Allergen Reactions   Lisinopril Cough   Objective:  There were no vitals filed for this visit. There is no height or weight on file to calculate BMI. Constitutional Well developed. Well nourished.  Vascular Dorsalis pedis pulses none palpable bilaterally. Posterior tibial pulses none palpable bilaterally. Capillary refill diminished to all digits.  No cyanosis or clubbing noted. Pedal hair growth normal.  Neurologic Normal speech. Oriented to person, place, and time. Protective sensation absent  Dermatologic Wound Location: Left hallux ulceration Wound Base: Mixed Granular/Fibrotic Peri-wound: Callused Exudate: Scant/small amount Serous exudate Wound Measurements: -See below  Orthopedic: No pain to palpation either foot.   Radiographs: None Assessment:   No diagnosis found.  Plan:  Patient was evaluated and treated and all questions answered.  Ulcer left hallux ulceration with fat layer exposed -Primarily being treated by the wound care center I will defer further management to them.  I discussed with the patient that if the wound does not get closed and appropriate time we can discuss doing graft placement as well.  Patient states understanding. -Given that  his wound is decreasing.  I will continue to follow from [place].  No follow-ups on file.

## 2021-02-20 DIAGNOSIS — L4050 Arthropathic psoriasis, unspecified: Secondary | ICD-10-CM | POA: Diagnosis not present

## 2021-02-26 ENCOUNTER — Other Ambulatory Visit: Payer: Self-pay

## 2021-02-26 ENCOUNTER — Encounter (HOSPITAL_BASED_OUTPATIENT_CLINIC_OR_DEPARTMENT_OTHER): Payer: Medicare Other | Attending: Internal Medicine | Admitting: Internal Medicine

## 2021-02-26 DIAGNOSIS — Z86718 Personal history of other venous thrombosis and embolism: Secondary | ICD-10-CM | POA: Insufficient documentation

## 2021-02-26 DIAGNOSIS — Z7901 Long term (current) use of anticoagulants: Secondary | ICD-10-CM | POA: Insufficient documentation

## 2021-02-26 DIAGNOSIS — Z794 Long term (current) use of insulin: Secondary | ICD-10-CM | POA: Diagnosis not present

## 2021-02-26 DIAGNOSIS — Z8249 Family history of ischemic heart disease and other diseases of the circulatory system: Secondary | ICD-10-CM | POA: Diagnosis not present

## 2021-02-26 DIAGNOSIS — E11621 Type 2 diabetes mellitus with foot ulcer: Secondary | ICD-10-CM | POA: Insufficient documentation

## 2021-02-26 DIAGNOSIS — M069 Rheumatoid arthritis, unspecified: Secondary | ICD-10-CM | POA: Diagnosis not present

## 2021-02-26 DIAGNOSIS — E1142 Type 2 diabetes mellitus with diabetic polyneuropathy: Secondary | ICD-10-CM | POA: Insufficient documentation

## 2021-03-01 NOTE — Progress Notes (Signed)
NEHEMIAS, SAUCEDA ([id number]) Visit Report for 02/26/2021 Arrival Information Details Patient Name: Date of Service: Anthony Case, Anthony [place] LD B. 02/26/2021 9:30 A M Medical Record Number: [id number] Patient Account Number: [id number] Date of Birth/Sex: Treating RN: 10-24-1950 (70 y.o. Anthony Case, Anthony Case Primary Care Brynleigh Sequeira: Anthony Case Other Clinician: Referring Cayci Mcnabb: Treating Sacha Topor/Extender: Darrold Junker in Treatment: 11 Visit Information History Since Last Visit Added or deleted any medications: No Patient Arrived: Ambulatory Any new allergies or adverse reactions: No Arrival Time: 09:30 Had a fall or experienced change in No Accompanied By: self activities of daily living that may affect Transfer Assistance: None risk of falls: Patient Identification Verified: Yes Signs or symptoms of abuse/neglect since last visito No Secondary Verification Process Completed: Yes Hospitalized since last visit: No Patient Requires Transmission-Based Precautions: No Implantable device outside of the clinic excluding No Patient Has Alerts: No cellular tissue based products placed in the center since last visit: Has Dressing in Place as Prescribed: Yes Pain Present Now: No Electronic Signature(s) Signed: 02/26/2021 7:10:16 PM By: Baruch Gouty RN, BSN Entered By: Baruch Gouty on 02/26/2021 09:35:45 -------------------------------------------------------------------------------- Lower Extremity Assessment Details Patient Name: Date of Service: [name], [name] 02/26/2021 9:30 A M Medical Record Number: [id number] Patient Account Number: [id number] Date of Birth/Sex: Treating RN: [birth year] (70 y.o. Ernestene Mention Primary Care Julus Kelley: Anthony Case Other Clinician: Referring Wynema Garoutte: Treating Jalissa Heinzelman/Extender: Darrold Junker in Treatment: 11 Edema Assessment Assessed: Shirlyn Goltz: No] [Right: No] Edema: [Left: N]  [Right: o] Calf Left: Right: Point of Measurement: 34 cm From Medial Instep 41.5 cm Ankle Left: Right: Point of Measurement: 12 cm From Medial Instep 24.6 cm Vascular Assessment Pulses: Dorsalis Pedis Palpable: [Left:Yes] Electronic Signature(s) Signed: 02/26/2021 7:10:16 PM By: Baruch Gouty RN, BSN Entered By: Baruch Gouty on 02/26/2021 09:41:10 -------------------------------------------------------------------------------- Multi Wound Chart Details Patient Name: Date of Service: [name], [name] 02/26/2021 9:30 A M Medical Record Number: [id number] Patient Account Number: [id number] Date of Birth/Sex: Treating RN: 06-20-1951 (70 y.o. [name], Lauren Primary Care Shi Grose: Anthony Case Other Clinician: Referring Jalyn Dutta: Treating Brayden Betters/Extender: Darrold Junker in Treatment: 11 Vital Signs Height(in): 76 Pulse(bpm): 64 Weight(lbs): 52 Blood Pressure(mmHg): 109/64 Body Mass Index(BMI): 36 Temperature(F): 98.7 Respiratory Rate(breaths/min): 18 Photos: [1:No Photos Left, Plantar T Great oe] [N/A:N/A N/A] Wound Location: [1:Trauma] [N/A:N/A] Wounding Event: [1:Diabetic Wound/Ulcer of the Lower] [N/A:N/A] Primary Etiology: [1:Extremity Deep Vein Thrombosis, Hypertension, N/A] Comorbid History: [1:Type II Diabetes, Rheumatoid Arthritis, Neuropathy 08/10/2020] [N/A:N/A] Date Acquired: [1:11] [N/A:N/A] Weeks of Treatment: [1:Open] [N/A:N/A] Wound Status: [1:0.3x0.4x0.2] [N/A:N/A] Measurements L x W x D (cm) [1:0.094] [N/A:N/A] A (cm) : rea [1:0.019] [N/A:N/A] Volume (cm) : [1:90.00%] [N/A:N/A] % Reduction in A [1:rea: 79.80%] [N/A:N/A] % Reduction in Volume: [1:12] Starting Position 1 (o'clock): [1:12] Ending Position 1 (o'clock): [1:0.2] Maximum Distance 1 (cm): [1:Yes] [N/A:N/A] Undermining: [1:Grade 2] [N/A:N/A] Classification: [1:Medium] [N/A:N/A] Exudate A mount: [1:Serosanguineous] [N/A:N/A] Exudate Type:  [1:red, brown] [N/A:N/A] Exudate Color: [1:Well defined, not attached] [N/A:N/A] Wound Margin: [1:Large (67-100%)] [N/A:N/A] Granulation A mount: [1:Red, Pink] [N/A:N/A] Granulation Quality: [1:None Present (0%)] [N/A:N/A] Necrotic A mount: [1:Fat Layer (Subcutaneous Tissue): Yes N/A] Exposed Structures: [1:Fascia: No Tendon: No Muscle: No Joint: No Bone: No None] [N/A:N/A] Epithelialization: [1:Debridement - Excisional] [N/A:N/A] Debridement: Pre-procedure Verification/Time Out 10:10 [N/A:N/A] Taken: [1:Lidocaine 4% Topical Solution] [N/A:N/A] Pain Control: [1:Callus, Subcutaneous, Slough] [N/A:N/A] Tissue Debrided: [1:Skin/Subcutaneous Tissue] [N/A:N/A] Level: [1:0.25] [N/A:N/A] Debridement A (sq cm): [1:rea Curette] [N/A:N/A] Instrument: [1:Minimum] [N/A:N/A]  Bleeding: [1:Pressure] [N/A:N/A] Hemostasis A chieved: [1:0] [N/A:N/A] Procedural Pain: [1:0] [N/A:N/A] Post Procedural Pain: [1:Procedure was tolerated well] [N/A:N/A] Debridement Treatment Response: [1:0.3x0.4x0.2] [N/A:N/A] Post Debridement Measurements L x W x D (cm) [1:0.019] [N/A:N/A] Post Debridement Volume: (cm) [1:Debridement] [N/A:N/A] Treatment Notes Electronic Signature(s) Signed: 02/26/2021 11:16:16 AM By: Kalman Shan DO Signed: 03/01/2021 5:38:08 PM By: Rhae Hammock RN Entered By: Kalman Shan on 02/26/2021 11:06:30 -------------------------------------------------------------------------------- Multi-Disciplinary Care Plan Details Patient Name: Date of Service: [name], [name] 02/26/2021 9:30 A M Medical Record Number: [id number] Patient Account Number: [id number] Date of Birth/Sex: Treating RN: 02/08/1951 (70 y.o. [name] Primary Care Esiquio Boesen: Anthony Case Other Clinician: Referring Johna Kearl: Treating Kamren Heintzelman/Extender: Darrold Junker in Treatment: 11 Active Inactive Nutrition Nursing Diagnoses: Impaired glucose control: actual or  potential Goals: Patient/caregiver verbalizes understanding of need to maintain therapeutic glucose control per primary care physician Date Initiated: 12/07/2020 Target Resolution Date: 03/17/2021 Goal Status: Active Interventions: Assess HgA1c results as ordered upon admission and as needed Assess patient nutrition upon admission and as needed per policy Provide education on elevated blood sugars and impact on wound healing Treatment Activities: Obtain HgA1c : 12/07/2020 Notes: Wound/Skin Impairment Nursing Diagnoses: Impaired tissue integrity Goals: Patient/caregiver will verbalize understanding of skin care regimen Date Initiated: 12/07/2020 Target Resolution Date: 03/19/2021 Goal Status: Active Ulcer/skin breakdown will have a volume reduction of 30% by week 4 Date Initiated: 12/07/2020 Target Resolution Date: 03/19/2021 Goal Status: Active Interventions: Assess patient/caregiver ability to obtain necessary supplies Assess patient/caregiver ability to perform ulcer/skin care regimen upon admission and as needed Assess ulceration(s) every visit Provide education on ulcer and skin care Treatment Activities: Skin care regimen initiated : 12/07/2020 Topical wound management initiated : 12/07/2020 Notes: Electronic Signature(s) Signed: 02/26/2021 6:55:28 PM By: Deon Pilling Entered By: Deon Pilling on 02/26/2021 09:54:03 -------------------------------------------------------------------------------- Pain Assessment Details Patient Name: Date of Service: [name], [name] 02/26/2021 9:30 A M Medical Record Number: [id number] Patient Account Number: [id number] Date of Birth/Sex: Treating RN: 1951-06-11 (70 y.o. Ernestene Mention Primary Care Cereniti Curb: Anthony Case Other Clinician: Referring Vylette Strubel: Treating Crystalann Korf/Extender: Darrold Junker in Treatment: 11 Active Problems Location of Pain Severity and Description of Pain Patient Has Paino  No Site Locations Rate the pain. Current Pain Level: 0 Pain Management and Medication Current Pain Management: Electronic Signature(s) Signed: 02/26/2021 7:10:16 PM By: Baruch Gouty RN, BSN Entered By: Baruch Gouty on 02/26/2021 09:37:32 -------------------------------------------------------------------------------- Patient/Caregiver Education Details Patient Name: Date of Service: Montez Morita, [name] 7/5/2022andnbsp9:30 A M Medical Record Number: [id number] Patient Account Number: [id number] Date of Birth/Gender: Treating RN: 1951-06-26 (70 y.o. [name] Primary Care Physician: Anthony Case Other Clinician: Referring Physician: Treating Physician/Extender: Darrold Junker in Treatment: 11 Education Assessment Education Provided To: Patient Education Topics Provided Elevated Blood Sugar/ Impact on Healing: Handouts: Elevated Blood Sugars: How Do They Affect Wound Healing Methods: Explain/Verbal Responses: Reinforcements needed Electronic Signature(s) Signed: 02/26/2021 6:55:28 PM By: Deon Pilling Entered By: Deon Pilling on 02/26/2021 09:54:15 -------------------------------------------------------------------------------- Wound Assessment Details Patient Name: Date of Service: [name], [name] 02/26/2021 9:30 A M Medical Record Number: [id number] Patient Account Number: [id number] Date of Birth/Sex: Treating RN: 1951-08-09 (70 y.o. Ernestene Mention Primary Care Briar Witherspoon: Anthony Case Other Clinician: Referring Jakera Beaupre: Treating Caelan Branden/Extender: Darrold Junker in Treatment: 11 Wound Status Wound Number: 1 Primary Diabetic Wound/Ulcer of the Lower Extremity Etiology: Wound Location: Left, Plantar T Great oe Wound  Open Wounding Event: Trauma Status: Date Acquired: 08/10/2020 Comorbid Deep Vein Thrombosis, Hypertension, Type II Diabetes, Weeks Of Treatment: 11 History: Rheumatoid  Arthritis, Neuropathy Clustered Wound: No Photos Wound Measurements Length: (cm) 0.3 Width: (cm) 0.4 Depth: (cm) 0.2 Area: (cm) 0.094 Volume: (cm) 0.019 % Reduction in Area: 90% % Reduction in Volume: 79.8% Epithelialization: None Tunneling: No Undermining: Yes Starting Position (o'clock): 12 Ending Position (o'clock): 12 Maximum Distance: (cm) 0.2 Wound Description Classification: Grade 2 Wound Margin: Well defined, not attached Exudate Amount: Medium Exudate Type: Serosanguineous Exudate Color: red, brown Foul Odor After Cleansing: No Slough/Fibrino No Wound Bed Granulation Amount: Large (67-100%) Exposed Structure Granulation Quality: Red, Pink Fascia Exposed: No Necrotic Amount: None Present (0%) Fat Layer (Subcutaneous Tissue) Exposed: Yes Tendon Exposed: No Muscle Exposed: No Joint Exposed: No Bone Exposed: No Electronic Signature(s) Signed: 02/26/2021 5:48:24 PM By: Sandre Kitty Signed: 02/26/2021 7:10:16 PM By: Baruch Gouty RN, BSN Entered By: Sandre Kitty on 02/26/2021 17:19:31 -------------------------------------------------------------------------------- [place] Details Patient Name: Date of Service: [name], [name] 02/26/2021 9:30 A M Medical Record Number: [id number] Patient Account Number: [id number] Date of Birth/Sex: Treating RN: 1951/02/24 (70 y.o. Ernestene Mention Primary Care Alianah Lofton: Anthony Case Other Clinician: Referring Jyles Sontag: Treating Dajour Pierpoint/Extender: Darrold Junker in Treatment: 11 Vital Signs Time Taken: 09:35 Temperature (F): 98.7 Height (in): 71 Pulse (bpm): 71 Source: Stated Respiratory Rate (breaths/min): 18 Weight (lbs): 255 Blood Pressure (mmHg): 109/64 Source: Stated Reference Range: 80 - 120 mg / dl Body Mass Index (BMI): 35.6 Electronic Signature(s) Signed: 02/26/2021 7:10:16 PM By: Baruch Gouty RN, BSN Entered By: Baruch Gouty on 02/26/2021 09:37:22

## 2021-03-01 NOTE — Progress Notes (Signed)
TRENTON, PASSOW ([id number]) Visit Report for 02/26/2021 Chief Complaint Document Details Patient Name: Date of Service: [place], [name] 02/26/2021 9:30 A M Medical Record Number: [id number] Patient Account Number: [id number] Date of Birth/Sex: Treating RN: [birth year] (70 y.o. [name], Lauren Primary Care Provider: Leanna Battles Other Clinician: Referring Provider: Treating Provider/Extender: Darrold Junker in Treatment: 11 Information Obtained from: Patient Chief Complaint Left great toe wound Electronic Signature(s) Signed: 02/26/2021 11:16:16 AM By: Kalman Shan DO Entered By: Kalman Shan on 02/26/2021 11:06:38 -------------------------------------------------------------------------------- Debridement Details Patient Name: Date of Service: [place] Anthony Case, [name] 02/26/2021 9:30 A M Medical Record Number: [id number] Patient Account Number: [id number] Date of Birth/Sex: Treating RN: Jun 01, 1951 (70 y.o. Lorette Ang, Meta.Reding Primary Care Provider: Leanna Battles Other Clinician: Referring Provider: Treating Provider/Extender: Darrold Junker in Treatment: 11 Debridement Performed for Assessment: Wound #1 Left,Plantar T Great oe Performed By: Physician Kalman Shan, DO Debridement Type: Debridement Severity of Tissue Pre Debridement: Fat layer exposed Level of Consciousness (Pre-procedure): Awake and Alert Pre-procedure Verification/Time Out Yes - 10:10 Taken: Start Time: 10:11 Pain Control: Lidocaine 4% T opical Solution T Area Debrided (L x W): otal 0.5 (cm) x 0.5 (cm) = 0.25 (cm) Tissue and other material debrided: Viable, Non-Viable, Callus, Slough, Subcutaneous, Skin: Dermis , Skin: Epidermis, Fibrin/Exudate, Slough Level: Skin/Subcutaneous Tissue Debridement Description: Excisional Instrument: Curette Bleeding: Minimum Hemostasis Achieved: Pressure End Time: 10:15 Procedural Pain: 0 Post  Procedural Pain: 0 Response to Treatment: Procedure was tolerated well Level of Consciousness (Post- Awake and Alert procedure): Post Debridement Measurements of Total Wound Length: (cm) 0.3 Width: (cm) 0.4 Depth: (cm) 0.2 Volume: (cm) 0.019 Character of Wound/Ulcer Post Debridement: Improved Severity of Tissue Post Debridement: Fat layer exposed Post Procedure Diagnosis Same as Pre-procedure Electronic Signature(s) Signed: 02/26/2021 11:16:16 AM By: Kalman Shan DO Signed: 02/26/2021 6:55:28 PM By: Deon Pilling Entered By: Deon Pilling on 02/26/2021 10:16:18 -------------------------------------------------------------------------------- HPI Details Patient Name: Date of Service: [place] Anthony Case, [name] 02/26/2021 9:30 A M Medical Record Number: [id number] Patient Account Number: [id number] Date of Birth/Sex: Treating RN: 1951/03/29 (70 y.o. 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 He presents to the clinic today for a left great toe wound. He states that he stepped on something sharp that sliced the top part of his left great toe back in December 2021. He has been evaluated by his primary care physician and reports taking 2 rounds of antibiotics. He recently saw podiatry and the toe was debrided and padded. He is not currently doing any dressing changes to the wound. He denies increased warmth or erythema to the foot. He denies fever/chills, purulent drainage or nausea/vomiting. 12/13/2020: Patient presents for 1 week follow-up. He was evaluated last week for his left great toe ulcer and started  on Hydrofera Blue. I sent him for ABIs with TBI's and he had this done. He also started using a surgical shoe to help with pressure relief on the great toe. He has tolerated this well. He states he saw his podiatrist yesterday for follow-up. They Have referred him to see Dr. Earleen Newport. They are also planning to do a skin graft to the wound that if it  has not resolved in the next month. Overall patient states he feels well and has no complaints today. 5/5; patient presents for 1 week follow-up. He reports no issues today. He has been using Hydrofera Blue. He states he saw Dr. Earleen Newport and had a Imaging of his lower extremities. He states he feels well overall. He is going on a trip to [place] in 1-2 weeks. 5/17; patient presents for follow-up. Its been almost 2 weeks since he was last seen. He has been using Hydrofera Blue every other day. He overall feels well and denies signs of infection. He is leaving for a month-long trip to [place] tomorrow. 6/21; patient presents for 1 month follow-up. He recently went on a 1 month trip to [place]. He had a relaxing and enjoyable time. He has been using Hydrofera Blue daily on the wound. He recently ran out of supplies and has been keeping it covered with a Band-Aid. He denies signs of infection and has no issues or complaints today. 7/5; patient presents for 2-week follow-up. He has been using Hydrofera Blue to the wound bed. He is wearing closed toed shoes and states he is not using his sandals anymore. He reports walking barefoot but keeps the wound area covered. He denies signs of infection. Electronic Signature(s) Signed: 02/26/2021 11:16:16 AM By: Kalman Shan DO Entered By: Kalman Shan on 02/26/2021 11:10:59 -------------------------------------------------------------------------------- Physical Exam Details Patient Name: Date of Service: [place] Anthony Case, [name] 02/26/2021 9:30 A M Medical Record Number: [id number] Patient Account Number:  [id number] Date of Birth/Sex: Treating RN: 16-Mar-1951 (70 y.o. [name] Primary Care Provider: Leanna Battles Other Clinician: Referring Provider: Treating Provider/Extender: Darrold Junker in Treatment: 11 Constitutional respirations regular, non-labored and within target range for patient.Marland Kitchen Psychiatric pleasant and cooperative. Notes Left foot: Plantar great toe with an open wound with some nonviable tissue present. Slight undermining circumferentially. No signs of infection. Electronic Signature(s) Signed: 02/26/2021 11:16:16 AM By: Kalman Shan DO Entered By: Kalman Shan on 02/26/2021 11:13:21 -------------------------------------------------------------------------------- Physician Orders Details Patient Name: Date of Service: [place] Anthony Case, [name] 02/26/2021 9:30 A M Medical Record Number: [id number] Patient Account Number: [id number] Date of Birth/Sex: Treating RN: 09-17-1950 (70 y.o. [name] Primary Care Provider: Leanna Battles Other Clinician: Referring Provider: Treating Provider/Extender: Darrold Junker in Treatment: 11 Verbal / Phone Orders: No Diagnosis Coding ICD-10 Coding Code Description E11.621 Type 2 diabetes mellitus with foot ulcer E11.40 Type 2 diabetes mellitus with diabetic neuropathy, unspecified Z86.718 Personal history of other venous thrombosis and embolism I10 Essential (primary) hypertension Follow-up Appointments ppointment in 2 weeks. - Dr. Heber Jacobus Tuesday Return A Bathing/ Shower/ Hygiene May shower with protection but do not get wound dressing(s) wet. - use cast protector when not changing the dressing. May shower and wash wound with soap and water. - with dressing changes only. Edema Control - Lymphedema / SCD / Other Elevate legs to the level of the heart or above for 30 minutes daily and/or when sitting, a frequency of: - throughout the day. Avoid standing  for long periods of time. Moisturize legs daily. - both legs every night before bed. Wound Treatment Wound #1 - T Great oe Wound Laterality: Plantar, Left Cleanser: Soap and Water Every Other Day/15 Days Discharge Instructions: May shower and wash wound with dial antibacterial soap and water prior to dressing change. Cleanser: Wound Cleanser (Generic) Every Other Day/15 Days Discharge Instructions: Cleanse the wound with wound cleanser prior to applying a clean  dressing using gauze sponges, not tissue or cotton balls. Prim Dressing: Hydrofera Blue Classic Foam, 2x2 in (Generic) Every Other Day/15 Days ary Discharge Instructions: Moisten with saline prior to applying to wound bed Prim Dressing: Santyl Ointment Every Other Day/15 Days ary Discharge Instructions: ****APPLY ONLY IN CLINIC.***Apply nickel thick amount to wound bed as instructed Secondary Dressing: Woven Gauze Sponge, Non-Sterile 4x4 in (Generic) Every Other Day/15 Days Discharge Instructions: Apply over primary dressing as directed. Secondary Dressing: Optifoam Non-Adhesive Dressing, 4x4 in (Generic) Every Other Day/15 Days Discharge Instructions: **Foam donut as secondary.** Apply over primary dressing as directed. Secured With: Child psychotherapist, Sterile 2x75 (in/in) (Generic) Every Other Day/15 Days Discharge Instructions: Secure with stretch gauze as directed. Secured With: 56M Medipore H Soft Cloth Surgical Tape, 2x2 (in/yd) (Generic) Every Other Day/15 Days Discharge Instructions: Secure dressing with tape as directed. Electronic Signature(s) Signed: 02/26/2021 11:16:16 AM By: Kalman Shan DO Entered By: Kalman Shan on 02/26/2021 11:13:55 -------------------------------------------------------------------------------- Problem List Details Patient Name: Date of Service: [place] Anthony Case, [name] 02/26/2021 9:30 A M Medical Record Number: [id number] Patient Account Number: [id number] Date of  Birth/Sex: Treating RN: Sep 11, 1950 (70 y.o. [name] Primary Care Provider: Leanna Battles Other Clinician: Referring Provider: Treating Provider/Extender: Darrold Junker in Treatment: 11 Active Problems ICD-10 Encounter Code Description Active Date MDM Diagnosis E11.621 Type 2 diabetes mellitus with foot ulcer 12/07/2020 No Yes E11.40 Type 2 diabetes mellitus with diabetic neuropathy, unspecified 12/07/2020 No Yes Z86.718 Personal history of other venous thrombosis and embolism 12/07/2020 No Yes I10 Essential (primary) hypertension 12/07/2020 No Yes Inactive Problems Resolved Problems Electronic Signature(s) Signed: 02/26/2021 11:16:16 AM By: Kalman Shan DO Entered By: Kalman Shan on 02/26/2021 11:06:24 -------------------------------------------------------------------------------- Progress Note Details Patient Name: Date of Service: [place] Anthony Case, [name] 02/26/2021 9:30 A M Medical Record Number: [id number] Patient Account Number: [id number] Date of Birth/Sex: Treating RN: Dec 23, 1950 (70 y.o. 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 He presents to the clinic today for a left great toe wound. He states that he stepped on something sharp that sliced the top part of his left great toe back in December 2021. He has been evaluated by his primary care physician and reports taking 2 rounds of antibiotics. He recently saw podiatry and the toe was debrided and padded. He is not  currently doing any dressing changes to the wound. He denies increased warmth or erythema to the foot. He denies fever/chills, purulent drainage or nausea/vomiting. 12/13/2020: Patient presents for 1 week follow-up. He was evaluated last week for his left great toe ulcer and started on Hydrofera Blue. I sent him for ABIs with TBI's and he had this done. He also started using a surgical shoe to help with pressure relief on the great toe. He has tolerated this well. He states he saw his podiatrist yesterday for follow-up. They Have referred him to see Dr. Earleen Newport. They are also planning to do a skin graft to the wound that if it has not resolved in the next month. Overall patient states he feels well and has no complaints today. 5/5; patient presents for 1 week follow-up.  He reports no issues today. He has been using Hydrofera Blue. He states he saw Dr. Earleen Newport and had a Imaging of his lower extremities. He states he feels well overall. He is going on a trip to [place] in 1-2 weeks. 5/17; patient presents for follow-up. Its been almost 2 weeks since he was last seen. He has been using Hydrofera Blue every other day. He overall feels well and denies signs of infection. He is leaving for a month-long trip to [place] tomorrow. 6/21; patient presents for 1 month follow-up. He recently went on a 1 month trip to [place]. He had a relaxing and enjoyable time. He has been using Hydrofera Blue daily on the wound. He recently ran out of supplies and has been keeping it covered with a Band-Aid. He denies signs of infection and has no issues or complaints today. 7/5; patient presents for 2-week follow-up. He has been using Hydrofera Blue to the wound bed. He is wearing closed toed shoes and states he is not using his sandals anymore. He reports walking barefoot but keeps the wound area covered. He denies signs of infection. Patient History Information obtained from Patient. Family History Heart Disease, No family  history of Cancer, Diabetes, Hereditary Spherocytosis, Hypertension, Kidney Disease, Lung Disease, Seizures, Stroke, Thyroid Problems, Tuberculosis. Social History Never smoker, Marital Status - Married, Alcohol Use - Rarely - prior history, Drug Use - No History, Caffeine Use - Rarely. Medical History Eyes Denies history of Cataracts, Glaucoma, Optic Neuritis Ear/Nose/Mouth/Throat Denies history of Chronic sinus problems/congestion, Middle ear problems Hematologic/Lymphatic Denies history of Anemia, Hemophilia, Human Immunodeficiency Virus, Lymphedema, Sickle Cell Disease Respiratory Denies history of Aspiration, Asthma, Chronic Obstructive Pulmonary Disease (COPD), Pneumothorax, Sleep Apnea, Tuberculosis Cardiovascular Patient has history of Deep Vein Thrombosis - hx DVT Hypertension , Denies history of Angina, Arrhythmia, Congestive Heart Failure, Coronary Artery Disease, Hypotension, Myocardial Infarction, Peripheral Arterial Disease, Peripheral Venous Disease, Phlebitis, Vasculitis Gastrointestinal Denies history of Cirrhosis , Colitis, Crohnoos, Hepatitis A, Hepatitis B, Hepatitis C Endocrine Patient has history of Type II Diabetes Denies history of Type I Diabetes Genitourinary Denies history of End Stage Renal Disease Immunological Denies history of Lupus Erythematosus, Raynaudoos, Scleroderma Integumentary (Skin) Denies history of History of Burn Musculoskeletal Patient has history of Rheumatoid Arthritis Denies history of Gout, Osteoarthritis, Osteomyelitis Neurologic Patient has history of Neuropathy Denies history of Dementia, Quadriplegia, Paraplegia, Seizure Disorder Oncologic Denies history of Received Chemotherapy, Received Radiation Hospitalization/Surgery History - spine surgery. Medical A Surgical History Notes nd Eyes DM retinopathy Objective Constitutional respirations regular, non-labored and within target range for patient.. Vitals Time Taken:  9:35 AM, Height: 71 in, Source: Stated, Weight: 255 lbs, Source: Stated, BMI: 35.6, Temperature: 98.7 F, Pulse: 71 bpm, Respiratory Rate: 18 breaths/min, Blood Pressure: 109/64 mmHg. Psychiatric pleasant and cooperative. General Notes: Left foot: Plantar great toe with an open wound with some nonviable tissue present. Slight undermining circumferentially. No signs of infection. Integumentary (Hair, Skin) Wound #1 status is Open. Original cause of wound was Trauma. The date acquired was: 08/10/2020. The wound has been in treatment 11 weeks. The wound is located on the [employer]. The wound measures 0.3cm length x 0.4cm width x 0.2cm depth; 0.094cm^2 area and 0.019cm^3 volume. There is Fat oe Layer (Subcutaneous Tissue) exposed. There is no tunneling noted, however, there is undermining starting at 12:00 and ending at 12:00 with a maximum distance of 0.2cm. There is a medium amount of serosanguineous drainage noted. The wound margin is well defined and not attached to  the wound base. There is large (67-100%) red, pink granulation within the wound bed. There is no necrotic tissue within the wound bed. Assessment Active Problems ICD-10 Type 2 diabetes mellitus with foot ulcer Type 2 diabetes mellitus with diabetic neuropathy, unspecified Personal history of other venous thrombosis and embolism Essential (primary) hypertension Patient's wound has shown improvement in size and appearance. There is some undermining and nonviable tissue and this was removed today. I recommended continuing to use Hydrofera Blue and keeping the area protected. I will see him back in 2 weeks. Procedures Wound #1 Pre-procedure diagnosis of Wound #1 is a Diabetic Wound/Ulcer of the Lower Extremity located on the Left,Plantar T Great .Severity of Tissue Pre oe Debridement is: Fat layer exposed. There was a Excisional Skin/Subcutaneous Tissue Debridement with a total area of 0.25 sq cm performed by  Kalman Shan, DO. With the following instrument(s): Curette to remove Viable and Non-Viable tissue/material. Material removed includes Callus, Subcutaneous Tissue, Slough, Skin: Dermis, Skin: Epidermis, and Fibrin/Exudate after achieving pain control using Lidocaine 4% Topical Solution. A time out was conducted at 10:10, prior to the start of the procedure. A Minimum amount of bleeding was controlled with Pressure. The procedure was tolerated well with a pain level of 0 throughout and a pain level of 0 following the procedure. Post Debridement Measurements: 0.3cm length x 0.4cm width x 0.2cm depth; 0.019cm^3 volume. Character of Wound/Ulcer Post Debridement is improved. Severity of Tissue Post Debridement is: Fat layer exposed. Post procedure Diagnosis Wound #1: Same as Pre-Procedure Plan Follow-up Appointments: Return Appointment in 2 weeks. - Dr. Heber [place] Tuesday Bathing/ Shower/ Hygiene: May shower with protection but do not get wound dressing(s) wet. - use cast protector when not changing the dressing. May shower and wash wound with soap and water. - with dressing changes only. Edema Control - Lymphedema / SCD / Other: Elevate legs to the level of the heart or above for 30 minutes daily and/or when sitting, a frequency of: - throughout the day. Avoid standing for long periods of time. Moisturize legs daily. - both legs every night before bed. WOUND #1: - T Great Wound Laterality: Plantar, Left oe Cleanser: Soap and Water Every Other Day/15 Days Discharge Instructions: May shower and wash wound with dial antibacterial soap and water prior to dressing change. Cleanser: Wound Cleanser (Generic) Every Other Day/15 Days Discharge Instructions: Cleanse the wound with wound cleanser prior to applying a clean dressing using gauze sponges, not tissue or cotton balls. Prim Dressing: Hydrofera Blue Classic Foam, 2x2 in (Generic) Every Other Day/15 Days ary Discharge Instructions: Moisten with  saline prior to applying to wound bed Prim Dressing: Santyl Ointment Every Other Day/15 Days ary Discharge Instructions: ****APPLY ONLY IN CLINIC.***Apply nickel thick amount to wound bed as instructed Secondary Dressing: Woven Gauze Sponge, Non-Sterile 4x4 in (Generic) Every Other Day/15 Days Discharge Instructions: Apply over primary dressing as directed. Secondary Dressing: Optifoam Non-Adhesive Dressing, 4x4 in (Generic) Every Other Day/15 Days Discharge Instructions: **Foam donut as secondary.** Apply over primary dressing as directed. Secured With: Child psychotherapist, Sterile 2x75 (in/in) (Generic) Every Other Day/15 Days Discharge Instructions: Secure with stretch gauze as directed. Secured With: 21M Medipore H Soft Cloth Surgical T ape, 2x2 (in/yd) (Generic) Every Other Day/15 Days Discharge Instructions: Secure dressing with tape as directed. 1. In office sharp debridement 2. Hydrofera Blue 3. Follow-up in 2 weeks Electronic Signature(s) Signed: 02/26/2021 11:16:16 AM By: Kalman Shan DO Entered By: Kalman Shan on 02/26/2021 11:15:22 -------------------------------------------------------------------------------- HxROS Details Patient Name:  Date of Service: [place], [name] 02/26/2021 9:30 A M Medical Record Number: [id number] Patient Account Number: [id number] Date of Birth/Sex: Treating RN: [birth year] (70 y.o. [name], Lauren Primary Care Provider: Leanna Battles Other Clinician: Referring Provider: Treating Provider/Extender: Darrold Junker in Treatment: 11 Information Obtained From Patient Eyes Medical History: Negative for: Cataracts; Glaucoma; Optic Neuritis Past Medical History Notes: DM retinopathy Ear/Nose/Mouth/Throat Medical History: Negative for: Chronic sinus problems/congestion; Middle ear problems Hematologic/Lymphatic Medical History: Negative for: Anemia; Hemophilia; Human Immunodeficiency  Virus; Lymphedema; Sickle Cell Disease Respiratory Medical History: Negative for: Aspiration; Asthma; Chronic Obstructive Pulmonary Disease (COPD); Pneumothorax; Sleep Apnea; Tuberculosis Cardiovascular Medical History: Positive for: Deep Vein Thrombosis - hx DVT Hypertension ; Negative for: Angina; Arrhythmia; Congestive Heart Failure; Coronary Artery Disease; Hypotension; Myocardial Infarction; Peripheral Arterial Disease; Peripheral Venous Disease; Phlebitis; Vasculitis Gastrointestinal Medical History: Negative for: Cirrhosis ; Colitis; Crohns; Hepatitis A; Hepatitis B; Hepatitis C Endocrine Medical History: Positive for: Type II Diabetes Negative for: Type I Diabetes Treated with: Insulin, Oral agents Blood sugar tested every day: No Genitourinary Medical History: Negative for: End Stage Renal Disease Immunological Medical History: Negative for: Lupus Erythematosus; Raynauds; Scleroderma Integumentary (Skin) Medical History: Negative for: History of Burn Musculoskeletal Medical History: Positive for: Rheumatoid Arthritis Negative for: Gout; Osteoarthritis; Osteomyelitis Neurologic Medical History: Positive for: Neuropathy Negative for: Dementia; Quadriplegia; Paraplegia; Seizure Disorder Oncologic Medical History: Negative for: Received Chemotherapy; Received Radiation Immunizations Pneumococcal Vaccine: Received Pneumococcal Vaccination: No Implantable Devices None Hospitalization / Surgery History Type of Hospitalization/Surgery spine surgery Family and Social History Cancer: No; Diabetes: No; Heart Disease: Yes; Hereditary Spherocytosis: No; Hypertension: No; Kidney Disease: No; Lung Disease: No; Seizures: No; Stroke: No; Thyroid Problems: No; Tuberculosis: No; Never smoker; Marital Status - Married; Alcohol Use: Rarely - prior history; Drug Use: No History; Caffeine Use: Rarely; Financial Concerns: No; Food, Clothing or Shelter Needs: No; Support System  Lacking: No; Transportation Concerns: No Electronic Signature(s) Signed: 02/26/2021 11:16:16 AM By: Kalman Shan DO Signed: 03/01/2021 5:38:08 PM By: Rhae Hammock RN Entered By: Kalman Shan on 02/26/2021 11:11:10 -------------------------------------------------------------------------------- SuperBill Details Patient Name: Date of Service: [place] Anthony Case, [name] 02/26/2021 Medical Record Number: [id number] Patient Account Number: [id number] Date of Birth/Sex: Treating RN: Oct 12, 1950 (70 y.o. [name] Primary Care Provider: Leanna Battles Other Clinician: Referring Provider: Treating Provider/Extender: Darrold Junker in Treatment: 11 Diagnosis Coding ICD-10 Codes Code Description E11.621 Type 2 diabetes mellitus with foot ulcer E11.40 Type 2 diabetes mellitus with diabetic neuropathy, unspecified Z86.718 Personal history of other venous thrombosis and embolism I10 Essential (primary) hypertension Facility Procedures CPT4 Code: [id number] Description: 02585 - DEB SUBQ TISSUE 20 SQ CM/< ICD-10 Diagnosis Description E11.621 Type 2 diabetes mellitus with foot ulcer Modifier: Quantity: 1 Physician Procedures : CPT4 Code Description Modifier [id number] - WC PHYS SUBQ TISS 20 SQ CM ICD-10 Diagnosis Description E11.621 Type 2 diabetes mellitus with foot ulcer Quantity: 1 Electronic Signature(s) Signed: 02/26/2021 11:16:16 AM By: Kalman Shan DO Entered By: Kalman Shan on 02/26/2021 11:15:47

## 2021-03-22 ENCOUNTER — Encounter (HOSPITAL_BASED_OUTPATIENT_CLINIC_OR_DEPARTMENT_OTHER): Payer: Medicare Other | Admitting: Internal Medicine

## 2021-03-22 ENCOUNTER — Other Ambulatory Visit: Payer: Self-pay

## 2021-03-22 DIAGNOSIS — E11621 Type 2 diabetes mellitus with foot ulcer: Secondary | ICD-10-CM | POA: Diagnosis not present

## 2021-03-22 DIAGNOSIS — Z86718 Personal history of other venous thrombosis and embolism: Secondary | ICD-10-CM | POA: Diagnosis not present

## 2021-03-22 DIAGNOSIS — Z7901 Long term (current) use of anticoagulants: Secondary | ICD-10-CM | POA: Diagnosis not present

## 2021-03-22 DIAGNOSIS — Z794 Long term (current) use of insulin: Secondary | ICD-10-CM | POA: Diagnosis not present

## 2021-03-22 DIAGNOSIS — M069 Rheumatoid arthritis, unspecified: Secondary | ICD-10-CM | POA: Diagnosis not present

## 2021-03-22 DIAGNOSIS — E1142 Type 2 diabetes mellitus with diabetic polyneuropathy: Secondary | ICD-10-CM | POA: Diagnosis not present

## 2021-03-22 NOTE — Progress Notes (Signed)
Anthony Case, Anthony Case ([id number]) Visit Report for 03/22/2021 Chief Complaint Document Details Patient Name: Date of Service: [place], [name] 03/22/2021 8:30 A M Medical Record Number: [id number] Patient Account Number: [id number] Date of Birth/Sex: Treating RN: 1951/07/07 (70 y.o. Ernestene Mention Primary Care Provider: Leanna Battles Other Clinician: Referring Provider: Treating Provider/Extender: Darrold Junker in Treatment: 15 Information Obtained from: Patient Chief Complaint Left great toe wound Electronic Signature(s) Signed: 03/22/2021 9:32:21 AM By: Kalman Shan DO Entered By: Kalman Shan on 03/22/2021 09:28:35 -------------------------------------------------------------------------------- Debridement Details Patient Name: Date of Service: [name], [name] 03/22/2021 8:30 A M Medical Record Number: [id number] Patient Account Number: [id number] Date of Birth/Sex: Treating RN: May 10, 1951 (69 y.o. Ernestene Mention Primary Care Provider: Leanna Battles Other Clinician: Referring Provider: Treating Provider/Extender: Darrold Junker in Treatment: 15 Debridement Performed for Assessment: Wound #1 Left,Plantar T Great oe Performed By: Physician Kalman Shan, DO Debridement Type: Debridement Severity of Tissue Pre Debridement: Fat layer exposed Level of Consciousness (Pre-procedure): Awake and Alert Pre-procedure Verification/Time Out Yes - 09:00 Taken: Start Time: 09:00 Pain Control: Lidocaine 4% T opical Solution T Area Debrided (L x W): otal 0.3 (cm) x 0.3 (cm) = 0.09 (cm) Tissue and other material debrided: Non-Viable, Callus, Skin: Epidermis Level: Skin/Epidermis Debridement Description: Selective/Open Wound Instrument: Curette Bleeding: Minimum Hemostasis Achieved: Pressure End Time: 09:03 Procedural Pain: 0 Post Procedural Pain: 0 Response to Treatment: Procedure was tolerated  well Level of Consciousness (Post- Awake and Alert procedure): Post Debridement Measurements of Total Wound Length: (cm) 0.3 Width: (cm) 0.3 Depth: (cm) 0.1 Volume: (cm) 0.007 Character of Wound/Ulcer Post Debridement: Improved Severity of Tissue Post Debridement: Fat layer exposed Post Procedure Diagnosis Same as Pre-procedure Electronic Signature(s) Signed: 03/22/2021 9:32:21 AM By: Kalman Shan DO Signed: 03/22/2021 2:27:43 PM By: Baruch Gouty RN, BSN Entered By: Baruch Gouty on 03/22/2021 09:02:56 -------------------------------------------------------------------------------- HPI Details Patient Name: Date of Service: [name], [name] 03/22/2021 8:30 A M Medical Record Number: [id number] Patient Account Number: [id number] Date of Birth/Sex: Treating RN: [birth year] (70 y.o. 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 He presents to the clinic today for a left great toe wound. He states that he stepped on something sharp that sliced the top part of his left great toe back in December 2021. He has been evaluated by his primary care physician and reports taking 2 rounds of antibiotics. He recently saw podiatry and the toe was debrided and padded. He is not currently doing any dressing changes to the wound. He denies increased warmth or erythema to the foot. He denies fever/chills, purulent drainage or nausea/vomiting. 12/13/2020: Patient presents for 1 week follow-up. He was evaluated last week for his left great toe ulcer and started on Hydrofera Blue. I sent him for ABIs with TBI's and  he had this done. He also started using a surgical shoe to help with pressure relief on the great toe. He has tolerated this well. He states he saw his podiatrist yesterday for follow-up. They Have referred him to see Dr. Earleen Newport. They are also planning to do a skin graft to the wound that if it has not resolved in the next  month. Overall patient states he feels well and has no complaints today. 5/5; patient presents for 1 week follow-up. He reports no issues today. He has been using Hydrofera Blue. He states he saw Dr. Earleen Newport and had a Imaging of his lower extremities. He states he feels well overall. He is going on a trip to [place] in 1-2 weeks. 5/17; patient presents for follow-up. Its been almost 2 weeks since he was last seen. He has been using Hydrofera Blue every other day. He overall feels well and denies signs of infection. He is leaving for a month-long trip to [place] tomorrow. 6/21; patient presents for 1 month follow-up. He recently went on a 1 month trip to [place]. He had a relaxing and enjoyable time. He has been using Hydrofera Blue daily on the wound. He recently ran out of supplies and has been keeping it covered with a Band-Aid. He denies signs of infection and has no issues or complaints today. 7/5; patient presents for 2-week follow-up. He has been using Hydrofera Blue to the wound bed. He is wearing closed toed shoes and states he is not using his sandals anymore. He reports walking barefoot but keeps the wound area covered. He denies signs of infection. 7/29; patient presents for 2-week follow-up. He has been using Hydrofera Blue to the wound bed. He has been on his feet more lately due to work. But he reports continued improvement to the wound area. He denies signs of infection. Electronic Signature(s) Signed: 03/22/2021 9:32:21 AM By: Kalman Shan DO Entered By: Kalman Shan on 03/22/2021  09:29:31 -------------------------------------------------------------------------------- Physical Exam Details Patient Name: Date of Service: [name], [name] 03/22/2021 8:30 A M Medical Record Number: [id number] Patient Account Number: [id number] Date of Birth/Sex: Treating RN: 29-Jul-1951 (70 y.o. Ernestene Mention Primary Care Provider: Leanna Battles Other Clinician: Referring Provider: Treating Provider/Extender: Darrold Junker in Treatment: 15 Constitutional respirations regular, non-labored and within target range for patient.Marland Kitchen Psychiatric pleasant and cooperative. Notes Left foot: Plantar great toe with small open wound. Slight undermining circumferentially. No signs of infection. Electronic Signature(s) Signed: 03/22/2021 9:32:21 AM By: Kalman Shan DO Entered By: Kalman Shan on 03/22/2021 09:30:11 -------------------------------------------------------------------------------- Physician Orders Details Patient Name: Date of Service: [name], [name] 03/22/2021 8:30 A M Medical Record Number: [id number] Patient Account Number: [id number] Date of Birth/Sex: Treating RN: 18-Oct-1950 (70 y.o. Ernestene Mention Primary Care Provider: Leanna Battles Other Clinician: Referring Provider: Treating Provider/Extender: Darrold Junker in Treatment: 15 Verbal / Phone Orders: No Diagnosis Coding ICD-10 Coding Code Description E11.621 Type 2 diabetes mellitus with foot ulcer E11.40 Type 2 diabetes mellitus with diabetic neuropathy, unspecified Z86.718 Personal history of other venous thrombosis and embolism I10 Essential (primary) hypertension Follow-up Appointments Return appointment in 3 weeks. - with Dr. Heber [place] Bathing/ Shower/ Hygiene May shower with protection but do not get wound dressing(s) wet. - use cast protector when not changing the dressing. May shower and wash wound with soap and water. - with  dressing changes only. Edema Control - Lymphedema / SCD / Other Elevate legs to the level of the heart or above for 30 minutes daily and/or when sitting, a frequency of: - throughout the day. Avoid standing for long periods of time. Moisturize legs daily. - both legs every night before bed. Wound Treatment Wound #1 - T Great oe Wound Laterality: Plantar, Left Cleanser: Soap and Water Every Other Day/15 Days Discharge Instructions: May shower and wash wound with dial  antibacterial soap and water prior to dressing change. Cleanser: Wound Cleanser (Generic) Every Other Day/15 Days Discharge Instructions: Cleanse the wound with wound cleanser prior to applying a clean dressing using gauze sponges, not tissue or cotton balls. Prim Dressing: Hydrofera Blue Classic Foam, 2x2 in (Generic) Every Other Day/15 Days ary Discharge Instructions: Moisten with saline prior to applying to wound bed Prim Dressing: Santyl Ointment Every Other Day/15 Days ary Discharge Instructions: ****APPLY ONLY IN CLINIC.***Apply nickel thick amount to wound bed as instructed Secondary Dressing: Woven Gauze Sponge, Non-Sterile 4x4 in (Generic) Every Other Day/15 Days Discharge Instructions: Apply over primary dressing as directed. Secondary Dressing: Optifoam Non-Adhesive Dressing, 4x4 in (Generic) Every Other Day/15 Days Discharge Instructions: **Foam donut as secondary.** Apply over primary dressing as directed. Secured With: Child psychotherapist, Sterile 2x75 (in/in) (Generic) Every Other Day/15 Days Discharge Instructions: Secure with stretch gauze as directed. Secured With: 37M Medipore H Soft Cloth Surgical Tape, 2x2 (in/yd) (Generic) Every Other Day/15 Days Discharge Instructions: Secure dressing with tape as directed. Electronic Signature(s) Signed: 03/22/2021 9:32:21 AM By: Kalman Shan DO Entered By: Kalman Shan on 03/22/2021  09:30:27 -------------------------------------------------------------------------------- Problem List Details Patient Name: Date of Service: [name], [name] 03/22/2021 8:30 A M Medical Record Number: [id number] Patient Account Number: [id number] Date of Birth/Sex: Treating RN: 1951/03/03 (70 y.o. Ernestene Mention Primary Care Provider: Leanna Battles Other Clinician: Referring Provider: Treating Provider/Extender: Darrold Junker in Treatment: 15 Active Problems ICD-10 Encounter Code Description Active Date MDM Diagnosis E11.621 Type 2 diabetes mellitus with foot ulcer 12/07/2020 No Yes E11.40 Type 2 diabetes mellitus with diabetic neuropathy, unspecified 12/07/2020 No Yes Z86.718 Personal history of other venous thrombosis and embolism 12/07/2020 No Yes I10 Essential (primary) hypertension 12/07/2020 No Yes Inactive Problems Resolved Problems Electronic Signature(s) Signed: 03/22/2021 9:32:21 AM By: Kalman Shan DO Entered By: Kalman Shan on 03/22/2021 09:27:15 -------------------------------------------------------------------------------- Progress Note Details Patient Name: Date of Service: [name], [name] 03/22/2021 8:30 A M Medical Record Number: [id number] Patient Account Number: [id number] Date of Birth/Sex: Treating RN: 03/02/1951 (69 y.o. 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 He presents to the clinic today for a left great toe wound. He states that he  stepped on something sharp that sliced the top part of his left great toe back in December 2021. He has been evaluated by his primary care physician and reports taking 2 rounds of antibiotics. He recently saw podiatry and the toe was debrided and padded. He is not currently doing any dressing changes to the wound. He denies increased warmth or erythema to the foot. He denies fever/chills, purulent drainage or nausea/vomiting. 12/13/2020: Patient presents for 1 week follow-up. He was evaluated last week for his left great toe ulcer and started on Hydrofera Blue. I sent him for ABIs with TBI's and he had this done. He also started using a surgical shoe to help with pressure relief on the great toe. He has tolerated this well. He states he saw his podiatrist yesterday for follow-up. They Have referred him to see Dr. Earleen Newport. They are also planning to do a skin graft to the  wound that if it has not resolved in the next month. Overall patient states he feels well and has no complaints today. 5/5; patient presents for 1 week follow-up. He reports no issues today. He has been using Hydrofera Blue. He states he saw Dr. Earleen Newport and had a Imaging of his lower extremities. He states he feels well overall. He is going on a trip to [place] in 1-2 weeks. 5/17; patient presents for follow-up. Its been almost 2 weeks since he was last seen. He has been using Hydrofera Blue every other day. He overall feels well and denies signs of infection. He is leaving for a month-long trip to [place] tomorrow. 6/21; patient presents for 1 month follow-up. He recently went on a 1 month trip to [place]. He had a relaxing and enjoyable time. He has been using Hydrofera Blue daily on the wound. He recently ran out of supplies and has been keeping it covered with a Band-Aid. He denies signs of infection and has no issues or complaints today. 7/5; patient presents for 2-week follow-up. He has been using Hydrofera Blue to the wound bed. He is  wearing closed toed shoes and states he is not using his sandals anymore. He reports walking barefoot but keeps the wound area covered. He denies signs of infection. 7/29; patient presents for 2-week follow-up. He has been using Hydrofera Blue to the wound bed. He has been on his feet more lately due to work. But he reports continued improvement to the wound area. He denies signs of infection. Patient History Information obtained from Patient. Family History Heart Disease, No family history of Cancer, Diabetes, Hereditary Spherocytosis, Hypertension, Kidney Disease, Lung Disease, Seizures, Stroke, Thyroid Problems, Tuberculosis. Social History Never smoker, Marital Status - Married, Alcohol Use - Rarely - prior history, Drug Use - No History, Caffeine Use - Rarely. Medical History Eyes Denies history of Cataracts, Glaucoma, Optic Neuritis Ear/Nose/Mouth/Throat Denies history of Chronic sinus problems/congestion, Middle ear problems Hematologic/Lymphatic Denies history of Anemia, Hemophilia, Human Immunodeficiency Virus, Lymphedema, Sickle Cell Disease Respiratory Denies history of Aspiration, Asthma, Chronic Obstructive Pulmonary Disease (COPD), Pneumothorax, Sleep Apnea, Tuberculosis Cardiovascular Patient has history of Deep Vein Thrombosis - hx DVT Hypertension , Denies history of Angina, Arrhythmia, Congestive Heart Failure, Coronary Artery Disease, Hypotension, Myocardial Infarction, Peripheral Arterial Disease, Peripheral Venous Disease, Phlebitis, Vasculitis Gastrointestinal Denies history of Cirrhosis , Colitis, Crohnoos, Hepatitis A, Hepatitis B, Hepatitis C Endocrine Patient has history of Type II Diabetes Denies history of Type I Diabetes Genitourinary Denies history of End Stage Renal Disease Immunological Denies history of Lupus Erythematosus, Raynaudoos, Scleroderma Integumentary (Skin) Denies history of History of Burn Musculoskeletal Patient has history of  Rheumatoid Arthritis Denies history of Gout, Osteoarthritis, Osteomyelitis Neurologic Patient has history of Neuropathy Denies history of Dementia, Quadriplegia, Paraplegia, Seizure Disorder Oncologic Denies history of Received Chemotherapy, Received Radiation Hospitalization/Surgery History - spine surgery. Medical A Surgical History Notes nd Eyes DM retinopathy Objective Constitutional respirations regular, non-labored and within target range for patient.. Vitals Time Taken: 8:37 AM, Height: 71 in, Weight: 255 lbs, BMI: 35.6, Temperature: 97.8 F, Pulse: 86 bpm, Respiratory Rate: 18 breaths/min, Blood Pressure: 149/75 mmHg. Psychiatric pleasant and cooperative. General Notes: Left foot: Plantar great toe with small open wound. Slight undermining circumferentially. No signs of infection. Integumentary (Hair, Skin) Wound #1 status is Open. Original cause of wound was Trauma. The date acquired was: 08/10/2020. The wound has been in treatment 15 weeks. The wound is located on the [employer]. The wound measures 0.1cm  length x 0.1cm width x 0.1cm depth; 0.008cm^2 area and 0.001cm^3 volume. There is Fat oe Layer (Subcutaneous Tissue) exposed. There is no tunneling or undermining noted. There is a medium amount of serosanguineous drainage noted. The wound margin is well defined and not attached to the wound base. There is medium (34-66%) red, pink granulation within the wound bed. There is a medium (34-66%) amount of necrotic tissue within the wound bed including Adherent Slough. Assessment Active Problems ICD-10 Type 2 diabetes mellitus with foot ulcer Type 2 diabetes mellitus with diabetic neuropathy, unspecified Personal history of other venous thrombosis and embolism Essential (primary) hypertension Patient's wound looks improved since last clinic visit. It is now a small opening to the distal portion of his left great toe. I debrided nonviable tissue. I recommended  continuing Hydrofera Blue. No signs of infection on exam. Follow-up in 3 weeks. I am hopeful that this will be closed by then. Procedures Wound #1 Pre-procedure diagnosis of Wound #1 is a Diabetic Wound/Ulcer of the Lower Extremity located on the Left,Plantar T Great .Severity of Tissue Pre oe Debridement is: Fat layer exposed. There was a Selective/Open Wound Skin/Epidermis Debridement with a total area of 0.09 sq cm performed by Kalman Shan, DO. With the following instrument(s): Curette to remove Non-Viable tissue/material. Material removed includes Callus and Skin: Epidermis and after achieving pain control using Lidocaine 4% Topical Solution. No specimens were taken. A time out was conducted at 09:00, prior to the start of the procedure. A Minimum amount of bleeding was controlled with Pressure. The procedure was tolerated well with a pain level of 0 throughout and a pain level of 0 following the procedure. Post Debridement Measurements: 0.3cm length x 0.3cm width x 0.1cm depth; 0.007cm^3 volume. Character of Wound/Ulcer Post Debridement is improved. Severity of Tissue Post Debridement is: Fat layer exposed. Post procedure Diagnosis Wound #1: Same as Pre-Procedure Plan Follow-up Appointments: Return appointment in 3 weeks. - with Dr. Heber [place] Bathing/ Shower/ Hygiene: May shower with protection but do not get wound dressing(s) wet. - use cast protector when not changing the dressing. May shower and wash wound with soap and water. - with dressing changes only. Edema Control - Lymphedema / SCD / Other: Elevate legs to the level of the heart or above for 30 minutes daily and/or when sitting, a frequency of: - throughout the day. Avoid standing for long periods of time. Moisturize legs daily. - both legs every night before bed. WOUND #1: - T Great Wound Laterality: Plantar, Left oe Cleanser: Soap and Water Every Other Day/15 Days Discharge Instructions: May shower and wash wound with  dial antibacterial soap and water prior to dressing change. Cleanser: Wound Cleanser (Generic) Every Other Day/15 Days Discharge Instructions: Cleanse the wound with wound cleanser prior to applying a clean dressing using gauze sponges, not tissue or cotton balls. Prim Dressing: Hydrofera Blue Classic Foam, 2x2 in (Generic) Every Other Day/15 Days ary Discharge Instructions: Moisten with saline prior to applying to wound bed Prim Dressing: Santyl Ointment Every Other Day/15 Days ary Discharge Instructions: ****APPLY ONLY IN CLINIC.***Apply nickel thick amount to wound bed as instructed Secondary Dressing: Woven Gauze Sponge, Non-Sterile 4x4 in (Generic) Every Other Day/15 Days Discharge Instructions: Apply over primary dressing as directed. Secondary Dressing: Optifoam Non-Adhesive Dressing, 4x4 in (Generic) Every Other Day/15 Days Discharge Instructions: **Foam donut as secondary.** Apply over primary dressing as directed. Secured With: Child psychotherapist, Sterile 2x75 (in/in) (Generic) Every Other Day/15 Days Discharge Instructions: Secure with stretch gauze as  directed. Secured With: 34M Medipore H Soft Cloth Surgical T ape, 2x2 (in/yd) (Generic) Every Other Day/15 Days Discharge Instructions: Secure dressing with tape as directed. 1. In office sharp debridement 2. Hydrofera Blue 3. Follow-up in 3 weeks Electronic Signature(s) Signed: 03/22/2021 9:32:21 AM By: Kalman Shan DO Entered By: Kalman Shan on 03/22/2021 09:31:53 -------------------------------------------------------------------------------- HxROS Details Patient Name: Date of Service: [name], [name] 03/22/2021 8:30 A M Medical Record Number: [id number] Patient Account Number: [id number] Date of Birth/Sex: Treating RN: May 23, 1951 (70 y.o. Ernestene Mention Primary Care Provider: Leanna Battles Other Clinician: Referring Provider: Treating Provider/Extender: Darrold Junker in Treatment: 15 Information Obtained From Patient Eyes Medical History: Negative for: Cataracts; Glaucoma; Optic Neuritis Past Medical History Notes: DM retinopathy Ear/Nose/Mouth/Throat Medical History: Negative for: Chronic sinus problems/congestion; Middle ear problems Hematologic/Lymphatic Medical History: Negative for: Anemia; Hemophilia; Human Immunodeficiency Virus; Lymphedema; Sickle Cell Disease Respiratory Medical History: Negative for: Aspiration; Asthma; Chronic Obstructive Pulmonary Disease (COPD); Pneumothorax; Sleep Apnea; Tuberculosis Cardiovascular Medical History: Positive for: Deep Vein Thrombosis - hx DVT Hypertension ; Negative for: Angina; Arrhythmia; Congestive Heart Failure; Coronary Artery Disease; Hypotension; Myocardial Infarction; Peripheral Arterial Disease; Peripheral Venous Disease; Phlebitis; Vasculitis Gastrointestinal Medical History: Negative for: Cirrhosis ; Colitis; Crohns; Hepatitis A; Hepatitis B; Hepatitis C Endocrine Medical History: Positive for: Type II Diabetes Negative for: Type I Diabetes Treated with: Insulin, Oral agents Blood sugar tested every day: No Genitourinary Medical History: Negative for: End Stage Renal Disease Immunological Medical History: Negative for: Lupus Erythematosus; Raynauds; Scleroderma Integumentary (Skin) Medical History: Negative for: History of Burn Musculoskeletal Medical History: Positive for: Rheumatoid Arthritis Negative for: Gout; Osteoarthritis; Osteomyelitis Neurologic Medical History: Positive for: Neuropathy Negative for: Dementia; Quadriplegia; Paraplegia; Seizure Disorder Oncologic Medical History: Negative for: Received Chemotherapy; Received Radiation Immunizations Pneumococcal Vaccine: Received Pneumococcal Vaccination: No Implantable Devices None Hospitalization / Surgery History Type of Hospitalization/Surgery spine surgery Family and Social  History Cancer: No; Diabetes: No; Heart Disease: Yes; Hereditary Spherocytosis: No; Hypertension: No; Kidney Disease: No; Lung Disease: No; Seizures: No; Stroke: No; Thyroid Problems: No; Tuberculosis: No; Never smoker; Marital Status - Married; Alcohol Use: Rarely - prior history; Drug Use: No History; Caffeine Use: Rarely; Financial Concerns: No; Food, Clothing or Shelter Needs: No; Support System Lacking: No; Transportation Concerns: No Electronic Signature(s) Signed: 03/22/2021 9:32:21 AM By: Kalman Shan DO Signed: 03/22/2021 2:27:43 PM By: Baruch Gouty RN, BSN Entered By: Kalman Shan on 03/22/2021 09:29:38 -------------------------------------------------------------------------------- [place] Details Patient Name: Date of Service: [name], [name] 03/22/2021 Medical Record Number: [id number] Patient Account Number: [id number] Date of Birth/Sex: Treating RN: 1950-09-11 (70 y.o. Ernestene Mention Primary Care Provider: Leanna Battles Other Clinician: Referring Provider: Treating Provider/Extender: Darrold Junker in Treatment: 15 Diagnosis Coding ICD-10 Codes Code Description E11.621 Type 2 diabetes mellitus with foot ulcer E11.40 Type 2 diabetes mellitus with diabetic neuropathy, unspecified Z86.718 Personal history of other venous thrombosis and embolism I10 Essential (primary) hypertension Facility Procedures CPT4 Code: [id number] Description: [id number] - DEBRIDE WOUND 1ST 20 SQ CM OR < ICD-10 Diagnosis Description E11.621 Type 2 diabetes mellitus with foot ulcer Modifier: Quantity: 1 Physician Procedures : CPT4 Code Description Modifier [id number] - WC PHYS DEBR WO ANESTH 20 SQ CM ICD-10 Diagnosis Description E11.621 Type 2 diabetes mellitus with foot ulcer Quantity: 1 Electronic Signature(s) Signed: 03/22/2021 9:32:21 AM By: Kalman Shan DO Entered By: Kalman Shan on 03/22/2021 09:32:01

## 2021-03-25 NOTE — Progress Notes (Signed)
KALLUM, JORGENSEN ([id number]) Visit Report for 03/22/2021 Arrival Information Details Patient Name: Date of Service: [place], [name] 03/22/2021 8:30 A M Medical Record Number: [id number] Patient Account Number: [id number] Date of Birth/Sex: Treating RN: 10-20-1950 (70 y.o. Ulyses Amor, Vaughan Basta Primary Care Lachlyn Vanderstelt: Leanna Battles Other Clinician: Referring Naryiah Schley: Treating Phelicia Dantes/Extender: Darrold Junker in Treatment: 15 Visit Information History Since Last Visit Added or deleted any medications: No Patient Arrived: Ambulatory Any new allergies or adverse reactions: No Arrival Time: 08:37 Had a fall or experienced change in No Accompanied By: self activities of daily living that may affect Transfer Assistance: None risk of falls: Patient Identification Verified: Yes Signs or symptoms of abuse/neglect since last visito No Secondary Verification Process Completed: Yes Hospitalized since last visit: No Patient Requires Transmission-Based Precautions: No Implantable device outside of the clinic excluding No Patient Has Alerts: No cellular tissue based products placed in the center since last visit: Has Dressing in Place as Prescribed: Yes Pain Present Now: No Electronic Signature(s) Signed: 03/25/2021 1:33:39 PM By: Sandre Kitty Entered By: Sandre Kitty on 03/22/2021 08:37:19 -------------------------------------------------------------------------------- Encounter Discharge Information Details Patient Name: Date of Service: [name], [name] 03/22/2021 8:30 A M Medical Record Number: [id number] Patient Account Number: [id number] Date of Birth/Sex: Treating RN: [birth year] (70 y.o. Janyth Contes Primary Care Rai Severns: Leanna Battles Other Clinician: Referring Chancellor Vanderloop: Treating Dustine Bertini/Extender: Darrold Junker in Treatment: 15 Encounter Discharge Information Items Post Procedure Vitals Discharge  Condition: Stable Temperature (F): 97.8 Ambulatory Status: Ambulatory Pulse (bpm): 86 Discharge Destination: Home Respiratory Rate (breaths/min): 16 Transportation: Private Auto Blood Pressure (mmHg): 149/75 Accompanied By: alone Schedule Follow-up Appointment: Yes Clinical Summary of Care: Patient Declined Electronic Signature(s) Signed: 03/25/2021 5:51:46 PM By: Levan Hurst RN, BSN Entered By: Levan Hurst on 03/22/2021 12:58:05 -------------------------------------------------------------------------------- Lower Extremity Assessment Details Patient Name: Date of Service: [name], [name] 03/22/2021 8:30 A M Medical Record Number: [id number] Patient Account Number: [id number] Date of Birth/Sex: Treating RN: 1951-02-04 (70 y.o. [name] Primary Care Amani Marseille: Leanna Battles Other Clinician: Referring Maylene Crocker: Treating Kafi Dotter/Extender: Darrold Junker in Treatment: 15 Edema Assessment Assessed: Shirlyn Goltz: Yes] Patrice Paradise: No] Edema: [Left: N] [Right: o] Calf Left: Right: Point of Measurement: 34 cm From Medial Instep 39.5 cm Ankle Left: Right: Point of Measurement: 12 cm From Medial Instep 23.5 cm Vascular Assessment Pulses: Dorsalis Pedis Palpable: [Left:Yes] Electronic Signature(s) Signed: 03/22/2021 1:14:47 PM By: Deon Pilling Entered By: Deon Pilling on 03/22/2021 08:45:03 -------------------------------------------------------------------------------- Multi Wound Chart Details Patient Name: Date of Service: [name], [name] 03/22/2021 8:30 A M Medical Record Number: [id number] Patient Account Number: [id number] Date of Birth/Sex: Treating RN: 06-01-1951 (70 y.o. Ernestene Mention Primary Care Jaymie Misch: Leanna Battles Other Clinician: Referring Farid Grigorian: Treating Lenea Bywater/Extender: Darrold Junker in Treatment: 15 Vital Signs Height(in): 8 Pulse(bpm): 14 Weight(lbs): 255 Blood  Pressure(mmHg): 149/75 Body Mass Index(BMI): 36 Temperature(F): 97.8 Respiratory Rate(breaths/min): 18 Photos: [1:Left, Plantar T Great oe] [N/A:N/A N/A] Wound Location: [1:Trauma] [N/A:N/A] Wounding Event: [1:Diabetic Wound/Ulcer of the Lower] [N/A:N/A] Primary Etiology: [1:Extremity Deep Vein Thrombosis, Hypertension, N/A] Comorbid History: [1:Type II Diabetes, Rheumatoid Arthritis, Neuropathy 08/10/2020] [N/A:N/A] Date Acquired: [1:15] [N/A:N/A] Weeks of Treatment: [1:Open] [N/A:N/A] Wound Status: [1:0.1x0.1x0.1] [N/A:N/A] Measurements L x W x D (cm) [1:0.008] [N/A:N/A] A (cm) : rea [1:0.001] [N/A:N/A] Volume (cm) : [1:99.20%] [N/A:N/A] % Reduction in A [1:rea: 98.90%] [N/A:N/A] % Reduction in Volume: [1:Grade 2] [N/A:N/A] Classification: [1:Medium] [N/A:N/A]  Exudate A mount: [1:Serosanguineous] [N/A:N/A] Exudate Type: [1:red, brown] [N/A:N/A] Exudate Color: [1:Well defined, not attached] [N/A:N/A] Wound Margin: [1:Medium (34-66%)] [N/A:N/A] Granulation A mount: [1:Red, Pink] [N/A:N/A] Granulation Quality: [1:Medium (34-66%)] [N/A:N/A] Necrotic A mount: [1:Fat Layer (Subcutaneous Tissue): Yes N/A] Exposed Structures: [1:Fascia: No Tendon: No Muscle: No Joint: No Bone: No Large (67-100%)] [N/A:N/A] Epithelialization: [1:Debridement - Selective/Open Wound N/A] Debridement: Pre-procedure Verification/Time Out 09:00 [N/A:N/A] Taken: [1:Lidocaine 4% Topical Solution] [N/A:N/A] Pain Control: [1:Callus] [N/A:N/A] Tissue Debrided: [1:Skin/Epidermis] [N/A:N/A] Level: [1:0.09] [N/A:N/A] Debridement A (sq cm): [1:rea Curette] [N/A:N/A] Instrument: [1:Minimum] [N/A:N/A] Bleeding: [1:Pressure] [N/A:N/A] Hemostasis A chieved: [1:0] [N/A:N/A] Procedural Pain: [1:0] [N/A:N/A] Post Procedural Pain: [1:Procedure was tolerated well] [N/A:N/A] Debridement Treatment Response: [1:0.3x0.3x0.1] [N/A:N/A] Post Debridement Measurements L x W x D (cm) [1:0.007] [N/A:N/A] Post Debridement  Volume: (cm) [1:Debridement] [N/A:N/A] Treatment Notes Electronic Signature(s) Signed: 03/22/2021 9:32:21 AM By: Kalman Shan DO Signed: 03/22/2021 2:27:43 PM By: Baruch Gouty RN, BSN Entered By: Kalman Shan on 03/22/2021 09:27:21 -------------------------------------------------------------------------------- Multi-Disciplinary Care Plan Details Patient Name: Date of Service: [name], [name] 03/22/2021 8:30 A M Medical Record Number: [id number] Patient Account Number: [id number] Date of Birth/Sex: Treating RN: 07-08-1951 (70 y.o. Ernestene Mention Primary Care Rosevelt Luu: Leanna Battles Other Clinician: Referring Nella Botsford: Treating Kamarian Sahakian/Extender: Darrold Junker in Treatment: [place] reviewed with physician Active Inactive Nutrition Nursing Diagnoses: Impaired glucose control: actual or potential Goals: Patient/caregiver verbalizes understanding of need to maintain therapeutic glucose control per primary care physician Date Initiated: 12/07/2020 Target Resolution Date: 04/19/2021 Goal Status: Active Interventions: Assess HgA1c results as ordered upon admission and as needed Assess patient nutrition upon admission and as needed per policy Provide education on elevated blood sugars and impact on wound healing Treatment Activities: Obtain HgA1c : 12/07/2020 Notes: Wound/Skin Impairment Nursing Diagnoses: Impaired tissue integrity Goals: Patient/caregiver will verbalize understanding of skin care regimen Date Initiated: 12/07/2020 Target Resolution Date: 04/19/2021 Goal Status: Active Ulcer/skin breakdown will have a volume reduction of 30% by week 4 Date Initiated: 12/07/2020 Date Inactivated: 03/22/2021 Target Resolution Date: 03/19/2021 Goal Status: Met Ulcer/skin breakdown will have a volume reduction of 50% by week 8 Date Initiated: 03/22/2021 Target Resolution Date: 04/19/2021 Goal Status:  Active Interventions: Assess patient/caregiver ability to obtain necessary supplies Assess patient/caregiver ability to perform ulcer/skin care regimen upon admission and as needed Assess ulceration(s) every visit Provide education on ulcer and skin care Treatment Activities: Skin care regimen initiated : 12/07/2020 Topical wound management initiated : 12/07/2020 Notes: Electronic Signature(s) Signed: 03/22/2021 2:27:43 PM By: Baruch Gouty RN, BSN Entered By: Baruch Gouty on 03/22/2021 09:05:21 -------------------------------------------------------------------------------- Pain Assessment Details Patient Name: Date of Service: [name], [name] 03/22/2021 8:30 A M Medical Record Number: [id number] Patient Account Number: [id number] Date of Birth/Sex: Treating RN: 1951/05/19 (70 y.o. Ernestene Mention Primary Care Ilyas Lipsitz: Leanna Battles Other Clinician: Referring Lavene Penagos: Treating Deneshia Zucker/Extender: Darrold Junker in Treatment: 15 Active Problems Location of Pain Severity and Description of Pain Patient Has Paino No Site Locations Pain Management and Medication Current Pain Management: Electronic Signature(s) Signed: 03/22/2021 2:27:43 PM By: Baruch Gouty RN, BSN Signed: 03/25/2021 1:33:39 PM By: Sandre Kitty Entered By: Sandre Kitty on 03/22/2021 08:37:45 -------------------------------------------------------------------------------- Patient/Caregiver Education Details Patient Name: Date of Service: Montez Morita, [name] 7/29/2022andnbsp8:30 A M Medical Record Number: [id number] Patient Account Number: [id number] Date of Birth/Gender: Treating RN: December 14, 1950 (70 y.o. Ernestene Mention Primary Care Physician: Leanna Battles Other Clinician: Referring Physician: Treating Physician/Extender: Kalman Shan  Carmelia Bake in Treatment: 15 Education Assessment Education Provided To: Patient Education Topics  Provided Elevated Blood Sugar/ Impact on Healing: Methods: Explain/Verbal Responses: Reinforcements needed, State content correctly Wound/Skin Impairment: Methods: Explain/Verbal Responses: Reinforcements needed, State content correctly Electronic Signature(s) Signed: 03/22/2021 2:27:43 PM By: Baruch Gouty RN, BSN Entered By: Baruch Gouty on 03/22/2021 09:05:44 -------------------------------------------------------------------------------- Wound Assessment Details Patient Name: Date of Service: [name], [name] 03/22/2021 8:30 A M Medical Record Number: [id number] Patient Account Number: [id number] Date of Birth/Sex: Treating RN: 17-Jan-1951 (70 y.o. Ernestene Mention Primary Care Sarahy Creedon: Leanna Battles Other Clinician: Referring Sarinity Dicicco: Treating Aymen Widrig/Extender: Darrold Junker in Treatment: 15 Wound Status Wound Number: 1 Primary Diabetic Wound/Ulcer of the Lower Extremity Etiology: Wound Location: Left, Plantar T Great oe Wound Open Wounding Event: Trauma Status: Date Acquired: 08/10/2020 Comorbid Deep Vein Thrombosis, Hypertension, Type II Diabetes, Weeks Of Treatment: 15 History: Rheumatoid Arthritis, Neuropathy Clustered Wound: No Photos Wound Measurements Length: (cm) 0.1 Width: (cm) 0.1 Depth: (cm) 0.1 Area: (cm) 0.008 Volume: (cm) 0.001 % Reduction in Area: 99.2% % Reduction in Volume: 98.9% Epithelialization: Large (67-100%) Tunneling: No Undermining: No Wound Description Classification: Grade 2 Wound Margin: Well defined, not attached Exudate Amount: Medium Exudate Type: Serosanguineous Exudate Color: red, brown Foul Odor After Cleansing: No Slough/Fibrino Yes Wound Bed Granulation Amount: Medium (34-66%) Exposed Structure Granulation Quality: Red, Pink Fascia Exposed: No Necrotic Amount: Medium (34-66%) Fat Layer (Subcutaneous Tissue) Exposed: Yes Necrotic Quality: Adherent Slough Tendon Exposed:  No Muscle Exposed: No Joint Exposed: No Bone Exposed: No Treatment Notes Wound #1 (Toe Great) Wound Laterality: Plantar, Left Cleanser Soap and Water Discharge Instruction: May shower and wash wound with dial antibacterial soap and water prior to dressing change. Wound Cleanser Discharge Instruction: Cleanse the wound with wound cleanser prior to applying a clean dressing using gauze sponges, not tissue or cotton balls. Peri-Wound Care Topical Primary Dressing Hydrofera Blue Classic Foam, 2x2 in Discharge Instruction: Moisten with saline prior to applying to wound bed Santyl Ointment Discharge Instruction: ****APPLY ONLY IN CLINIC.***Apply nickel thick amount to wound bed as instructed Secondary Dressing Woven Gauze Sponge, Non-Sterile 4x4 in Discharge Instruction: Apply over primary dressing as directed. Optifoam Non-Adhesive Dressing, 4x4 in Discharge Instruction: **Foam donut as secondary.** Apply over primary dressing as directed. Secured With Conforming Stretch Gauze Bandage, Sterile 2x75 (in/in) Discharge Instruction: Secure with stretch gauze as directed. 47M Medipore H Soft Cloth Surgical T ape, 2x2 (in/yd) Discharge Instruction: Secure dressing with tape as directed. Compression Wrap Compression Stockings Add-Ons Electronic Signature(s) Signed: 03/22/2021 1:14:47 PM By: Deon Pilling Signed: 03/22/2021 2:27:43 PM By: Baruch Gouty RN, BSN Entered By: Deon Pilling on 03/22/2021 08:45:21 -------------------------------------------------------------------------------- Vitals Details Patient Name: Date of Service: [name], [name] 03/22/2021 8:30 A M Medical Record Number: [id number] Patient Account Number: [id number] Date of Birth/Sex: Treating RN: 1951/05/08 (70 y.o. Ernestene Mention Primary Care Tavious Griesinger: Leanna Battles Other Clinician: Referring Dawson Albers: Treating Mendell Bontempo/Extender: Darrold Junker in Treatment: 15 Vital  Signs Time Taken: 08:37 Temperature (F): 97.8 Height (in): 71 Pulse (bpm): 86 Weight (lbs): 255 Respiratory Rate (breaths/min): 18 Body Mass Index (BMI): 35.6 Blood Pressure (mmHg): 149/75 Reference Range: 80 - 120 mg / dl Electronic Signature(s) Signed: 03/25/2021 1:33:39 PM By: Sandre Kitty Entered By: Sandre Kitty on 03/22/2021 08:37:40

## 2021-04-04 DIAGNOSIS — E1140 Type 2 diabetes mellitus with diabetic neuropathy, unspecified: Secondary | ICD-10-CM | POA: Diagnosis not present

## 2021-04-04 DIAGNOSIS — G4733 Obstructive sleep apnea (adult) (pediatric): Secondary | ICD-10-CM | POA: Diagnosis not present

## 2021-04-04 DIAGNOSIS — R2681 Unsteadiness on feet: Secondary | ICD-10-CM | POA: Diagnosis not present

## 2021-04-04 DIAGNOSIS — Z794 Long term (current) use of insulin: Secondary | ICD-10-CM | POA: Diagnosis not present

## 2021-04-04 DIAGNOSIS — L97529 Non-pressure chronic ulcer of other part of left foot with unspecified severity: Secondary | ICD-10-CM | POA: Diagnosis not present

## 2021-04-04 DIAGNOSIS — Z86711 Personal history of pulmonary embolism: Secondary | ICD-10-CM | POA: Diagnosis not present

## 2021-04-04 DIAGNOSIS — Z7901 Long term (current) use of anticoagulants: Secondary | ICD-10-CM | POA: Diagnosis not present

## 2021-04-04 DIAGNOSIS — E785 Hyperlipidemia, unspecified: Secondary | ICD-10-CM | POA: Diagnosis not present

## 2021-04-04 DIAGNOSIS — E11621 Type 2 diabetes mellitus with foot ulcer: Secondary | ICD-10-CM | POA: Diagnosis not present

## 2021-04-04 DIAGNOSIS — I10 Essential (primary) hypertension: Secondary | ICD-10-CM | POA: Diagnosis not present

## 2021-04-04 DIAGNOSIS — I700 Atherosclerosis of aorta: Secondary | ICD-10-CM | POA: Diagnosis not present

## 2021-04-09 ENCOUNTER — Ambulatory Visit (INDEPENDENT_AMBULATORY_CARE_PROVIDER_SITE_OTHER): Payer: Medicare Other | Admitting: Podiatry

## 2021-04-09 ENCOUNTER — Other Ambulatory Visit: Payer: Self-pay

## 2021-04-09 ENCOUNTER — Encounter: Payer: Self-pay | Admitting: Podiatry

## 2021-04-09 DIAGNOSIS — M79675 Pain in left toe(s): Secondary | ICD-10-CM | POA: Diagnosis not present

## 2021-04-09 DIAGNOSIS — E1142 Type 2 diabetes mellitus with diabetic polyneuropathy: Secondary | ICD-10-CM

## 2021-04-09 DIAGNOSIS — B351 Tinea unguium: Secondary | ICD-10-CM

## 2021-04-09 NOTE — Progress Notes (Signed)
This patient returns to my office for at risk foot care.  This patient requires this care by a professional since this patient will be at risk due to having diabetic neuropathy and history of DVT.   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 This patient is unable to cut nails himself since the patient cannot reach his nails.These nails are painful walking and wearing shoes. He has been under treatment for ulcer on the tip left big toe.  He has been seen by Dr.  Posey Pronto and the wound center.  He presents to the office with bandage on his big toe. This patient presents for at risk foot care today.  General Appearance  Alert, conversant and in no acute stress.  Vascular  Dorsalis pedis and posterior tibial  pulses are palpable  bilaterally.  Capillary return is within normal limits  bilaterally. Temperature is within normal limits  bilaterally.  Neurologic  Senn-Weinstein monofilament wire test within normal limits  Left foot.  LOPS absent trght foot.. Muscle power within normal limits bilaterally.  Nails Thick disfigured discolored nails with subungual debris hallux toenails. No evidence of bacterial infection or drainage bilaterally.  Orthopedic  No limitations of motion  feet .  No crepitus or effusions noted.  No bony pathology or digital deformities noted.  HAV  B/L.  DJD right midfoot.  Skin  normotropic skin with no porokeratosis noted bilaterally.  No signs of infections .  Healing ulcer noted left hallux.  No drainage.  Presently under treatment at wound center.    Onychomycosis  Pain in right toes  Pain in left toes  Consent was obtained for treatment procedures.   Mechanical debridement of nails  hallux  left. performed with a nail nipper.  Filed with dremel without incident.    Return office visit    4 months .                 Told patient to return for periodic foot care and evaluation due to potential at risk complications.   Gardiner Barefoot DPM

## 2021-04-10 DIAGNOSIS — L4050 Arthropathic psoriasis, unspecified: Secondary | ICD-10-CM | POA: Diagnosis not present

## 2021-04-11 ENCOUNTER — Other Ambulatory Visit: Payer: Self-pay

## 2021-04-11 ENCOUNTER — Encounter (HOSPITAL_BASED_OUTPATIENT_CLINIC_OR_DEPARTMENT_OTHER): Payer: Medicare Other | Attending: Internal Medicine | Admitting: Internal Medicine

## 2021-04-11 DIAGNOSIS — E11621 Type 2 diabetes mellitus with foot ulcer: Secondary | ICD-10-CM | POA: Diagnosis not present

## 2021-04-11 DIAGNOSIS — Z794 Long term (current) use of insulin: Secondary | ICD-10-CM | POA: Diagnosis not present

## 2021-04-11 DIAGNOSIS — Z86718 Personal history of other venous thrombosis and embolism: Secondary | ICD-10-CM | POA: Diagnosis not present

## 2021-04-11 DIAGNOSIS — E1142 Type 2 diabetes mellitus with diabetic polyneuropathy: Secondary | ICD-10-CM | POA: Insufficient documentation

## 2021-04-11 DIAGNOSIS — L97529 Non-pressure chronic ulcer of other part of left foot with unspecified severity: Secondary | ICD-10-CM | POA: Diagnosis present

## 2021-04-11 DIAGNOSIS — L97522 Non-pressure chronic ulcer of other part of left foot with fat layer exposed: Secondary | ICD-10-CM | POA: Diagnosis not present

## 2021-04-11 DIAGNOSIS — E1140 Type 2 diabetes mellitus with diabetic neuropathy, unspecified: Secondary | ICD-10-CM

## 2021-04-11 NOTE — Progress Notes (Signed)
LACEDRIC, GAUGH ([id number]) Visit Report for 04/11/2021 Chief Complaint Document Details Patient Name: Date of Service: [place], [name] 04/11/2021 8:00 A M Medical Record Number: [id number] Patient Account Number: [id number] Date of Birth/Sex: Treating RN: 1951/05/17 (70 y.o. Anthony Case, Anthony Case Primary Care Provider: Leanna Case Other Clinician: Referring Provider: Treating Provider/Extender: Anthony Case in Treatment: 17 Information Obtained from: Patient Chief Complaint Left great toe wound Electronic Signature(s) Signed: 04/11/2021 8:42:43 AM By: Anthony Shan DO Entered By: Anthony Case on 04/11/2021 08:35:59 -------------------------------------------------------------------------------- HPI Details Patient Name: Date of Service: [place] Anthony Case, [name] 04/11/2021 8:00 A M Medical Record Number: [id number] Patient Account Number: [id number] Date of Birth/Sex: Treating RN: [birth year] (70 y.o. 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 He presents to the clinic today for a left great toe wound. He states that he stepped on something sharp that sliced the top part of his left great toe back in December 2021. He has been evaluated by his primary care physician and reports taking 2 rounds of antibiotics. He recently saw podiatry and the toe was debrided and padded. He is not currently doing any dressing changes to the wound. He denies increased warmth or erythema to the foot. He denies fever/chills, purulent drainage or nausea/vomiting. 12/13/2020:  Patient presents for 1 week follow-up. He was evaluated last week for his left great toe ulcer and started on Hydrofera Blue. I sent him for ABIs with TBI's and he had this done. He also started using a surgical shoe to help with pressure relief on the great toe. He has tolerated this well. He states he saw his podiatrist yesterday for follow-up. They Have referred him to see Anthony Case. They are also planning to do a skin graft to the wound that if it has not resolved in the next month. Overall patient states he feels well and has no complaints today. 5/5; patient presents for 1 week follow-up. He reports no issues today. He has been using Hydrofera Blue. He states he saw Anthony Case and had a Imaging of his lower extremities. He states he feels well overall. He is going on a trip to [place] in 1-2 weeks. 5/17; patient presents for follow-up. Its been almost 2 weeks since he was last seen. He has been using Hydrofera Blue every other day. He overall feels well and denies signs of infection. He is leaving for a month-long trip to [place] tomorrow. 6/21; patient presents for 1 month follow-up. He recently went on a 1 month trip to [place]. He had a relaxing and enjoyable time. He has been using Hydrofera Blue daily on the wound. He recently ran out of supplies and has been keeping it covered with a Band-Aid. He denies signs of infection and has no issues or complaints today. 7/5; patient presents for 2-week follow-up. He has been using Hydrofera Blue to the wound bed. He is wearing closed toed shoes and states he is not using his sandals anymore. He reports walking barefoot but keeps the wound area covered. He denies signs of infection. 7/29; patient presents for 2-week follow-up. He has been using Hydrofera Blue to the wound bed. He has been on his feet more  lately due to work. But he reports continued improvement to the wound area. He denies signs of infection. 8/18; patient presents for 2-week  follow-up. He has been using Hydrofera Blue to the wound bed however he states this is scabbed over with no drainage and no issues. He denies signs of infection. Electronic Signature(s) Signed: 04/11/2021 8:42:43 AM By: Anthony Shan DO Entered By: Anthony Case on 04/11/2021 08:36:36 -------------------------------------------------------------------------------- Physical Exam Details Patient Name: Date of Service: [place] Anthony Case, [name] 04/11/2021 8:00 A M Medical Record Number: [id number] Patient Account Number: [id number] Date of Birth/Sex: Treating RN: 1951/01/08 (70 y.o. Anthony Case Primary Care Provider: Leanna Case Other Clinician: Referring Provider: Treating Provider/Extender: Anthony Case in Treatment: 17 Constitutional respirations regular, non-labored and within target range for patient.. Cardiovascular 2+ dorsalis pedis/posterior tibialis pulses. Psychiatric pleasant and cooperative. Notes Left foot: Distal end of the great toe with scab. No drainage noted on palpation. No signs of infection. Electronic Signature(s) Signed: 04/11/2021 8:42:43 AM By: Anthony Shan DO Entered By: Anthony Case on 04/11/2021 08:39:25 -------------------------------------------------------------------------------- Physician Orders Details Patient Name: Date of Service: [place] Anthony Case, [name] 04/11/2021 8:00 A M Medical Record Number: [id number] Patient Account Number: [id number] Date of Birth/Sex: Treating RN: 01/22/1951 (70 y.o. Anthony Case Primary Care Provider: Leanna Case Other Clinician: Referring Provider: Treating Provider/Extender: Anthony Case in Treatment: [phone] Verbal / Phone Orders: No Diagnosis Coding ICD-10 Coding Code Description E11.621 Type 2 diabetes mellitus with foot ulcer E11.40 Type 2 diabetes mellitus with diabetic neuropathy, unspecified Z86.718 Personal history of other  venous thrombosis and embolism I10 Essential (primary) hypertension Follow-up Appointments Return appointment in 1 month. - with Dr. Heber Case Bathing/ Shower/ Hygiene May shower and wash wound with soap and water. - with dressing changes only. Edema Control - Lymphedema / SCD / Other Elevate legs to the level of the heart or above for 30 minutes daily and/or when sitting, a frequency of: - throughout the day. Avoid standing for long periods of time. Moisturize legs daily. - both legs every night before bed. Wound Treatment Wound #1 - T Great oe Wound Laterality: Plantar, Left Cleanser: Soap and Water 1 x Per Day/15 Days Discharge Instructions: May shower and wash wound with dial antibacterial soap and water prior to dressing change. Cleanser: Wound Cleanser (Generic) 1 x Per Day/15 Days Discharge Instructions: Cleanse the wound with wound cleanser prior to applying a clean dressing using gauze sponges, not tissue or cotton balls. Topical: Antibiotic Ointment 1 x Per [id number] Days Secondary Dressing: T Cover (patient has) oe 1 x Per [id number] Days Electronic Signature(s) Signed: 04/11/2021 8:42:43 AM By: Anthony Shan DO Entered By: Anthony Case on 04/11/2021 08:39:43 -------------------------------------------------------------------------------- Problem List Details Patient Name: Date of Service: [place] Anthony Case, [name] 04/11/2021 8:00 A M Medical Record Number: [id number] Patient Account Number: [id number] Date of Birth/Sex: Treating RN: 10/11/1950 (70 y.o. Anthony Case, Anthony Case Primary Care Provider: Leanna Case Other Clinician: Referring Provider: Treating Provider/Extender: Anthony Case in Treatment: 17 Active Problems ICD-10 Encounter Code Description Active Date MDM Diagnosis E11.621 Type 2 diabetes mellitus with foot ulcer 12/07/2020 No Yes E11.40 Type 2 diabetes mellitus with diabetic neuropathy, unspecified 12/07/2020 No Yes Z86.718  Personal history of other venous thrombosis and embolism 12/07/2020 No Yes I10 Essential (primary) hypertension 12/07/2020 No Yes Inactive Problems Resolved Problems Electronic Signature(s) Signed: 04/11/2021 8:42:43 AM By: Anthony Shan DO Entered By: Anthony Case on 04/11/2021 08:35:41 -------------------------------------------------------------------------------- Progress Note  Details Patient Name: Date of Service: [place], [name] 04/11/2021 8:00 A M Medical Record Number: [id number] Patient Account Number: [id number] Date of Birth/Sex: Treating RN: 1951/03/30 (70 y.o. 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 He presents to the clinic today for a left great toe wound. He states that he stepped on something sharp that sliced the top part of his left great toe back in December 2021. He has been evaluated by his primary care physician and reports taking 2 rounds of antibiotics. He recently saw podiatry and the toe was debrided and padded. He is not currently doing any dressing changes to the wound. He denies increased warmth or erythema to the foot. He denies fever/chills, purulent drainage or nausea/vomiting. 12/13/2020: Patient presents for 1 week follow-up. He was evaluated last week for his left great toe ulcer and started on Hydrofera Blue. I sent him for ABIs with TBI's and he had this done. He also started using a surgical shoe to help with pressure relief on the great toe. He has tolerated this well. He states he  saw his podiatrist yesterday for follow-up. They Have referred him to see Anthony Case. They are also planning to do a skin graft to the wound that if it has not resolved in the next month. Overall patient states he feels well and has no complaints today. 5/5; patient presents for 1 week follow-up. He reports no issues today. He has been using Hydrofera Blue. He states he saw Anthony Case and had a Imaging of his lower extremities. He states he feels well overall. He is going on a trip to [place] in 1-2 weeks. 5/17; patient presents for follow-up. Its been almost 2 weeks since he was last seen. He has been using Hydrofera Blue every other day. He overall feels well and denies signs of infection. He is leaving for a month-long trip to [place] tomorrow. 6/21; patient presents for 1 month follow-up. He recently went on a 1 month trip to [place]. He had a relaxing and enjoyable time. He has been using Hydrofera Blue daily on the wound. He recently ran out of supplies and has been keeping it covered with a Band-Aid. He denies signs of infection and has no issues or complaints today. 7/5; patient presents for 2-week follow-up. He has been using Hydrofera Blue to the wound bed. He is wearing closed toed shoes and states he is not using his sandals anymore. He reports walking barefoot but keeps the wound area covered. He denies signs of infection. 7/29; patient presents for 2-week follow-up. He has been using Hydrofera Blue to the wound bed. He has been on his feet more lately due to work. But he reports continued improvement to the wound area. He denies signs of infection. 8/18; patient presents for 2-week follow-up. He has been using Hydrofera Blue to the wound bed however he states this is scabbed over with no drainage and no issues. He denies signs of infection. Patient History Information obtained from Patient. Family History Heart Disease, No family history of Cancer, Diabetes, Hereditary Spherocytosis,  Hypertension, Kidney Disease, Lung Disease, Seizures, Stroke, Thyroid Problems, Tuberculosis. Social History Never  smoker, Marital Status - Married, Alcohol Use - Rarely - prior history, Drug Use - No History, Caffeine Use - Rarely. Medical History Eyes Denies history of Cataracts, Glaucoma, Optic Neuritis Ear/Nose/Mouth/Throat Denies history of Chronic sinus problems/congestion, Middle ear problems Hematologic/Lymphatic Denies history of Anemia, Hemophilia, Human Immunodeficiency Virus, Lymphedema, Sickle Cell Disease Respiratory Denies history of Aspiration, Asthma, Chronic Obstructive Pulmonary Disease (COPD), Pneumothorax, Sleep Apnea, Tuberculosis Cardiovascular Patient has history of Deep Vein Thrombosis - hx DVT Hypertension , Denies history of Angina, Arrhythmia, Congestive Heart Failure, Coronary Artery Disease, Hypotension, Myocardial Infarction, Peripheral Arterial Disease, Peripheral Venous Disease, Phlebitis, Vasculitis Gastrointestinal Denies history of Cirrhosis , Colitis, Crohnoos, Hepatitis A, Hepatitis B, Hepatitis C Endocrine Patient has history of Type II Diabetes Denies history of Type I Diabetes Genitourinary Denies history of End Stage Renal Disease Immunological Denies history of Lupus Erythematosus, Raynaudoos, Scleroderma Integumentary (Skin) Denies history of History of Burn Musculoskeletal Patient has history of Rheumatoid Arthritis Denies history of Gout, Osteoarthritis, Osteomyelitis Neurologic Patient has history of Neuropathy Denies history of Dementia, Quadriplegia, Paraplegia, Seizure Disorder Oncologic Denies history of Received Chemotherapy, Received Radiation Hospitalization/Surgery History - spine surgery. Medical A Surgical History Notes nd Eyes DM retinopathy Objective Constitutional respirations regular, non-labored and within target range for patient.. Vitals Time Taken: 8:03 AM, Height: 71 in, Weight: 255 lbs, BMI: 35.6,  Temperature: 98.2 F, Pulse: 75 bpm, Respiratory Rate: 18 breaths/min, Blood Pressure: 183/76 mmHg. Cardiovascular 2+ dorsalis pedis/posterior tibialis pulses. Psychiatric pleasant and cooperative. General Notes: Left foot: Distal end of the great toe with scab. No drainage noted on palpation. No signs of infection. Integumentary (Hair, Skin) Wound #1 status is Open. Original cause of wound was Trauma. The date acquired was: 08/10/2020. The wound has been in treatment 17 weeks. The wound is located on the [employer]. The wound measures 0.1cm length x 0.1cm width x 0.1cm depth; 0.008cm^2 area and 0.001cm^3 volume. There is Fat oe Layer (Subcutaneous Tissue) exposed. There is no tunneling or undermining noted. There is a medium amount of serosanguineous drainage noted. The wound margin is well defined and not attached to the wound base. There is large (67-100%) pink, pale granulation within the wound bed. There is no necrotic tissue within the wound bed. Assessment Active Problems ICD-10 Type 2 diabetes mellitus with foot ulcer Type 2 diabetes mellitus with diabetic neuropathy, unspecified Personal history of other venous thrombosis and embolism Essential (primary) hypertension Patient's wound has a scab over it that is dried. When palpating there is no drainage or fluctuance noted. I think at this time it would be best to just wait and watch and not debride. He has specific coverings for this toe to help with friction and protect it when he is wearing shoes. I recommended some Vaseline to the area to help soften it up. I will see him back in 1 month Plan Follow-up Appointments: Return appointment in 1 month. - with Dr. Heber Bessie Bathing/ Shower/ Hygiene: May shower and wash wound with soap and water. - with dressing changes only. Edema Control - Lymphedema / SCD / Other: Elevate legs to the level of the heart or above for 30 minutes daily and/or when sitting, a frequency of: -  throughout the day. Avoid standing for long periods of time. Moisturize legs daily. - both legs every night before bed. WOUND #1: - T Great Wound Laterality: Plantar, Left oe Cleanser: Soap and Water 1 x Per Day/15 Days Discharge Instructions: May shower and wash wound with dial antibacterial soap and water  prior to dressing change. Cleanser: Wound Cleanser (Generic) 1 x Per Day/15 Days Discharge Instructions: Cleanse the wound with wound cleanser prior to applying a clean dressing using gauze sponges, not tissue or cotton balls. Topical: Antibiotic Ointment 1 x Per [id number] Days Secondary Dressing: T Cover (patient has) 1 x Per Day/15 Days oe 1. Keep area protected 2. Can use Vaseline at night 3. Follow-up in 1 month Electronic Signature(s) Signed: 04/11/2021 8:42:43 AM By: Anthony Shan DO Entered By: Anthony Case on 04/11/2021 08:41:54 -------------------------------------------------------------------------------- HxROS Details Patient Name: Date of Service: [place] Anthony Case, [name] 04/11/2021 8:00 A M Medical Record Number: [id number] Patient Account Number: [id number] Date of Birth/Sex: Treating RN: Apr 15, 1951 (70 y.o. Anthony Case, Anthony Case Primary Care Provider: Leanna Case Other Clinician: Referring Provider: Treating Provider/Extender: Anthony Case in Treatment: 17 Information Obtained From Patient Eyes Medical History: Negative for: Cataracts; Glaucoma; Optic Neuritis Past Medical History Notes: DM retinopathy Ear/Nose/Mouth/Throat Medical History: Negative for: Chronic sinus problems/congestion; Middle ear problems Hematologic/Lymphatic Medical History: Negative for: Anemia; Hemophilia; Human Immunodeficiency Virus; Lymphedema; Sickle Cell Disease Respiratory Medical History: Negative for: Aspiration; Asthma; Chronic Obstructive Pulmonary Disease (COPD); Pneumothorax; Sleep Apnea; Tuberculosis Cardiovascular Medical  History: Positive for: Deep Vein Thrombosis - hx DVT Hypertension ; Negative for: Angina; Arrhythmia; Congestive Heart Failure; Coronary Artery Disease; Hypotension; Myocardial Infarction; Peripheral Arterial Disease; Peripheral Venous Disease; Phlebitis; Vasculitis Gastrointestinal Medical History: Negative for: Cirrhosis ; Colitis; Crohns; Hepatitis A; Hepatitis B; Hepatitis C Endocrine Medical History: Positive for: Type II Diabetes Negative for: Type I Diabetes Treated with: Insulin, Oral agents Blood sugar tested every day: No Genitourinary Medical History: Negative for: End Stage Renal Disease Immunological Medical History: Negative for: Lupus Erythematosus; Raynauds; Scleroderma Integumentary (Skin) Medical History: Negative for: History of Burn Musculoskeletal Medical History: Positive for: Rheumatoid Arthritis Negative for: Gout; Osteoarthritis; Osteomyelitis Neurologic Medical History: Positive for: Neuropathy Negative for: Dementia; Quadriplegia; Paraplegia; Seizure Disorder Oncologic Medical History: Negative for: Received Chemotherapy; Received Radiation Immunizations Pneumococcal Vaccine: Received Pneumococcal Vaccination: No Implantable Devices None Hospitalization / Surgery History Type of Hospitalization/Surgery spine surgery Family and Social History Cancer: No; Diabetes: No; Heart Disease: Yes; Hereditary Spherocytosis: No; Hypertension: No; Kidney Disease: No; Lung Disease: No; Seizures: No; Stroke: No; Thyroid Problems: No; Tuberculosis: No; Never smoker; Marital Status - Married; Alcohol Use: Rarely - prior history; Drug Use: No History; Caffeine Use: Rarely; Financial Concerns: No; Food, Clothing or Shelter Needs: No; Support System Lacking: No; Transportation Concerns: No Electronic Signature(s) Signed: 04/11/2021 8:42:43 AM By: Anthony Shan DO Signed: 04/11/2021 5:10:54 PM By: Rhae Hammock RN Entered By: Anthony Case on 04/11/2021  08:36:44 -------------------------------------------------------------------------------- SuperBill Details Patient Name: Date of Service: [place] Anthony Case, [name] 04/11/2021 Medical Record Number: [id number] Patient Account Number: [id number] Date of Birth/Sex: Treating RN: 05/28/1951 (70 y.o. Anthony Case Primary Care Provider: Leanna Case Other Clinician: Referring Provider: Treating Provider/Extender: Anthony Case in Treatment: 17 Diagnosis Coding ICD-10 Codes Code Description E11.621 Type 2 diabetes mellitus with foot ulcer E11.40 Type 2 diabetes mellitus with diabetic neuropathy, unspecified Z86.718 Personal history of other venous thrombosis and embolism I10 Essential (primary) hypertension Facility Procedures CPT4 Code: [id number] Description: 99213 - WOUND CARE VISIT-LEV 3 EST PT Modifier: Quantity: 1 Physician Procedures : CPT4 Code Description Modifier [id number] - WC PHYS LEVEL 3 - EST PT ICD-10 Diagnosis Description E11.621 Type 2 diabetes mellitus with foot ulcer E11.40 Type 2 diabetes mellitus with diabetic neuropathy, unspecified Quantity: 1 Electronic Signature(s) Signed: 04/11/2021 8:42:43 AM  By: Anthony Shan DO Entered By: Anthony Case on 04/11/2021 08:42:08

## 2021-04-11 NOTE — Progress Notes (Signed)
Anthony Case, Anthony Case ([id number]) Visit Report for 04/11/2021 Arrival Information Details Patient Name: Date of Service: Anthony Case, Anthony [place] LD B. 04/11/2021 8:00 A M Medical Record Number: [id number] Patient Account Number: [id number] Date of Birth/Sex: Treating RN: [birth year] (70 y.o. [name] Primary Care Annaston Upham: Leanna Battles Other Clinician: Referring Letticia Bhattacharyya: Treating Dary Dilauro/Extender: Darrold Junker in Treatment: 55 Visit Information History Since Last Visit Added or deleted any medications: No Patient Arrived: Ambulatory Any new allergies or adverse reactions: No Arrival Time: 08:03 Had a fall or experienced change in No Transfer Assistance: None activities of daily living that may affect Patient Identification Verified: Yes risk of falls: Secondary Verification Process Completed: Yes Signs or symptoms of abuse/neglect since last visito No Patient Requires Transmission-Based Precautions: No Hospitalized since last visit: No Patient Has Alerts: No Implantable device outside of the clinic excluding No cellular tissue based products placed in the center since last visit: Pain Present Now: No Electronic Signature(s) Signed: 04/11/2021 5:51:06 PM By: Lorrin Jackson Entered By: Lorrin Jackson on 04/11/2021 08:03:49 -------------------------------------------------------------------------------- Clinic Level of Care Assessment Details Patient Name: Date of Service: Anthony Case, Anthony NA LD B. 04/11/2021 8:00 A M Medical Record Number: [id number] Patient Account Number: [id number] Date of Birth/Sex: Treating RN: 07/14/1951 (70 y.o. [name] Primary Care Lujuana Kapler: Leanna Battles Other Clinician: Referring Tressa Maldonado: Treating Mcclain Shall/Extender: Darrold Junker in Treatment: 17 Clinic Level of Care Assessment Items TOOL 4 Quantity Score X- 1 0 Use when only an EandM is performed on FOLLOW-UP visit ASSESSMENTS  - Nursing Assessment / Reassessment X- 1 10 Reassessment of Co-morbidities (includes updates in patient status) X- 1 5 Reassessment of Adherence to Treatment Plan ASSESSMENTS - Wound and Skin A ssessment / Reassessment X - Simple Wound Assessment / Reassessment - one wound 1 5 '[]'  - 0 Complex Wound Assessment / Reassessment - multiple wounds '[]'  - 0 Dermatologic / Skin Assessment (not related to wound area) ASSESSMENTS - Focused Assessment '[]'  - 0 Circumferential Edema Measurements - multi extremities '[]'  - 0 Nutritional Assessment / Counseling / Intervention '[]'  - 0 Lower Extremity Assessment (monofilament, tuning fork, pulses) '[]'  - 0 Peripheral Arterial Disease Assessment (using hand held doppler) ASSESSMENTS - Ostomy and/or Continence Assessment and Care '[]'  - 0 Incontinence Assessment and Management '[]'  - 0 Ostomy Care Assessment and Management (repouching, etc.) PROCESS - Coordination of Care '[]'  - 0 Simple Patient / Family Education for ongoing care X- 1 20 Complex (extensive) Patient / Family Education for ongoing care '[]'  - 0 Staff obtains Programmer, systems, Records, T Results / Process Orders est '[]'  - 0 Staff telephones HHA, Nursing Homes / Clarify orders / etc '[]'  - 0 Routine Transfer to another Facility (non-emergent condition) '[]'  - 0 Routine Hospital Admission (non-emergent condition) '[]'  - 0 New Admissions / Biomedical engineer / Ordering NPWT Apligraf, etc. , '[]'  - 0 Emergency Hospital Admission (emergent condition) X- 1 10 Simple Discharge Coordination '[]'  - 0 Complex (extensive) Discharge Coordination PROCESS - Special Needs '[]'  - 0 Pediatric / Minor Patient Management '[]'  - 0 Isolation Patient Management '[]'  - 0 Hearing / Language / Visual special needs '[]'  - 0 Assessment of Community assistance (transportation, D/C planning, etc.) '[]'  - 0 Additional assistance / Altered mentation '[]'  - 0 Support Surface(s) Assessment (bed, cushion, seat, etc.) INTERVENTIONS -  Wound Cleansing / Measurement X - Simple Wound Cleansing - one wound 1 5 '[]'  - 0 Complex Wound Cleansing - multiple wounds '[]'  - 0 Wound Imaging (photographs -  any number of wounds) X- 1 5 Wound Tracing (instead of photographs) X- 1 5 Simple Wound Measurement - one wound '[]'  - 0 Complex Wound Measurement - multiple wounds INTERVENTIONS - Wound Dressings X - Small Wound Dressing one or multiple wounds 1 10 '[]'  - 0 Medium Wound Dressing one or multiple wounds '[]'  - 0 Large Wound Dressing one or multiple wounds '[]'  - 0 Application of Medications - topical '[]'  - 0 Application of Medications - injection INTERVENTIONS - Miscellaneous '[]'  - 0 External ear exam '[]'  - 0 Specimen Collection (cultures, biopsies, blood, body fluids, etc.) '[]'  - 0 Specimen(s) / Culture(s) sent or taken to Lab for analysis '[]'  - 0 Patient Transfer (multiple staff / Civil Service fast streamer / Similar devices) '[]'  - 0 Simple Staple / Suture removal (25 or less) '[]'  - 0 Complex Staple / Suture removal (26 or more) '[]'  - 0 Hypo / Hyperglycemic Management (close monitor of Blood Glucose) '[]'  - 0 Ankle / Brachial Index (ABI) - do not check if billed separately X- 1 5 Vital Signs Has the patient been seen at the hospital within the last three years: Yes Total Score: 80 Level Of Care: New/Established - Level 3 Electronic Signature(s) Signed: 04/11/2021 5:51:06 PM By: Lorrin Jackson Entered By: Lorrin Jackson on 04/11/2021 08:22:17 -------------------------------------------------------------------------------- Encounter Discharge Information Details Patient Name: Date of Service: Anthony Case, Anthony NA LD B. 04/11/2021 8:00 A M Medical Record Number: [id number] Patient Account Number: [id number] Date of Birth/Sex: Treating RN: [birth year] (70 y.o. [name] Primary Care Tou Hayner: Leanna Battles Other Clinician: Referring Dyrell Tuccillo: Treating Samika Vetsch/Extender: Darrold Junker in Treatment: 17 Encounter  Discharge Information Items Discharge Condition: Stable Ambulatory Status: Ambulatory Discharge Destination: Home Transportation: Private Auto Schedule Follow-up Appointment: Yes Clinical Summary of Care: Provided on 04/11/2021 Form Type Recipient Paper Patient Patient Electronic Signature(s) Signed: 04/11/2021 5:51:06 PM By: Lorrin Jackson Entered By: Lorrin Jackson on 04/11/2021 08:25:49 -------------------------------------------------------------------------------- Lower Extremity Assessment Details Patient Name: Date of Service: Anthony Case, Anthony NA LD B. 04/11/2021 8:00 A M Medical Record Number: [id number] Patient Account Number: [id number] Date of Birth/Sex: Treating RN: 29-May-1951 (70 y.o. [name] Primary Care Kirt Chew: Leanna Battles Other Clinician: Referring Sherlyne Crownover: Treating Madge Therrien/Extender: Darrold Junker in Treatment: 17 Edema Assessment Assessed: Shirlyn Goltz: Yes] Patrice Paradise: No] Edema: [Left: N] [Right: o] Calf Left: Right: Point of Measurement: 34 cm From Medial Instep 39 cm Ankle Left: Right: Point of Measurement: 12 cm From Medial Instep 23 cm Vascular Assessment Pulses: Dorsalis Pedis Palpable: [Left:Yes] Electronic Signature(s) Signed: 04/11/2021 5:51:06 PM By: Lorrin Jackson Entered By: Lorrin Jackson on 04/11/2021 08:04:39 -------------------------------------------------------------------------------- Multi Wound Chart Details Patient Name: Date of Service: Anthony Case, Anthony NA LD B. 04/11/2021 8:00 A M Medical Record Number: [id number] Patient Account Number: [id number] Date of Birth/Sex: Treating RN: [birth year] (70 y.o. [name], Lauren Primary Care Alexandros Ewan: Leanna Battles Other Clinician: Referring Alesana Magistro: Treating Sheina Mcleish/Extender: Darrold Junker in Treatment: 17 Vital Signs Height(in): 29 Pulse(bpm): 18 Weight(lbs): 255 Blood Pressure(mmHg): 183/76 Body Mass Index(BMI):  36 Temperature(F): 98.2 Respiratory Rate(breaths/min): 18 Photos: [N/A:N/A] Left, Plantar T Great oe N/A N/A Wound Location: Trauma N/A N/A Wounding Event: Diabetic Wound/Ulcer of the Lower N/A N/A Primary Etiology: Extremity Deep Vein Thrombosis, Hypertension, N/A N/A Comorbid History: Type II Diabetes, Rheumatoid Arthritis, Neuropathy 08/10/2020 N/A N/A Date Acquired: 17 N/A N/A Weeks of Treatment: Open N/A N/A Wound Status: 0.1x0.1x0.1 N/A N/A Measurements L x W x D (cm) 0.008 N/A N/A A (cm) : rea  0.001 N/A N/A Volume (cm) : 99.20% N/A N/A % Reduction in A rea: 98.90% N/A N/A % Reduction in Volume: Grade 2 N/A N/A Classification: Medium N/A N/A Exudate A mount: Serosanguineous N/A N/A Exudate Type: red, brown N/A N/A Exudate Color: Well defined, not attached N/A N/A Wound Margin: Large (67-100%) N/A N/A Granulation A mount: Pink, Pale N/A N/A Granulation Quality: None Present (0%) N/A N/A Necrotic A mount: Fat Layer (Subcutaneous Tissue): Yes N/A N/A Exposed Structures: Fascia: No Tendon: No Muscle: No Joint: No Bone: No Large (67-100%) N/A N/A Epithelialization: Treatment Notes Wound #1 (Toe Great) Wound Laterality: Plantar, Left Cleanser Soap and Water Discharge Instruction: May shower and wash wound with dial antibacterial soap and water prior to dressing change. Wound Cleanser Discharge Instruction: Cleanse the wound with wound cleanser prior to applying a clean dressing using gauze sponges, not tissue or cotton balls. Peri-Wound Care Topical Antibiotic Ointment Primary Dressing Secondary Dressing T Cover (patient has) oe Secured With Compression Wrap Compression Stockings Add-Ons Electronic Signature(s) Signed: 04/11/2021 8:42:43 AM By: Kalman Shan DO Signed: 04/11/2021 5:10:54 PM By: Rhae Hammock RN Entered By: Kalman Shan on 04/11/2021  08:35:48 -------------------------------------------------------------------------------- Multi-Disciplinary Care Plan Details Patient Name: Date of Service: Anthony Case, Anthony NA LD B. 04/11/2021 8:00 A M Medical Record Number: [id number] Patient Account Number: [id number] Date of Birth/Sex: Treating RN: 1951/05/11 (70 y.o. [name] Primary Care Hever Castilleja: Leanna Battles Other Clinician: Referring Brisha Mccabe: Treating Kally Cadden/Extender: Darrold Junker in Treatment: [place] reviewed with physician Active Inactive Nutrition Nursing Diagnoses: Impaired glucose control: actual or potential Goals: Patient/caregiver verbalizes understanding of need to maintain therapeutic glucose control per primary care physician Date Initiated: 12/07/2020 Target Resolution Date: 04/19/2021 Goal Status: Active Interventions: Assess HgA1c results as ordered upon admission and as needed Assess patient nutrition upon admission and as needed per policy Provide education on elevated blood sugars and impact on wound healing Treatment Activities: Obtain HgA1c : 12/07/2020 Notes: Wound/Skin Impairment Nursing Diagnoses: Impaired tissue integrity Goals: Patient/caregiver will verbalize understanding of skin care regimen Date Initiated: 12/07/2020 Target Resolution Date: 04/19/2021 Goal Status: Active Ulcer/skin breakdown will have a volume reduction of 30% by week 4 Date Initiated: 12/07/2020 Date Inactivated: 03/22/2021 Target Resolution Date: 03/19/2021 Goal Status: Met Ulcer/skin breakdown will have a volume reduction of 50% by week 8 Date Initiated: 03/22/2021 Target Resolution Date: 04/19/2021 Goal Status: Active Interventions: Assess patient/caregiver ability to obtain necessary supplies Assess patient/caregiver ability to perform ulcer/skin care regimen upon admission and as needed Assess ulceration(s) every visit Provide education on ulcer and  skin care Treatment Activities: Skin care regimen initiated : 12/07/2020 Topical wound management initiated : 12/07/2020 Notes: Electronic Signature(s) Signed: 04/11/2021 5:51:06 PM By: Lorrin Jackson Entered By: Lorrin Jackson on 04/11/2021 08:08:04 -------------------------------------------------------------------------------- Pain Assessment Details Patient Name: Date of Service: Anthony Case, Anthony NA LD B. 04/11/2021 8:00 A M Medical Record Number: [id number] Patient Account Number: [id number] Date of Birth/Sex: Treating RN: 1951-07-25 (70 y.o. [name] Primary Care Deisy Ozbun: Leanna Battles Other Clinician: Referring Bader Stubblefield: Treating Ashland Osmer/Extender: Darrold Junker in Treatment: 17 Active Problems Location of Pain Severity and Description of Pain Patient Has Paino No Site Locations Pain Management and Medication Current Pain Management: Electronic Signature(s) Signed: 04/11/2021 5:51:06 PM By: Lorrin Jackson Entered By: Lorrin Jackson on 04/11/2021 08:04:18 -------------------------------------------------------------------------------- Patient/Caregiver Education Details Patient Name: Date of Service: Anthony Case, Anthony NA LD B. 8/18/2022andnbsp8:00 A M Medical Record Number: [id number] Patient Account Number: [id number] Date of Birth/Gender: Treating  RN: 1951-07-03 (70 y.o. [name] Primary Care Physician: Leanna Battles Other Clinician: Referring Physician: Treating Physician/Extender: Darrold Junker in Treatment: 17 Education Assessment Education Provided To: Patient Education Topics Provided Wound/Skin Impairment: Methods: Explain/Verbal, Printed Responses: State content correctly [employer]) Signed: 04/11/2021 5:51:06 PM By: Lorrin Jackson Entered By: Lorrin Jackson on 04/11/2021 08:08:26 -------------------------------------------------------------------------------- Wound  Assessment Details Patient Name: Date of Service: Anthony Case, Anthony NA LD B. 04/11/2021 8:00 A M Medical Record Number: [id number] Patient Account Number: [id number] Date of Birth/Sex: Treating RN: 15-Jan-1951 (70 y.o. [name] Primary Care Navreet Bolda: Leanna Battles Other Clinician: Referring Tianna Baus: Treating Denetria Luevanos/Extender: Darrold Junker in Treatment: 17 Wound Status Wound Number: 1 Primary Diabetic Wound/Ulcer of the Lower Extremity Etiology: Wound Location: Left, Plantar T Great oe Wound Open Wounding Event: Trauma Status: Date Acquired: 08/10/2020 Comorbid Deep Vein Thrombosis, Hypertension, Type II Diabetes, Weeks Of Treatment: 17 History: Rheumatoid Arthritis, Neuropathy Clustered Wound: No Photos Wound Measurements Length: (cm) 0.1 Width: (cm) 0.1 Depth: (cm) 0.1 Area: (cm) 0.008 Volume: (cm) 0.001 % Reduction in Area: 99.2% % Reduction in Volume: 98.9% Epithelialization: Large (67-100%) Tunneling: No Undermining: No Wound Description Classification: Grade 2 Wound Margin: Well defined, not attached Exudate Amount: Medium Exudate Type: Serosanguineous Exudate Color: red, brown Foul Odor After Cleansing: No Slough/Fibrino Yes Wound Bed Granulation Amount: Large (67-100%) Exposed Structure Granulation Quality: Pink, Pale Fascia Exposed: No Necrotic Amount: None Present (0%) Fat Layer (Subcutaneous Tissue) Exposed: Yes Tendon Exposed: No Muscle Exposed: No Joint Exposed: No Bone Exposed: No Treatment Notes Wound #1 (Toe Great) Wound Laterality: Plantar, Left Cleanser Soap and Water Discharge Instruction: May shower and wash wound with dial antibacterial soap and water prior to dressing change. Wound Cleanser Discharge Instruction: Cleanse the wound with wound cleanser prior to applying a clean dressing using gauze sponges, not tissue or cotton balls. Peri-Wound Care Topical Antibiotic Ointment Primary  Dressing Secondary Dressing T Cover (patient has) oe Secured With Compression Wrap Compression Stockings Add-Ons Electronic Signature(s) Signed: 04/11/2021 5:51:06 PM By: Lorrin Jackson Entered By: Lorrin Jackson on 04/11/2021 08:09:52 -------------------------------------------------------------------------------- Vitals Details Patient Name: Date of Service: Anthony Case, Anthony NA LD B. 04/11/2021 8:00 A M Medical Record Number: [id number] Patient Account Number: [id number] Date of Birth/Sex: Treating RN: 30-Sep-1950 (70 y.o. [name] Primary Care Albin Duckett: Leanna Battles Other Clinician: Referring Amee Boothe: Treating Electa Sterry/Extender: Darrold Junker in Treatment: 17 Vital Signs Time Taken: 08:03 Temperature (F): 98.2 Height (in): 71 Pulse (bpm): 75 Weight (lbs): 255 Respiratory Rate (breaths/min): 18 Body Mass Index (BMI): 35.6 Blood Pressure (mmHg): 183/76 Reference Range: 80 - 120 mg / dl Electronic Signature(s) Signed: 04/11/2021 5:51:06 PM By: Lorrin Jackson Entered By: Lorrin Jackson on 04/11/2021 08:04:10

## 2021-04-25 ENCOUNTER — Encounter (HOSPITAL_COMMUNITY): Payer: Self-pay | Admitting: *Deleted

## 2021-04-25 ENCOUNTER — Emergency Department (HOSPITAL_COMMUNITY)
Admission: EM | Admit: 2021-04-25 | Discharge: 2021-04-25 | Disposition: A | Discharge status: Home / Self Care | Payer: Medicare Other | Attending: Emergency Medicine | Admitting: Emergency Medicine

## 2021-04-25 ENCOUNTER — Emergency Department (HOSPITAL_COMMUNITY): Payer: Medicare Other

## 2021-04-25 ENCOUNTER — Other Ambulatory Visit: Payer: Self-pay

## 2021-04-25 DIAGNOSIS — S199XXA Unspecified injury of neck, initial encounter: Secondary | ICD-10-CM | POA: Diagnosis not present

## 2021-04-25 DIAGNOSIS — Y92008 Other place in unspecified non-institutional (private) residence as the place of occurrence of the external cause: Secondary | ICD-10-CM | POA: Insufficient documentation

## 2021-04-25 DIAGNOSIS — Z7901 Long term (current) use of anticoagulants: Secondary | ICD-10-CM | POA: Diagnosis not present

## 2021-04-25 DIAGNOSIS — M47814 Spondylosis without myelopathy or radiculopathy, thoracic region: Secondary | ICD-10-CM | POA: Diagnosis not present

## 2021-04-25 DIAGNOSIS — Z79899 Other long term (current) drug therapy: Secondary | ICD-10-CM | POA: Diagnosis not present

## 2021-04-25 DIAGNOSIS — S0990XA Unspecified injury of head, initial encounter: Secondary | ICD-10-CM | POA: Insufficient documentation

## 2021-04-25 DIAGNOSIS — Z7984 Long term (current) use of oral hypoglycemic drugs: Secondary | ICD-10-CM | POA: Diagnosis not present

## 2021-04-25 DIAGNOSIS — I6523 Occlusion and stenosis of bilateral carotid arteries: Secondary | ICD-10-CM | POA: Diagnosis not present

## 2021-04-25 DIAGNOSIS — I10 Essential (primary) hypertension: Secondary | ICD-10-CM | POA: Insufficient documentation

## 2021-04-25 DIAGNOSIS — S299XXA Unspecified injury of thorax, initial encounter: Secondary | ICD-10-CM | POA: Diagnosis present

## 2021-04-25 DIAGNOSIS — E1140 Type 2 diabetes mellitus with diabetic neuropathy, unspecified: Secondary | ICD-10-CM | POA: Insufficient documentation

## 2021-04-25 DIAGNOSIS — S2241XA Multiple fractures of ribs, right side, initial encounter for closed fracture: Secondary | ICD-10-CM | POA: Diagnosis not present

## 2021-04-25 DIAGNOSIS — G319 Degenerative disease of nervous system, unspecified: Secondary | ICD-10-CM | POA: Diagnosis not present

## 2021-04-25 DIAGNOSIS — M2578 Osteophyte, vertebrae: Secondary | ICD-10-CM | POA: Diagnosis not present

## 2021-04-25 DIAGNOSIS — S22089A Unspecified fracture of T11-T12 vertebra, initial encounter for closed fracture: Secondary | ICD-10-CM | POA: Insufficient documentation

## 2021-04-25 DIAGNOSIS — S22079A Unspecified fracture of T9-T10 vertebra, initial encounter for closed fracture: Secondary | ICD-10-CM | POA: Diagnosis not present

## 2021-04-25 DIAGNOSIS — S2232XA Fracture of one rib, left side, initial encounter for closed fracture: Secondary | ICD-10-CM | POA: Diagnosis not present

## 2021-04-25 DIAGNOSIS — W108XXA Fall (on) (from) other stairs and steps, initial encounter: Secondary | ICD-10-CM | POA: Insufficient documentation

## 2021-04-25 DIAGNOSIS — D689 Coagulation defect, unspecified: Secondary | ICD-10-CM | POA: Diagnosis not present

## 2021-04-25 DIAGNOSIS — Z981 Arthrodesis status: Secondary | ICD-10-CM | POA: Diagnosis not present

## 2021-04-25 NOTE — ED Triage Notes (Signed)
Pt has hx of back surgery in 2014. Had a fall on Monday and return of mid back pain. Ambulatory at triage.

## 2021-04-25 NOTE — Discharge Instructions (Addendum)
You were diagnosed with a rib fracture and a T11 thoracic fracture on your CT scans today.  We placed you in a brace which you should wear until you see the neurosurgeon in the office.  This will help relieve some pressure on your spine.  I also provided you with an incentive spirometer, and recommend that you use this 10 times a day, 10 breaths at a time, for the next 2 weeks.  This to help prevent you from developing pneumonia, or lung infection, due to taking shallow breaths.  Please return to the emergency department immediately if you begin to develop numbness or weakness in your legs, difficulty urinating or loss of control of your bowel or bladders, or cough, shortness of breath with fevers.  These may be signs of damage to the spinal cord or nerves in your spine, or  signs of developing pneumonia.

## 2021-04-25 NOTE — ED Notes (Signed)
Called ortho tech for TLSO brace

## 2021-04-25 NOTE — Progress Notes (Signed)
Orthopedic Tech Progress Note Patient Details:  Anthony Case Jan 05, 1951 [id number] Called order into [facility] Patient ID: Anthony Case, male   DOB: [birth year], 70 y.o.   MRN: [id number]  Anthony Case 04/25/2021, 11:23 AM

## 2021-04-25 NOTE — ED Provider Notes (Signed)
Emergency Medicine Provider Triage Evaluation Note  MAZE SHARAF , a 70 y.o. male  was evaluated in triage.  Pt complains of fall. Mechanically fall 2 days ago. Fells onto back. Prior fusion to thoracic region. Having moderate pain. No incontinence, saddle anesthesia, numbness, weakness. PCP told to come here for CT. Hit head. On anticoagulation.  Review of Systems  Positive: Back pain Negative: Numbness, weakness, incontinence  Physical Exam  BP 139/86 (BP Location: Left Arm)   Pulse 78   Temp 97.8 F (36.6 C) (Oral)   Resp 18   SpO2 95%  Gen:   Awake, no distress   Resp:  Normal effort  MSK:   Moves extremities without difficulty, old surgical scars, Tenderness to midline thoracic region Other:    Medical Decision Making  Medically screening exam initiated at 9:30 AM.  Appropriate orders placed.  [name] was informed that the remainder of the evaluation will be completed by another provider, this initial triage assessment does not replace that evaluation, and the importance of remaining in the ED until their evaluation is complete.  Back pain, falls, fall on anticoagulation   Abie Killian A, PA-C 04/25/21 0933    Carmin Muskrat, MD 04/25/21 1704

## 2021-04-25 NOTE — ED Provider Notes (Signed)
[facility] EMERGENCY DEPARTMENT Provider Note   CSN: [id number] Arrival date & time: 04/25/21  0917     History Chief Complaint  Patient presents with   Back Pain    Anthony Case is a 70 y.o. male w/ hx f T11 fracture, A.S, spinal fusion as noted below in 2014 (Dr Roselee Culver).  Patient reports that he had a mechanical fall 3 days ago and fell backwards down 3 steps in his front porch, landing directly on his back.  He had immediate pain in his mid back.  He was not able to move much the past 2 days, today was able to gingerly get out of bed.  Has pain with any movement.  He denies numbness or weakness in his arms or legs.  He reports lightly striking the back of his head but no loss of consciousness.  He was concerned that he may have injured his back again.  Dr Roselee Culver his neurosurgeon, per medical records T spine operation in 2014 - Preoperative diagnosis: T11 Chance fracture, ankylosing spondylitis Postoperative diagnosis: T11 Chance fracture, ankylosing spondylitis Procedure: Posterior fixation of T11 Chance fracture with segmental fixation from T9-L1 using percutaneous ankle screw placement and fluoroscopic guidance  HPI     Past Medical History:  Diagnosis Date   Arthritis    Diabetes mellitus    Hx pulmonary embolism 2009   required emergent tPA treatment   Hypertension    Shortness of breath    Sleep apnea     Patient Active Problem List   Diagnosis Date Noted   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   Laceration of left great toe 08/03/2020   Pain due to onychomycosis of toenail of left foot 04/05/2019   Diabetic neuropathy ([place]) 04/05/2019   History of adenomatous polyp of colon 08/05/2013   Thoracic spine fracture ([place]) 05/23/2013   Pulmonary embolism ([place]) 04/09/2012    Class: Acute   Left leg DVT ([place]) 04/09/2012    Class: Acute   Dyspnea on exertion 04/07/2012    Class: Acute   Leg edema, left 04/07/2012    Class: Acute    DIABETES, TYPE 2 04/12/2008   HYPERLIPIDEMIA 04/12/2008   HYPERTENSION 04/12/2008   OSA (obstructive sleep apnea) 04/12/2008    Past Surgical History:  Procedure Laterality Date   BACK SURGERY     COLONOSCOPY  2009   adenoma polyp   ELBOW ARTHROSCOPY  2010   IR RADIOLOGIST EVAL & MGMT  12/25/2020   IR RADIOLOGIST EVAL & MGMT  12/28/2020   LUMBAR PERCUTANEOUS PEDICLE SCREW 3 LEVEL N/A 05/25/2013   Procedure: Posterior Percutaneous Fixation from Thoracic nine to Lumbar one vertebrae with arthrodesis of Thoracic eleven Fracture;  Surgeon: Kristeen Miss, MD;  Location: [facility];  Service: Neurosurgery;  Laterality: N/A;  Posterior Percutaneous Fixation from Thoracic nine to Lumbar one vertebrae with arthrodesis of Thoracic eleven Fracture       Family History  Problem Relation Age of Onset   Heart disease Brother    Schizophrenia Mother    Heart disease Father    Diabetes Father     Social History   Tobacco Use   Smoking status: Never   Smokeless tobacco: Never  Vaping Use   Vaping Use: Never used  Substance Use Topics   Alcohol use: Not Currently    Comment: Previously drinking 3-5 drinks nightly x 20 year   Drug use: No    Home Medications Prior to Admission medications  Medication Sig Start Date End Date Taking? Authorizing Provider  ACETAMINOPHEN PO Take by mouth. 2 tabs qd    [provider]  atorvastatin (LIPITOR) 20 MG tablet Take 20 mg by mouth daily.    [provider]  dapagliflozin propanediol (FARXIGA) 10 MG TABS tablet Take 10 mg by mouth daily.    [provider]  furosemide (LASIX) 20 MG tablet Take 20 mg by mouth as needed.    [provider]  glipiZIDE (GLUCOTROL XL) 2.5 MG 24 hr tablet 2 (two) times daily.  02/23/19   [provider]  HYDROcodone-acetaminophen (NORCO/VICODIN) 5-325 MG tablet SMARTSIG:1 Tablet(s) By Mouth Every 12 Hours PRN 05/03/19   [provider]  LEVEMIR FLEXTOUCH 100 UNIT/ML FlexPen  SMARTSIG:60 Unit(s) SUB-Q Daily 02/04/20   [provider]  losartan (COZAAR) 50 MG tablet Take 50 mg by mouth daily. 03/22/20   [provider]  metFORMIN (GLUCOPHAGE) 500 MG tablet Take 1,000 mg by mouth daily with breakfast.    [provider]  metoprolol succinate (TOPROL-XL) 25 MG 24 hr tablet Take 25 mg by mouth daily. 02/29/20   [provider]  NON FORMULARY Trimix injection    [provider]  Rivaroxaban (XARELTO) 20 MG TABS tablet Take 20 mg by mouth daily. 08/04/13 last dose of xarelto in preparation for colonoscopy 04/09/12   Leanna Battles, MD  Vitamin D, Ergocalciferol, (DRISDOL) 1.25 MG (50000 UT) CAPS capsule Take 50,000 Units by mouth once a week. 02/17/19   [provider]    Allergies    Lisinopril  Review of Systems   Review of Systems  Constitutional:  Negative for chills and fever.  Respiratory:  Negative for cough and shortness of breath.   Cardiovascular:  Negative for chest pain and palpitations.  Gastrointestinal:  Negative for abdominal pain and vomiting.  Musculoskeletal:  Positive for arthralgias, back pain and myalgias.  Skin:  Negative for color change and rash.  Neurological:  Negative for syncope, weakness, numbness and headaches.  All other systems reviewed and are negative.  Physical Exam Updated Vital Signs BP (!) 160/90   Pulse 77   Temp 97.9 F (36.6 C) (Oral)   Resp 16   SpO2 96%   Physical Exam Constitutional:      General: He is not in acute distress. HENT:     Head: Normocephalic and atraumatic.  Eyes:     Conjunctiva/sclera: Conjunctivae normal.     Pupils: Pupils are equal, round, and reactive to light.  Cardiovascular:     Rate and Rhythm: Normal rate and regular rhythm.  Pulmonary:     Effort: Pulmonary effort is normal. No respiratory distress.  Musculoskeletal:     Comments: Mid thoracic tenderness  Skin:    General: Skin is warm and dry.  Neurological:     General: No  focal deficit present.     Mental Status: He is alert and oriented to person, place, and time. Mental status is at baseline.     Sensory: No sensory deficit.     Motor: No weakness.  Psychiatric:        Mood and Affect: Mood normal.        Behavior: Behavior normal.    ED Results / Procedures / Treatments   Labs (all labs ordered are listed, but only abnormal results are displayed) Labs Reviewed - No data to display  EKG None  Radiology CT HEAD WO CONTRAST (5MM)  Result Date: 04/25/2021 CLINICAL DATA:  Fall 3 days  ago.  Head trauma, coagulopathy EXAM: CT HEAD WITHOUT CONTRAST CT CERVICAL SPINE WITHOUT CONTRAST TECHNIQUE: Multidetector CT imaging of the head and cervical spine was performed following the standard protocol without intravenous contrast. Multiplanar CT image reconstructions of the cervical spine were also generated. COMPARISON:  None. FINDINGS: CT HEAD FINDINGS Brain: No evidence of acute infarction, hemorrhage, hydrocephalus, extra-axial collection or mass lesion/mass effect. Mild frontal lobe atrophy. Vascular: Negative for hyperdense vessel Skull: Negative Sinuses/Orbits: Negative Other: None CT CERVICAL SPINE FINDINGS Alignment: Normal Skull base and vertebrae: Negative for fracture. Ankylosing spondylitis. Flowing anterior osteophytes. Bony fusion throughout the cervical spine. Soft tissues and spinal canal: Negative for soft tissue mass. Atherosclerotic calcification in the carotid artery bilaterally. Disc levels: Ankylosing spondylitis. In addition, there is disc degeneration and spurring throughout the cervical spine without significant stenosis. Upper chest: Lung apices clear bilaterally. Other: None IMPRESSION: 1. No acute intracranial abnormality 2. Ankylosing spondylitis.  Negative for cervical spine fracture. Electronically Signed   By: Franchot Gallo M.D.   On: 04/25/2021 10:43   CT Cervical Spine Wo Contrast  Result Date: 04/25/2021 CLINICAL DATA:  Fall 3 days ago.   Head trauma, coagulopathy EXAM: CT HEAD WITHOUT CONTRAST CT CERVICAL SPINE WITHOUT CONTRAST TECHNIQUE: Multidetector CT imaging of the head and cervical spine was performed following the standard protocol without intravenous contrast. Multiplanar CT image reconstructions of the cervical spine were also generated. COMPARISON:  None. FINDINGS: CT HEAD FINDINGS Brain: No evidence of acute infarction, hemorrhage, hydrocephalus, extra-axial collection or mass lesion/mass effect. Mild frontal lobe atrophy. Vascular: Negative for hyperdense vessel Skull: Negative Sinuses/Orbits: Negative Other: None CT CERVICAL SPINE FINDINGS Alignment: Normal Skull base and vertebrae: Negative for fracture. Ankylosing spondylitis. Flowing anterior osteophytes. Bony fusion throughout the cervical spine. Soft tissues and spinal canal: Negative for soft tissue mass. Atherosclerotic calcification in the carotid artery bilaterally. Disc levels: Ankylosing spondylitis. In addition, there is disc degeneration and spurring throughout the cervical spine without significant stenosis. Upper chest: Lung apices clear bilaterally. Other: None IMPRESSION: 1. No acute intracranial abnormality 2. Ankylosing spondylitis.  Negative for cervical spine fracture. Electronically Signed   By: Franchot Gallo M.D.   On: 04/25/2021 10:43   CT Thoracic Spine Wo Contrast  Result Date: 04/25/2021 CLINICAL DATA:  Fall 3 days ago. Back pain. History of spinal fusion 2014 EXAM: CT THORACIC SPINE WITHOUT CONTRAST TECHNIQUE: Multidetector CT images of the thoracic were obtained using the standard protocol without intravenous contrast. COMPARISON:  CT thoracic spine 05/23/2013.  CT chest 12/16/2017 FINDINGS: Alignment: Normal Vertebrae: Acute fracture inferior endplate of T9. Horizontal fracture just above the T9-10 disc space. Fracture extends into the proximal right tenth rib on the right. Fracture extends into the superior articulating facet T10 on the left.  Ankylosing spondylitis with anterior and posterior solid bony fusion throughout the thoracic spine which is chronic and unchanged from prior studies. Bilateral pedicle screw fusion in the lower thoracic spine. Bilateral pedicle screws and posterior rods at T9, T10, T12, L1. Previously noted fracture of T11 has healed since 2014 CT. Paraspinal and other soft tissues: No paraspinous mass or edema. Disc levels: No significant thoracic spinal stenosis. Ankylosing spondylosis with anterior and posterior bony fusion throughout the thoracic spine. IMPRESSION: Acute fracture inferior endplate of T9 without significant fracture displacement or spinal stenosis. Ankylosing spondylitis. Pedicle screw and rod fusion T9 through L1 for healed fracture at T11. Electronically Signed   By: Franchot Gallo M.D.   On: 04/25/2021 10:37    Procedures Procedures  Medications Ordered in ED Medications - No data to display  ED Course  I have reviewed the triage vital signs and the nursing notes.  Pertinent labs & imaging results that were available during my care of the patient were reviewed by me and considered in my medical decision making (see chart for details).  Patient is here with back pain after mechanical fall.  CT scan of the head and C-spine are unremarkable for traumatic injuries.  C-spine CT injury as noted below.  The patient is neurologically intact and has no deficits suggest central cord syndrome.  No hypoxia or signs or symptoms of flail chest, pneumonia, or respiratory difficulty from his rib fracture.  He overall appears well, is able to ambulate steadily.  I consulted neurosurgery by phone as noted below, and felt that it was reasonably safe to discharge the patient with a TLSO brace and close outpatient follow-up with his neurosurgeon.  The patient verbalized understanding of the plan  Clinical Course as of 04/25/21 1542  Thu Apr 25, 2021  1045 CT head and cervical spine reviewed and no acute traumatic  injuries. [MT]  1046 IMPRESSION: Acute fracture inferior endplate of T9 without significant fracture displacement or spinal stenosis.   Ankylosing spondylitis.   Pedicle screw and rod fusion T9 through L1 for healed fracture at T11 [MT]  1109 Page placed out through neurosurgery office to Dr Roselee Culver. [MT]  1126 I spoke to Dr Kathyrn Sheriff from neurosurgery who reviewed the CT films, and felt that if the patient was neurologically intact, he can be discharged in a TLSO brace with office follow-up with Dr Roselee Culver.  I informed the patient.  He is being fitted for a brace.  He verbalized understanding of the plan. [MT]    Clinical Course User Index [MT] Jaziyah Gradel, Carola Rhine, MD    Final Clinical Impression(s) / ED Diagnoses Final diagnoses:  Closed fracture of eleventh thoracic vertebra, unspecified fracture morphology, initial encounter [facility])  Closed fracture of one rib of left side, initial encounter    Rx / DC Orders ED Discharge Orders     None        Wyvonnia Dusky, MD 04/25/21 1542

## 2021-04-25 NOTE — ED Notes (Signed)
Ortho at bedside applying TLSO brace. 

## 2021-04-30 DIAGNOSIS — S22078A Other fracture of T9-T10 vertebra, initial encounter for closed fracture: Secondary | ICD-10-CM | POA: Diagnosis not present

## 2021-04-30 DIAGNOSIS — Z6835 Body mass index (BMI) 35.0-35.9, adult: Secondary | ICD-10-CM | POA: Diagnosis not present

## 2021-05-02 ENCOUNTER — Other Ambulatory Visit: Payer: Self-pay | Admitting: Neurological Surgery

## 2021-05-02 DIAGNOSIS — S22078A Other fracture of T9-T10 vertebra, initial encounter for closed fracture: Secondary | ICD-10-CM

## 2021-05-09 ENCOUNTER — Encounter (HOSPITAL_BASED_OUTPATIENT_CLINIC_OR_DEPARTMENT_OTHER): Payer: Medicare Other | Attending: Internal Medicine | Admitting: Internal Medicine

## 2021-05-09 ENCOUNTER — Other Ambulatory Visit: Payer: Self-pay

## 2021-05-09 DIAGNOSIS — E1142 Type 2 diabetes mellitus with diabetic polyneuropathy: Secondary | ICD-10-CM | POA: Insufficient documentation

## 2021-05-09 DIAGNOSIS — Z7901 Long term (current) use of anticoagulants: Secondary | ICD-10-CM | POA: Insufficient documentation

## 2021-05-09 DIAGNOSIS — E11621 Type 2 diabetes mellitus with foot ulcer: Secondary | ICD-10-CM | POA: Diagnosis not present

## 2021-05-09 DIAGNOSIS — Z86718 Personal history of other venous thrombosis and embolism: Secondary | ICD-10-CM

## 2021-05-09 DIAGNOSIS — E1151 Type 2 diabetes mellitus with diabetic peripheral angiopathy without gangrene: Secondary | ICD-10-CM | POA: Insufficient documentation

## 2021-05-09 DIAGNOSIS — Z794 Long term (current) use of insulin: Secondary | ICD-10-CM | POA: Insufficient documentation

## 2021-05-09 DIAGNOSIS — E1140 Type 2 diabetes mellitus with diabetic neuropathy, unspecified: Secondary | ICD-10-CM

## 2021-05-09 DIAGNOSIS — I10 Essential (primary) hypertension: Secondary | ICD-10-CM | POA: Diagnosis not present

## 2021-05-09 DIAGNOSIS — L97529 Non-pressure chronic ulcer of other part of left foot with unspecified severity: Secondary | ICD-10-CM | POA: Insufficient documentation

## 2021-05-09 NOTE — Progress Notes (Signed)
ED, ANNESS ([id number]) Visit Report for 05/09/2021 Chief Complaint Document Details Patient Name: Date of Service: [place], [name] 05/09/2021 8:00 A M Medical Record Number: [id number] Patient Account Number: [id number] Date of Birth/Sex: Treating RN: 1951/05/10 (70 y.o. Anthony Case, Anthony Case Primary Care Provider: Leanna Case Other Clinician: Referring Provider: Treating Provider/Extender: Anthony Case in Treatment: 21 Information Obtained from: Patient Chief Complaint Left great toe wound Electronic Signature(s) Signed: 05/09/2021 8:48:03 AM By: Anthony Shan DO Entered By: Anthony Case on 05/09/2021 08:44:00 -------------------------------------------------------------------------------- HPI Details Patient Name: Date of Service: [place] Anthony Case, [name] 05/09/2021 8:00 A M Medical Record Number: [id number] Patient Account Number: [id number] Date of Birth/Sex: Treating RN: [birth year] (70 y.o. 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 He presents to the clinic today for a left great toe wound. He states that he stepped on something sharp that sliced the top part of his left great toe back in December 2021. He has been evaluated by his primary care physician and reports taking 2 rounds of antibiotics. He recently saw podiatry and the toe was debrided and padded. He is not currently doing any dressing changes to the wound. He denies increased warmth or erythema to the foot. He denies fever/chills, purulent drainage or nausea/vomiting. 12/13/2020:  Patient presents for 1 week follow-up. He was evaluated last week for his left great toe ulcer and started on Hydrofera Blue. I sent him for ABIs with TBI's and he had this done. He also started using a surgical shoe to help with pressure relief on the great toe. He has tolerated this well. He states he saw his podiatrist yesterday for follow-up. They Have referred him to see Dr. Earleen Case. They are also planning to do a skin graft to the wound that if it has not resolved in the next month. Overall patient states he feels well and has no complaints today. 5/5; patient presents for 1 week follow-up. He reports no issues today. He has been using Hydrofera Blue. He states he saw Dr. Earleen Case and had a Imaging of his lower extremities. He states he feels well overall. He is going on a trip to [place] in 1-2 weeks. 5/17; patient presents for follow-up. Its been almost 2 weeks since he was last seen. He has been using Hydrofera Blue every other day. He overall feels well and denies signs of infection. He is leaving for a month-long trip to [place] tomorrow. 6/21; patient presents for 1 month follow-up. He recently went on a 1 month trip to [place]. He had a relaxing and enjoyable time. He has been using Hydrofera Blue daily on the wound. He recently ran out of supplies and has been keeping it covered with a Band-Aid. He denies signs of infection and has no issues or complaints today. 7/5; patient presents for 2-week follow-up. He has been using Hydrofera Blue to the wound bed. He is wearing closed toed shoes and states he is not using his sandals anymore. He reports walking barefoot but keeps the wound area covered. He denies signs of infection. 7/29; patient presents for 2-week follow-up. He has been using Hydrofera Blue to the wound bed. He has been on his feet more  lately due to work. But he reports continued improvement to the wound area. He denies signs of infection. 8/18; patient presents for 2-week  follow-up. He has been using Hydrofera Blue to the wound bed however he states this is scabbed over with no drainage and no issues. He denies signs of infection. 9/15; patient presents for follow-up. At last clinic visit the wound had scabbed over with no issues. We decided to give it 4 weeks and see if anything presented itself. He states there is still a scab and he continues to have no issues including no drainage. Electronic Signature(s) Signed: 05/09/2021 8:48:03 AM By: Anthony Shan DO Entered By: Anthony Case on 05/09/2021 08:45:09 -------------------------------------------------------------------------------- Physical Exam Details Patient Name: Date of Service: [place] Anthony Case, [name] 05/09/2021 8:00 A M Medical Record Number: [id number] Patient Account Number: [id number] Date of Birth/Sex: Treating RN: 12-09-1950 (70 y.o. Anthony Case Primary Care Provider: Leanna Case Other Clinician: Referring Provider: Treating Provider/Extender: Anthony Case in Treatment: 21 Constitutional respirations regular, non-labored and within target range for patient.. Cardiovascular 2+ dorsalis pedis/posterior tibialis pulses. Psychiatric pleasant and cooperative. Notes Left foot: Distal end of the great toe with scab. No drainage noted on palpation. No signs of infection. Electronic Signature(s) Signed: 05/09/2021 8:48:03 AM By: Anthony Shan DO Entered By: Anthony Case on 05/09/2021 08:45:32 -------------------------------------------------------------------------------- Physician Orders Details Patient Name: Date of Service: [place] Anthony Case, [name] 05/09/2021 8:00 A M Medical Record Number: [id number] Patient Account Number: [id number] Date of Birth/Sex: Treating RN: 05-05-1951 (70 y.o. Anthony Case Primary Care Provider: Leanna Case Other Clinician: Referring Provider: Treating Provider/Extender: Anthony Case in Treatment: 21 Verbal / Phone Orders: No Diagnosis Coding ICD-10 Coding Code Description E11.621 Type 2 diabetes mellitus with foot ulcer E11.40 Type 2 diabetes mellitus with diabetic neuropathy, unspecified Z86.718 Personal history of other venous thrombosis and embolism I10 Essential (primary) hypertension Discharge From [facility] Services Discharge from [place] close watch on healed area and call if any changes. Bathing/ Shower/ Hygiene May shower and wash wound with soap and water. Electronic Signature(s) Signed: 05/09/2021 8:48:03 AM By: Anthony Shan DO Entered By: Anthony Case on 05/09/2021 08:45:43 -------------------------------------------------------------------------------- Problem List Details Patient Name: Date of Service: [place] Anthony Case, [name] 05/09/2021 8:00 A M Medical Record Number: [id number] Patient Account Number: [id number] Date of Birth/Sex: Treating RN: 04/14/1951 (70 y.o. Anthony Case Primary Care Provider: Leanna Case Other Clinician: Referring Provider: Treating Provider/Extender: Anthony Case in Treatment: 21 Active Problems ICD-10 Encounter Code Description Active Date MDM Diagnosis E11.621 Type 2 diabetes mellitus with foot ulcer 12/07/2020 No Yes E11.40 Type 2 diabetes mellitus with diabetic neuropathy, unspecified 12/07/2020 No Yes Z86.718 Personal history of other venous thrombosis and embolism 12/07/2020 No Yes I10 Essential (primary) hypertension 12/07/2020 No Yes Inactive Problems Resolved Problems Electronic Signature(s) Signed: 05/09/2021 8:48:03 AM By: Anthony Shan DO Entered By: Anthony Case on 05/09/2021 08:43:04 -------------------------------------------------------------------------------- Progress Note Details Patient Name: Date of Service: [place] Anthony Case, [name] 05/09/2021 8:00 A M Medical Record Number: [id number] Patient Account Number:  [id number] Date of Birth/Sex: Treating RN: [birth year] (71 y.o. Anthony Case, Anthony Case Primary Care Provider: Leanna Case Other Clinician: Referring Provider: Treating Provider/Extender: Anthony Case in Treatment: 21 Subjective Chief Complaint Information obtained from Patient Left great toe wound History of Present Illness (HPI) Admission 12/07/2020 Anthony Case is a 70 year old male with a past medical history of insulin-dependent type  2 diabetes complicated by peripheral neuropathy, hypertension and history of DVT on Xarelto. He presents to the clinic today for a left great toe wound. He states that he stepped on something sharp that sliced the top part of his left great toe back in December 2021. He has been evaluated by his primary care physician and reports taking 2 rounds of antibiotics. He recently saw podiatry and the toe was debrided and padded. He is not currently doing any dressing changes to the wound. He denies increased warmth or erythema to the foot. He denies fever/chills, purulent drainage or nausea/vomiting. 12/13/2020: Patient presents for 1 week follow-up. He was evaluated last week for his left great toe ulcer and started on Hydrofera Blue. I sent him for ABIs with TBI's and he had this done. He also started using a surgical shoe to help with pressure relief on the great toe. He has tolerated this well. He states he saw his podiatrist yesterday for follow-up. They Have referred him to see Dr. Earleen Case. They are also planning to do a skin graft to the wound that if it has not resolved in the next month. Overall patient states he feels well and has no complaints today. 5/5; patient presents for 1 week follow-up. He reports no issues today. He has been using Hydrofera Blue. He states he saw Dr. Earleen Case and had a Imaging of his lower extremities. He states he feels well overall. He is going on a trip to [place] in 1-2 weeks. 5/17; patient  presents for follow-up. Its been almost 2 weeks since he was last seen. He has been using Hydrofera Blue every other day. He overall feels well and denies signs of infection. He is leaving for a month-long trip to [place] tomorrow. 6/21; patient presents for 1 month follow-up. He recently went on a 1 month trip to [place]. He had a relaxing and enjoyable time. He has been using Hydrofera Blue daily on the wound. He recently ran out of supplies and has been keeping it covered with a Band-Aid. He denies signs of infection and has no issues or complaints today. 7/5; patient presents for 2-week follow-up. He has been using Hydrofera Blue to the wound bed. He is wearing closed toed shoes and states he is not using his sandals anymore. He reports walking barefoot but keeps the wound area covered. He denies signs of infection. 7/29; patient presents for 2-week follow-up. He has been using Hydrofera Blue to the wound bed. He has been on his feet more lately due to work. But he reports continued improvement to the wound area. He denies signs of infection. 8/18; patient presents for 2-week follow-up. He has been using Hydrofera Blue to the wound bed however he states this is scabbed over with no drainage and no issues. He denies signs of infection. 9/15; patient presents for follow-up. At last clinic visit the wound had scabbed over with no issues. We decided to give it 4 weeks and see if anything presented itself. He states there is still a scab and he continues to have no issues including no drainage. Patient History Information obtained from Patient. Family History Heart Disease, No family history of Cancer, Diabetes, Hereditary Spherocytosis, Hypertension, Kidney Disease, Lung Disease, Seizures, Stroke, Thyroid Problems, Tuberculosis. Social History Never smoker, Marital Status - Married, Alcohol Use - Rarely - prior history, Drug Use - No History, Caffeine Use - Rarely. Medical History Eyes Denies  history of Cataracts, Glaucoma, Optic Neuritis Ear/Nose/Mouth/Throat Denies history of Chronic sinus problems/congestion, Middle  ear problems Hematologic/Lymphatic Denies history of Anemia, Hemophilia, Human Immunodeficiency Virus, Lymphedema, Sickle Cell Disease Respiratory Denies history of Aspiration, Asthma, Chronic Obstructive Pulmonary Disease (COPD), Pneumothorax, Sleep Apnea, Tuberculosis Cardiovascular Patient has history of Deep Vein Thrombosis - hx DVT Hypertension , Denies history of Angina, Arrhythmia, Congestive Heart Failure, Coronary Artery Disease, Hypotension, Myocardial Infarction, Peripheral Arterial Disease, Peripheral Venous Disease, Phlebitis, Vasculitis Gastrointestinal Denies history of Cirrhosis , Colitis, Crohnoos, Hepatitis A, Hepatitis B, Hepatitis C Endocrine Patient has history of Type II Diabetes Denies history of Type I Diabetes Genitourinary Denies history of End Stage Renal Disease Immunological Denies history of Lupus Erythematosus, Raynaudoos, Scleroderma Integumentary (Skin) Denies history of History of Burn Musculoskeletal Patient has history of Rheumatoid Arthritis Denies history of Gout, Osteoarthritis, Osteomyelitis Neurologic Patient has history of Neuropathy Denies history of Dementia, Quadriplegia, Paraplegia, Seizure Disorder Oncologic Denies history of Received Chemotherapy, Received Radiation Hospitalization/Surgery History - spine surgery. Medical A Surgical History Notes nd Eyes DM retinopathy Objective Constitutional respirations regular, non-labored and within target range for patient.. Vitals Time Taken: 8:04 AM, Height: 71 in, Weight: 255 lbs, BMI: 35.6, Temperature: 98.2 F, Pulse: 80 bpm, Respiratory Rate: 16 breaths/min, Blood Pressure: 112/74 mmHg. Cardiovascular 2+ dorsalis pedis/posterior tibialis pulses. Psychiatric pleasant and cooperative. General Notes: Left foot: Distal end of the great toe with scab. No  drainage noted on palpation. No signs of infection. Integumentary (Hair, Skin) Wound #1 status is Healed - Epithelialized. Original cause of wound was Trauma. The date acquired was: 08/10/2020. The wound has been in treatment 21 weeks. The wound is located on the [employer]. The wound measures 0cm length x 0cm width x 0cm depth; 0cm^2 area and 0cm^3 volume. oe Assessment Active Problems ICD-10 Type 2 diabetes mellitus with foot ulcer Type 2 diabetes mellitus with diabetic neuropathy, unspecified Personal history of other venous thrombosis and embolism Essential (primary) hypertension Patient presents for 1 month follow-up. He continues to have a scab/callus with no issues. No drainage noted. At this time I think he can be discharged from the clinic and call with any questions or concerns. I asked that he keep his feet protected and not walk around barefoot as he reports he often does. Plan Discharge From [facility] Services: Discharge from [place] close watch on healed area and call if any changes. Bathing/ Shower/ Hygiene: May shower and wash wound with soap and water. 1. Discharge from clinic due to closed wound 2. Follow-up as needed Electronic Signature(s) Signed: 05/09/2021 8:48:03 AM By: Anthony Shan DO Entered By: Anthony Case on 05/09/2021 08:47:20 -------------------------------------------------------------------------------- HxROS Details Patient Name: Date of Service: [place] Anthony Case, [name] 05/09/2021 8:00 A M Medical Record Number: [id number] Patient Account Number: [id number] Date of Birth/Sex: Treating RN: Oct 04, 1950 (70 y.o. Anthony Case, Anthony Case Primary Care Provider: Leanna Case Other Clinician: Referring Provider: Treating Provider/Extender: Anthony Case in Treatment: 21 Information Obtained From Patient Eyes Medical History: Negative for: Cataracts; Glaucoma; Optic Neuritis Past Medical History  Notes: DM retinopathy Ear/Nose/Mouth/Throat Medical History: Negative for: Chronic sinus problems/congestion; Middle ear problems Hematologic/Lymphatic Medical History: Negative for: Anemia; Hemophilia; Human Immunodeficiency Virus; Lymphedema; Sickle Cell Disease Respiratory Medical History: Negative for: Aspiration; Asthma; Chronic Obstructive Pulmonary Disease (COPD); Pneumothorax; Sleep Apnea; Tuberculosis Cardiovascular Medical History: Positive for: Deep Vein Thrombosis - hx DVT Hypertension ; Negative for: Angina; Arrhythmia; Congestive Heart Failure; Coronary Artery Disease; Hypotension; Myocardial Infarction; Peripheral Arterial Disease; Peripheral Venous Disease; Phlebitis; Vasculitis Gastrointestinal Medical History: Negative for: Cirrhosis ; Colitis; Crohns;  Hepatitis A; Hepatitis B; Hepatitis C Endocrine Medical History: Positive for: Type II Diabetes Negative for: Type I Diabetes Treated with: Insulin, Oral agents Blood sugar tested every day: No Genitourinary Medical History: Negative for: End Stage Renal Disease Immunological Medical History: Negative for: Lupus Erythematosus; Raynauds; Scleroderma Integumentary (Skin) Medical History: Negative for: History of Burn Musculoskeletal Medical History: Positive for: Rheumatoid Arthritis Negative for: Gout; Osteoarthritis; Osteomyelitis Neurologic Medical History: Positive for: Neuropathy Negative for: Dementia; Quadriplegia; Paraplegia; Seizure Disorder Oncologic Medical History: Negative for: Received Chemotherapy; Received Radiation Immunizations Pneumococcal Vaccine: Received Pneumococcal Vaccination: No Implantable Devices None Hospitalization / Surgery History Type of Hospitalization/Surgery spine surgery Family and Social History Cancer: No; Diabetes: No; Heart Disease: Yes; Hereditary Spherocytosis: No; Hypertension: No; Kidney Disease: No; Lung Disease: No; Seizures: No; Stroke: No; Thyroid  Problems: No; Tuberculosis: No; Never smoker; Marital Status - Married; Alcohol Use: Rarely - prior history; Drug Use: No History; Caffeine Use: Rarely; Financial Concerns: No; Food, Clothing or Shelter Needs: No; Support System Lacking: No; Transportation Concerns: No Electronic Signature(s) Signed: 05/09/2021 8:48:03 AM By: Anthony Shan DO Signed: 05/09/2021 4:34:42 PM By: Rhae Hammock RN Entered By: Anthony Case on 05/09/2021 08:45:14 -------------------------------------------------------------------------------- SuperBill Details Patient Name: Date of Service: [place] Anthony Case, [name] 05/09/2021 Medical Record Number: [id number] Patient Account Number: [id number] Date of Birth/Sex: Treating RN: 1951-07-21 (70 y.o. Anthony Case Primary Care Provider: Leanna Case Other Clinician: Referring Provider: Treating Provider/Extender: Anthony Case in Treatment: 21 Diagnosis Coding ICD-10 Codes Code Description [phone] Type 2 diabetes mellitus with foot ulcer E11.40 Type 2 diabetes mellitus with diabetic neuropathy, unspecified Z86.718 Personal history of other venous thrombosis and embolism I10 Essential (primary) hypertension Facility Procedures CPT4 Code: [id number] Description: [phone] - WOUND CARE VISIT-LEV 2 EST PT Modifier: Quantity: 1 Physician Procedures : CPT4 Code Description Modifier [id number] - WC PHYS LEVEL 3 - EST PT ICD-10 Diagnosis Description E11.621 Type 2 diabetes mellitus with foot ulcer E11.40 Type 2 diabetes mellitus with diabetic neuropathy, unspecified Z86.718 Personal history of  other venous thrombosis and embolism I10 Essential (primary) hypertension Quantity: 1 Electronic Signature(s) Signed: 05/09/2021 8:48:03 AM By: Anthony Shan DO Entered By: Anthony Case on 05/09/2021 08:47:33

## 2021-05-09 NOTE — Progress Notes (Signed)
ROWEN, BEICHLER ([id number]) Visit Report for 05/09/2021 Arrival Information Details Patient Name: Date of Service: [place], [name] 05/09/2021 8:00 A M Medical Record Number: [id number] Patient Account Number: [id number] Date of Birth/Sex: Treating RN: September 12, 1950 (70 y.o. Anthony Case Primary Care Anthony Case: Anthony Case Other Clinician: Referring Anthony Case: Treating Anthony Case/Extender: Anthony Case in Treatment: 21 Visit Information History Since Last Visit Added or deleted any medications: No Patient Arrived: Ambulatory Any new allergies or adverse reactions: No Arrival Time: 08:03 Had a fall or experienced change in No Transfer Assistance: None activities of daily living that may affect Patient Identification Verified: Yes risk of falls: Secondary Verification Process Completed: Yes Signs or symptoms of abuse/neglect since last visito No Patient Requires Transmission-Based Precautions: No Hospitalized since last visit: No Patient Has Alerts: No Implantable device outside of the clinic excluding No cellular tissue based products placed in the center since last visit: Has Dressing in Place as Prescribed: Yes Pain Present Now: No Electronic Signature(s) Signed: 05/09/2021 5:57:57 PM By: Lorrin Jackson Entered By: Lorrin Jackson on 05/09/2021 08:04:18 -------------------------------------------------------------------------------- Clinic Level of Care Assessment Details Patient Name: Date of Service: [place], [name] 05/09/2021 8:00 A M Medical Record Number: [id number] Patient Account Number: [id number] Date of Birth/Sex: Treating RN: 1950-09-10 (70 y.o. Anthony Case Primary Care Minard Millirons: Anthony Case Other Clinician: Referring Anthony Case: Treating Anthony Case/Extender: Anthony Case in Treatment: 21 Clinic Level of Care Assessment Items TOOL 4 Quantity Score X- 1 0 Use when only an EandM is  performed on FOLLOW-UP visit ASSESSMENTS - Nursing Assessment / Reassessment X- 1 10 Reassessment of Co-morbidities (includes updates in patient status) X- 1 5 Reassessment of Adherence to Treatment Plan ASSESSMENTS - Wound and Skin A ssessment / Reassessment X - Simple Wound Assessment / Reassessment - one wound 1 5 '[]'$  - 0 Complex Wound Assessment / Reassessment - multiple wounds '[]'$  - 0 Dermatologic / Skin Assessment (not related to wound area) ASSESSMENTS - Focused Assessment '[]'$  - 0 Circumferential Edema Measurements - multi extremities '[]'$  - 0 Nutritional Assessment / Counseling / Intervention '[]'$  - 0 Lower Extremity Assessment (monofilament, tuning fork, pulses) '[]'$  - 0 Peripheral Arterial Disease Assessment (using hand held doppler) ASSESSMENTS - Ostomy and/or Continence Assessment and Care '[]'$  - 0 Incontinence Assessment and Management '[]'$  - 0 Ostomy Care Assessment and Management (repouching, etc.) PROCESS - Coordination of Care '[]'$  - 0 Simple Patient / Family Education for ongoing care X- 1 20 Complex (extensive) Patient / Family Education for ongoing care '[]'$  - 0 Staff obtains Programmer, systems, Records, T Results / Process Orders est '[]'$  - 0 Staff telephones HHA, Nursing Homes / Clarify orders / etc '[]'$  - 0 Routine Transfer to another Facility (non-emergent condition) '[]'$  - 0 Routine Hospital Admission (non-emergent condition) '[]'$  - 0 New Admissions / Biomedical engineer / Ordering NPWT Apligraf, etc. , '[]'$  - 0 Emergency Hospital Admission (emergent condition) '[]'$  - 0 Simple Discharge Coordination '[]'$  - 0 Complex (extensive) Discharge Coordination PROCESS - Special Needs '[]'$  - 0 Pediatric / Minor Patient Management '[]'$  - 0 Isolation Patient Management '[]'$  - 0 Hearing / Language / Visual special needs '[]'$  - 0 Assessment of Community assistance (transportation, D/C planning, etc.) '[]'$  - 0 Additional assistance / Altered mentation '[]'$  - 0 Support Surface(s) Assessment  (bed, cushion, seat, etc.) INTERVENTIONS - Wound Cleansing / Measurement X - Simple Wound Cleansing - one wound 1 5 '[]'$  - 0 Complex Wound Cleansing - multiple wounds '[]'$  -  0 Wound Imaging (photographs - any number of wounds) '[]'$  - 0 Wound Tracing (instead of photographs) '[]'$  - 0 Simple Wound Measurement - one wound '[]'$  - 0 Complex Wound Measurement - multiple wounds INTERVENTIONS - Wound Dressings '[]'$  - 0 Small Wound Dressing one or multiple wounds '[]'$  - 0 Medium Wound Dressing one or multiple wounds '[]'$  - 0 Large Wound Dressing one or multiple wounds '[]'$  - 0 Application of Medications - topical '[]'$  - 0 Application of Medications - injection INTERVENTIONS - Miscellaneous '[]'$  - 0 External ear exam '[]'$  - 0 Specimen Collection (cultures, biopsies, blood, body fluids, etc.) '[]'$  - 0 Specimen(s) / Culture(s) sent or taken to Lab for analysis '[]'$  - 0 Patient Transfer (multiple staff / Civil Service fast streamer / Similar devices) '[]'$  - 0 Simple Staple / Suture removal (25 or less) '[]'$  - 0 Complex Staple / Suture removal (26 or more) '[]'$  - 0 Hypo / Hyperglycemic Management (close monitor of Blood Glucose) '[]'$  - 0 Ankle / Brachial Index (ABI) - do not check if billed separately X- 1 5 Vital Signs Has the patient been seen at the hospital within the last three years: Yes Total Score: 50 Level Of Care: New/Established - Level 2 Electronic Signature(s) Signed: 05/09/2021 5:57:57 PM By: Lorrin Jackson Entered By: Lorrin Jackson on 05/09/2021 08:34:25 -------------------------------------------------------------------------------- Encounter Discharge Information Details Patient Name: Date of Service: [place] Anthony Case, [name] 05/09/2021 8:00 A M Medical Record Number: [id number] Patient Account Number: [id number] Date of Birth/Sex: Treating RN: 01/08/1951 (70 y.o. Anthony Case Primary Care Anthony Case: Anthony Case Other Clinician: Referring Anthony Case: Treating Anthony Case/Extender: Anthony Case in Treatment: 21 Encounter Discharge Information Items Discharge Condition: Stable Ambulatory Status: Ambulatory Discharge Destination: Home Transportation: Private Auto Schedule Follow-up Appointment: No Clinical Summary of Care: Provided on 05/09/2021 Form Type Recipient Paper Patient Patient Electronic Signature(s) Signed: 05/09/2021 5:57:57 PM By: Lorrin Jackson Entered By: Lorrin Jackson on 05/09/2021 08:35:19 -------------------------------------------------------------------------------- Lower Extremity Assessment Details Patient Name: Date of Service: [place] Anthony Case, [name] 05/09/2021 8:00 A M Medical Record Number: [id number] Patient Account Number: [id number] Date of Birth/Sex: Treating RN: 1951-06-16 (70 y.o. Anthony Case Primary Care Jordis Repetto: Anthony Case Other Clinician: Referring Ovella Manygoats: Treating Ebany Bowermaster/Extender: Anthony Case in Treatment: 21 Edema Assessment Assessed: Shirlyn Goltz: Yes] Patrice Paradise: No] Edema: [Left: N] [Right: o] Calf Left: Right: Point of Measurement: 34 cm From Medial Instep 38.5 cm Ankle Left: Right: Point of Measurement: 12 cm From Medial Instep 23 cm Vascular Assessment Pulses: Dorsalis Pedis Palpable: [Left:Yes] Electronic Signature(s) Signed: 05/09/2021 5:57:57 PM By: Lorrin Jackson Entered By: Lorrin Jackson on 05/09/2021 08:08:46 -------------------------------------------------------------------------------- Multi Wound Chart Details Patient Name: Date of Service: [place] Anthony Case, [name] 05/09/2021 8:00 A M Medical Record Number: [id number] Patient Account Number: [id number] Date of Birth/Sex: Treating RN: 14-Mar-1951 (70 y.o. 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: Extremity Deep Vein Thrombosis, Hypertension, N/A N/A Comorbid History: Type II Diabetes, Rheumatoid Arthritis, Neuropathy 08/10/2020 N/A N/A Date Acquired: 21 N/A N/A Weeks of Treatment: Healed - Epithelialized N/A N/A Wound Status: 0x0x0 N/A N/A Measurements L x W x D (cm) 0  N/A N/A A (cm) : rea 0 N/A N/A Volume (cm) : 100.00% N/A N/A % Reduction in Area: 100.00% N/A N/A % Reduction in Volume: Grade 2 N/A N/A Classification: Treatment Notes Electronic Signature(s) Signed: 05/09/2021 8:48:03 AM By: Kalman Shan DO Signed: 05/09/2021 4:34:42 PM By: Rhae Hammock RN Entered By: Kalman Shan on 05/09/2021 08:43:41 -------------------------------------------------------------------------------- Multi-Disciplinary Care Plan Details Patient Name: Date of Service: [place] Anthony Case, [name] 05/09/2021 8:00 A M Medical Record Number: [id number] Patient Account Number: [id number] Date of Birth/Sex: Treating RN: 1951-04-16 (70 y.o. Anthony Case Primary Care Carrolyn Hilmes: Anthony Case Other Clinician: Referring Shahab Polhamus: Treating Angeliki Mates/Extender: Anthony Case in Treatment: [place] reviewed with physician Active Inactive Electronic Signature(s) Signed: 05/09/2021 5:57:57 PM By: Lorrin Jackson Entered By: Lorrin Jackson on 05/09/2021 08:32:58 -------------------------------------------------------------------------------- Pain Assessment Details Patient Name: Date of Service: [place] Anthony Case, [name] 05/09/2021 8:00 A M Medical Record Number: [id number] Patient Account Number: [id number] Date of Birth/Sex: Treating RN: [birth year] (70 y.o. Anthony Case Primary Care Zelta Enfield: Anthony Case Other Clinician: Referring Pavan Bring: Treating Jamila Slatten/Extender: Anthony Case in Treatment: 21 Active Problems Location of Pain Severity and Description of Pain Patient Has Paino No Site Locations Pain Management and Medication Current Pain Management: Electronic Signature(s) Signed: 05/09/2021 5:57:57 PM By: Lorrin Jackson Entered By: Lorrin Jackson on 05/09/2021 08:04:36 -------------------------------------------------------------------------------- Patient/Caregiver Education Details Patient Name: Date of Service: [place] Anthony Case, [name] 9/15/2022andnbsp8:00 A M Medical Record Number: [id number] Patient Account Number: [id number] Date of Birth/Gender: Treating RN: Jul 19, 1951 (70 y.o. Anthony Case Primary Care Physician: Anthony Case Other Clinician: Referring Physician: Treating Physician/Extender: Anthony Case in Treatment: 21 Education Assessment Education Provided To: Patient Education Topics Provided Wound/Skin Impairment: Methods: Explain/Verbal, Printed Responses: State content correctly [employer]) Signed: 05/09/2021 5:57:57 PM By: Lorrin Jackson Entered By: Lorrin Jackson on 05/09/2021 08:09:54 -------------------------------------------------------------------------------- Wound Assessment Details Patient Name: Date of Service: [place] Anthony Case, [name] 05/09/2021 8:00 A M Medical Record Number: [id number] Patient Account Number: [id number] Date of Birth/Sex: Treating RN: 1951/08/24 (70 y.o. Anthony Case Primary Care Jerin Franzel: Anthony Case Other Clinician: Referring Diane Mochizuki: Treating Siyona Coto/Extender: Anthony Case in Treatment: 21 Wound Status Wound Number: 1 Primary Diabetic Wound/Ulcer of the Lower Extremity Etiology: Wound Location: Left, Plantar T Great oe Wound Healed -  Epithelialized Wounding Event: Trauma Status: Date Acquired: 08/10/2020 Comorbid Deep Vein Thrombosis, Hypertension, Type II Diabetes, Weeks Of Treatment: 21 History: Rheumatoid Arthritis, Neuropathy Clustered Wound: No Photos Wound Measurements Length: (cm) 0 Width: (cm) 0 Depth: (cm) 0 Area: (cm) Volume: (cm) % Reduction in Area: 100% % Reduction in Volume: 100% 0 0 Wound Description Classification: Grade 2 Electronic Signature(s) Signed: 05/09/2021 5:57:57 PM By: Lorrin Jackson Entered By: Lorrin Jackson on 05/09/2021 08:32:16 -------------------------------------------------------------------------------- Vitals Details Patient Name: Date of Service: [place] Anthony Case, [name] 05/09/2021 8:00 A M Medical Record Number: [id number] Patient Account Number: [id number] Date of Birth/Sex: Treating RN: 1951/06/03 (70 y.o. Anthony Case Primary Care Kennidi Yoshida: Anthony Case Other Clinician: Referring Harlym Gehling: Treating Corlene Sabia/Extender: Anthony Case in Treatment: 21 Vital Signs Time Taken: 08:04 Temperature (F): 98.2 Height (in): 71 Pulse (bpm): 80 Weight (lbs): 255 Respiratory Rate (breaths/min): 16 Body Mass Index (BMI): 35.6 Blood Pressure (mmHg): 112/74 Reference Range: 80 - 120 mg / dl Electronic Signature(s) Signed: 05/09/2021 5:57:57 PM By: Lorrin Jackson Entered By: Lorrin Jackson on 05/09/2021 08:05:20

## 2021-05-23 DIAGNOSIS — K753 Granulomatous hepatitis, not elsewhere classified: Secondary | ICD-10-CM | POA: Diagnosis not present

## 2021-05-23 DIAGNOSIS — N200 Calculus of kidney: Secondary | ICD-10-CM | POA: Diagnosis not present

## 2021-05-23 DIAGNOSIS — N201 Calculus of ureter: Secondary | ICD-10-CM | POA: Diagnosis not present

## 2021-05-23 DIAGNOSIS — N400 Enlarged prostate without lower urinary tract symptoms: Secondary | ICD-10-CM | POA: Diagnosis not present

## 2021-05-23 DIAGNOSIS — R1032 Left lower quadrant pain: Secondary | ICD-10-CM | POA: Diagnosis not present

## 2021-05-23 DIAGNOSIS — I700 Atherosclerosis of aorta: Secondary | ICD-10-CM | POA: Diagnosis not present

## 2021-05-23 DIAGNOSIS — N132 Hydronephrosis with renal and ureteral calculous obstruction: Secondary | ICD-10-CM | POA: Diagnosis not present

## 2021-05-28 ENCOUNTER — Ambulatory Visit: Payer: Medicare Other

## 2021-05-28 ENCOUNTER — Other Ambulatory Visit: Payer: Self-pay

## 2021-05-28 DIAGNOSIS — R0989 Other specified symptoms and signs involving the circulatory and respiratory systems: Secondary | ICD-10-CM

## 2021-05-28 DIAGNOSIS — I10 Essential (primary) hypertension: Secondary | ICD-10-CM

## 2021-05-28 DIAGNOSIS — R0609 Other forms of dyspnea: Secondary | ICD-10-CM

## 2021-05-28 DIAGNOSIS — Q231 Congenital insufficiency of aortic valve: Secondary | ICD-10-CM

## 2021-05-28 DIAGNOSIS — I7141 Pararenal abdominal aortic aneurysm, without rupture: Secondary | ICD-10-CM

## 2021-05-30 DIAGNOSIS — N201 Calculus of ureter: Secondary | ICD-10-CM | POA: Diagnosis not present

## 2021-05-31 ENCOUNTER — Other Ambulatory Visit: Payer: Self-pay | Admitting: Urology

## 2021-06-01 DIAGNOSIS — Z23 Encounter for immunization: Secondary | ICD-10-CM | POA: Diagnosis not present

## 2021-06-03 DIAGNOSIS — S22080A Wedge compression fracture of T11-T12 vertebra, initial encounter for closed fracture: Secondary | ICD-10-CM | POA: Diagnosis not present

## 2021-06-03 DIAGNOSIS — M545 Low back pain, unspecified: Secondary | ICD-10-CM | POA: Diagnosis not present

## 2021-06-03 DIAGNOSIS — L409 Psoriasis, unspecified: Secondary | ICD-10-CM | POA: Diagnosis not present

## 2021-06-03 DIAGNOSIS — Z79899 Other long term (current) drug therapy: Secondary | ICD-10-CM | POA: Diagnosis not present

## 2021-06-03 DIAGNOSIS — R7989 Other specified abnormal findings of blood chemistry: Secondary | ICD-10-CM | POA: Diagnosis not present

## 2021-06-03 DIAGNOSIS — M150 Primary generalized (osteo)arthritis: Secondary | ICD-10-CM | POA: Diagnosis not present

## 2021-06-03 DIAGNOSIS — L4050 Arthropathic psoriasis, unspecified: Secondary | ICD-10-CM | POA: Diagnosis not present

## 2021-06-03 DIAGNOSIS — M79675 Pain in left toe(s): Secondary | ICD-10-CM | POA: Diagnosis not present

## 2021-06-03 DIAGNOSIS — Z6833 Body mass index (BMI) 33.0-33.9, adult: Secondary | ICD-10-CM | POA: Diagnosis not present

## 2021-06-03 DIAGNOSIS — E669 Obesity, unspecified: Secondary | ICD-10-CM | POA: Diagnosis not present

## 2021-06-05 DIAGNOSIS — L4050 Arthropathic psoriasis, unspecified: Secondary | ICD-10-CM | POA: Diagnosis not present

## 2021-06-19 ENCOUNTER — Other Ambulatory Visit: Payer: Self-pay | Admitting: Interventional Radiology

## 2021-06-19 DIAGNOSIS — S91109A Unspecified open wound of unspecified toe(s) without damage to nail, initial encounter: Secondary | ICD-10-CM

## 2021-06-19 DIAGNOSIS — I739 Peripheral vascular disease, unspecified: Secondary | ICD-10-CM

## 2021-06-20 DIAGNOSIS — Z111 Encounter for screening for respiratory tuberculosis: Secondary | ICD-10-CM | POA: Diagnosis not present

## 2021-06-20 DIAGNOSIS — R5383 Other fatigue: Secondary | ICD-10-CM | POA: Diagnosis not present

## 2021-06-20 DIAGNOSIS — Z79899 Other long term (current) drug therapy: Secondary | ICD-10-CM | POA: Diagnosis not present

## 2021-06-20 DIAGNOSIS — L4050 Arthropathic psoriasis, unspecified: Secondary | ICD-10-CM | POA: Diagnosis not present

## 2021-06-20 NOTE — Patient Instructions (Addendum)
DUE TO COVID-19 ONLY ONE VISITOR IS ALLOWED TO COME WITH YOU AND STAY IN THE WAITING ROOM ONLY DURING PRE OP AND PROCEDURE DAY OF SURGERY IF YOU ARE GOING HOME AFTER SURGERY.                Anthony Case     Your procedure is scheduled on: 07/02/21   Report to [facility] Main  Entrance   Report to admitting at   8:45 AM     Call this number if you have problems the morning of surgery [phone]    Remember: Do not eat food or drink  after Midnight the night before your surgery,       BRUSH YOUR TEETH MORNING OF SURGERY AND RINSE YOUR MOUTH OUT, NO CHEWING GUM CANDY OR MINTS.     Take these medicines the morning of surgery with A SIP OF WATER: Metoprolol  Stop taking __Xarelto_________on Anthony Case does take on 11/5_________as instructed.    How to Manage Your Diabetes Before and After Surgery  Why is it important to control my blood sugar before and after surgery? Improving blood sugar levels before and after surgery helps healing and can limit problems. A way of improving blood sugar control is eating a healthy diet by:  Eating less sugar and carbohydrates  Increasing activity/exercise  Talking with your doctor about reaching your blood sugar goals High blood sugars (greater than 180 mg/dL) can raise your risk of infections and slow your recovery, so you will need to focus on controlling your diabetes during the weeks before surgery. Make sure that the doctor who takes care of your diabetes knows about your planned surgery including the date and location.  How do I manage my blood sugar before surgery? Check your blood sugar at least 4 times a day, starting 2 days before surgery, to make sure that the level is not too high or low. Check your blood sugar the morning of your surgery when you wake up and every 2 hours until you get to the Short Stay unit. If your blood sugar is less than 70 mg/dL, you will need to treat for low blood sugar: Do not take  insulin. Treat a low blood sugar (less than 70 mg/dL) with  cup of clear juice (cranberry or apple), 4 glucose tablets, OR glucose gel. Recheck blood sugar in 15 minutes after treatment (to make sure it is greater than 70 mg/dL). If your blood sugar is not greater than 70 mg/dL on recheck, call [phone] for further instructions. Report your blood sugar to the short stay nurse when you get to Short Stay.  If you are admitted to the hospital after surgery: Your blood sugar will be checked by the staff and you will probably be given insulin after surgery (instead of oral diabetes medicines) to make sure you have good blood sugar levels. The goal for blood sugar control after surgery is 80-180 mg/dL.   WHAT DO I DO ABOUT MY DIABETES MEDICATION?  OK to take Glipizide  and metformin the day before surgery.  Do not take your farxiga the day before surgery.    Do not take oral diabetes medicines (pills) the morning of surgery.  THE NIGHT BEFORE SURGERY, take  30  units of Levemir  insulin. ( 50% of regular dose)      THE MORNING OF SURGERY, take 0  units of    insulin.  You may not have any metal on your body including              piercings  Do not wear jewelry, lotions, powders or  deodorant             Men may shave face and neck.   Do not bring valuables to the hospital. [name].  Contacts, dentures or bridgework may not be worn into surgery.     Patients discharged the day of surgery will not be allowed to drive home.  IF YOU ARE HAVING SURGERY AND GOING HOME THE SAME DAY, YOU MUST HAVE AN ADULT TO DRIVE YOU HOME AND BE WITH YOU FOR 24 HOURS. YOU MAY GO HOME BY TAXI OR UBER OR ORTHERWISE, BUT AN ADULT MUST ACCOMPANY YOU HOME AND STAY WITH YOU FOR 24 HOURS.  Name and phone number of your driver:  Special Instructions: N/A              Please read over the following fact sheets you were  given: _____________________________________________________________________             [facility] - Preparing for Surgery Before surgery, you can play an important role.  Because skin is not sterile, your skin needs to be as free of germs as possible.  You can reduce the number of germs on your skin by washing with CHG (chlorahexidine gluconate) soap before surgery.  CHG is an antiseptic cleaner which kills germs and bonds with the skin to continue killing germs even after washing. Please DO NOT use if you have an allergy to CHG or antibacterial soaps.  If your skin becomes reddened/irritated stop using the CHG and inform your nurse when you arrive at Short Stay.  You may shave your face/neck. Please follow these instructions carefully:  1.  Shower with CHG Soap the night before surgery and the  morning of Surgery.  2.  If you choose to wash your hair, wash your hair first as usual with your  normal  shampoo.  3.  After you shampoo, rinse your hair and body thoroughly to remove the  shampoo.                            4.  Use CHG as you would any other liquid soap.  You can apply chg directly  to the skin and wash                       Gently with a scrungie or clean washcloth.  5.  Apply the CHG Soap to your body ONLY FROM THE NECK DOWN.   Do not use on face/ open                           Wound or open sores. Avoid contact with eyes, ears mouth and genitals (private parts).                       Wash face,  Genitals (private parts) with your normal soap.             6.  Wash thoroughly, paying special attention to the area where your surgery  will be performed.  7.  Thoroughly rinse your body with warm water from the neck  down.  8.  DO NOT shower/wash with your normal soap after using and rinsing off  the CHG Soap.                9.  Pat yourself dry with a clean towel.            10.  Wear clean pajamas.            11.  Place clean sheets on your bed the night of your first shower and do  not  sleep with pets. Day of Surgery : Do not apply any lotions/deodorants the morning of surgery.  Please wear clean clothes to the hospital/surgery center.  FAILURE TO FOLLOW THESE INSTRUCTIONS MAY RESULT IN THE CANCELLATION OF YOUR SURGERY PATIENT SIGNATURE_________________________________  NURSE SIGNATURE__________________________________  ________________________________________________________________________

## 2021-06-21 ENCOUNTER — Ambulatory Visit
Admission: RE | Admit: 2021-06-21 | Discharge: 2021-06-21 | Disposition: A | Discharge status: Home / Self Care | Payer: Medicare Other | Source: Ambulatory Visit | Attending: Neurological Surgery | Admitting: Neurological Surgery

## 2021-06-21 ENCOUNTER — Other Ambulatory Visit: Payer: Self-pay

## 2021-06-21 DIAGNOSIS — N200 Calculus of kidney: Secondary | ICD-10-CM | POA: Diagnosis not present

## 2021-06-21 DIAGNOSIS — S22079A Unspecified fracture of T9-T10 vertebra, initial encounter for closed fracture: Secondary | ICD-10-CM | POA: Diagnosis not present

## 2021-06-21 DIAGNOSIS — N1330 Unspecified hydronephrosis: Secondary | ICD-10-CM | POA: Diagnosis not present

## 2021-06-21 DIAGNOSIS — S22078A Other fracture of T9-T10 vertebra, initial encounter for closed fracture: Secondary | ICD-10-CM

## 2021-06-21 DIAGNOSIS — M40204 Unspecified kyphosis, thoracic region: Secondary | ICD-10-CM | POA: Diagnosis not present

## 2021-06-24 ENCOUNTER — Encounter (HOSPITAL_COMMUNITY): Payer: Self-pay

## 2021-06-24 ENCOUNTER — Encounter (HOSPITAL_COMMUNITY)
Admission: RE | Admit: 2021-06-24 | Discharge: 2021-06-24 | Disposition: A | Discharge status: Home / Self Care | Payer: Medicare Other | Source: Ambulatory Visit | Attending: Urology | Admitting: Urology

## 2021-06-24 ENCOUNTER — Other Ambulatory Visit: Payer: Self-pay

## 2021-06-24 VITALS — Ht 71.0 in | Wt 250.0 lb

## 2021-06-24 DIAGNOSIS — N211 Calculus in urethra: Secondary | ICD-10-CM | POA: Diagnosis not present

## 2021-06-24 DIAGNOSIS — Z01818 Encounter for other preprocedural examination: Secondary | ICD-10-CM | POA: Diagnosis not present

## 2021-06-24 DIAGNOSIS — Z7901 Long term (current) use of anticoagulants: Secondary | ICD-10-CM | POA: Diagnosis not present

## 2021-06-24 DIAGNOSIS — I110 Hypertensive heart disease with heart failure: Secondary | ICD-10-CM | POA: Diagnosis not present

## 2021-06-24 DIAGNOSIS — Z7984 Long term (current) use of oral hypoglycemic drugs: Secondary | ICD-10-CM | POA: Diagnosis not present

## 2021-06-24 DIAGNOSIS — I447 Left bundle-branch block, unspecified: Secondary | ICD-10-CM | POA: Diagnosis not present

## 2021-06-24 DIAGNOSIS — E1142 Type 2 diabetes mellitus with diabetic polyneuropathy: Secondary | ICD-10-CM | POA: Insufficient documentation

## 2021-06-24 DIAGNOSIS — I509 Heart failure, unspecified: Secondary | ICD-10-CM | POA: Diagnosis not present

## 2021-06-24 DIAGNOSIS — Z86711 Personal history of pulmonary embolism: Secondary | ICD-10-CM | POA: Diagnosis not present

## 2021-06-24 DIAGNOSIS — Z79899 Other long term (current) drug therapy: Secondary | ICD-10-CM | POA: Diagnosis not present

## 2021-06-24 NOTE — Progress Notes (Signed)
COVID test- NA  PCP - Dr. Threasa Beards Cardiologist - no  Chest x-ray - no EKG - 06/25/21-chart Stress Test - no ECHO - 05/29/21-epic Cardiac Cath - no Pacemaker/ICD device last checked:NA  Sleep Study - yes CPAP - yes  Fasting Blood Sugar - 130-175 Checks Blood Sugar _____ times a day. 1/week  Blood Thinner Instructions:Xarelto/ Dr. Sharlett Iles fpr DVT, PE Aspirin Instructions:stop 2 days prior to DOS Last Dose:06/29/21  Anesthesia review: yes  Patient denies shortness of breath, fever, cough and chest pain at PAT appointment Pt had a CT on his back as a F/U to a fracture at T-9 last week and will call Dr. Annita Brod office.  Patient verbalized understanding of instructions that were given to them at the PAT appointment. Patient was also instructed that they will need to review over the PAT instructions again at home before surgery. Yes. Pt 's PAT visit was over the phone. Lab only and pick up od soap and instructions on 06/25/21.

## 2021-06-25 ENCOUNTER — Encounter (HOSPITAL_COMMUNITY)
Admission: RE | Admit: 2021-06-25 | Discharge: 2021-06-25 | Disposition: A | Discharge status: Home / Self Care | Payer: Medicare Other | Source: Ambulatory Visit | Attending: Urology | Admitting: Urology

## 2021-06-25 DIAGNOSIS — E1142 Type 2 diabetes mellitus with diabetic polyneuropathy: Secondary | ICD-10-CM | POA: Insufficient documentation

## 2021-06-25 DIAGNOSIS — Z01818 Encounter for other preprocedural examination: Secondary | ICD-10-CM | POA: Insufficient documentation

## 2021-06-25 LAB — BASIC METABOLIC PANEL
Anion gap: 6 (ref 5–15)
BUN: 20 mg/dL (ref 8–23)
CO2: 26 mmol/L (ref 22–32)
Calcium: 9.4 mg/dL (ref 8.9–10.3)
Chloride: 111 mmol/L (ref 98–111)
Creatinine, Ser: 1.09 mg/dL (ref 0.61–1.24)
GFR, Estimated: >60 mL/min (ref >60)
Glucose, Bld: 104 mg/dL — ABNORMAL HIGH (ref 70–99)
Potassium: 4.1 mmol/L (ref 3.5–5.1)
Sodium: 143 mmol/L (ref 135–145)

## 2021-06-25 LAB — CBC
HCT: 39.9 % (ref 39.0–52.0)
Hemoglobin: 12.7 g/dL — ABNORMAL LOW (ref 13.0–17.0)
MCH: 29.7 pg (ref 26.0–34.0)
MCHC: 31.8 g/dL (ref 30.0–36.0)
MCV: 93.2 fL (ref 80.0–100.0)
Platelets: 227 10*3/uL (ref 150–400)
RBC: 4.28 MIL/uL (ref 4.22–5.81)
RDW: 14.1 % (ref 11.5–15.5)
WBC: 6.7 10*3/uL (ref 4.0–10.5)
nRBC: 0 % (ref 0.0–0.2)

## 2021-06-25 LAB — HEMOGLOBIN A1C
Hgb A1c MFr Bld: 7.4 % — ABNORMAL HIGH (ref 4.8–5.6)
Mean Plasma Glucose: 165.68 mg/dL

## 2021-06-25 LAB — GLUCOSE, CAPILLARY: Glucose-Capillary: 98 mg/dL (ref 70–99)

## 2021-06-26 DIAGNOSIS — S22078A Other fracture of T9-T10 vertebra, initial encounter for closed fracture: Secondary | ICD-10-CM | POA: Diagnosis not present

## 2021-06-26 DIAGNOSIS — Z6835 Body mass index (BMI) 35.0-35.9, adult: Secondary | ICD-10-CM | POA: Diagnosis not present

## 2021-06-26 NOTE — Progress Notes (Addendum)
Anesthesia Chart Review   Case: [id number] Date/Time: 07/02/21 1045   Procedure: CYSTOSCOPY/RETROGRADE/URETEROSCOPY/HOLMIUM LASER/STENT PLACEMENT (Left)   Anesthesia type: General   Pre-op diagnosis: LEFT URETERAL STONE   Location: [facility] / [facility]   Surgeons: Festus Aloe, MD       DISCUSSION:70 y.o. never smoker with h/o HTN, DM II (A1C 7.4), sleep apnea, PE (on Xarelto), LBBB chronic, CHF with EF 25-30% on Echo 05/28/2021, left ureteral stone scheduled for above procedure 07/02/2021 with Dr. Festus Aloe.   Pt advised to hold Xarelto 2 days prior to procedure.   Cardiac clearance requested with new onset LV systolic dysfunction, EF 01-00% on most recent Echo, no notes in Epic to review.   Addendum 07/01/2021:  Clearance received from Dr. Einar Gip which states, "Patient has new onset LV systolic dysfunction however not in acute decompensated heart failure, he is well compensated and was evaluated by me almost a year ago and has not had a follow-up.  However patient is completely asymptomatic without chest pain or dyspnea, no PND or orthopnea on telephone encounter today.   He has not had any recent hospitalization.  Patient is scheduled for a relatively low to intermediate risk procedure, and the patient was completely asymptomatic although LVEF has been reduced.  I would recommend that we proceed with the procedure and I will be available if he has any cardiac decompensation.   Overall surgical risk eval: Low to intermediate risk."  He will be seen by cardiology following surgical procedure. VS: Ht 5\' 11"  (1.803 m)   Wt 113.4 kg   BMI 34.87 kg/m   PROVIDERS: Leanna Battles, MD is PCP   Adrian Prows, MD is Cardiologist  LABS: Labs reviewed: Acceptable for surgery. (all labs ordered are listed, but only abnormal results are displayed)  Labs Reviewed - No data to display   IMAGES:   EKG: 06/25/21 Rate 64 bpm  Sinus rhythm with Fusion complexes Left axis  deviation  LBBB  CV: Echocardiogram 05/28/2021:  Left ventricle cavity is normal in size. Moderate concentric hypertrophy  of the left ventricle. Abnormal septal wall motion due to left bundle  branch block. Severe global hypokinesis. LVEF 25-30%. Doppler evidence of  grade I (impaired) diastolic dysfunction, normal LAP.  Bicuspid aortic valve raphe between noncoronary and left cusp. Mild  calcification. Mild aortic valve stenosis. Vmax 2.2 m/sec, mean PG 10  mmHg, AVA 1.7 cm by continuity equation No regurgitation.  Mild (Grade I) mitral regurgitation.  Mild tricuspid regurgitation.  No evidence of pulmonary hypertension.  Compared to previous study in 7121, LV systolic dysfunction is new.  Echo 04/08/2012 Study Conclusions   - Left ventricle: Technically very limited study. Overall LV    function is probably normal. The cavity size was normal.    Wall thickness was increased in a pattern of mild LVH.  - Aortic valve: Sclerosis without stenosis.  - Right ventricle: The RV can not be assessed from this    study. Poorly visualized.  Past Medical History:  Diagnosis Date   Arthritis    Diabetes mellitus    Hx pulmonary embolism 2009   required emergent tPA treatment   Hypertension    Shortness of breath    Sleep apnea     Past Surgical History:  Procedure Laterality Date   BACK SURGERY     COLONOSCOPY  2009   adenoma polyp   ELBOW ARTHROSCOPY  2010   IR RADIOLOGIST EVAL & MGMT  12/25/2020   IR RADIOLOGIST EVAL &  MGMT  12/28/2020   LUMBAR PERCUTANEOUS PEDICLE SCREW 3 LEVEL N/A 05/25/2013   Procedure: Posterior Percutaneous Fixation from Thoracic nine to Lumbar one vertebrae with arthrodesis of Thoracic eleven Fracture;  Surgeon: Kristeen Miss, MD;  Location: [facility];  Service: Neurosurgery;  Laterality: N/A;  Posterior Percutaneous Fixation from Thoracic nine to Lumbar one vertebrae with arthrodesis of Thoracic eleven Fracture    MEDICATIONS:  acetaminophen (TYLENOL) 500  MG tablet   Ascorbic Acid (VITAMIN C) 1000 MG tablet   atorvastatin (LIPITOR) 20 MG tablet   Cholecalciferol (VITAMIN D) 50 MCG (2000 UT) CAPS   dapagliflozin propanediol (FARXIGA) 10 MG TABS tablet   glipiZIDE (GLUCOTROL XL) 2.5 MG 24 hr tablet   LEVEMIR FLEXTOUCH 100 UNIT/ML FlexPen   losartan (COZAAR) 50 MG tablet   metFORMIN (GLUCOPHAGE) 500 MG tablet   metoprolol succinate (TOPROL-XL) 25 MG 24 hr tablet   Multiple Vitamin (MULTIVITAMIN WITH MINERALS) TABS tablet   Omega-3 Fatty Acids (FISH OIL) 1000 MG CAPS   Rivaroxaban (XARELTO) 20 MG TABS tablet   No current facility-administered medications for this encounter.    Anthony Felix Ward, PA-C [facility] [phone]

## 2021-06-28 ENCOUNTER — Telehealth: Payer: Self-pay | Admitting: Cardiology

## 2021-06-28 ENCOUNTER — Encounter: Payer: Self-pay | Admitting: Cardiology

## 2021-06-28 NOTE — Telephone Encounter (Signed)
Patient scheduled for CYSTOSCOPY/RETROGRADE/URETEROSCOPY/HOLMIUM LASER/STENT PLACEMENT By Dr. Junious Silk on 07/02/2021.    Patient has new onset LV systolic dysfunction however not in acute decompensated heart failure, he is well compensated and was evaluated by me almost a year ago and has not had a follow-up.  However patient is completely asymptomatic without chest pain or dyspnea, no PND or orthopnea on telephone encounter today.  He has not had any recent hospitalization.  Patient is scheduled for a relatively low to intermediate risk procedure, and the patient was completely asymptomatic although LVEF has been reduced.  I would recommend that we proceed with the procedure and I will be available if he has any cardiac decompensation.  Overall surgical risk eval: Low to intermediate risk.  Fax: [phone] Attn Coni (Dr. Junious Silk)   Adrian Prows, MD, [facility] 06/28/2021, 2:24 PM Office: [phone] Fax: [phone] Pager: [phone]

## 2021-06-28 NOTE — Telephone Encounter (Signed)
I will send surgical clearance letter to Dr. Junious Silk.  However we will schedule an office visit with [place].

## 2021-06-28 NOTE — Telephone Encounter (Signed)
Patient had abnormal findings from echo; anesthesia team for his upcoming surgery say they will not proceed with the surgery scheduled for 11/8 unless Dr. Einar Gip clears him for surgery. Please advise, and look out for an incoming fax in regards to this.

## 2021-07-01 NOTE — Anesthesia Preprocedure Evaluation (Addendum)
Anesthesia Evaluation  Patient identified by MRN, date of birth, ID band Patient awake    Reviewed: Allergy & Precautions, NPO status , Patient's Chart, lab work & pertinent test results  Airway Mallampati: III  TM Distance: >3 FB Neck ROM: Limited    Dental no notable dental hx.    Pulmonary sleep apnea ,    Pulmonary exam normal breath sounds clear to auscultation       Cardiovascular hypertension,  Rhythm:Regular Rate:Bradycardia  Left ventricle cavity is normal in size. Moderate concentric hypertrophy  of the left ventricle. Abnormal septal wall motion due to left bundle  branch block. Severe global hypokinesis. LVEF 25-30%. Doppler evidence of  grade I (impaired) diastolic dysfunction, normal LAP.  Bicuspid aortic valve raphe between noncoronary and left cusp. Mild  calcification. Mild aortic valve stenosis. Vmax 2.2 m/sec, mean PG 10  mmHg, AVA 1.7 cm by continuity equation No regurgitation.  Mild (Grade I) mitral regurgitation.  Mild tricuspid regurgitation.  No evidence of pulmonary hypertension.  Compared to previous study in 4174, LV systolic dysfunction is new.    Neuro/Psych negative neurological ROS  negative psych ROS   GI/Hepatic negative GI ROS, Neg liver ROS,   Endo/Other  diabetes  Renal/GU negative Renal ROS  negative genitourinary   Musculoskeletal  (+) Arthritis , Ankylosing Spondylitis   Abdominal   Peds negative pediatric ROS (+)  Hematology negative hematology ROS (+)   Anesthesia Other Findings   Reproductive/Obstetrics negative OB ROS                           Anesthesia Physical Anesthesia Plan  ASA: 3  Anesthesia Plan: General   Post-op Pain Management:    Induction: Intravenous  PONV Risk Score and Plan: 2 and Ondansetron, Dexamethasone and Treatment may vary due to age or medical condition  Airway Management Planned: LMA  Additional Equipment:    Intra-op Plan:   Post-operative Plan: Extubation in OR  Informed Consent: I have reviewed the patients History and Physical, chart, labs and discussed the procedure including the risks, benefits and alternatives for the proposed anesthesia with the patient or authorized representative who has indicated his/her understanding and acceptance.     Dental advisory given  Plan Discussed with: CRNA and Surgeon  Anesthesia Plan Comments: (See PAT note 06/24/2021, Konrad Felix Ward, PA-C)       Anesthesia Quick Evaluation

## 2021-07-02 ENCOUNTER — Ambulatory Visit (HOSPITAL_COMMUNITY): Payer: Medicare Other | Admitting: Physician Assistant

## 2021-07-02 ENCOUNTER — Ambulatory Visit (HOSPITAL_COMMUNITY): Payer: Medicare Other | Admitting: Registered Nurse

## 2021-07-02 ENCOUNTER — Encounter (HOSPITAL_COMMUNITY): Payer: Self-pay | Admitting: Urology

## 2021-07-02 ENCOUNTER — Encounter (HOSPITAL_COMMUNITY)
Admission: RE | Disposition: A | Discharge status: Home / Self Care | Payer: Self-pay | Source: Ambulatory Visit | Attending: Urology

## 2021-07-02 ENCOUNTER — Ambulatory Visit (HOSPITAL_COMMUNITY)
Admission: RE | Admit: 2021-07-02 | Discharge: 2021-07-02 | Disposition: A | Discharge status: Home / Self Care | Payer: Medicare Other | Source: Ambulatory Visit | Attending: Urology | Admitting: Urology

## 2021-07-02 ENCOUNTER — Ambulatory Visit (HOSPITAL_COMMUNITY): Payer: Medicare Other

## 2021-07-02 DIAGNOSIS — I10 Essential (primary) hypertension: Secondary | ICD-10-CM | POA: Insufficient documentation

## 2021-07-02 DIAGNOSIS — N132 Hydronephrosis with renal and ureteral calculous obstruction: Secondary | ICD-10-CM | POA: Diagnosis not present

## 2021-07-02 DIAGNOSIS — Z87442 Personal history of urinary calculi: Secondary | ICD-10-CM | POA: Diagnosis not present

## 2021-07-02 DIAGNOSIS — E119 Type 2 diabetes mellitus without complications: Secondary | ICD-10-CM | POA: Diagnosis not present

## 2021-07-02 DIAGNOSIS — N201 Calculus of ureter: Secondary | ICD-10-CM

## 2021-07-02 DIAGNOSIS — E785 Hyperlipidemia, unspecified: Secondary | ICD-10-CM | POA: Diagnosis not present

## 2021-07-02 DIAGNOSIS — G4733 Obstructive sleep apnea (adult) (pediatric): Secondary | ICD-10-CM | POA: Diagnosis not present

## 2021-07-02 DIAGNOSIS — N210 Calculus in bladder: Secondary | ICD-10-CM | POA: Diagnosis not present

## 2021-07-02 DIAGNOSIS — G4730 Sleep apnea, unspecified: Secondary | ICD-10-CM | POA: Insufficient documentation

## 2021-07-02 HISTORY — PX: CYSTOSCOPY/URETEROSCOPY/HOLMIUM LASER/STENT PLACEMENT: SHX6546

## 2021-07-02 LAB — GLUCOSE, CAPILLARY
Glucose-Capillary: 97 mg/dL (ref 70–99)
Glucose-Capillary: 81 mg/dL (ref 70–99)

## 2021-07-02 SURGERY — CYSTOSCOPY/URETEROSCOPY/HOLMIUM LASER/STENT PLACEMENT
Anesthesia: General | Laterality: Left

## 2021-07-02 MED ORDER — MIDAZOLAM HCL 5 MG/5ML IJ SOLN
INTRAMUSCULAR | Status: DC | PRN
  Administered 2021-07-02: 1 mg via INTRAVENOUS

## 2021-07-02 MED ORDER — ONDANSETRON HCL 4 MG/2ML IJ SOLN
INTRAMUSCULAR | Status: AC
  Filled 2021-07-02: qty 2

## 2021-07-02 MED ORDER — PROPOFOL 10 MG/ML IV BOLUS
INTRAVENOUS | Status: DC | PRN
  Administered 2021-07-02: 180 mg via INTRAVENOUS

## 2021-07-02 MED ORDER — FENTANYL CITRATE (PF) 100 MCG/2ML IJ SOLN
INTRAMUSCULAR | Status: DC | PRN
  Administered 2021-07-02: 75 ug via INTRAVENOUS
  Administered 2021-07-02: 25 ug via INTRAVENOUS

## 2021-07-02 MED ORDER — FENTANYL CITRATE (PF) 250 MCG/5ML IJ SOLN
INTRAMUSCULAR | Status: AC
  Filled 2021-07-02: qty 5

## 2021-07-02 MED ORDER — CEFAZOLIN SODIUM-DEXTROSE 2-4 GM/100ML-% IV SOLN
2.0000 g | Freq: Once | INTRAVENOUS | Status: AC
  Administered 2021-07-02: 2 g via INTRAVENOUS
  Filled 2021-07-02: qty 100

## 2021-07-02 MED ORDER — LIDOCAINE HCL URETHRAL/MUCOSAL 2 % EX GEL
CUTANEOUS | Status: AC
  Filled 2021-07-02: qty 30

## 2021-07-02 MED ORDER — PHENYLEPHRINE HCL (PRESSORS) 10 MG/ML IV SOLN
INTRAVENOUS | Status: DC | PRN
  Administered 2021-07-02: 120 ug via INTRAVENOUS

## 2021-07-02 MED ORDER — OXYCODONE HCL 5 MG PO TABS
ORAL_TABLET | ORAL | Status: AC
  Filled 2021-07-02: qty 1

## 2021-07-02 MED ORDER — LIDOCAINE 2% (20 MG/ML) 5 ML SYRINGE
INTRAMUSCULAR | Status: DC | PRN
  Administered 2021-07-02 (×2): 50 mg via INTRAVENOUS

## 2021-07-02 MED ORDER — DEXAMETHASONE SODIUM PHOSPHATE 10 MG/ML IJ SOLN
INTRAMUSCULAR | Status: AC
  Filled 2021-07-02: qty 1

## 2021-07-02 MED ORDER — FENTANYL CITRATE PF 50 MCG/ML IJ SOSY
25.0000 ug | PREFILLED_SYRINGE | INTRAMUSCULAR | Status: DC | PRN
  Administered 2021-07-02: 25 ug via INTRAVENOUS

## 2021-07-02 MED ORDER — PROPOFOL 10 MG/ML IV BOLUS
INTRAVENOUS | Status: AC
  Filled 2021-07-02: qty 20

## 2021-07-02 MED ORDER — IOHEXOL 300 MG/ML  SOLN
INTRAMUSCULAR | Status: DC | PRN
  Administered 2021-07-02: 5 mL via URETHRAL

## 2021-07-02 MED ORDER — PHENYLEPHRINE HCL (PRESSORS) 10 MG/ML IV SOLN
INTRAVENOUS | Status: AC
  Filled 2021-07-02: qty 2

## 2021-07-02 MED ORDER — MIDAZOLAM HCL 2 MG/2ML IJ SOLN
INTRAMUSCULAR | Status: AC
  Filled 2021-07-02: qty 2

## 2021-07-02 MED ORDER — OXYCODONE HCL 5 MG PO TABS
5.0000 mg | ORAL_TABLET | Freq: Once | ORAL | Status: AC | PRN
  Administered 2021-07-02: 5 mg via ORAL

## 2021-07-02 MED ORDER — FENTANYL CITRATE PF 50 MCG/ML IJ SOSY
PREFILLED_SYRINGE | INTRAMUSCULAR | Status: AC
  Administered 2021-07-02: 25 ug via INTRAVENOUS
  Filled 2021-07-02: qty 2

## 2021-07-02 MED ORDER — LACTATED RINGERS IV SOLN
INTRAVENOUS | Status: DC
Start: 2021-07-02 — End: 2021-07-02

## 2021-07-02 MED ORDER — ACETAMINOPHEN 10 MG/ML IV SOLN: 1000.0000 mg | Freq: Once | INTRAVENOUS | Status: DC | PRN

## 2021-07-02 MED ORDER — OXYCODONE HCL 5 MG/5ML PO SOLN: 5.0000 mg | Freq: Once | ORAL | Status: AC | PRN

## 2021-07-02 MED ORDER — ACETAMINOPHEN 10 MG/ML IV SOLN
INTRAVENOUS | Status: AC
  Administered 2021-07-02: 1000 mg via INTRAVENOUS
  Filled 2021-07-02: qty 100

## 2021-07-02 MED ORDER — DEXAMETHASONE SODIUM PHOSPHATE 10 MG/ML IJ SOLN
INTRAMUSCULAR | Status: DC | PRN
  Administered 2021-07-02: 10 mg via INTRAVENOUS

## 2021-07-02 MED ORDER — PHENYLEPHRINE HCL-NACL 20-0.9 MG/250ML-% IV SOLN
INTRAVENOUS | Status: DC | PRN
  Administered 2021-07-02: 25 ug/min via INTRAVENOUS

## 2021-07-02 MED ORDER — ONDANSETRON HCL 4 MG/2ML IJ SOLN: 4.0000 mg | Freq: Once | INTRAMUSCULAR | Status: DC | PRN

## 2021-07-02 MED ORDER — ONDANSETRON HCL 4 MG/2ML IJ SOLN
INTRAMUSCULAR | Status: DC | PRN
  Administered 2021-07-02: 4 mg via INTRAVENOUS

## 2021-07-02 MED ORDER — CHLORHEXIDINE GLUCONATE 0.12 % MT SOLN
15.0000 mL | Freq: Once | OROMUCOSAL | Status: AC
  Administered 2021-07-02: 15 mL via OROMUCOSAL

## 2021-07-02 MED ORDER — ORAL CARE MOUTH RINSE: 15.0000 mL | Freq: Once | OROMUCOSAL | Status: AC

## 2021-07-02 MED ORDER — SODIUM CHLORIDE 0.9 % IR SOLN
Status: DC | PRN
  Administered 2021-07-02: 3000 mL

## 2021-07-02 MED ORDER — PHENYLEPHRINE 40 MCG/ML (10ML) SYRINGE FOR IV PUSH (FOR BLOOD PRESSURE SUPPORT)
PREFILLED_SYRINGE | INTRAVENOUS | Status: AC
  Filled 2021-07-02: qty 10

## 2021-07-02 SURGICAL SUPPLY — 23 items
BAG URO CATCHER STRL LF (MISCELLANEOUS) ×3 IMPLANT
BASKET ZERO TIP NITINOL 2.4FR (BASKET) ×3 IMPLANT
CATH URET 5FR 28IN CONE TIP (BALLOONS)
CATH URET 5FR 70CM CONE TIP (BALLOONS) IMPLANT
CATH URETL OPEN END 6FR 70 (CATHETERS) ×3 IMPLANT
CLOTH BEACON ORANGE TIMEOUT ST (SAFETY) ×3 IMPLANT
GLOVE SURG ENC MOIS LTX SZ7.5 (GLOVE) ×3 IMPLANT
GOWN STRL REUS W/TWL XL LVL3 (GOWN DISPOSABLE) ×3 IMPLANT
GUIDEWIRE STR DUAL SENSOR (WIRE) ×3 IMPLANT
GUIDEWIRE ZIPWRE .038 STRAIGHT (WIRE) ×3 IMPLANT
KIT TURNOVER KIT A (KITS) IMPLANT
LASER FIB FLEXIVA PULSE ID 365 (Laser) IMPLANT
MANIFOLD NEPTUNE II (INSTRUMENTS) ×3 IMPLANT
PACK CYSTO (CUSTOM PROCEDURE TRAY) ×3 IMPLANT
SHEATH NAVIGATOR HD 11/13X28 (SHEATH) IMPLANT
SHEATH NAVIGATOR HD 11/13X36 (SHEATH) IMPLANT
SHEATH URETERAL 12FRX35CM (MISCELLANEOUS) ×3 IMPLANT
STENT CONTOUR 6FRX26X.038 (STENTS) ×3 IMPLANT
TRACTIP FLEXIVA PULS ID 200XHI (Laser) ×1 IMPLANT
TRACTIP FLEXIVA PULSE ID 200 (Laser) ×3
TUBING CONNECTING 10 (TUBING) ×2 IMPLANT
TUBING CONNECTING 10' (TUBING) ×1
TUBING UROLOGY SET (TUBING) ×3 IMPLANT

## 2021-07-02 NOTE — Anesthesia Procedure Notes (Addendum)
Procedure Name: LMA Insertion Date/Time: 07/02/2021 10:57 AM Performed by: Lissa Morales, CRNA Pre-anesthesia Checklist: Patient identified, Emergency Drugs available, Suction available and Patient being monitored Patient Re-evaluated:Patient Re-evaluated prior to induction Oxygen Delivery Method: Circle system utilized Preoxygenation: Pre-oxygenation with 100% oxygen Induction Type: IV induction Ventilation: Mask ventilation without difficulty LMA: LMA with gastric port inserted LMA Size: 5.0 Tube type: Oral Number of attempts: 1 Placement Confirmation: positive ETCO2 Tube secured with: Tape Dental Injury: Teeth and Oropharynx as per pre-operative assessment

## 2021-07-02 NOTE — Anesthesia Postprocedure Evaluation (Signed)
Anesthesia Post Note  Patient: Anthony Case  Procedure(s) Performed: CYSTOSCOPY/RETROGRADE/URETEROSCOPY/HOLMIUM LASER/STENT PLACEMENT (Left)     Patient location during evaluation: PACU Anesthesia Type: General Level of consciousness: awake and alert Pain management: pain level controlled Vital Signs Assessment: post-procedure vital signs reviewed and stable Respiratory status: spontaneous breathing, nonlabored ventilation, respiratory function stable and patient connected to nasal cannula oxygen Cardiovascular status: blood pressure returned to baseline and stable Postop Assessment: no apparent nausea or vomiting Anesthetic complications: no   No notable events documented.  Last Vitals:  Vitals:   07/02/21 1230 07/02/21 1245  BP: 131/78 (!) 100/52  Pulse: (!) 58 (!) 55  Resp: 14   Temp:    SpO2: 98% 96%    Last Pain:  Vitals:   07/02/21 1245  TempSrc:   PainSc: 3                  [name]

## 2021-07-02 NOTE — Discharge Instructions (Signed)
Removal of the stent - remove the stent on Friday morning, July 05, 2021 as instructed

## 2021-07-02 NOTE — Transfer of Care (Signed)
Immediate Anesthesia Transfer of Care Note  Patient: Anthony Case  Procedure(s) Performed: CYSTOSCOPY/RETROGRADE/URETEROSCOPY/HOLMIUM LASER/STENT PLACEMENT (Left)  Patient Location: PACU  Anesthesia Type:General  Level of Consciousness: awake, alert , oriented and patient cooperative  Airway & Oxygen Therapy: Patient Spontanous Breathing and Patient connected to face mask oxygen  Post-op Assessment: Report given to RN, Post -op Vital signs reviewed and stable and Patient moving all extremities X 4  Post vital signs: stable  Last Vitals:  Vitals Value Taken Time  BP 144/76 07/02/21 1208  Temp 36.5 C 07/02/21 1208  Pulse 61 07/02/21 1213  Resp 13 07/02/21 1208  SpO2 100 % 07/02/21 1213  Vitals shown include unvalidated device data.  Last Pain:  Vitals:   07/02/21 0918  TempSrc:   PainSc: 0-No pain         Complications: No notable events documented.

## 2021-07-02 NOTE — Op Note (Signed)
Preoperative diagnosis: Left ureteral stone, left hydronephrosis Postoperative diagnosis: Same  Procedure: Cystoscopy with left retrograde pyelogram, left ureteroscopy, laser lithotripsy, stone basket extraction, left ureteral stent placement  Surgeon: Junious Silk  Anesthesia: General  Indication for procedure: Anthony Case is a 70 year old male with a history of stones.  He had left flank pain and CT revealed a 5 to 7 mm left proximal stone.  He had not seen a stone pass and recent spinal MRI showed left hydro.  Findings: Cystoscopy of the urethra and bladder unremarkable.  There was a small yellow stone in the bladder.  Left retrograde pyelogram-this outlined a single ureter single collecting system unit with a filling defect in the mid ureter consistent with the stone and some mild dilation proximally.  On ureteroscopy the stone was located in the mid ureter and dusted and another stone was located in the left midpole calyx.  This was fragmented.  I found 2 or 3 fragments and collected those for stone analysis.  Description of procedure: After consent was obtained patient brought to the operating room.  After adequate anesthesia he was placed lithotomy position and prepped and draped in the usual sterile fashion.  Timeout was performed to confirm the patient and procedure.  Cystoscope was passed per urethra and the bladder inspected.  Left ureteral orifice cannulated with a 6 [place] open-ended catheter and left retrograde injection of contrast was performed.  I then advanced a sensor wire in the collecting system and backed the scope out.  I passed a dual channel semirigid ureteroscope adjacent to the wire located the stone in the mid ureter and a 243 m laser fiber was advanced.  The stone was dusted.  I then passed a second Glidewire under direct vision and backed out.  The access sheath was advanced over the sensor wire without difficulty.  Dual channel digital ureteroscope was advanced into the kidney  and the collecting system inspected.  The stone was located in the midpole and grasped.  It was disc shaped and would not quite make it into the sheath.  Therefore it was dropped at the entrance to the sheath and fragmented with the laser.  The fragments were removed and collected.  Also found a fragment of stone in the renal pelvis which was collected.  Further inspection of the collecting system and proximal ureter noted there to be no other stones or stone fragments.  The access sheath was backed out on the ureteroscope.  One last time the collecting system renal pelvis and ureter were inspected on the way out noted to have no stone fragments and no injury.  Wire was backloaded on the cystoscope and a 6 x 26 cm stent advanced.  The wire was removed with a good coil seen up in the kidney and a good coil in the bladder.  The bladder was drained and the scope removed.  The string was taped to the patient.  He was awakened taken the cover room in stable condition.  Complications: None  Blood loss: Minimal  Specimens: Stone fragments to office lab  Drains: 6 x 26 cm left ureteral stent with string  Disposition: Patient stable to PACU

## 2021-07-02 NOTE — H&P (Signed)
H&P  Chief Complaint: Left ureteral stone  History of Present Illness: Anthony Case is a 70 year old male with left flank pain.  He underwent CT scan of the abdomen and pelvis 05/23/2021 when a 7 mm stone was noted in the left proximal ureter.  There may have been a smaller 4 mm stone in the left mid ureter.  There were bilateral punctate ureteral stones.  He followed up on June 03, 2021 and a KUB was done and equivocal.  No stone was seen.  He had not seen a stone pass.  He was scheduled for ureteroscopy.  His urinalysis was negative.  He is doing well.  Occasional mild left flank pain.  He has not seen a stone pass.  He underwent MRI of the T-spine 06/23/2021 which showed mild to moderate left hydronephrosis.  He presents today for cystoscopy, left retrograde pyelogram, left ureteroscopy laser lithotripsy and stent placement.  He has been well without dysuria or gross hematuria.  No fever. He has not seen a stone pass.                         Past Medical History:  Diagnosis Date   Arthritis    Diabetes mellitus    Hx pulmonary embolism 2009   required emergent tPA treatment   Hypertension    Shortness of breath    Sleep apnea    Past Surgical History:  Procedure Laterality Date   BACK SURGERY     COLONOSCOPY  2009   adenoma polyp   ELBOW ARTHROSCOPY  2010   IR RADIOLOGIST EVAL & MGMT  12/25/2020   IR RADIOLOGIST EVAL & MGMT  12/28/2020   LUMBAR PERCUTANEOUS PEDICLE SCREW 3 LEVEL N/A 05/25/2013   Procedure: Posterior Percutaneous Fixation from Thoracic nine to Lumbar one vertebrae with arthrodesis of Thoracic eleven Fracture;  Surgeon: Kristeen Miss, MD;  Location: [facility];  Service: Neurosurgery;  Laterality: N/A;  Posterior Percutaneous Fixation from Thoracic nine to Lumbar one vertebrae with arthrodesis of Thoracic eleven Fracture    Home Medications:  No medications prior to admission.   Allergies:  Allergies  Allergen Reactions   Lisinopril Cough    Family History  Problem  Relation Age of Onset   Heart disease Brother    Schizophrenia Mother    Heart disease Father    Diabetes Father    Social History:  reports that he has never smoked. He has never used smokeless tobacco. He reports that he does not currently use alcohol. He reports that he does not use drugs.  ROS: A complete review of systems was performed.  All systems are negative except for pertinent findings as noted. Review of Systems  All other systems reviewed and are negative.   Physical Exam:  Vital signs in last 24 hours:   General:  Alert and oriented, No acute distress HEENT: Normocephalic, atraumatic Cardiovascular: Regular rate and rhythm Lungs: Regular rate and effort Abdomen: Soft, nontender, nondistended, no abdominal masses Extremities: No edema Neurologic: Grossly intact  Laboratory Data:  No results found for this or any previous visit (from the past 24 hour(s)). No results found for this or any previous visit (from the past 240 hour(s)). Creatinine: No results for input(s): CREATININE in the last 168 hours.  Impression/Assessment:  Left ureteral stone, left hydronephrosis -   Plan:  I discussed with the patient the nature, potential benefits, risks and alternatives to cystoscopy, left retrograde pyelogram, left ureteroscopy laser lithotripsy and stent placement,  including side effects of the proposed treatment, the likelihood of the patient achieving the goals of the procedure, and any potential problems that might occur during the procedure or recuperation.  We discussed he may have passed a stone.  He also may need a staged procedure with prestent  All questions answered. Patient elects to proceed.    Festus Aloe 07/02/2021

## 2021-07-03 ENCOUNTER — Encounter (HOSPITAL_COMMUNITY): Payer: Self-pay | Admitting: Urology

## 2021-07-23 ENCOUNTER — Encounter: Payer: Self-pay | Admitting: Podiatry

## 2021-07-23 DIAGNOSIS — E1140 Type 2 diabetes mellitus with diabetic neuropathy, unspecified: Secondary | ICD-10-CM | POA: Diagnosis not present

## 2021-07-23 DIAGNOSIS — I700 Atherosclerosis of aorta: Secondary | ICD-10-CM | POA: Diagnosis not present

## 2021-07-23 DIAGNOSIS — R2681 Unsteadiness on feet: Secondary | ICD-10-CM | POA: Diagnosis not present

## 2021-07-23 DIAGNOSIS — Q230 Congenital stenosis of aortic valve: Secondary | ICD-10-CM | POA: Diagnosis not present

## 2021-07-23 DIAGNOSIS — Z794 Long term (current) use of insulin: Secondary | ICD-10-CM | POA: Diagnosis not present

## 2021-07-23 DIAGNOSIS — I10 Essential (primary) hypertension: Secondary | ICD-10-CM | POA: Diagnosis not present

## 2021-07-23 DIAGNOSIS — G4733 Obstructive sleep apnea (adult) (pediatric): Secondary | ICD-10-CM | POA: Diagnosis not present

## 2021-07-23 DIAGNOSIS — E785 Hyperlipidemia, unspecified: Secondary | ICD-10-CM | POA: Diagnosis not present

## 2021-07-23 DIAGNOSIS — D6859 Other primary thrombophilia: Secondary | ICD-10-CM | POA: Diagnosis not present

## 2021-07-23 DIAGNOSIS — I420 Dilated cardiomyopathy: Secondary | ICD-10-CM | POA: Diagnosis not present

## 2021-07-23 DIAGNOSIS — Z7901 Long term (current) use of anticoagulants: Secondary | ICD-10-CM | POA: Diagnosis not present

## 2021-07-23 NOTE — Progress Notes (Signed)
This encounter was created in error - please disregard.

## 2021-07-24 ENCOUNTER — Encounter: Payer: Self-pay | Admitting: Cardiology

## 2021-07-24 ENCOUNTER — Ambulatory Visit: Payer: Medicare Other | Admitting: Cardiology

## 2021-07-24 ENCOUNTER — Other Ambulatory Visit: Payer: Self-pay

## 2021-07-24 VITALS — BP 107/64 | HR 73 | Temp 97.8°F | Resp 16 | Ht 71.0 in | Wt 252.0 lb

## 2021-07-24 DIAGNOSIS — I428 Other cardiomyopathies: Secondary | ICD-10-CM | POA: Diagnosis not present

## 2021-07-24 DIAGNOSIS — Z794 Long term (current) use of insulin: Secondary | ICD-10-CM

## 2021-07-24 DIAGNOSIS — Q231 Congenital insufficiency of aortic valve: Secondary | ICD-10-CM | POA: Diagnosis not present

## 2021-07-24 DIAGNOSIS — E782 Mixed hyperlipidemia: Secondary | ICD-10-CM

## 2021-07-24 DIAGNOSIS — I10 Essential (primary) hypertension: Secondary | ICD-10-CM

## 2021-07-24 DIAGNOSIS — I447 Left bundle-branch block, unspecified: Secondary | ICD-10-CM | POA: Diagnosis not present

## 2021-07-24 DIAGNOSIS — E1165 Type 2 diabetes mellitus with hyperglycemia: Secondary | ICD-10-CM | POA: Diagnosis not present
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
## 2021-07-24 NOTE — Patient Instructions (Signed)
He will get lab works done at [facility] on [address].

## 2021-08-01 DIAGNOSIS — N201 Calculus of ureter: Secondary | ICD-10-CM | POA: Diagnosis not present

## 2021-08-13 DIAGNOSIS — Z23 Encounter for immunization: Secondary | ICD-10-CM | POA: Diagnosis not present

## 2021-08-27 ENCOUNTER — Ambulatory Visit (HOSPITAL_COMMUNITY)
Admission: RE | Admit: 2021-08-27 | Discharge: 2021-08-27 | Disposition: A | Discharge status: Home / Self Care | Payer: Medicare Other | Attending: Cardiology | Admitting: Cardiology

## 2021-08-27 ENCOUNTER — Encounter (HOSPITAL_COMMUNITY)
Admission: RE | Disposition: A | Discharge status: Home / Self Care | Payer: Self-pay | Source: Home / Self Care | Attending: Cardiology

## 2021-08-27 ENCOUNTER — Other Ambulatory Visit: Payer: Self-pay

## 2021-08-27 DIAGNOSIS — I428 Other cardiomyopathies: Secondary | ICD-10-CM

## 2021-08-27 DIAGNOSIS — R0602 Shortness of breath: Secondary | ICD-10-CM | POA: Diagnosis present

## 2021-08-27 DIAGNOSIS — I420 Dilated cardiomyopathy: Secondary | ICD-10-CM | POA: Insufficient documentation

## 2021-08-27 DIAGNOSIS — I5022 Chronic systolic (congestive) heart failure: Secondary | ICD-10-CM | POA: Diagnosis present

## 2021-08-27 DIAGNOSIS — E1151 Type 2 diabetes mellitus with diabetic peripheral angiopathy without gangrene: Secondary | ICD-10-CM | POA: Diagnosis not present

## 2021-08-27 DIAGNOSIS — I2510 Atherosclerotic heart disease of native coronary artery without angina pectoris: Secondary | ICD-10-CM | POA: Insufficient documentation

## 2021-08-27 DIAGNOSIS — I119 Hypertensive heart disease without heart failure: Secondary | ICD-10-CM | POA: Insufficient documentation

## 2021-08-27 DIAGNOSIS — Z7901 Long term (current) use of anticoagulants: Secondary | ICD-10-CM | POA: Insufficient documentation

## 2021-08-27 DIAGNOSIS — Q231 Congenital insufficiency of aortic valve: Secondary | ICD-10-CM | POA: Diagnosis not present

## 2021-08-27 DIAGNOSIS — I447 Left bundle-branch block, unspecified: Secondary | ICD-10-CM | POA: Diagnosis present

## 2021-08-27 DIAGNOSIS — E1165 Type 2 diabetes mellitus with hyperglycemia: Secondary | ICD-10-CM | POA: Diagnosis not present

## 2021-08-27 DIAGNOSIS — E785 Hyperlipidemia, unspecified: Secondary | ICD-10-CM | POA: Insufficient documentation

## 2021-08-27 DIAGNOSIS — Z79899 Other long term (current) drug therapy: Secondary | ICD-10-CM | POA: Insufficient documentation

## 2021-08-27 DIAGNOSIS — I5020 Unspecified systolic (congestive) heart failure: Secondary | ICD-10-CM | POA: Diagnosis present

## 2021-08-27 HISTORY — PX: RIGHT/LEFT HEART CATH AND CORONARY ANGIOGRAPHY: CATH118266

## 2021-08-27 LAB — CBC
HCT: 42.9 % (ref 39.0–52.0)
Hemoglobin: 14.5 g/dL (ref 13.0–17.0)
MCH: 30.1 pg (ref 26.0–34.0)
MCHC: 33.8 g/dL (ref 30.0–36.0)
MCV: 89.2 fL (ref 80.0–100.0)
Platelets: 231 10*3/uL (ref 150–400)
RBC: 4.81 MIL/uL (ref 4.22–5.81)
RDW: 13.7 % (ref 11.5–15.5)
WBC: 6.7 10*3/uL (ref 4.0–10.5)
nRBC: 0 % (ref 0.0–0.2)

## 2021-08-27 LAB — POCT I-STAT 7, (LYTES, BLD GAS, ICA,H+H)
Acid-base deficit: 1 mmol/L (ref 0.0–2.0)
Bicarbonate: 23.5 mmol/L (ref 20.0–28.0)
Calcium, Ion: 1.32 mmol/L (ref 1.15–1.40)
HCT: 37 % — ABNORMAL LOW (ref 39.0–52.0)
Hemoglobin: 12.6 g/dL — ABNORMAL LOW (ref 13.0–17.0)
O2 Saturation: 97 %
Potassium: 4 mmol/L (ref 3.5–5.1)
Sodium: 142 mmol/L (ref 135–145)
TCO2: 25 mmol/L (ref 22–32)
pCO2 arterial: 39 mmHg (ref 32.0–48.0)
pH, Arterial: 7.388 (ref 7.350–7.450)
pO2, Arterial: 88 mmHg (ref 83.0–108.0)

## 2021-08-27 LAB — BASIC METABOLIC PANEL
Anion gap: 9 (ref 5–15)
BUN: 18 mg/dL (ref 8–23)
CO2: 22 mmol/L (ref 22–32)
Calcium: 9.5 mg/dL (ref 8.9–10.3)
Chloride: 108 mmol/L (ref 98–111)
Creatinine, Ser: 1.09 mg/dL (ref 0.61–1.24)
GFR, Estimated: >60 mL/min (ref >60)
Glucose, Bld: 193 mg/dL — ABNORMAL HIGH (ref 70–99)
Potassium: 4.4 mmol/L (ref 3.5–5.1)
Sodium: 139 mmol/L (ref 135–145)

## 2021-08-27 LAB — POCT I-STAT EG7
Acid-base deficit: 1 mmol/L (ref 0.0–2.0)
Acid-base deficit: 1 mmol/L (ref 0.0–2.0)
Acid-base deficit: 2 mmol/L (ref 0.0–2.0)
Acid-base deficit: 2 mmol/L (ref 0.0–2.0)
Bicarbonate: 23.5 mmol/L (ref 20.0–28.0)
Bicarbonate: 23.5 mmol/L (ref 20.0–28.0)
Bicarbonate: 24.5 mmol/L (ref 20.0–28.0)
Bicarbonate: 24.5 mmol/L (ref 20.0–28.0)
Calcium, Ion: 1.16 mmol/L (ref 1.15–1.40)
Calcium, Ion: 1.18 mmol/L (ref 1.15–1.40)
Calcium, Ion: 1.35 mmol/L (ref 1.15–1.40)
Calcium, Ion: 1.36 mmol/L (ref 1.15–1.40)
HCT: 36 % — ABNORMAL LOW (ref 39.0–52.0)
HCT: 36 % — ABNORMAL LOW (ref 39.0–52.0)
HCT: 37 % — ABNORMAL LOW (ref 39.0–52.0)
HCT: 37 % — ABNORMAL LOW (ref 39.0–52.0)
Hemoglobin: 12.2 g/dL — ABNORMAL LOW (ref 13.0–17.0)
Hemoglobin: 12.2 g/dL — ABNORMAL LOW (ref 13.0–17.0)
Hemoglobin: 12.6 g/dL — ABNORMAL LOW (ref 13.0–17.0)
Hemoglobin: 12.6 g/dL — ABNORMAL LOW (ref 13.0–17.0)
O2 Saturation: 67 %
O2 Saturation: 68 %
O2 Saturation: 69 %
O2 Saturation: 69 %
Potassium: 3.7 mmol/L (ref 3.5–5.1)
Potassium: 3.7 mmol/L (ref 3.5–5.1)
Potassium: 4 mmol/L (ref 3.5–5.1)
Potassium: 4 mmol/L (ref 3.5–5.1)
Sodium: 143 mmol/L (ref 135–145)
Sodium: 144 mmol/L (ref 135–145)
Sodium: 145 mmol/L (ref 135–145)
Sodium: 145 mmol/L (ref 135–145)
TCO2: 25 mmol/L (ref 22–32)
TCO2: 25 mmol/L (ref 22–32)
TCO2: 26 mmol/L (ref 22–32)
TCO2: 26 mmol/L (ref 22–32)
pCO2, Ven: 40.1 mmHg — ABNORMAL LOW (ref 44.0–60.0)
pCO2, Ven: 40.3 mmHg — ABNORMAL LOW (ref 44.0–60.0)
pCO2, Ven: 40.7 mmHg — ABNORMAL LOW (ref 44.0–60.0)
pCO2, Ven: 41 mmHg — ABNORMAL LOW (ref 44.0–60.0)
pH, Ven: 7.373 (ref 7.250–7.430)
pH, Ven: 7.376 (ref 7.250–7.430)
pH, Ven: 7.385 (ref 7.250–7.430)
pH, Ven: 7.388 (ref 7.250–7.430)
pO2, Ven: 36 mmHg (ref 32.0–45.0)
pO2, Ven: 36 mmHg (ref 32.0–45.0)
pO2, Ven: 36 mmHg (ref 32.0–45.0)
pO2, Ven: 37 mmHg (ref 32.0–45.0)

## 2021-08-27 LAB — GLUCOSE, CAPILLARY
Glucose-Capillary: 195 mg/dL — ABNORMAL HIGH (ref 70–99)
Glucose-Capillary: 92 mg/dL (ref 70–99)

## 2021-08-27 SURGERY — RIGHT/LEFT HEART CATH AND CORONARY ANGIOGRAPHY
Anesthesia: LOCAL

## 2021-08-27 MED ORDER — ACETAMINOPHEN 325 MG PO TABS: 650.0000 mg | ORAL_TABLET | ORAL | Status: DC | PRN

## 2021-08-27 MED ORDER — VERAPAMIL HCL 2.5 MG/ML IV SOLN
INTRAVENOUS | Status: DC | PRN
  Administered 2021-08-27: 4 mL via INTRA_ARTERIAL

## 2021-08-27 MED ORDER — ONDANSETRON HCL 4 MG/2ML IJ SOLN: 4.0000 mg | Freq: Four times a day (QID) | INTRAMUSCULAR | Status: DC | PRN

## 2021-08-27 MED ORDER — SODIUM CHLORIDE 0.9% FLUSH: 3.0000 mL | Freq: Two times a day (BID) | INTRAVENOUS | Status: DC

## 2021-08-27 MED ORDER — SODIUM CHLORIDE 0.9% FLUSH: 3.0000 mL | INTRAVENOUS | Status: DC | PRN

## 2021-08-27 MED ORDER — HEPARIN SODIUM (PORCINE) 1000 UNIT/ML IJ SOLN
INTRAMUSCULAR | Status: DC | PRN
  Administered 2021-08-27: 4000 [IU] via INTRAVENOUS

## 2021-08-27 MED ORDER — HEPARIN SODIUM (PORCINE) 1000 UNIT/ML IJ SOLN
INTRAMUSCULAR | Status: AC
  Filled 2021-08-27: qty 10

## 2021-08-27 MED ORDER — FENTANYL CITRATE (PF) 100 MCG/2ML IJ SOLN
INTRAMUSCULAR | Status: DC | PRN
  Administered 2021-08-27 (×2): 25 ug via INTRAVENOUS

## 2021-08-27 MED ORDER — SODIUM CHLORIDE 0.9 % IV SOLN: 250.0000 mL | INTRAVENOUS | Status: DC | PRN

## 2021-08-27 MED ORDER — LIDOCAINE HCL (PF) 1 % IJ SOLN
INTRAMUSCULAR | Status: DC | PRN
  Administered 2021-08-27: 1 mL
  Administered 2021-08-27: 2 mL

## 2021-08-27 MED ORDER — IOHEXOL 350 MG/ML SOLN
INTRAVENOUS | Status: DC | PRN
  Administered 2021-08-27: 60 mL

## 2021-08-27 MED ORDER — SODIUM CHLORIDE 0.9 % IV SOLN: INTRAVENOUS | Status: DC

## 2021-08-27 MED ORDER — HEPARIN (PORCINE) IN NACL 1000-0.9 UT/500ML-% IV SOLN
INTRAVENOUS | Status: AC
  Filled 2021-08-27: qty 1000

## 2021-08-27 MED ORDER — HEPARIN (PORCINE) IN NACL 1000-0.9 UT/500ML-% IV SOLN
INTRAVENOUS | Status: DC | PRN
  Administered 2021-08-27 (×2): 500 mL

## 2021-08-27 MED ORDER — FENTANYL CITRATE (PF) 100 MCG/2ML IJ SOLN
INTRAMUSCULAR | Status: AC
  Filled 2021-08-27: qty 2

## 2021-08-27 MED ORDER — RIVAROXABAN 20 MG PO TABS: 20.0000 mg | ORAL_TABLET | Freq: Every day | ORAL | Status: DC

## 2021-08-27 MED ORDER — VERAPAMIL HCL 2.5 MG/ML IV SOLN
INTRAVENOUS | Status: AC
  Filled 2021-08-27: qty 2

## 2021-08-27 MED ORDER — MIDAZOLAM HCL 2 MG/2ML IJ SOLN
INTRAMUSCULAR | Status: DC | PRN
  Administered 2021-08-27 (×2): 1 mg via INTRAVENOUS

## 2021-08-27 MED ORDER — LIDOCAINE HCL (PF) 1 % IJ SOLN
INTRAMUSCULAR | Status: AC
  Filled 2021-08-27: qty 30

## 2021-08-27 MED ORDER — ASPIRIN 81 MG PO CHEW: 81.0000 mg | CHEWABLE_TABLET | ORAL | Status: DC

## 2021-08-27 MED ORDER — MIDAZOLAM HCL 2 MG/2ML IJ SOLN
INTRAMUSCULAR | Status: AC
  Filled 2021-08-27: qty 2

## 2021-08-27 SURGICAL SUPPLY — 12 items
CATH BALLN WEDGE 5F 110CM (CATHETERS) ×1 IMPLANT
CATH OPTITORQUE TIG 4.0 5F (CATHETERS) ×1 IMPLANT
DEVICE RAD COMP TR BAND LRG (VASCULAR PRODUCTS) ×1 IMPLANT
GLIDESHEATH SLEND A-KIT 6F 22G (SHEATH) ×1 IMPLANT
GUIDEWIRE .025 260CM (WIRE) ×1 IMPLANT
GUIDEWIRE INQWIRE 1.5J.035X260 (WIRE) IMPLANT
INQWIRE 1.5J .035X260CM (WIRE) ×2
KIT HEART LEFT (KITS) ×2 IMPLANT
PACK CARDIAC CATHETERIZATION (CUSTOM PROCEDURE TRAY) ×2 IMPLANT
SHEATH GLIDE SLENDER 4/5FR (SHEATH) ×1 IMPLANT
TRANSDUCER W/STOPCOCK (MISCELLANEOUS) ×2 IMPLANT
TUBING CIL FLEX 10 FLL-RA (TUBING) ×2 IMPLANT

## 2021-08-27 NOTE — Interval H&P Note (Signed)
History and Physical Interval Note:  08/27/2021 1:48 PM  Anthony Case  has presented today for surgery, with the diagnosis of eoe.  The various methods of treatment have been discussed with the patient and family. After consideration of risks, benefits and other options for treatment, the patient has consented to  Procedure(s): RIGHT/LEFT HEART CATH AND CORONARY ANGIOGRAPHY (N/A) and possible angioplasty as a surgical intervention.  The patient's history has been reviewed, patient examined, no change in status, stable for surgery.  I have reviewed the patient's chart and labs.  Questions were answered to the patient's satisfaction.     Adrian Prows

## 2021-08-27 NOTE — H&P (View-Only) (Signed)
Primary Physician/Referring:  Donnajean Lopes, MD  Patient ID: Anthony Case, male    DOB: 03/05/1951, 71 y.o.   MRN: [id number]  No chief complaint on file.  HPI:    Anthony Case  is a 71 y.o. Caucasian male with history of PE in 2013 after a long travel and a spontaneous DVT in 2009, now on chronic Xarelto, diabetes mellitus, rheumatoid and psoriatic arthritis, ankylosing spondylitis, hypertension, mild hyperlipidemia, presents for f/u shortness of breath, bicuspid AV.   He underwent echocardiogram on 05/28/2021 revealing new onset cardiomyopathy, severely reduced LVEF and a new LBBB.  However he had also developed left ureteral stone and needed cystoscopy and lithotripsy which he underwent on 07/02/2021 without periprocedural complications.  He now presents for follow-up.  States that he has noticed easy fatigability in his legs when he walks, he had also developed an ulceration in his foot which fortunately has healed.  He has noticed decreased exercise capacity.  Denies dyspnea, PND or orthopnea. No new symptoms since I last saw him.  Past Medical History:  Diagnosis Date   Arthritis    Diabetes mellitus    Hx pulmonary embolism 08/26/2007   required emergent tPA treatment   Hypertension    Shortness of breath    Sleep apnea    Past Surgical History:  Procedure Laterality Date   BACK SURGERY     COLONOSCOPY  2009   adenoma polyp   CYSTOSCOPY/URETEROSCOPY/HOLMIUM LASER/STENT PLACEMENT Left 07/02/2021   Procedure: CYSTOSCOPY/RETROGRADE/URETEROSCOPY/HOLMIUM LASER/STENT PLACEMENT;  Surgeon: Festus Aloe, MD;  Location: [facility];  Service: Urology;  Laterality: Left;   ELBOW ARTHROSCOPY  2010   IR RADIOLOGIST EVAL & MGMT  12/25/2020   IR RADIOLOGIST EVAL & MGMT  12/28/2020   LUMBAR PERCUTANEOUS PEDICLE SCREW 3 LEVEL N/A 05/25/2013   Procedure: Posterior Percutaneous Fixation from Thoracic nine to Lumbar one vertebrae with arthrodesis of Thoracic eleven Fracture;   Surgeon: Kristeen Miss, MD;  Location: [place] [facility];  Service: Neurosurgery;  Laterality: N/A;  Posterior Percutaneous Fixation from Thoracic nine to Lumbar one vertebrae with arthrodesis of Thoracic eleven Fracture    ROS   Review of Systems  Cardiovascular:  Positive for claudication. Negative for chest pain, dyspnea on exertion and leg swelling.  Musculoskeletal:  Positive for arthritis, back pain and joint pain.  Gastrointestinal:  Negative for melena.  Neurological:  Positive for paresthesias (bilateral feet).  Objective   Vitals with BMI 08/27/2021 07/24/2021 07/02/2021  Height 5\' 11"  5\' 11"  -  Weight 250 lbs 252 lbs -  BMI 16.01 09.32 -  Systolic 355 732 202  Diastolic 80 64 73  Pulse 79 73 55    Blood pressure (!) 153/80, pulse 79, temperature 98.3 F (36.8 C), temperature source Oral, height 5\' 11"  (1.803 m), weight 113.4 kg, SpO2 97 %. Body mass index is 34.87 kg/m.   Physical Exam Neck:     Vascular: No carotid bruit or JVD.  Cardiovascular:     Rate and Rhythm: Normal rate and regular rhythm.     Pulses: Intact distal pulses.          Carotid pulses are 2+ on the right side and 2+ on the left side.      Femoral pulses are 2+ on the right side and 2+ on the left side.      Popliteal pulses are 2+ on the right side and 2+ on the left side.       Dorsalis pedis pulses are 0 on  the right side and 2+ on the left side.       Posterior tibial pulses are 0 on the right side and 0 on the left side.     Heart sounds: Heart sounds are distant. Murmur heard.  Midsystolic murmur is present with a grade of 1/6 at the upper right sternal border.    No gallop.  Pulmonary:     Effort: Pulmonary effort is normal.     Breath sounds: Normal breath sounds.  Abdominal:     General: Bowel sounds are normal.     Palpations: Abdomen is soft.  Musculoskeletal:        General: No swelling.   Laboratory examination:   External labs:  Cholesterol, total 131.000 m 02/28/2020 HDL 30 MG/DL  02/28/2020 LDL 36.000 mg 02/28/2020 Triglycerides 326.000 02/28/2020  A1C 7.100 % 02/28/2020 TSH 1.270 02/22/2019  Hemoglobin 15.200 g/d 02/28/2020 Platelets 194.000 X1 12/21/2019  Creatinine, Serum 0.900 mg/ 04/25/2020 Potassium 4.300 MM 10/02/2016 ALT (SGPT) 27.000 uni 02/28/2020   Medications and allergies   Allergies  Allergen Reactions   Lisinopril Cough    No current facility-administered medications on file prior to encounter.   Current Outpatient Medications on File Prior to Encounter  Medication Sig Dispense Refill   acetaminophen (TYLENOL) 500 MG tablet Take 1,000 mg by mouth every 6 (six) hours as needed for moderate pain.     Ascorbic Acid (VITAMIN C) 1000 MG tablet Take 1,000 mg by mouth daily.     atorvastatin (LIPITOR) 20 MG tablet Take 20 mg by mouth daily.     dapagliflozin propanediol (FARXIGA) 10 MG TABS tablet Take 10 mg by mouth daily.     glipiZIDE (GLUCOTROL XL) 2.5 MG 24 hr tablet Take 5 mg by mouth daily with breakfast.     LEVEMIR FLEXTOUCH 100 UNIT/ML FlexPen Inject 60 Units into the skin every evening.     losartan (COZAAR) 50 MG tablet Take 50 mg by mouth daily.     metFORMIN (GLUCOPHAGE) 500 MG tablet Take 2,000 mg by mouth daily with breakfast.     metoprolol succinate (TOPROL-XL) 25 MG 24 hr tablet Take 25 mg by mouth daily.     Multiple Vitamin (MULTIVITAMIN WITH MINERALS) TABS tablet Take 1 tablet by mouth daily.     Omega-3 Fatty Acids (FISH OIL) 1000 MG CAPS Take 1,000 mg by mouth daily.     Rivaroxaban (XARELTO) 20 MG TABS tablet Take 20 mg by mouth daily. 08/04/13 last dose of xarelto in preparation for colonoscopy     furosemide (LASIX) 20 MG tablet Take 20 mg by mouth.     HYDROcodone-acetaminophen (NORCO/VICODIN) 5-325 MG tablet Take 1 tablet by mouth every 6 (six) hours as needed for moderate pain.      Radiology:   CT angiogram chest 12/16/2017: No evidence of pulmonary embolus. Dependent atelectasis. Cardiovascular: No filling defects in the  pulmonary arteries to suggest pulmonary emboli. Insert Heart diffuse coronary artery calcifications. Scattered aortic calcifications. Extensive coronary artery calcifications/disease.  CT Angiogram of the abdomen with bilateral lower extremity angiogram 12/26/2020: Aortic atherosclerosis.  Aortic Atherosclerosis (ICD10-I70.0).   Minimal aortoiliac disease with no high-grade stenosis or occlusion. Hypogastric arteries remain patent as well as the pelvic arteries.  AAA.   Relatively symmetric mild atherosclerosis of the femoropopliteal segment.   Bilateral tibial and pedal circumferential calcifications, limiting evaluation of the tibial artery secondary to blooming artifact from the calcium.  Cardiac Studies:   Lower extremity venous duplex 12/03/2017:  Venous duplex: Right: There  is no evidence of deep vein thrombosis in the lower extremity.There is no evidence of superficial venous thrombosis. No cystic structure found in the popliteal fossa. Left: There is a chronic deep vein thrombosis noted in one of the paired posterior tibial veins in the mid calf. All other deep vein thrombus noted in the 2013 exam appear resolved. There is no evidence of superficial venous thrombosis. No cystic structure found in the popliteal fossa.  Lexiscan myoview stress test 12/25/2017: 1. Lexiscan stress test performed. Exercise capacity was not assessed. Stress symptoms included dyspnea, dizziness. Peak BP 154/96 mmHg. The resting electrocardiogram demonstrated normal sinus rhythm, normal resting conduction, VPC and normal rest repolarization. Stress EKG is non diagnostic for ischemia as it is a pharmacologic stress. 2. The overall quality of the study is excellent. There is no evidence of abnormal lung activity. Stress and rest SPECT images demonstrate homogeneous tracer distribution throughout the myocardium. Gated SPECT imaging reveals normal myocardial thickening and wall motion. The left ventricular  ejection fraction was normal (55%). Small apical defect likely related to subdiaphragmatic attenuation. 3. Low risk study.  Abdominal Aortic Duplex 05/28/2021: Moderate dilatation of the abdominal aorta is noted in the proximal aorta. Abdominal aortic aneurysm observed measuring 3.01 x 3.03 x 3.02 cm is seen.  Normal common iliac artery velocity bilateral. Recheck in 3 years for stability.  ABI 12/12/2020: Right: Resting right ankle-brachial index indicates noncompressible right lower extremity arteries. The right toe-brachial index is abnormal.  Pedal artery waveforms indicate adequate flow at rest.   Left: Resting left ankle-brachial index indicates noncompressible left lower extremity arteries. The left toe-brachial index is normal.  Pedal artery waveforms indicate adequate flow at rest.   PCV ECHOCARDIOGRAM COMPLETE 05/28/2021  Narrative Echocardiogram 05/28/2021: Left ventricle cavity is normal in size. Moderate concentric hypertrophy of the left ventricle. Abnormal septal wall motion due to left bundle branch block. Severe global hypokinesis. LVEF 25-30%. Doppler evidence of grade I (impaired) diastolic dysfunction, normal LAP. Bicuspid aortic valve raphe between noncoronary and left cusp. Mild calcification. Mild aortic valve stenosis. Vmax 2.2 m/sec, mean PG 10 mmHg, AVA 1.7 cm by continuity equation No regurgitation. Mild (Grade I) mitral regurgitation. Mild tricuspid regurgitation. No evidence of pulmonary hypertension. Compared to previous study in 2458, LV systolic dysfunction is new.    EKG:     EKG 07/24/2021: Sinus rhythm with first-degree AV block at rate of 66 bpm, left bundle branch block.  No further analysis.  Compared to 06/20/2020, LBBB is new.  Assessment   No diagnosis found.   Meds ordered this encounter  Medications   sodium chloride flush (NS) 0.9 % injection 3 mL   sodium chloride flush (NS) 0.9 % injection 3 mL   0.9 %  sodium chloride infusion   0.9  %  sodium chloride infusion   aspirin chewable tablet 81 mg   Heparin (Porcine) in NaCl 1000-0.9 UT/500ML-% SOLN   midazolam (VERSED) injection   fentaNYL (SUBLIMAZE) injection     There are no discontinued medications.    Recommendations:   Mr. Subhan Hoopes is a 71 y.o. Caucasian male with history of PE in 2013 after a long travel and a spontaneous DVT in 2009, now on chronic Xarelto, diabetes mellitus, rheumatoid and psoriatic arthritis, ankylosing spondylitis, hypertension, mild hyperlipidemia, presents for f/u shortness of breath, bicuspid AV.    New onset cardiomyopathy with new onset left bundle branch block.  Patient with uncontrolled diabetes mellitus, hypertension, hyperlipidemia and small vessel PAD  I will set him  up for right and left heart catheterization to evaluate his symptoms of decreased exercise capacity and easy fatigability.  With regard to PAD, physical examination is consistent with small vessel disease, continue risk factor modification is only indicated.  He will need optimization of therapy for cardiomyopathy, however would like to wait till cardiac catheterization to evaluate his coronary anatomy first.  Space  Blood pressures well controlled.  He needs lipid profile testing. He is presently on low-dose metoprolol succinate 25 mg daily, losartan 50 mg daily.  This needs to be optimized to increasing the dose of beta-blocker and also changing to [facility].  He is also on Farxiga 10 mg daily which we should continue for now.  This is a >40-minute office visit encounter.   Adrian Prows, MD, [facility] 08/27/2021, 1:46 PM Office: [phone] Pager: [phone]

## 2021-08-27 NOTE — Progress Notes (Signed)
Primary Physician/Referring:  Donnajean Lopes, MD  Patient ID: Anthony Case, male    DOB: 11-04-1950, 71 y.o.   MRN: [id number]  No chief complaint on file.  HPI:    Anthony Case  is a 71 y.o. Caucasian male with history of PE in 2013 after a long travel and a spontaneous DVT in 2009, now on chronic Xarelto, diabetes mellitus, rheumatoid and psoriatic arthritis, ankylosing spondylitis, hypertension, mild hyperlipidemia, presents for f/u shortness of breath, bicuspid AV.   He underwent echocardiogram on 05/28/2021 revealing new onset cardiomyopathy, severely reduced LVEF and a new LBBB.  However he had also developed left ureteral stone and needed cystoscopy and lithotripsy which he underwent on 07/02/2021 without periprocedural complications.  He now presents for follow-up.  States that he has noticed easy fatigability in his legs when he walks, he had also developed an ulceration in his foot which fortunately has healed.  He has noticed decreased exercise capacity.  Denies dyspnea, PND or orthopnea. No new symptoms since I last saw him.  Past Medical History:  Diagnosis Date   Arthritis    Diabetes mellitus    Hx pulmonary embolism 08/26/2007   required emergent tPA treatment   Hypertension    Shortness of breath    Sleep apnea    Past Surgical History:  Procedure Laterality Date   BACK SURGERY     COLONOSCOPY  2009   adenoma polyp   CYSTOSCOPY/URETEROSCOPY/HOLMIUM LASER/STENT PLACEMENT Left 07/02/2021   Procedure: CYSTOSCOPY/RETROGRADE/URETEROSCOPY/HOLMIUM LASER/STENT PLACEMENT;  Surgeon: Festus Aloe, MD;  Location: [facility];  Service: Urology;  Laterality: Left;   ELBOW ARTHROSCOPY  2010   IR RADIOLOGIST EVAL & MGMT  12/25/2020   IR RADIOLOGIST EVAL & MGMT  12/28/2020   LUMBAR PERCUTANEOUS PEDICLE SCREW 3 LEVEL N/A 05/25/2013   Procedure: Posterior Percutaneous Fixation from Thoracic nine to Lumbar one vertebrae with arthrodesis of Thoracic eleven Fracture;   Surgeon: Kristeen Miss, MD;  Location: [place] [facility];  Service: Neurosurgery;  Laterality: N/A;  Posterior Percutaneous Fixation from Thoracic nine to Lumbar one vertebrae with arthrodesis of Thoracic eleven Fracture    ROS   Review of Systems  Cardiovascular:  Positive for claudication. Negative for chest pain, dyspnea on exertion and leg swelling.  Musculoskeletal:  Positive for arthritis, back pain and joint pain.  Gastrointestinal:  Negative for melena.  Neurological:  Positive for paresthesias (bilateral feet).  Objective   Vitals with BMI 08/27/2021 07/24/2021 07/02/2021  Height 5\' 11"  5\' 11"  -  Weight 250 lbs 252 lbs -  BMI 33.00 76.22 -  Systolic 633 354 562  Diastolic 80 64 73  Pulse 79 73 55    Blood pressure (!) 153/80, pulse 79, temperature 98.3 F (36.8 C), temperature source Oral, height 5\' 11"  (1.803 m), weight 113.4 kg, SpO2 97 %. Body mass index is 34.87 kg/m.   Physical Exam Neck:     Vascular: No carotid bruit or JVD.  Cardiovascular:     Rate and Rhythm: Normal rate and regular rhythm.     Pulses: Intact distal pulses.          Carotid pulses are 2+ on the right side and 2+ on the left side.      Femoral pulses are 2+ on the right side and 2+ on the left side.      Popliteal pulses are 2+ on the right side and 2+ on the left side.       Dorsalis pedis pulses are 0 on  the right side and 2+ on the left side.       Posterior tibial pulses are 0 on the right side and 0 on the left side.     Heart sounds: Heart sounds are distant. Murmur heard.  Midsystolic murmur is present with a grade of 1/6 at the upper right sternal border.    No gallop.  Pulmonary:     Effort: Pulmonary effort is normal.     Breath sounds: Normal breath sounds.  Abdominal:     General: Bowel sounds are normal.     Palpations: Abdomen is soft.  Musculoskeletal:        General: No swelling.   Laboratory examination:   External labs:  Cholesterol, total 131.000 m 02/28/2020 HDL 30 MG/DL  02/28/2020 LDL 36.000 mg 02/28/2020 Triglycerides 326.000 02/28/2020  A1C 7.100 % 02/28/2020 TSH 1.270 02/22/2019  Hemoglobin 15.200 g/d 02/28/2020 Platelets 194.000 X1 12/21/2019  Creatinine, Serum 0.900 mg/ 04/25/2020 Potassium 4.300 MM 10/02/2016 ALT (SGPT) 27.000 uni 02/28/2020   Medications and allergies   Allergies  Allergen Reactions   Lisinopril Cough    No current facility-administered medications on file prior to encounter.   Current Outpatient Medications on File Prior to Encounter  Medication Sig Dispense Refill   acetaminophen (TYLENOL) 500 MG tablet Take 1,000 mg by mouth every 6 (six) hours as needed for moderate pain.     Ascorbic Acid (VITAMIN C) 1000 MG tablet Take 1,000 mg by mouth daily.     atorvastatin (LIPITOR) 20 MG tablet Take 20 mg by mouth daily.     dapagliflozin propanediol (FARXIGA) 10 MG TABS tablet Take 10 mg by mouth daily.     glipiZIDE (GLUCOTROL XL) 2.5 MG 24 hr tablet Take 5 mg by mouth daily with breakfast.     LEVEMIR FLEXTOUCH 100 UNIT/ML FlexPen Inject 60 Units into the skin every evening.     losartan (COZAAR) 50 MG tablet Take 50 mg by mouth daily.     metFORMIN (GLUCOPHAGE) 500 MG tablet Take 2,000 mg by mouth daily with breakfast.     metoprolol succinate (TOPROL-XL) 25 MG 24 hr tablet Take 25 mg by mouth daily.     Multiple Vitamin (MULTIVITAMIN WITH MINERALS) TABS tablet Take 1 tablet by mouth daily.     Omega-3 Fatty Acids (FISH OIL) 1000 MG CAPS Take 1,000 mg by mouth daily.     Rivaroxaban (XARELTO) 20 MG TABS tablet Take 20 mg by mouth daily. 08/04/13 last dose of xarelto in preparation for colonoscopy     furosemide (LASIX) 20 MG tablet Take 20 mg by mouth.     HYDROcodone-acetaminophen (NORCO/VICODIN) 5-325 MG tablet Take 1 tablet by mouth every 6 (six) hours as needed for moderate pain.      Radiology:   CT angiogram chest 12/16/2017: No evidence of pulmonary embolus. Dependent atelectasis. Cardiovascular: No filling defects in the  pulmonary arteries to suggest pulmonary emboli. Insert Heart diffuse coronary artery calcifications. Scattered aortic calcifications. Extensive coronary artery calcifications/disease.  CT Angiogram of the abdomen with bilateral lower extremity angiogram 12/26/2020: Aortic atherosclerosis.  Aortic Atherosclerosis (ICD10-I70.0).   Minimal aortoiliac disease with no high-grade stenosis or occlusion. Hypogastric arteries remain patent as well as the pelvic arteries.  AAA.   Relatively symmetric mild atherosclerosis of the femoropopliteal segment.   Bilateral tibial and pedal circumferential calcifications, limiting evaluation of the tibial artery secondary to blooming artifact from the calcium.  Cardiac Studies:   Lower extremity venous duplex 12/03/2017:  Venous duplex: Right: There  is no evidence of deep vein thrombosis in the lower extremity.There is no evidence of superficial venous thrombosis. No cystic structure found in the popliteal fossa. Left: There is a chronic deep vein thrombosis noted in one of the paired posterior tibial veins in the mid calf. All other deep vein thrombus noted in the 2013 exam appear resolved. There is no evidence of superficial venous thrombosis. No cystic structure found in the popliteal fossa.  Lexiscan myoview stress test 12/25/2017: 1. Lexiscan stress test performed. Exercise capacity was not assessed. Stress symptoms included dyspnea, dizziness. Peak BP 154/96 mmHg. The resting electrocardiogram demonstrated normal sinus rhythm, normal resting conduction, VPC and normal rest repolarization. Stress EKG is non diagnostic for ischemia as it is a pharmacologic stress. 2. The overall quality of the study is excellent. There is no evidence of abnormal lung activity. Stress and rest SPECT images demonstrate homogeneous tracer distribution throughout the myocardium. Gated SPECT imaging reveals normal myocardial thickening and wall motion. The left ventricular  ejection fraction was normal (55%). Small apical defect likely related to subdiaphragmatic attenuation. 3. Low risk study.  Abdominal Aortic Duplex 05/28/2021: Moderate dilatation of the abdominal aorta is noted in the proximal aorta. Abdominal aortic aneurysm observed measuring 3.01 x 3.03 x 3.02 cm is seen.  Normal common iliac artery velocity bilateral. Recheck in 3 years for stability.  ABI 12/12/2020: Right: Resting right ankle-brachial index indicates noncompressible right lower extremity arteries. The right toe-brachial index is abnormal.  Pedal artery waveforms indicate adequate flow at rest.   Left: Resting left ankle-brachial index indicates noncompressible left lower extremity arteries. The left toe-brachial index is normal.  Pedal artery waveforms indicate adequate flow at rest.   PCV ECHOCARDIOGRAM COMPLETE 05/28/2021  Narrative Echocardiogram 05/28/2021: Left ventricle cavity is normal in size. Moderate concentric hypertrophy of the left ventricle. Abnormal septal wall motion due to left bundle branch block. Severe global hypokinesis. LVEF 25-30%. Doppler evidence of grade I (impaired) diastolic dysfunction, normal LAP. Bicuspid aortic valve raphe between noncoronary and left cusp. Mild calcification. Mild aortic valve stenosis. Vmax 2.2 m/sec, mean PG 10 mmHg, AVA 1.7 cm by continuity equation No regurgitation. Mild (Grade I) mitral regurgitation. Mild tricuspid regurgitation. No evidence of pulmonary hypertension. Compared to previous study in 3557, LV systolic dysfunction is new.    EKG:     EKG 07/24/2021: Sinus rhythm with first-degree AV block at rate of 66 bpm, left bundle branch block.  No further analysis.  Compared to 06/20/2020, LBBB is new.  Assessment   No diagnosis found.   Meds ordered this encounter  Medications   sodium chloride flush (NS) 0.9 % injection 3 mL   sodium chloride flush (NS) 0.9 % injection 3 mL   0.9 %  sodium chloride infusion   0.9  %  sodium chloride infusion   aspirin chewable tablet 81 mg   Heparin (Porcine) in NaCl 1000-0.9 UT/500ML-% SOLN   midazolam (VERSED) injection   fentaNYL (SUBLIMAZE) injection     There are no discontinued medications.    Recommendations:   Mr. Kaitlin Ardito is a 71 y.o. Caucasian male with history of PE in 2013 after a long travel and a spontaneous DVT in 2009, now on chronic Xarelto, diabetes mellitus, rheumatoid and psoriatic arthritis, ankylosing spondylitis, hypertension, mild hyperlipidemia, presents for f/u shortness of breath, bicuspid AV.    New onset cardiomyopathy with new onset left bundle branch block.  Patient with uncontrolled diabetes mellitus, hypertension, hyperlipidemia and small vessel PAD  I will set him  up for right and left heart catheterization to evaluate his symptoms of decreased exercise capacity and easy fatigability.  With regard to PAD, physical examination is consistent with small vessel disease, continue risk factor modification is only indicated.  He will need optimization of therapy for cardiomyopathy, however would like to wait till cardiac catheterization to evaluate his coronary anatomy first.  Space  Blood pressures well controlled.  He needs lipid profile testing. He is presently on low-dose metoprolol succinate 25 mg daily, losartan 50 mg daily.  This needs to be optimized to increasing the dose of beta-blocker and also changing to [facility].  He is also on Farxiga 10 mg daily which we should continue for now.  This is a >40-minute office visit encounter.   Adrian Prows, MD, [facility] 08/27/2021, 1:46 PM Office: [phone] Pager: [phone]

## 2021-08-28 ENCOUNTER — Encounter (HOSPITAL_COMMUNITY): Payer: Self-pay | Admitting: Cardiology

## 2021-08-28 ENCOUNTER — Ambulatory Visit: Payer: Medicare Other | Admitting: Cardiology

## 2021-09-03 DIAGNOSIS — L4050 Arthropathic psoriasis, unspecified: Secondary | ICD-10-CM | POA: Diagnosis not present

## 2021-09-09 ENCOUNTER — Encounter: Payer: Self-pay | Admitting: Cardiology

## 2021-09-09 ENCOUNTER — Other Ambulatory Visit: Payer: Self-pay

## 2021-09-09 ENCOUNTER — Ambulatory Visit: Payer: Medicare Other | Admitting: Cardiology

## 2021-09-09 VITALS — BP 125/71 | HR 80 | Temp 98.3°F | Resp 16 | Ht 71.0 in | Wt 255.4 lb

## 2021-09-09 DIAGNOSIS — I10 Essential (primary) hypertension: Secondary | ICD-10-CM

## 2021-09-09 DIAGNOSIS — R0609 Other forms of dyspnea: Secondary | ICD-10-CM

## 2021-09-09 DIAGNOSIS — I428 Other cardiomyopathies: Secondary | ICD-10-CM

## 2021-09-09 DIAGNOSIS — I5022 Chronic systolic (congestive) heart failure: Secondary | ICD-10-CM

## 2021-09-09 DIAGNOSIS — I447 Left bundle-branch block, unspecified: Secondary | ICD-10-CM

## 2021-09-09 MED ORDER — METOPROLOL SUCCINATE ER 50 MG PO TB24: 50.0000 mg | ORAL_TABLET | Freq: Every day | ORAL | 1 refills | Status: DC

## 2021-09-09 MED ORDER — ENTRESTO 49-51 MG PO TABS: 1.0000 | ORAL_TABLET | Freq: Two times a day (BID) | ORAL | 1 refills | Status: DC

## 2021-09-09 NOTE — Progress Notes (Signed)
Primary Physician/Referring:  Donnajean Lopes, MD  Patient ID: Anthony Case, male    DOB: Jul 31, 1951, 71 y.o.   MRN: [id number]  Chief Complaint  Patient presents with   Post cath    No stent placement   Follow-up   HPI:    Anthony Case  is a 71 y.o. Caucasian male with history of PE in 2013 after a long travel and a spontaneous DVT in 2009, now on chronic Xarelto, diabetes mellitus, rheumatoid and psoriatic arthritis, ankylosing spondylitis, hypertension, mild hyperlipidemia, bicuspid aortic valve, PAD with small vessel disease due to uncontrolled diabetes mellitus, who had new onset left bundle branch block and also cardiomyopathy with severe LV systolic dysfunction in October 2022.  He is accompanied by his wife, underwent right and left heart catheterization performed on 09/16/2021 essentially revealing nonischemic dilated cardiomyopathy.  He has had COVID in June 2022, also had upper respiratory infections as well.   He now presents for follow-up.  States that he has noticed easy fatigability in his legs when he walks. He has noticed decreased exercise capacity.  Denies dyspnea, PND or orthopnea.  Past Medical History:  Diagnosis Date   Arthritis    Diabetes mellitus    Hx pulmonary embolism 08/26/2007   required emergent tPA treatment   Hypertension    Shortness of breath    Sleep apnea    Past Surgical History:  Procedure Laterality Date   BACK SURGERY     COLONOSCOPY  2009   adenoma polyp   CYSTOSCOPY/URETEROSCOPY/HOLMIUM LASER/STENT PLACEMENT Left 07/02/2021   Procedure: CYSTOSCOPY/RETROGRADE/URETEROSCOPY/HOLMIUM LASER/STENT PLACEMENT;  Surgeon: Festus Aloe, MD;  Location: [facility];  Service: Urology;  Laterality: Left;   ELBOW ARTHROSCOPY  2010   IR RADIOLOGIST EVAL & MGMT  12/25/2020   IR RADIOLOGIST EVAL & MGMT  12/28/2020   LUMBAR PERCUTANEOUS PEDICLE SCREW 3 LEVEL N/A 05/25/2013   Procedure: Posterior Percutaneous Fixation from Thoracic nine to  Lumbar one vertebrae with arthrodesis of Thoracic eleven Fracture;  Surgeon: Kristeen Miss, MD;  Location: [facility];  Service: Neurosurgery;  Laterality: N/A;  Posterior Percutaneous Fixation from Thoracic nine to Lumbar one vertebrae with arthrodesis of Thoracic eleven Fracture   RIGHT/LEFT HEART CATH AND CORONARY ANGIOGRAPHY N/A 08/27/2021   Procedure: RIGHT/LEFT HEART CATH AND CORONARY ANGIOGRAPHY;  Surgeon: Adrian Prows, MD;  Location: [facility];  Service: Cardiovascular;  Laterality: N/A;    ROS   Review of Systems  Cardiovascular:  Positive for claudication. Negative for chest pain, dyspnea on exertion and leg swelling.  Musculoskeletal:  Positive for arthritis, back pain and joint pain.  Gastrointestinal:  Negative for melena.  Neurological:  Positive for paresthesias (bilateral feet).  Objective   Vitals with BMI 09/09/2021 08/27/2021 08/27/2021  Height 5\' 11"  - -  Weight 255 lbs 6 oz - -  BMI 53.61 - -  Systolic 443 154 008  Diastolic 71 78 676  Pulse 80 61 53    Blood pressure 125/71, pulse 80, temperature 98.3 F (36.8 C), temperature source Temporal, resp. rate 16, height 5\' 11"  (1.803 m), weight 255 lb 6.4 oz (115.8 kg), SpO2 99 %. Body mass index is 35.62 kg/m.   Physical Exam Neck:     Vascular: No carotid bruit or JVD.  Cardiovascular:     Rate and Rhythm: Normal rate and regular rhythm.     Pulses:          Carotid pulses are 2+ on the right side and 2+ on  the left side.      Femoral pulses are 2+ on the right side and 2+ on the left side.      Popliteal pulses are 2+ on the right side and 2+ on the left side.       Dorsalis pedis pulses are 0 on the right side and 2+ on the left side.       Posterior tibial pulses are 0 on the right side and 0 on the left side.     Heart sounds: Heart sounds are distant. Murmur heard.  Midsystolic murmur is present with a grade of 1/6 at the upper right sternal border.    No gallop.  Pulmonary:     Effort: Pulmonary  effort is normal.     Breath sounds: Normal breath sounds.  Abdominal:     General: Bowel sounds are normal.     Palpations: Abdomen is soft.  Musculoskeletal:        General: No swelling.   Laboratory examination:   External labs:  Cholesterol, total 131.000 m 02/28/2020 HDL 30 MG/DL 02/28/2020 LDL 36.000 mg 02/28/2020 Triglycerides 326.000 02/28/2020  A1C 7.100 % 02/28/2020 TSH 1.270 02/22/2019  Hemoglobin 15.200 g/d 02/28/2020 Platelets 194.000 X1 12/21/2019  Creatinine, Serum 0.900 mg/ 04/25/2020 Potassium 4.300 MM 10/02/2016 ALT (SGPT) 27.000 uni 02/28/2020   Medications and allergies   Allergies  Allergen Reactions   Lisinopril Cough    Current Outpatient Medications on File Prior to Visit  Medication Sig Dispense Refill   acetaminophen (TYLENOL) 500 MG tablet Take 1,000 mg by mouth every 6 (six) hours as needed for moderate pain.     Ascorbic Acid (VITAMIN C) 1000 MG tablet Take 1,000 mg by mouth daily.     atorvastatin (LIPITOR) 20 MG tablet Take 20 mg by mouth daily.     dapagliflozin propanediol (FARXIGA) 10 MG TABS tablet Take 10 mg by mouth daily.     furosemide (LASIX) 20 MG tablet Take 20 mg by mouth daily as needed for edema.     glipiZIDE (GLUCOTROL XL) 2.5 MG 24 hr tablet Take 5 mg by mouth daily with breakfast.     HYDROcodone-acetaminophen (NORCO/VICODIN) 5-325 MG tablet Take 1 tablet by mouth every 6 (six) hours as needed for moderate pain.     LEVEMIR FLEXTOUCH 100 UNIT/ML FlexPen Inject 60 Units into the skin every evening.     metFORMIN (GLUCOPHAGE) 500 MG tablet Take 2,000 mg by mouth daily with breakfast.     Multiple Vitamin (MULTIVITAMIN WITH MINERALS) TABS tablet Take 1 tablet by mouth daily.     NON FORMULARY Trimix     Omega-3 Fatty Acids (FISH OIL) 1000 MG CAPS Take 1,000 mg by mouth daily.     rivaroxaban (XARELTO) 20 MG TABS tablet Take 1 tablet (20 mg total) by mouth daily. 08/04/13 last dose of xarelto in preparation for colonoscopy 30 tablet    Vitamin  D, Ergocalciferol, (DRISDOL) 1.25 MG (50000 UNIT) CAPS capsule Take 50,000 Units by mouth every 7 (seven) days.     No current facility-administered medications on file prior to visit.    Radiology:   CT angiogram chest 12/16/2017: No evidence of pulmonary embolus. Dependent atelectasis. Cardiovascular: No filling defects in the pulmonary arteries to suggest pulmonary emboli. Insert Heart diffuse coronary artery calcifications. Scattered aortic calcifications. Extensive coronary artery calcifications/disease.  CT Angiogram of the abdomen with bilateral lower extremity angiogram 12/26/2020: Aortic atherosclerosis.  Aortic Atherosclerosis (ICD10-I70.0).   Minimal aortoiliac disease with no high-grade stenosis or  occlusion. Hypogastric arteries remain patent as well as the pelvic arteries.  AAA.   Relatively symmetric mild atherosclerosis of the femoropopliteal segment.   Bilateral tibial and pedal circumferential calcifications, limiting evaluation of the tibial artery secondary to blooming artifact from the calcium.  Cardiac Studies:   Lower extremity venous duplex 12/03/2017:  Venous duplex: Right: There is no evidence of deep vein thrombosis in the lower extremity.There is no evidence of superficial venous thrombosis. No cystic structure found in the popliteal fossa. Left: There is a chronic deep vein thrombosis noted in one of the paired posterior tibial veins in the mid calf. All other deep vein thrombus noted in the 2013 exam appear resolved. There is no evidence of superficial venous thrombosis. No cystic structure found in the popliteal fossa.  Lexiscan myoview stress test 12/25/2017: 1. Lexiscan stress test performed. Exercise capacity was not assessed. Stress symptoms included dyspnea, dizziness. Peak BP 154/96 mmHg. The resting electrocardiogram demonstrated normal sinus rhythm, normal resting conduction, VPC and normal rest repolarization. Stress EKG is non diagnostic for  ischemia as it is a pharmacologic stress. 2. The overall quality of the study is excellent. There is no evidence of abnormal lung activity. Stress and rest SPECT images demonstrate homogeneous tracer distribution throughout the myocardium. Gated SPECT imaging reveals normal myocardial thickening and wall motion. The left ventricular ejection fraction was normal (55%). Small apical defect likely related to subdiaphragmatic attenuation. 3. Low risk study.  Abdominal Aortic Duplex 05/28/2021: Moderate dilatation of the abdominal aorta is noted in the proximal aorta. Abdominal aortic aneurysm observed measuring 3.01 x 3.03 x 3.02 cm is seen.  Normal common iliac artery velocity bilateral. Recheck in 3 years for stability.  ABI 12/12/2020: Right: Resting right ankle-brachial index indicates noncompressible right lower extremity arteries. The right toe-brachial index is abnormal.  Pedal artery waveforms indicate adequate flow at rest.   Left: Resting left ankle-brachial index indicates noncompressible left lower extremity arteries. The left toe-brachial index is normal.  Pedal artery waveforms indicate adequate flow at rest.   PCV ECHOCARDIOGRAM COMPLETE 05/28/2021  Narrative Echocardiogram 05/28/2021: Left ventricle cavity is normal in size. Moderate concentric hypertrophy of the left ventricle. Abnormal septal wall motion due to left bundle branch block. Severe global hypokinesis. LVEF 25-30%. Doppler evidence of grade I (impaired) diastolic dysfunction, normal LAP. Bicuspid aortic valve raphe between noncoronary and left cusp. Mild calcification. Mild aortic valve stenosis. Vmax 2.2 m/sec, mean PG 10 mmHg, AVA 1.7 cm by continuity equation No regurgitation. Mild (Grade I) mitral regurgitation. Mild tricuspid regurgitation. No evidence of pulmonary hypertension. Compared to previous study in 3267, LV systolic dysfunction is new.  Right and left heart catheterization 08/27/2021: RA 4/3, mean 2  mmHg RV 27/0, EDP 4 mmHg PA 30/10, mean 17 mmHg PW 11/11, mean 9 mmHg. LV 151/8, EDP 18 mmHg.  Ao 145/74, mean 99 mmHg.  No significant pressure gradient across the aortic valve. LVEF  30 to 35% with global hypokinesis.  No significant mitral regurgitation. LM: Mildly calcified but normal. LAD: Large vessel, mild to moderately calcified, there are tandem 40% stenosis in the midsegment due to calcification but without evidence of high-grade stenosis.  Large D1 and D2. CX: Large vessel.  Mild diffuse disease.  Large OM1. RI: Large vessel.  Smooth and normal. RCA: Very large caliber vessel.  Mild diffuse calcification noted.  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  60 mL contrast utilized.    EKG:  EKG 07/24/2021: Sinus rhythm with first-degree AV block at rate of 66 bpm, left bundle branch block.  No further analysis.  Compared to 06/20/2020, LBBB is new.  Assessment     ICD-10-CM   1. Non-ischemic cardiomyopathy (HCC)  Q11.9 Basic metabolic panel    2. Dyspnea on exertion  R06.09     3. LBBB (left bundle branch block)  I44.7     4. Essential hypertension  I10     5. Chronic systolic heart failure (HCC)  I50.22 metoprolol succinate (TOPROL-XL) 50 MG 24 hr tablet    sacubitril-valsartan (ENTRESTO) 49-51 MG      Meds ordered this encounter  Medications   metoprolol succinate (TOPROL-XL) 50 MG 24 hr tablet    Sig: Take 1 tablet (50 mg total) by mouth daily.    Dispense:  90 tablet    Refill:  1   sacubitril-valsartan (ENTRESTO) 49-51 MG    Sig: Take 1 tablet by mouth 2 (two) times daily.    Dispense:  60 tablet    Refill:  1     Medications Discontinued During This Encounter  Medication Reason   losartan (COZAAR) 50 MG tablet Change in therapy   metoprolol succinate (TOPROL-XL) 25 MG 24 hr tablet Reorder     Recommendations:   Mr. Verdell Dykman is a 71 y.o. Caucasian male with  history of PE in 2013 after a long travel and a spontaneous DVT in 2009, now on chronic Xarelto, diabetes mellitus, rheumatoid and psoriatic arthritis, ankylosing spondylitis, hypertension, mild hyperlipidemia, bicuspid aortic valve, PAD with small vessel disease due to uncontrolled diabetes mellitus, who had new onset left bundle branch block and also cardiomyopathy with severe LV systolic dysfunction in October 2022.  He is accompanied by his wife, I reviewed the results of the right and left heart catheterization performed on 09/16/2021 essentially revealing nonischemic dilated cardiomyopathy.  He has had COVID in June 2022, also had upper respiratory infections as well.  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  Will increase metoprolol succinate to 50 mg daily, discontinue losartan and switch him to Entresto 49/51 mg twice daily.  I will see him back in 3 weeks with a BMP performed 2 weeks later.  He has class II-III CHF with dyspnea on exertion.  Blood pressures well controlled.  Otherwise on appropriate guideline directed medical therapy all questions answered.  We have touched base upon mechanical therapy for decreased EF with underlying LBBB.   This is a >40-minute office visit encounter.   Adrian Prows, MD, [facility] 09/09/2021, 10:11 AM Office: [phone] Pager: [phone]

## 2021-09-09 NOTE — Patient Instructions (Signed)
Please start with increasing the metoprolol succinate from 25 mg to 50 mg daily for 3 to 4 days, after you tolerate the medication, discontinue losartan and start Entresto 49/51 mg p.o. twice daily.  After you make this switch, 2 weeks later I would like you to get blood work done at [facility].  I would like to see you back here in 3 weeks.

## 2021-10-02 ENCOUNTER — Encounter: Payer: Self-pay | Admitting: Cardiology

## 2021-10-02 ENCOUNTER — Ambulatory Visit: Payer: Medicare Other | Admitting: Cardiology

## 2021-10-02 ENCOUNTER — Other Ambulatory Visit: Payer: Self-pay

## 2021-10-02 VITALS — BP 121/70 | HR 61 | Temp 97.9°F | Resp 16 | Ht 71.0 in | Wt 258.4 lb

## 2021-10-02 DIAGNOSIS — I428 Other cardiomyopathies: Secondary | ICD-10-CM

## 2021-10-02 DIAGNOSIS — I447 Left bundle-branch block, unspecified: Secondary | ICD-10-CM | POA: Diagnosis not present

## 2021-10-02 DIAGNOSIS — I5022 Chronic systolic (congestive) heart failure: Secondary | ICD-10-CM | POA: Diagnosis not present

## 2021-10-02 DIAGNOSIS — I10 Essential (primary) hypertension: Secondary | ICD-10-CM | POA: Diagnosis not present

## 2021-10-02 NOTE — Progress Notes (Signed)
Primary Physician/Referring:  Donnajean Lopes, MD  Patient ID: Anthony Case, male    DOB: 06-Sep-1950, 71 y.o.   MRN: [id number]  Chief Complaint  Patient presents with   Congestive Heart Failure   Follow-up    3 weeks    HPI:    Anthony Case  is a 71 y.o. Caucasian male with history of PE in 2013 after a long travel and a spontaneous DVT in 2009, now on chronic Xarelto, diabetes mellitus, rheumatoid and psoriatic arthritis, ankylosing spondylitis, hypertension, mild hyperlipidemia, bicuspid aortic valve, PAD with small vessel disease due to uncontrolled diabetes mellitus, who had new onset left bundle branch block and Anthony cardiomyopathy with severe LV systolic dysfunction in October Case,  right and left heart catheterization performed on 09/16/2021 essentially revealing nonischemic dilated cardiomyopathy.  Anthony Case, Anthony had upper respiratory infections as well.     Anthony presents for a 3-week follow-up of nonischemic dilated cardiomyopathy and chronic systolic heart failure.  Anthony is presently tolerating Entresto and Anthony high-dose of beta-blocker therapy but has occasional dizziness.  Anthony continues to have mild fatigue and dyspnea on exertion.  No PND or orthopnea.   Past Medical History:  Diagnosis Date   Arthritis    Diabetes mellitus    Hx pulmonary embolism 08/26/2007   required emergent tPA treatment   Hypertension    Shortness of breath    Sleep apnea    Past Surgical History:  Procedure Laterality Date   BACK SURGERY     COLONOSCOPY  2009   adenoma polyp   CYSTOSCOPY/URETEROSCOPY/HOLMIUM LASER/STENT PLACEMENT Left 11/8/Case   Procedure: CYSTOSCOPY/RETROGRADE/URETEROSCOPY/HOLMIUM LASER/STENT PLACEMENT;  Surgeon: Festus Aloe, MD;  Location: [facility];  Service: Urology;  Laterality: Left;   ELBOW ARTHROSCOPY  2010   IR RADIOLOGIST EVAL & MGMT  5/3/Case   IR RADIOLOGIST EVAL & MGMT  5/6/Case   LUMBAR PERCUTANEOUS PEDICLE SCREW 3 LEVEL N/A  05/25/2013   Procedure: Posterior Percutaneous Fixation from Thoracic nine to Lumbar one vertebrae with arthrodesis of Thoracic eleven Fracture;  Surgeon: Kristeen Miss, MD;  Location: [facility];  Service: Neurosurgery;  Laterality: N/A;  Posterior Percutaneous Fixation from Thoracic nine to Lumbar one vertebrae with arthrodesis of Thoracic eleven Fracture   RIGHT/LEFT HEART CATH AND CORONARY ANGIOGRAPHY N/A 08/27/2021   Procedure: RIGHT/LEFT HEART CATH AND CORONARY ANGIOGRAPHY;  Surgeon: Adrian Prows, MD;  Location: [facility];  Service: Cardiovascular;  Laterality: N/A;   Family History  Problem Relation Age of Onset   Heart disease Mother    Schizophrenia Mother    Heart disease Father    Diabetes Father    Heart disease Brother     Social History   Tobacco Use   Smoking status: Never   Smokeless tobacco: Never  Substance Use Topics   Alcohol use: Not Currently    Comment: Previously drinking 3-5 drinks nightly x 20 year   Marital Status: Married  ROS  Review of Systems  Cardiovascular:  Positive for claudication. Negative for chest pain, dyspnea on exertion and leg swelling.  Musculoskeletal:  Positive for arthritis, back pain and joint pain.  Gastrointestinal:  Negative for melena.  Neurological:  Positive for paresthesias (bilateral feet).  Objective  Blood pressure 121/70, pulse 61, temperature 97.9 F (36.6 C), temperature source Temporal, resp. rate 16, height 5\' 11"  (1.803 m), weight 258 lb 6.4 oz (117.2 kg), SpO2 98 %. Body mass index is 36.04 kg/m.  Vitals with BMI  10/02/2021 09/09/2021 08/27/2021  Height 5\' 11"  5\' 11"  -  Weight 258 lbs 6 oz 255 lbs 6 oz -  BMI 72.09 47.09 -  Systolic 628 366 294  Diastolic 70 71 78  Pulse 61 80 61    Physical Exam Neck:     Vascular: No carotid bruit or JVD.  Cardiovascular:     Rate and Rhythm: Normal rate and regular rhythm.     Pulses:          Carotid pulses are 2+ on the right side and 2+ on the left side.       Femoral pulses are 2+ on the right side and 2+ on the left side.      Popliteal pulses are 2+ on the right side and 2+ on the left side.       Dorsalis pedis pulses are 0 on the right side and 2+ on the left side.       Posterior tibial pulses are 0 on the right side and 0 on the left side.     Heart sounds: Heart sounds are distant. Murmur heard.  Midsystolic murmur is present with a grade of 1/6 at the upper right sternal border.    No gallop.  Pulmonary:     Effort: Pulmonary effort is normal.     Breath sounds: Normal breath sounds.  Musculoskeletal:        General: No swelling.     Medications and allergies   Allergies  Allergen Reactions   Lisinopril Cough     Medication list after today's encounter    Current Outpatient Medications:    acetaminophen (TYLENOL) 500 MG tablet, Take 1,000 mg by mouth every 6 (six) hours as needed for moderate pain., Disp: , Rfl:    Ascorbic Acid (VITAMIN C) 1000 MG tablet, Take 1,000 mg by mouth daily., Disp: , Rfl:    atorvastatin (LIPITOR) 20 MG tablet, Take 20 mg by mouth daily., Disp: , Rfl:    dapagliflozin propanediol (FARXIGA) 10 MG TABS tablet, Take 10 mg by mouth daily., Disp: , Rfl:    furosemide (LASIX) 20 MG tablet, Take 20 mg by mouth daily as needed for edema., Disp: , Rfl:    glipiZIDE (GLUCOTROL XL) 2.5 MG 24 hr tablet, Take 5 mg by mouth daily with breakfast., Disp: , Rfl:    HYDROcodone-acetaminophen (NORCO/VICODIN) 5-325 MG tablet, Take 1 tablet by mouth every 6 (six) hours as needed for moderate pain., Disp: , Rfl:    LEVEMIR FLEXTOUCH 100 UNIT/ML FlexPen, Inject 60 Units into the skin every evening., Disp: , Rfl:    metFORMIN (GLUCOPHAGE) 500 MG tablet, Take 2,000 mg by mouth daily with breakfast., Disp: , Rfl:    metoprolol succinate (TOPROL-XL) 50 MG 24 hr tablet, Take 1 tablet (50 mg total) by mouth daily., Disp: 90 tablet, Rfl: 1   Multiple Vitamin (MULTIVITAMIN WITH MINERALS) TABS tablet, Take 1 tablet by mouth daily.,  Disp: , Rfl:    NON FORMULARY, Trimix, Disp: , Rfl:    Omega-3 Fatty Acids (FISH OIL) 1000 MG CAPS, Take 1,000 mg by mouth daily., Disp: , Rfl:    rivaroxaban (XARELTO) 20 MG TABS tablet, Take 1 tablet (20 mg total) by mouth daily. 08/04/13 last dose of xarelto in preparation for colonoscopy, Disp: 30 tablet, Rfl:    sacubitril-valsartan (ENTRESTO) 49-51 MG, Take 1 tablet by mouth 2 (two) times daily., Disp: 60 tablet, Rfl: 1   Vitamin D, Ergocalciferol, (DRISDOL) 1.25 MG (50000 UNIT) CAPS capsule,  Take 50,000 Units by mouth every 7 (seven) days., Disp: , Rfl:   Current Outpatient Medications  Medication Instructions   acetaminophen (TYLENOL) 1,000 mg, Oral, Every 6 hours PRN   atorvastatin (LIPITOR) 20 mg, Oral, Daily   dapagliflozin propanediol (FARXIGA) 10 mg, Daily   Fish Oil 1,000 mg, Oral, Daily   furosemide (LASIX) 20 mg, Oral, Daily PRN   glipiZIDE (GLUCOTROL XL) 5 mg, Oral, Daily with breakfast   HYDROcodone-acetaminophen (NORCO/VICODIN) 5-325 MG tablet 1 tablet, Oral, Every 6 hours PRN   Levemir FlexTouch 60 Units, Subcutaneous, Every evening   metFORMIN (GLUCOPHAGE) 2,000 mg, Oral, Daily with breakfast   metoprolol succinate (TOPROL-XL) 50 mg, Oral, Daily   Multiple Vitamin (MULTIVITAMIN WITH MINERALS) TABS tablet 1 tablet, Oral, Daily   NON FORMULARY Trimix   rivaroxaban (XARELTO) 20 mg, Oral, Daily, 08/04/13 last dose of xarelto in preparation for colonoscopy   sacubitril-valsartan (ENTRESTO) 49-51 MG 1 tablet, Oral, 2 times daily   vitamin C 1,000 mg, Oral, Daily   Vitamin D (Ergocalciferol) (DRISDOL) 50,000 Units, Oral, Every 7 days    Laboratory examination:   Recent Labs    12/26/20 0806 06/25/21 0910 06/25/21 0910 08/27/21 0952 08/27/21 1359 08/27/21 1402 08/27/21 1403 08/27/21 1412  NA  --  143   < > 139   < > 145 145 142  K  --  4.1   < > 4.4   < > 3.7 3.7 4.0  CL  --  111  --  108  --   --   --   --   CO2  --  26  --  22  --   --   --   --   GLUCOSE  --   104*  --  193*  --   --   --   --   BUN  --  20  --  18  --   --   --   --   CREATININE 1.00 1.09  --  1.09  --   --   --   --   CALCIUM  --  9.4  --  9.5  --   --   --   --   GFRNONAA  --  >60  --  >60  --   --   --   --    < > = values in this interval not displayed.   CrCl cannot be calculated (Patient's most recent lab result is older than the maximum 21 days allowed.).  CMP Latest Ref Rng & Units 08/27/2021 08/27/2021 08/27/2021  Glucose 70 - 99 mg/dL - - -  BUN 8 - 23 mg/dL - - -  Creatinine 0.61 - 1.24 mg/dL - - -  Sodium 135 - 145 mmol/L 142 145 145  Potassium 3.5 - 5.1 mmol/L 4.0 3.7 3.7  Chloride 98 - 111 mmol/L - - -  CO2 22 - 32 mmol/L - - -  Calcium 8.9 - 10.3 mg/dL - - -  Total Protein 6.1 - 8.1 g/dL - - -  Total Bilirubin 0.3 - 1.2 mg/dL - - -  Alkaline Phos 38 - 126 U/L - - -  AST 15 - 41 U/L - - -  ALT 17 - 63 U/L - - -   CBC Latest Ref Rng & Units 08/27/2021 08/27/2021 08/27/2021  WBC 4.0 - 10.5 K/uL - - -  Hemoglobin 13.0 - 17.0 g/dL 12.6(L) 12.2(L) 12.2(L)  Hematocrit 39.0 - 52.0 % 37.0(L) 36.0(L) 36.0(L)  Platelets 150 - 400 K/uL - - -      HEMOGLOBIN A1C Lab Results  Component Value Date   HGBA1C 7.4 (H) 11/01/Case   MPG 165.68 11/01/Case     Laboratory examination:   External labs:   Cholesterol, total 131.000 m 02/28/2020 HDL 30 MG/DL 02/28/2020 LDL 36.000 mg 02/28/2020 Triglycerides 326.000 02/28/2020  A1C 7.400 % 11/1/Case  TSH 1.270 02/22/2019   Radiology   CT angiogram chest 12/16/2017: No evidence of pulmonary embolus. Dependent atelectasis. Cardiovascular: No filling defects in the pulmonary arteries to suggest pulmonary emboli. Insert Heart diffuse coronary artery calcifications. Scattered aortic calcifications. Extensive coronary artery calcifications/disease.  CT Angiogram of the abdomen with bilateral lower extremity angiogram 5/4/Case: Aortic atherosclerosis.  Aortic Atherosclerosis (ICD10-I70.0).   Minimal aortoiliac disease with no  high-grade stenosis or occlusion. Hypogastric arteries remain patent as well as the pelvic arteries.  AAA.   Relatively symmetric mild atherosclerosis of the femoropopliteal segment.   Bilateral tibial and pedal circumferential calcifications, limiting evaluation of the tibial artery secondary to blooming artifact from the calcium.  Cardiac Studies:   Lower extremity venous duplex 12/03/2017:  Venous duplex: Right: There is no evidence of deep vein thrombosis in the lower extremity.There is no evidence of superficial venous thrombosis. No cystic structure found in the popliteal fossa. Left: There is a chronic deep vein thrombosis noted in one of the paired posterior tibial veins in the mid calf. All other deep vein thrombus noted in the 2013 exam appear resolved. There is no evidence of superficial venous thrombosis. No cystic structure found in the popliteal fossa.  Lexiscan myoview stress test 12/25/2017: 1. Lexiscan stress test performed. Exercise capacity was not assessed. Stress symptoms included dyspnea, dizziness. Peak BP 154/96 mmHg. The resting electrocardiogram demonstrated normal sinus rhythm, normal resting conduction, VPC and normal rest repolarization. Stress EKG is non diagnostic for ischemia as it is a pharmacologic stress. 2. The overall quality of the study is excellent. There is no evidence of abnormal lung activity. Stress and rest SPECT images demonstrate homogeneous tracer distribution throughout the myocardium. Gated SPECT imaging reveals normal myocardial thickening and wall motion. The left ventricular ejection fraction was normal (55%). Small apical defect likely related to subdiaphragmatic attenuation. 3. Low risk study.  ABI 4/20/Case: Right: Resting right ankle-brachial index indicates noncompressible right lower extremity arteries. The right toe-brachial index is abnormal.  Pedal artery waveforms indicate adequate flow at rest.   Left: Resting left  ankle-brachial index indicates noncompressible left lower extremity arteries. The left toe-brachial index is normal.  Pedal artery waveforms indicate adequate flow at rest.   PCV ECHOCARDIOGRAM COMPLETE 10/04/Case  Narrative Echocardiogram 10/4/Case: Left ventricle cavity is normal in size. Moderate concentric hypertrophy of the left ventricle. Abnormal septal wall motion due to left bundle branch block. Severe global hypokinesis. LVEF 25-30%. Doppler evidence of grade I (impaired) diastolic dysfunction, normal LAP. Bicuspid aortic valve raphe between noncoronary and left cusp. Mild calcification. Mild aortic valve stenosis. Vmax 2.2 m/sec, mean PG 10 mmHg, AVA 1.7 cm by continuity equation No regurgitation. Mild (Grade I) mitral regurgitation. Mild tricuspid regurgitation. No evidence of pulmonary hypertension. Compared to previous study in 1478, LV systolic dysfunction is new.  Right and left heart catheterization 08/27/2021: RA 4/3, mean 2 mmHg RV 27/0, EDP 4 mmHg PA 30/10, mean 17 mmHg PW 11/11, mean 9 mmHg. LV 151/8, EDP 18 mmHg.  Ao 145/74, mean 99 mmHg.  No significant pressure gradient across the aortic valve. LVEF  30 to 35% with global  hypokinesis.  No significant mitral regurgitation. LM: Mildly calcified but normal. LAD: Large vessel, mild to moderately calcified, there are tandem 40% stenosis in the midsegment due to calcification but without evidence of high-grade stenosis.  Large D1 and D2. CX: Large vessel.  Mild diffuse disease.  Large OM1. RI: Large vessel.  Smooth and normal. RCA: Very large caliber vessel.  Mild diffuse calcification noted.  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  60 mL contrast utilized.    EKG:     EKG 11/30/Case: Sinus rhythm with first-degree AV block at rate of 66 bpm, left bundle branch block.  No further analysis.  Compared to 06/20/2020, LBBB is  new.  Assessment     ICD-10-CM   1. Chronic systolic heart failure (HCC)  I50.22     2. Non-ischemic cardiomyopathy ([place])  I42.8     3. LBBB (left bundle branch block)  I44.7     4. Essential hypertension  I10       There are no discontinued medications.  No orders of the defined types were placed in this encounter.  No orders of the defined types were placed in this encounter.  Recommendations:   KEON PENDER is a 71 y.o.  Caucasian male with history of PE in 2013 after a long travel and a spontaneous DVT in 2009, now on chronic Xarelto, diabetes mellitus, rheumatoid and psoriatic arthritis, ankylosing spondylitis, hypertension, mild hyperlipidemia, bicuspid aortic valve, PAD with small vessel disease due to uncontrolled diabetes mellitus, who had new onset left bundle branch block and Anthony cardiomyopathy with severe LV systolic dysfunction in October Case, new onset LBBB, right and left heart catheterization on 09/16/2021 essentially revealing nonischemic dilated cardiomyopathy.  Anthony Case, Anthony had upper respiratory infections as well.  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  Anthony is tolerating moderate dose of Entresto and Anthony metoprolol 50 mg daily, blood pressures soft, heart rate is well controlled.  In view of this we will increase Entresto to 97/103 mg, Anthony will try to uptitrate moderate dose to 1-1/2 tablet twice daily and eventually 2 tablets twice daily and let me know so I can send a new Rx for higher dose.  We will repeat echocardiogram probably in 3 months or so and depending upon his symptoms and EF could consider BiV pacing versus BiV ICD.  Weight loss again discussed with the patient.  I will see him back in 6 weeks.   Adrian Prows, MD, [facility] 10/03/2021, 7:08 AM Office: [phone] Fax: [phone] Pager: [phone]

## 2021-10-03 DIAGNOSIS — Z125 Encounter for screening for malignant neoplasm of prostate: Secondary | ICD-10-CM | POA: Diagnosis not present

## 2021-10-03 DIAGNOSIS — E785 Hyperlipidemia, unspecified: Secondary | ICD-10-CM | POA: Diagnosis not present

## 2021-10-03 DIAGNOSIS — M810 Age-related osteoporosis without current pathological fracture: Secondary | ICD-10-CM | POA: Diagnosis not present

## 2021-10-03 DIAGNOSIS — E1140 Type 2 diabetes mellitus with diabetic neuropathy, unspecified: Secondary | ICD-10-CM | POA: Diagnosis not present

## 2021-10-03 DIAGNOSIS — I10 Essential (primary) hypertension: Secondary | ICD-10-CM | POA: Diagnosis not present

## 2021-10-04 DIAGNOSIS — L4050 Arthropathic psoriasis, unspecified: Secondary | ICD-10-CM | POA: Diagnosis not present

## 2021-10-08 DIAGNOSIS — M150 Primary generalized (osteo)arthritis: Secondary | ICD-10-CM | POA: Diagnosis not present

## 2021-10-08 DIAGNOSIS — S22080A Wedge compression fracture of T11-T12 vertebra, initial encounter for closed fracture: Secondary | ICD-10-CM | POA: Diagnosis not present

## 2021-10-08 DIAGNOSIS — E669 Obesity, unspecified: Secondary | ICD-10-CM | POA: Diagnosis not present

## 2021-10-08 DIAGNOSIS — L4050 Arthropathic psoriasis, unspecified: Secondary | ICD-10-CM | POA: Diagnosis not present

## 2021-10-08 DIAGNOSIS — I5020 Unspecified systolic (congestive) heart failure: Secondary | ICD-10-CM | POA: Diagnosis not present

## 2021-10-08 DIAGNOSIS — L409 Psoriasis, unspecified: Secondary | ICD-10-CM | POA: Diagnosis not present

## 2021-10-08 DIAGNOSIS — Z79899 Other long term (current) drug therapy: Secondary | ICD-10-CM | POA: Diagnosis not present

## 2021-10-08 DIAGNOSIS — Z6833 Body mass index (BMI) 33.0-33.9, adult: Secondary | ICD-10-CM | POA: Diagnosis not present

## 2021-10-08 DIAGNOSIS — R7989 Other specified abnormal findings of blood chemistry: Secondary | ICD-10-CM | POA: Diagnosis not present

## 2021-10-09 ENCOUNTER — Ambulatory Visit: Payer: Medicare Other | Admitting: Cardiology

## 2021-10-10 DIAGNOSIS — L4050 Arthropathic psoriasis, unspecified: Secondary | ICD-10-CM | POA: Diagnosis not present

## 2021-10-10 DIAGNOSIS — Z1331 Encounter for screening for depression: Secondary | ICD-10-CM | POA: Diagnosis not present

## 2021-10-10 DIAGNOSIS — I5022 Chronic systolic (congestive) heart failure: Secondary | ICD-10-CM | POA: Diagnosis not present

## 2021-10-10 DIAGNOSIS — Z1339 Encounter for screening examination for other mental health and behavioral disorders: Secondary | ICD-10-CM | POA: Diagnosis not present

## 2021-10-10 DIAGNOSIS — Z0000 Encounter for general adult medical examination without abnormal findings: Secondary | ICD-10-CM | POA: Diagnosis not present

## 2021-10-10 DIAGNOSIS — R2681 Unsteadiness on feet: Secondary | ICD-10-CM | POA: Diagnosis not present

## 2021-10-10 DIAGNOSIS — E1140 Type 2 diabetes mellitus with diabetic neuropathy, unspecified: Secondary | ICD-10-CM | POA: Diagnosis not present

## 2021-10-10 DIAGNOSIS — Z7901 Long term (current) use of anticoagulants: Secondary | ICD-10-CM | POA: Diagnosis not present

## 2021-10-10 DIAGNOSIS — Z794 Long term (current) use of insulin: Secondary | ICD-10-CM | POA: Diagnosis not present

## 2021-10-10 DIAGNOSIS — F5221 Male erectile disorder: Secondary | ICD-10-CM | POA: Diagnosis not present

## 2021-10-10 DIAGNOSIS — R972 Elevated prostate specific antigen [PSA]: Secondary | ICD-10-CM | POA: Diagnosis not present

## 2021-10-10 DIAGNOSIS — I10 Essential (primary) hypertension: Secondary | ICD-10-CM | POA: Diagnosis not present

## 2021-10-10 DIAGNOSIS — M6281 Muscle weakness (generalized): Secondary | ICD-10-CM | POA: Diagnosis not present

## 2021-10-11 ENCOUNTER — Other Ambulatory Visit: Payer: Self-pay

## 2021-10-11 MED ORDER — ENTRESTO 97-103 MG PO TABS: 1.0000 | ORAL_TABLET | Freq: Two times a day (BID) | ORAL | 1 refills | Status: DC

## 2021-10-14 DIAGNOSIS — R82998 Other abnormal findings in urine: Secondary | ICD-10-CM | POA: Diagnosis not present

## 2021-10-14 DIAGNOSIS — Z1212 Encounter for screening for malignant neoplasm of rectum: Secondary | ICD-10-CM | POA: Diagnosis not present

## 2021-10-22 DIAGNOSIS — I10 Essential (primary) hypertension: Secondary | ICD-10-CM | POA: Diagnosis not present

## 2021-10-22 DIAGNOSIS — I700 Atherosclerosis of aorta: Secondary | ICD-10-CM | POA: Diagnosis not present

## 2021-10-22 DIAGNOSIS — E785 Hyperlipidemia, unspecified: Secondary | ICD-10-CM | POA: Diagnosis not present

## 2021-10-22 DIAGNOSIS — M810 Age-related osteoporosis without current pathological fracture: Secondary | ICD-10-CM | POA: Diagnosis not present

## 2021-10-28 DIAGNOSIS — Z7901 Long term (current) use of anticoagulants: Secondary | ICD-10-CM | POA: Diagnosis not present

## 2021-10-28 DIAGNOSIS — M25571 Pain in right ankle and joints of right foot: Secondary | ICD-10-CM | POA: Diagnosis not present

## 2021-10-28 DIAGNOSIS — I5022 Chronic systolic (congestive) heart failure: Secondary | ICD-10-CM | POA: Diagnosis not present

## 2021-10-28 DIAGNOSIS — M25471 Effusion, right ankle: Secondary | ICD-10-CM | POA: Diagnosis not present

## 2021-10-28 DIAGNOSIS — I2699 Other pulmonary embolism without acute cor pulmonale: Secondary | ICD-10-CM | POA: Diagnosis not present

## 2021-10-31 ENCOUNTER — Telehealth: Payer: Self-pay

## 2021-10-31 ENCOUNTER — Other Ambulatory Visit: Payer: Self-pay

## 2021-10-31 DIAGNOSIS — I5022 Chronic systolic (congestive) heart failure: Secondary | ICD-10-CM

## 2021-10-31 MED ORDER — ENTRESTO 97-103 MG PO TABS: 1.0000 | ORAL_TABLET | Freq: Two times a day (BID) | ORAL | 3 refills | Status: DC

## 2021-10-31 NOTE — Telephone Encounter (Signed)
error 

## 2021-11-05 ENCOUNTER — Other Ambulatory Visit: Payer: Self-pay | Admitting: Student

## 2021-11-05 MED ORDER — ENTRESTO 97-103 MG PO TABS: 1.0000 | ORAL_TABLET | Freq: Two times a day (BID) | ORAL | 3 refills | Status: DC

## 2021-11-07 DIAGNOSIS — L4050 Arthropathic psoriasis, unspecified: Secondary | ICD-10-CM | POA: Diagnosis not present

## 2021-11-08 ENCOUNTER — Other Ambulatory Visit: Payer: Self-pay

## 2021-11-08 ENCOUNTER — Emergency Department (HOSPITAL_COMMUNITY)
Admission: EM | Admit: 2021-11-08 | Discharge: 2021-11-08 | Disposition: A | Discharge status: Home / Self Care | Payer: Medicare Other | Attending: Emergency Medicine | Admitting: Emergency Medicine

## 2021-11-08 ENCOUNTER — Encounter (HOSPITAL_COMMUNITY): Payer: Self-pay

## 2021-11-08 DIAGNOSIS — I110 Hypertensive heart disease with heart failure: Secondary | ICD-10-CM | POA: Diagnosis not present

## 2021-11-08 DIAGNOSIS — Z79899 Other long term (current) drug therapy: Secondary | ICD-10-CM | POA: Insufficient documentation

## 2021-11-08 DIAGNOSIS — Z7984 Long term (current) use of oral hypoglycemic drugs: Secondary | ICD-10-CM | POA: Insufficient documentation

## 2021-11-08 DIAGNOSIS — R799 Abnormal finding of blood chemistry, unspecified: Secondary | ICD-10-CM | POA: Insufficient documentation

## 2021-11-08 DIAGNOSIS — I10 Essential (primary) hypertension: Secondary | ICD-10-CM | POA: Diagnosis not present

## 2021-11-08 DIAGNOSIS — R899 Unspecified abnormal finding in specimens from other organs, systems and tissues: Secondary | ICD-10-CM

## 2021-11-08 DIAGNOSIS — I509 Heart failure, unspecified: Secondary | ICD-10-CM | POA: Diagnosis not present

## 2021-11-08 DIAGNOSIS — Z794 Long term (current) use of insulin: Secondary | ICD-10-CM | POA: Insufficient documentation

## 2021-11-08 DIAGNOSIS — E1165 Type 2 diabetes mellitus with hyperglycemia: Secondary | ICD-10-CM | POA: Diagnosis not present

## 2021-11-08 LAB — CBC WITH DIFFERENTIAL/PLATELET
Abs Immature Granulocytes: 0.05 10*3/uL (ref 0.00–0.07)
Basophils Absolute: 0.1 10*3/uL (ref 0.0–0.1)
Basophils Relative: 1 %
Eosinophils Absolute: 0.2 10*3/uL (ref 0.0–0.5)
Eosinophils Relative: 3 %
HCT: 45.7 % (ref 39.0–52.0)
Hemoglobin: 15.2 g/dL (ref 13.0–17.0)
Immature Granulocytes: 1 %
Lymphocytes Relative: 22 %
Lymphs Abs: 1.7 10*3/uL (ref 0.7–4.0)
MCH: 30.4 pg (ref 26.0–34.0)
MCHC: 33.3 g/dL (ref 30.0–36.0)
MCV: 91.4 fL (ref 80.0–100.0)
Monocytes Absolute: 0.6 10*3/uL (ref 0.1–1.0)
Monocytes Relative: 8 %
Neutro Abs: 5.1 10*3/uL (ref 1.7–7.7)
Neutrophils Relative %: 65 %
Platelets: 254 10*3/uL (ref 150–400)
RBC: 5 MIL/uL (ref 4.22–5.81)
RDW: 13.8 % (ref 11.5–15.5)
WBC: 7.7 10*3/uL (ref 4.0–10.5)
nRBC: 0 % (ref 0.0–0.2)

## 2021-11-08 LAB — COMPREHENSIVE METABOLIC PANEL
ALT: 25 U/L (ref 0–44)
AST: 27 U/L (ref 15–41)
Albumin: 3.7 g/dL (ref 3.5–5.0)
Alkaline Phosphatase: 47 U/L (ref 38–126)
Anion gap: 8 (ref 5–15)
BUN: 23 mg/dL (ref 8–23)
CO2: 24 mmol/L (ref 22–32)
Calcium: 9.5 mg/dL (ref 8.9–10.3)
Chloride: 108 mmol/L (ref 98–111)
Creatinine, Ser: 1.12 mg/dL (ref 0.61–1.24)
GFR, Estimated: >60 mL/min (ref >60)
Glucose, Bld: 179 mg/dL — ABNORMAL HIGH (ref 70–99)
Potassium: 4.7 mmol/L (ref 3.5–5.1)
Sodium: 140 mmol/L (ref 135–145)
Total Bilirubin: 1.2 mg/dL (ref 0.3–1.2)
Total Protein: 6.6 g/dL (ref 6.5–8.1)

## 2021-11-08 LAB — MAGNESIUM: Magnesium: 1.9 mg/dL (ref 1.7–2.4)

## 2021-11-08 NOTE — ED Provider Notes (Signed)
?[facility] ?Provider Note ? ? ?CSN: [id number] ?Arrival date & time: 11/08/21  1312 ? ?  ? ?History ? ?Chief Complaint  ?Patient presents with  ? abnormal labs  ? ? ?Anthony Case is a 71 y.o. male with past history significant for diabetes, hyperlipidemia, hypertension, recent diagnosis of congestive heart failure who reports that he has been started on Entresto recently.  He reports from home after having lab work done at his rheumatologist, and was called and told that his potassium was 6.4.  Patient denies any chest pain, shortness of breath, palpitations, cramping, weakness, fatigue, GI upset.  He does not have any history of prior hyperkalemia.  He is not taking spironolactone. ? ?HPI ? ?  ? ?Home Medications ?Prior to Admission medications   ?Medication Sig Start Date End Date Taking? Authorizing Provider  ?acetaminophen (TYLENOL) 500 MG tablet Take 1,000 mg by mouth every 6 (six) hours as needed for moderate pain.    [provider]  ?Ascorbic Acid (VITAMIN C) 1000 MG tablet Take 1,000 mg by mouth daily.    [provider]  ?atorvastatin (LIPITOR) 20 MG tablet Take 20 mg by mouth daily.    [provider]  ?dapagliflozin propanediol (FARXIGA) 10 MG TABS tablet Take 10 mg by mouth daily.    [provider]  ?furosemide (LASIX) 20 MG tablet Take 20 mg by mouth daily as needed for edema.    [provider]  ?glipiZIDE (GLUCOTROL XL) 2.5 MG 24 hr tablet Take 5 mg by mouth daily with breakfast. 02/23/19   [provider]  ?HYDROcodone-acetaminophen (NORCO/VICODIN) 5-325 MG tablet Take 1 tablet by mouth every 6 (six) hours as needed for moderate pain.    [provider]  ?LEVEMIR FLEXTOUCH 100 UNIT/ML FlexPen Inject 60 Units into the skin every evening. 02/04/20   [provider]  ?metFORMIN (GLUCOPHAGE) 500 MG tablet Take 2,000 mg by mouth daily with breakfast.    [provider]  ?metoprolol  succinate (TOPROL-XL) 50 MG 24 hr tablet Take 1 tablet (50 mg total) by mouth daily. 09/09/21   Adrian Prows, MD  ?Multiple Vitamin (MULTIVITAMIN WITH MINERALS) TABS tablet Take 1 tablet by mouth daily.    [provider]  ?NON FORMULARY Trimix    [provider]  ?Omega-3 Fatty Acids (FISH OIL) 1000 MG CAPS Take 1,000 mg by mouth daily.    [provider]  ?rivaroxaban (XARELTO) 20 MG TABS tablet Take 1 tablet (20 mg total) by mouth daily. 08/04/13 last dose of xarelto in preparation for colonoscopy 08/28/21   Adrian Prows, MD  ?sacubitril-valsartan (ENTRESTO) 97-103 MG Take 1 tablet by mouth 2 (two) times daily. 11/05/21   Cantwell, Celeste C, PA-C  ?Vitamin D, Ergocalciferol, (DRISDOL) 1.25 MG (50000 UNIT) CAPS capsule Take 50,000 Units by mouth every 7 (seven) days.    [provider]  ?   ? ?Allergies    ?Lisinopril   ? ?Review of Systems   ?Review of Systems  ?All other systems reviewed and are negative. ? ?Physical Exam ?Updated Vital Signs ?BP 115/76   Pulse 71   Temp 97.7 ?F (36.5 ?C) (Oral)   Resp 18   Ht '5\' 11"'$  (1.803 m)   Wt 115.2 kg   SpO2 99%   BMI 35.43 kg/m?  ?Physical Exam ?Vitals and nursing note reviewed.  ?Constitutional:   ?   General: He is not in acute distress. ?   Appearance: Normal appearance.  ?HENT:  ?  Head: Normocephalic and atraumatic.  ?Eyes:  ?   General:     ?   Right eye: No discharge.     ?   Left eye: No discharge.  ?Cardiovascular:  ?   Rate and Rhythm: Normal rate and regular rhythm.  ?Pulmonary:  ?   Effort: Pulmonary effort is normal. No respiratory distress.  ?Musculoskeletal:     ?   General: No deformity.  ?Skin: ?   General: Skin is warm and dry.  ?Neurological:  ?   Mental Status: He is alert and oriented to person, place, and time.  ?Psychiatric:     ?   Mood and Affect: Mood normal.     ?   Behavior: Behavior normal.  ? ? ?ED Results / Procedures / Treatments   ?Labs ?(all labs ordered are listed, but only abnormal results are  displayed) ?Labs Reviewed  ?COMPREHENSIVE METABOLIC PANEL - Abnormal; Notable for the following components:  ?    Result Value  ? Glucose, Bld 179 (*)   ? All other components within normal limits  ?CBC WITH DIFFERENTIAL/PLATELET  ?MAGNESIUM  ? ? ?EKG ?EKG Interpretation ? ?Date/Time:  Friday November 08 2021 13:45:23 EDT ?Ventricular Rate:  80 ?PR Interval:  238 ?QRS Duration: 152 ?QT Interval:  440 ?QTC Calculation: 507 ?R Axis:   -24 ?Text Interpretation: Sinus rhythm with 1st degree A-V block with occasional Premature ventricular complexes Left bundle branch block Abnormal ECG When compared with ECG of 27-Aug-2021 10:36, PREVIOUS ECG IS PRESENT when compared to prior, similar appearance. No STEMI Confirmed by Antony Blackbird [id number]) on 11/08/2021 2:05:33 PM ? ?Radiology ?No results found. ? ?Procedures ?Procedures  ? ? ?Medications Ordered in ED ?Medications - No data to display ? ?ED Course/ Medical Decision Making/ A&P ?  ?                        ?Medical Decision Making ? ?Very well-appearing 71 year old male who was sent from his rheumatologist for concern of hyperkalemia.  He has no history of same.  He is not taking spironolactone.  Emergent differential diagnosis includes acute cardiac arrhythmia and death.  Patient denies any chest pain, shortness of breath, palpitations, cramps, GI upset. ? ?I personally interpreted, and discussed EKG with my attending who additionally interpreted this EKG which shows left bundle branch block. EKG with similar appearance to previous. ? ?We will perform lab work which shows normal magnesium.  Unremarkable CBC.  CMP with no hyperkalemia, potassium 4.7 today I am suspicious that the sample from rheumatologist was hemolyzed.  He does have a mild hyperglycemia glucose 179.  He reports that he did not eat a normal diet for him last night, with known history of diabetes this makes sense.  Patient continues deny any chest pain, shortness of breath, fatigue, lightheadedness,  palpitations.  He appears stable for discharge at this time.  Encourage follow-up with PCP, rheumatology. ?Final diagnoses:  ?Abnormal laboratory test  ? ? ?Rx / DC Orders ?ED Discharge Orders   ? ? None  ? ?  ? ? ?  ?Anselmo Pickler, PA-C ?11/08/21 1536 ? ?  ?Tegeler, Gwenyth Allegra, MD ?11/08/21 1637 ? ?

## 2021-11-08 NOTE — ED Provider Triage Note (Signed)
Emergency Medicine Provider Triage Evaluation Note ? ?Anthony Case , a 71 y.o. male  was evaluated in triage.  Pt complains of hyperkalemia. ?He states that he was seen by his doctor yesterday for a infusion for his rheumatoid and had labs drawn.  He reports that he was called today that his potassium was 6.4.  He states he has recently been diagnosed with CHF and is started on Entresto.  He has not had issues with hyperkalemia before, does not take prescription potassium. ? ? ?Physical Exam  ?BP 113/75 (BP Location: Right Arm)   Pulse 72   Temp 97.9 ?F (36.6 ?C)   Resp 20   Ht '5\' 11"'$  (1.803 m)   Wt 115.2 kg   SpO2 98%   BMI 35.43 kg/m?  ?Gen:   Awake, no distress   ?Resp:  Normal effort  ?MSK:   Moves extremities without difficulty  ?Other:  Awake and alert, no distress ? ?Medical Decision Making  ?Medically screening exam initiated at 2:01 PM.  Appropriate orders placed.  Anthony Case was informed that the remainder of the evaluation will be completed by another provider, this initial triage assessment does not replace that evaluation, and the importance of remaining in the ED until their evaluation is complete. ? ?Patient reports that yesterday his potassium was 6.4.  Charge was called due to abnormal appearing EKG and patient was placed in room. ?  ?Lorin Glass, PA-C ?11/08/21 1404 ? ?

## 2021-11-08 NOTE — Discharge Instructions (Signed)
As we discussed I have no reason to suspect that you do have hyperkalemia at this time.  Please follow-up with your primary care for further evaluation.  It was a pleasure taking care of you today, please return to the emergency department if you have any questions or concerns. ?

## 2021-11-08 NOTE — ED Triage Notes (Signed)
Pt arrived POV from home c/o high potassium levels. Pt was seen and had lab work done at his PCP and they called and said his potassium was 6.4. PT denies any pain at this time.  ?

## 2021-11-14 ENCOUNTER — Encounter: Payer: Self-pay | Admitting: Cardiology

## 2021-11-14 ENCOUNTER — Other Ambulatory Visit: Payer: Self-pay

## 2021-11-14 ENCOUNTER — Ambulatory Visit: Payer: Medicare Other | Admitting: Cardiology

## 2021-11-14 VITALS — BP 108/65 | HR 68 | Temp 97.5°F | Resp 16 | Ht 71.0 in | Wt 256.0 lb

## 2021-11-14 DIAGNOSIS — I428 Other cardiomyopathies: Secondary | ICD-10-CM | POA: Diagnosis not present

## 2021-11-14 DIAGNOSIS — I5022 Chronic systolic (congestive) heart failure: Secondary | ICD-10-CM

## 2021-11-14 DIAGNOSIS — I10 Essential (primary) hypertension: Secondary | ICD-10-CM

## 2021-11-14 NOTE — Progress Notes (Signed)
? ?Primary Physician/Referring:  Donnajean Lopes, MD ? ?Patient ID: Anthony Case, male    DOB: 1951-07-02, 71 y.o.   MRN: [id number] ? ?Chief Complaint  ?Patient presents with  ? Chronic systolic heart failure   ? Follow-up  ? ?HPI:   ? ?Anthony Case  is a 71 y.o. Caucasian male with history of PE in 2013 after a long travel and a spontaneous DVT in 2009, now on chronic Xarelto, diabetes mellitus, rheumatoid and psoriatic arthritis, ankylosing spondylitis, hypertension, mild hyperlipidemia, bicuspid aortic valve, PAD with small vessel disease due to uncontrolled diabetes mellitus, who had new onset left bundle branch block and also cardiomyopathy with severe LV systolic dysfunction in October 2022,  right and left heart catheterization performed on 09/16/2021 essentially revealing nonischemic dilated cardiomyopathy.  He has had COVID in June 2022, also had upper respiratory infections as well.    ? ?He presents for a 6-week follow-up of nonischemic dilated cardiomyopathy and chronic systolic heart failure.  He is presently tolerating max dose Entresto and also high-dose of beta-blocker therapy but has occasional dizziness.   He has no specific complaints today.  He was seen in the emergency room for hypokalemia evaluation after Dr. Amil Amen had done a BMP but fortunately renal function and potassium levels were normal. ? ?He does have peripheral arterial disease but denies symptoms of claudication.  But does admit to not being physically active but plans on starting swimming again. ? ?Past Medical History:  ?Diagnosis Date  ? Arthritis   ? Diabetes mellitus   ? Hx pulmonary embolism 08/26/2007  ? required emergent tPA treatment  ? Hypertension   ? Shortness of breath   ? Sleep apnea   ? ?Social History  ? ?Tobacco Use  ? Smoking status: Never  ? Smokeless tobacco: Never  ?Substance Use Topics  ? Alcohol use: Not Currently  ?  Comment: Previously drinking 3-5 drinks nightly x 20 year  ? ?Marital Status:  Married  ?ROS  ?Review of Systems  ?Cardiovascular:  Negative for chest pain, claudication, dyspnea on exertion and leg swelling.  ?Neurological:  Positive for paresthesias (bilateral feet).  ? ?Objective  ?Blood pressure 108/65, pulse 68, temperature (!) 97.5 ?F (36.4 ?C), temperature source Temporal, resp. rate 16, height '5\' 11"'$  (1.803 m), weight 256 lb (116.1 kg), SpO2 97 %. Body mass index is 35.7 kg/m?.  ? ?  11/14/2021  ?  3:09 PM 11/08/2021  ?  2:58 PM 11/08/2021  ?  1:44 PM  ?Vitals with BMI  ?Height '5\' 11"'$   '5\' 11"'$   ?Weight 256 lbs  254 lbs  ?BMI 35.72  35.44  ?Systolic 419 379   ?Diastolic 65 76   ?Pulse 68 71   ?  ?Physical Exam ?Neck:  ?   Vascular: No carotid bruit or JVD.  ?Cardiovascular:  ?   Rate and Rhythm: Normal rate and regular rhythm.  ?   Pulses:     ?     Carotid pulses are 2+ on the right side and 2+ on the left side. ?     Femoral pulses are 2+ on the right side and 2+ on the left side. ?     Popliteal pulses are 2+ on the right side and 2+ on the left side.  ?     Dorsalis pedis pulses are 0 on the right side and 2+ on the left side.  ?     Posterior tibial pulses are 0 on the right side and  0 on the left side.  ?   Heart sounds: Heart sounds are distant. No murmur heard. ?  No gallop.  ?Pulmonary:  ?   Effort: Pulmonary effort is normal.  ?   Breath sounds: Normal breath sounds.  ?Musculoskeletal:     ?   General: No swelling.  ?  ? ?Medications and allergies  ? ?Allergies  ?Allergen Reactions  ? Lisinopril Cough  ?  ?Medication list after today's encounter  ? ? ?Current Outpatient Medications:  ?  acetaminophen (TYLENOL) 500 MG tablet, Take 1,000 mg by mouth every 6 (six) hours as needed for moderate pain., Disp: , Rfl:  ?  Ascorbic Acid (VITAMIN C) 1000 MG tablet, Take 1,000 mg by mouth daily., Disp: , Rfl:  ?  atorvastatin (LIPITOR) 20 MG tablet, Take 20 mg by mouth daily., Disp: , Rfl:  ?  dapagliflozin propanediol (FARXIGA) 10 MG TABS tablet, Take 10 mg by mouth daily., Disp: , Rfl:  ?   furosemide (LASIX) 20 MG tablet, Take 20 mg by mouth daily as needed for edema., Disp: , Rfl:  ?  glipiZIDE (GLUCOTROL XL) 2.5 MG 24 hr tablet, Take 5 mg by mouth daily with breakfast., Disp: , Rfl:  ?  HYDROcodone-acetaminophen (NORCO/VICODIN) 5-325 MG tablet, Take 1 tablet by mouth every 6 (six) hours as needed for moderate pain., Disp: , Rfl:  ?  LEVEMIR FLEXTOUCH 100 UNIT/ML FlexPen, Inject 60 Units into the skin every evening., Disp: , Rfl:  ?  metFORMIN (GLUCOPHAGE) 500 MG tablet, Take 2,000 mg by mouth daily with breakfast., Disp: , Rfl:  ?  metoprolol succinate (TOPROL-XL) 50 MG 24 hr tablet, Take 1 tablet (50 mg total) by mouth daily., Disp: 90 tablet, Rfl: 1 ?  Multiple Vitamin (MULTIVITAMIN WITH MINERALS) TABS tablet, Take 1 tablet by mouth daily., Disp: , Rfl:  ?  NON FORMULARY, Trimix, Disp: , Rfl:  ?  Omega-3 Fatty Acids (FISH OIL) 1000 MG CAPS, Take 1,000 mg by mouth daily., Disp: , Rfl:  ?  rivaroxaban (XARELTO) 20 MG TABS tablet, Take 1 tablet (20 mg total) by mouth daily. 08/04/13 last dose of xarelto in preparation for colonoscopy, Disp: 30 tablet, Rfl:  ?  sacubitril-valsartan (ENTRESTO) 97-103 MG, Take 1 tablet by mouth 2 (two) times daily., Disp: 180 tablet, Rfl: 3 ?  Vitamin D, Ergocalciferol, (DRISDOL) 1.25 MG (50000 UNIT) CAPS capsule, Take 50,000 Units by mouth every 7 (seven) days., Disp: , Rfl:  ? ?Laboratory examination:  ? ?Recent Labs  ?  06/25/21 ?8315 08/27/21 ?1761 08/27/21 ?1359 08/27/21 ?1403 08/27/21 ?1412 11/08/21 ?1358  ?NA 143 139   < > 145 142 140  ?K 4.1 4.4   < > 3.7 4.0 4.7  ?CL 111 108  --   --   --  108  ?CO2 26 22  --   --   --  24  ?GLUCOSE 104* 193*  --   --   --  179*  ?BUN 20 18  --   --   --  23  ?CREATININE 1.09 1.09  --   --   --  1.12  ?CALCIUM 9.4 9.5  --   --   --  9.5  ?GFRNONAA >60 >60  --   --   --  >60  ? < > = values in this interval not displayed.  ? ?estimated creatinine clearance is 78.4 mL/min (by C-G formula based on SCr of 1.12 mg/dL).  ? ?  Latest  Ref Rng &  Units 11/08/2021  ?  1:58 PM 08/27/2021  ?  2:12 PM 08/27/2021  ?  2:03 PM  ?CMP  ?Glucose 70 - 99 mg/dL 179      ?BUN 8 - 23 mg/dL 23      ?Creatinine 0.61 - 1.24 mg/dL 1.12      ?Sodium 135 - 145 mmol/L 140   142   145    ?Potassium 3.5 - 5.1 mmol/L 4.7   4.0   3.7    ?Chloride 98 - 111 mmol/L 108      ?CO2 22 - 32 mmol/L 24      ?Calcium 8.9 - 10.3 mg/dL 9.5      ?Total Protein 6.5 - 8.1 g/dL 6.6      ?Total Bilirubin 0.3 - 1.2 mg/dL 1.2      ?Alkaline Phos 38 - 126 U/L 47      ?AST 15 - 41 U/L 27      ?ALT 0 - 44 U/L 25      ? ? ?  Latest Ref Rng & Units 11/08/2021  ?  1:58 PM 08/27/2021  ?  2:12 PM 08/27/2021  ?  2:03 PM  ?CBC  ?WBC 4.0 - 10.5 K/uL 7.7      ?Hemoglobin 13.0 - 17.0 g/dL 15.2   12.6   12.2    ?Hematocrit 39.0 - 52.0 % 45.7   37.0   36.0    ?Platelets 150 - 400 K/uL 254      ? ?  ?HEMOGLOBIN A1C ?Lab Results  ?Component Value Date  ? HGBA1C 7.4 (H) 06/25/2021  ? MPG 165.68 06/25/2021  ?  ? ?Laboratory examination:  ? ?External labs:  ? ?Cholesterol, total 131.000 m 02/28/2020 ?HDL 30 MG/DL 02/28/2020 ?LDL 36.000 mg 02/28/2020 ?Triglycerides 326.000 02/28/2020 ? ?A1C 7.400 % 06/25/2021 ? ?TSH 1.270 02/22/2019 ? ? ?Radiology  ? ?CT angiogram chest 12/16/2017: ?No evidence of pulmonary embolus. Dependent atelectasis. ?Cardiovascular: No filling defects in the pulmonary arteries to ?suggest pulmonary emboli. Insert Heart diffuse coronary artery ?calcifications. Scattered aortic calcifications. Extensive coronary artery calcifications/disease. ? ?CT Angiogram of the abdomen with bilateral lower extremity angiogram 12/26/2020: ?Aortic atherosclerosis.  Aortic Atherosclerosis (ICD10-I70.0). ?  ?Minimal aortoiliac disease with no high-grade stenosis or occlusion. ?Hypogastric arteries remain patent as well as the pelvic arteries.  AAA. ?  ?Relatively symmetric mild atherosclerosis of the femoropopliteal segment. ?  ?Bilateral tibial and pedal circumferential calcifications, limiting ?evaluation of the tibial artery  secondary to blooming artifact from ?the calcium. ? ?Cardiac Studies:  ? ?Lower extremity venous duplex 12/03/2017: ? Venous duplex: Right: There is no evidence of deep vein thrombosis in the lower extre

## 2021-11-26 ENCOUNTER — Telehealth: Payer: Self-pay

## 2021-11-26 NOTE — Telephone Encounter (Signed)
If no further issues with higher dose, continue the meds

## 2021-11-27 NOTE — Telephone Encounter (Signed)
Tried calling patient no answer left a vm

## 2021-12-06 ENCOUNTER — Other Ambulatory Visit: Payer: Self-pay

## 2021-12-06 MED ORDER — ENTRESTO 97-103 MG PO TABS: 1.0000 | ORAL_TABLET | Freq: Two times a day (BID) | ORAL | 2 refills | Status: DC

## 2021-12-06 MED ORDER — ENTRESTO 97-103 MG PO TABS
1.0000 | ORAL_TABLET | Freq: Two times a day (BID) | ORAL | 2 refills | Status: DC
Start: 2021-12-06 — End: 2022-08-06

## 2021-12-17 DIAGNOSIS — Z20822 Contact with and (suspected) exposure to covid-19: Secondary | ICD-10-CM | POA: Diagnosis not present

## 2021-12-19 DIAGNOSIS — L4050 Arthropathic psoriasis, unspecified: Secondary | ICD-10-CM | POA: Diagnosis not present

## 2021-12-22 DIAGNOSIS — E785 Hyperlipidemia, unspecified: Secondary | ICD-10-CM | POA: Diagnosis not present

## 2021-12-22 DIAGNOSIS — M810 Age-related osteoporosis without current pathological fracture: Secondary | ICD-10-CM | POA: Diagnosis not present

## 2021-12-22 DIAGNOSIS — I10 Essential (primary) hypertension: Secondary | ICD-10-CM | POA: Diagnosis not present

## 2021-12-23 DIAGNOSIS — R260 Ataxic gait: Secondary | ICD-10-CM | POA: Diagnosis not present

## 2021-12-25 DIAGNOSIS — R260 Ataxic gait: Secondary | ICD-10-CM | POA: Diagnosis not present

## 2021-12-25 DIAGNOSIS — Z20822 Contact with and (suspected) exposure to covid-19: Secondary | ICD-10-CM | POA: Diagnosis not present

## 2021-12-31 DIAGNOSIS — R260 Ataxic gait: Secondary | ICD-10-CM | POA: Diagnosis not present

## 2022-01-03 DIAGNOSIS — R260 Ataxic gait: Secondary | ICD-10-CM | POA: Diagnosis not present

## 2022-01-09 DIAGNOSIS — R260 Ataxic gait: Secondary | ICD-10-CM | POA: Diagnosis not present

## 2022-01-14 DIAGNOSIS — R260 Ataxic gait: Secondary | ICD-10-CM | POA: Diagnosis not present

## 2022-01-16 DIAGNOSIS — L4050 Arthropathic psoriasis, unspecified: Secondary | ICD-10-CM | POA: Diagnosis not present

## 2022-01-21 DIAGNOSIS — R260 Ataxic gait: Secondary | ICD-10-CM | POA: Diagnosis not present

## 2022-01-22 DIAGNOSIS — E1140 Type 2 diabetes mellitus with diabetic neuropathy, unspecified: Secondary | ICD-10-CM | POA: Diagnosis not present

## 2022-01-22 DIAGNOSIS — I5022 Chronic systolic (congestive) heart failure: Secondary | ICD-10-CM | POA: Diagnosis not present

## 2022-01-22 DIAGNOSIS — E785 Hyperlipidemia, unspecified: Secondary | ICD-10-CM | POA: Diagnosis not present

## 2022-01-22 DIAGNOSIS — I10 Essential (primary) hypertension: Secondary | ICD-10-CM | POA: Diagnosis not present

## 2022-01-23 DIAGNOSIS — R260 Ataxic gait: Secondary | ICD-10-CM | POA: Diagnosis not present

## 2022-01-29 DIAGNOSIS — R260 Ataxic gait: Secondary | ICD-10-CM | POA: Diagnosis not present

## 2022-02-04 DIAGNOSIS — Z7901 Long term (current) use of anticoagulants: Secondary | ICD-10-CM | POA: Diagnosis not present

## 2022-02-04 DIAGNOSIS — I5022 Chronic systolic (congestive) heart failure: Secondary | ICD-10-CM | POA: Diagnosis not present

## 2022-02-04 DIAGNOSIS — E1140 Type 2 diabetes mellitus with diabetic neuropathy, unspecified: Secondary | ICD-10-CM | POA: Diagnosis not present

## 2022-02-04 DIAGNOSIS — I700 Atherosclerosis of aorta: Secondary | ICD-10-CM | POA: Diagnosis not present

## 2022-02-04 DIAGNOSIS — R972 Elevated prostate specific antigen [PSA]: Secondary | ICD-10-CM | POA: Diagnosis not present

## 2022-02-04 DIAGNOSIS — I10 Essential (primary) hypertension: Secondary | ICD-10-CM | POA: Diagnosis not present

## 2022-02-04 DIAGNOSIS — E785 Hyperlipidemia, unspecified: Secondary | ICD-10-CM | POA: Diagnosis not present

## 2022-02-04 DIAGNOSIS — Z794 Long term (current) use of insulin: Secondary | ICD-10-CM | POA: Diagnosis not present

## 2022-02-04 DIAGNOSIS — G4733 Obstructive sleep apnea (adult) (pediatric): Secondary | ICD-10-CM | POA: Diagnosis not present

## 2022-02-04 DIAGNOSIS — E118 Type 2 diabetes mellitus with unspecified complications: Secondary | ICD-10-CM | POA: Diagnosis not present

## 2022-02-05 DIAGNOSIS — L409 Psoriasis, unspecified: Secondary | ICD-10-CM | POA: Diagnosis not present

## 2022-02-05 DIAGNOSIS — E669 Obesity, unspecified: Secondary | ICD-10-CM | POA: Diagnosis not present

## 2022-02-05 DIAGNOSIS — Z6833 Body mass index (BMI) 33.0-33.9, adult: Secondary | ICD-10-CM | POA: Diagnosis not present

## 2022-02-05 DIAGNOSIS — Z79899 Other long term (current) drug therapy: Secondary | ICD-10-CM | POA: Diagnosis not present

## 2022-02-05 DIAGNOSIS — M1991 Primary osteoarthritis, unspecified site: Secondary | ICD-10-CM | POA: Diagnosis not present

## 2022-02-05 DIAGNOSIS — R7989 Other specified abnormal findings of blood chemistry: Secondary | ICD-10-CM | POA: Diagnosis not present

## 2022-02-05 DIAGNOSIS — I5020 Unspecified systolic (congestive) heart failure: Secondary | ICD-10-CM | POA: Diagnosis not present

## 2022-02-05 DIAGNOSIS — L4050 Arthropathic psoriasis, unspecified: Secondary | ICD-10-CM | POA: Diagnosis not present

## 2022-02-05 DIAGNOSIS — S22080A Wedge compression fracture of T11-T12 vertebra, initial encounter for closed fracture: Secondary | ICD-10-CM | POA: Diagnosis not present

## 2022-02-10 ENCOUNTER — Telehealth: Payer: Self-pay

## 2022-02-10 NOTE — Telephone Encounter (Signed)
Yes please keep July or bring a little early and make sure he has echo done prior to the visit

## 2022-02-10 NOTE — Telephone Encounter (Signed)
Patient called in stating that he is having issues with Entresto medication, he is having symptoms such as low blood pressure, overexposed vision, shortness of breath and the patient is afraid to drive at times. Patient had an appointment in July, but received an e-mail that it got pushed back to September, patient wants to keep his July appointment due to having issues with the [facility] medication. Please, advise.

## 2022-02-11 DIAGNOSIS — R260 Ataxic gait: Secondary | ICD-10-CM | POA: Diagnosis not present

## 2022-02-12 DIAGNOSIS — E669 Obesity, unspecified: Secondary | ICD-10-CM | POA: Diagnosis not present

## 2022-02-12 DIAGNOSIS — Z794 Long term (current) use of insulin: Secondary | ICD-10-CM | POA: Diagnosis not present

## 2022-02-12 DIAGNOSIS — E785 Hyperlipidemia, unspecified: Secondary | ICD-10-CM | POA: Diagnosis not present

## 2022-02-12 DIAGNOSIS — E1129 Type 2 diabetes mellitus with other diabetic kidney complication: Secondary | ICD-10-CM | POA: Diagnosis not present

## 2022-02-12 DIAGNOSIS — I10 Essential (primary) hypertension: Secondary | ICD-10-CM | POA: Diagnosis not present

## 2022-02-12 DIAGNOSIS — I5022 Chronic systolic (congestive) heart failure: Secondary | ICD-10-CM | POA: Diagnosis not present

## 2022-02-13 DIAGNOSIS — R260 Ataxic gait: Secondary | ICD-10-CM | POA: Diagnosis not present

## 2022-02-17 DIAGNOSIS — L4050 Arthropathic psoriasis, unspecified: Secondary | ICD-10-CM | POA: Diagnosis not present

## 2022-02-18 DIAGNOSIS — R260 Ataxic gait: Secondary | ICD-10-CM | POA: Diagnosis not present

## 2022-02-20 DIAGNOSIS — R260 Ataxic gait: Secondary | ICD-10-CM | POA: Diagnosis not present

## 2022-02-27 DIAGNOSIS — R260 Ataxic gait: Secondary | ICD-10-CM | POA: Diagnosis not present

## 2022-03-06 DIAGNOSIS — R260 Ataxic gait: Secondary | ICD-10-CM | POA: Diagnosis not present

## 2022-03-07 ENCOUNTER — Other Ambulatory Visit: Payer: Medicare Other

## 2022-03-07 ENCOUNTER — Ambulatory Visit: Payer: Medicare Other

## 2022-03-07 DIAGNOSIS — I5022 Chronic systolic (congestive) heart failure: Secondary | ICD-10-CM | POA: Diagnosis not present

## 2022-03-07 DIAGNOSIS — L821 Other seborrheic keratosis: Secondary | ICD-10-CM | POA: Diagnosis not present

## 2022-03-07 DIAGNOSIS — D225 Melanocytic nevi of trunk: Secondary | ICD-10-CM | POA: Diagnosis not present

## 2022-03-07 DIAGNOSIS — L853 Xerosis cutis: Secondary | ICD-10-CM | POA: Diagnosis not present

## 2022-03-07 DIAGNOSIS — L400 Psoriasis vulgaris: Secondary | ICD-10-CM | POA: Diagnosis not present

## 2022-03-07 DIAGNOSIS — D1801 Hemangioma of skin and subcutaneous tissue: Secondary | ICD-10-CM | POA: Diagnosis not present

## 2022-03-07 DIAGNOSIS — L918 Other hypertrophic disorders of the skin: Secondary | ICD-10-CM | POA: Diagnosis not present

## 2022-03-11 DIAGNOSIS — R260 Ataxic gait: Secondary | ICD-10-CM | POA: Diagnosis not present

## 2022-03-13 DIAGNOSIS — R260 Ataxic gait: Secondary | ICD-10-CM | POA: Diagnosis not present

## 2022-03-14 ENCOUNTER — Encounter: Payer: Self-pay | Admitting: Cardiology

## 2022-03-14 ENCOUNTER — Ambulatory Visit: Payer: Medicare Other | Admitting: Cardiology

## 2022-03-14 VITALS — BP 112/72 | HR 56 | Temp 97.8°F | Resp 17 | Ht 71.0 in | Wt 256.6 lb

## 2022-03-14 DIAGNOSIS — I10 Essential (primary) hypertension: Secondary | ICD-10-CM | POA: Diagnosis not present

## 2022-03-14 DIAGNOSIS — G4733 Obstructive sleep apnea (adult) (pediatric): Secondary | ICD-10-CM | POA: Diagnosis not present

## 2022-03-14 DIAGNOSIS — E66812 Obesity, class 2: Secondary | ICD-10-CM

## 2022-03-14 DIAGNOSIS — I5022 Chronic systolic (congestive) heart failure: Secondary | ICD-10-CM

## 2022-03-14 DIAGNOSIS — I447 Left bundle-branch block, unspecified: Secondary | ICD-10-CM | POA: Diagnosis not present

## 2022-03-14 DIAGNOSIS — Z6835 Body mass index (BMI) 35.0-35.9, adult: Secondary | ICD-10-CM | POA: Diagnosis not present

## 2022-03-14 DIAGNOSIS — I428 Other cardiomyopathies: Secondary | ICD-10-CM | POA: Diagnosis not present

## 2022-03-14 NOTE — Progress Notes (Signed)
Primary Physician/Referring:  Donnajean Lopes, MD  Patient ID: Anthony Case, male    DOB: Jun 16, 1951, 71 y.o.   MRN: [id number]  Chief Complaint  Patient presents with   NONISCHEMIC CARDIOMYOPATHY   PRIMARY HYPERTENSION   HPI:    Anthony Case  is a 71 y.o.  Caucasian male with history of PE in 2013 after a long travel and a spontaneous DVT in 2009, now on chronic Xarelto, diabetes mellitus, OSA on CPAP, rheumatoid and psoriatic arthritis, ankylosing spondylitis, hypertension, mild hyperlipidemia, bicuspid aortic valve, PAD with small vessel disease due to uncontrolled diabetes mellitus, who had new onset left bundle branch block and also cardiomyopathy with severe LV systolic dysfunction in October 2022, new onset LBBB, right and left heart catheterization on 09/16/2021 essentially revealing nonischemic dilated cardiomyopathy.  He has had COVID in June 2022, also had upper respiratory infections as well.  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  Patient is here on a 38-monthoffice visit, we have gradually uptitrated his Entresto to the maximum tolerated dose. He remains asymptomatic except occasional dizziness.  He also complains of marked fatigue.  He has not been using his CPAP.  He has not had it evaluated in >5 years.   Past Medical History:  Diagnosis Date   Arthritis    Diabetes mellitus    Hx pulmonary embolism 08/26/2007   required emergent tPA treatment   Hypertension    Shortness of breath    Sleep apnea    Social History   Tobacco Use   Smoking status: Never   Smokeless tobacco: Never  Substance Use Topics   Alcohol use: Not Currently    Comment: Previously drinking 3-5 drinks nightly x 20 year   Marital Status: Married  ROS  Review of Systems  Cardiovascular:  Positive for claudication. Negative for chest pain, dyspnea on exertion and leg swelling.  Neurological:  Positive for paresthesias (bilateral feet).     Objective  Blood pressure 112/72, pulse (!) 56, temperature 97.8 F (36.6 C), temperature source Temporal, resp. rate 17, height '5\' 11"'$  (1.803 m), weight 256 lb 9.6 oz (116.4 kg), SpO2 94 %. Body mass index is 35.79 kg/m.     03/14/2022   12:04 PM 03/14/2022   11:51 AM 11/14/2021    3:09 PM  Vitals with BMI  Height  '5\' 11"'$  '5\' 11"'$   Weight  256 lbs 10 oz 256 lbs  BMI  319.4317.40 Systolic [id number] 1481 Diastolic 72 55 65  Pulse 56 78 68    Physical Exam Neck:     Vascular: No carotid bruit or JVD.  Cardiovascular:     Rate and Rhythm: Normal rate and regular rhythm.     Pulses:          Carotid pulses are 2+ on the right side and 2+ on the left side.      Femoral pulses are 2+ on the right side and 2+ on the left side.      Popliteal pulses are 2+ on the right side and 2+ on the left side.       Dorsalis pedis pulses are 0 on the right side and 2+ on the left side.       Posterior tibial pulses are 0 on the right side and 0 on the left side.     Heart sounds: Heart sounds are distant. No murmur heard.    No gallop.  Pulmonary:  Effort: Pulmonary effort is normal.     Breath sounds: Normal breath sounds.  Musculoskeletal:        General: No swelling.      Medications and allergies   Allergies  Allergen Reactions   Lisinopril Cough    Medication list after today's encounter    Current Outpatient Medications:    acetaminophen (TYLENOL) 500 MG tablet, Take 1,000 mg by mouth every 6 (six) hours as needed for moderate pain., Disp: , Rfl:    Ascorbic Acid (VITAMIN C) 1000 MG tablet, Take 1,000 mg by mouth daily., Disp: , Rfl:    atorvastatin (LIPITOR) 20 MG tablet, Take 20 mg by mouth daily., Disp: , Rfl:    augmented betamethasone dipropionate (DIPROLENE-AF) 0.05 % cream, Apply 1 Application topically in the morning and at bedtime., Disp: , Rfl:    dapagliflozin propanediol (FARXIGA) 10 MG TABS tablet, Take 10 mg by mouth daily., Disp: , Rfl:    furosemide (LASIX) 20  MG tablet, Take 20 mg by mouth daily as needed for edema., Disp: , Rfl:    glipiZIDE (GLUCOTROL XL) 2.5 MG 24 hr tablet, Take 5 mg by mouth daily with breakfast., Disp: , Rfl:    HYDROcodone-acetaminophen (NORCO/VICODIN) 5-325 MG tablet, Take 1 tablet by mouth every 6 (six) hours as needed for moderate pain., Disp: , Rfl:    ketoconazole (NIZORAL) 2 % shampoo, Apply 1 Application topically as directed., Disp: , Rfl:    LEVEMIR FLEXTOUCH 100 UNIT/ML FlexPen, Inject 60 Units into the skin every evening., Disp: , Rfl:    metFORMIN (GLUCOPHAGE) 500 MG tablet, Take 2,000 mg by mouth daily with breakfast., Disp: , Rfl:    metoprolol succinate (TOPROL-XL) 50 MG 24 hr tablet, Take 1 tablet (50 mg total) by mouth daily., Disp: 90 tablet, Rfl: 1   Multiple Vitamin (MULTIVITAMIN WITH MINERALS) TABS tablet, Take 1 tablet by mouth daily., Disp: , Rfl:    NON FORMULARY, Trimix, Disp: , Rfl:    Omega-3 Fatty Acids (FISH OIL) 1000 MG CAPS, Take 1,000 mg by mouth daily., Disp: , Rfl:    rivaroxaban (XARELTO) 20 MG TABS tablet, Take 1 tablet (20 mg total) by mouth daily. 08/04/13 last dose of xarelto in preparation for colonoscopy, Disp: 30 tablet, Rfl:    sacubitril-valsartan (ENTRESTO) 97-103 MG, Take 1 tablet by mouth 2 (two) times daily., Disp: 180 tablet, Rfl: 2   Vitamin D, Ergocalciferol, (DRISDOL) 1.25 MG (50000 UNIT) CAPS capsule, Take 50,000 Units by mouth every 7 (seven) days., Disp: , Rfl:   Laboratory examination:   Recent Labs    06/25/21 0910 08/27/21 0952 08/27/21 1359 08/27/21 1403 08/27/21 1412 11/08/21 1358  NA 143 139   < > 145 142 140  K 4.1 4.4   < > 3.7 4.0 4.7  CL 111 108  --   --   --  108  CO2 26 22  --   --   --  24  GLUCOSE 104* 193*  --   --   --  179*  BUN 20 18  --   --   --  23  CREATININE 1.09 1.09  --   --   --  1.12  CALCIUM 9.4 9.5  --   --   --  9.5  GFRNONAA >60 >60  --   --   --  >60   < > = values in this interval not displayed.   CrCl cannot be calculated  (Patient's most recent lab result is  older than the maximum 21 days allowed.).     Latest Ref Rng & Units 11/08/2021    1:58 PM 08/27/2021    2:12 PM 08/27/2021    2:03 PM  CMP  Glucose 70 - 99 mg/dL 179     BUN 8 - 23 mg/dL 23     Creatinine 0.61 - 1.24 mg/dL 1.12     Sodium 135 - 145 mmol/L 140  142  145   Potassium 3.5 - 5.1 mmol/L 4.7  4.0  3.7   Chloride 98 - 111 mmol/L 108     CO2 22 - 32 mmol/L 24     Calcium 8.9 - 10.3 mg/dL 9.5     Total Protein 6.5 - 8.1 g/dL 6.6     Total Bilirubin 0.3 - 1.2 mg/dL 1.2     Alkaline Phos 38 - 126 U/L 47     AST 15 - 41 U/L 27     ALT 0 - 44 U/L 25         Latest Ref Rng & Units 11/08/2021    1:58 PM 08/27/2021    2:12 PM 08/27/2021    2:03 PM  CBC  WBC 4.0 - 10.5 K/uL 7.7     Hemoglobin 13.0 - 17.0 g/dL 15.2  12.6  12.2   Hematocrit 39.0 - 52.0 % 45.7  37.0  36.0   Platelets 150 - 400 K/uL 254        HEMOGLOBIN A1C Lab Results  Component Value Date   HGBA1C 7.4 (H) 06/25/2021   MPG 165.68 06/25/2021     Laboratory examination:   External labs:   Cholesterol, total 131.000 m 02/28/2020 HDL 30 MG/DL 02/28/2020 LDL 36.000 mg 02/28/2020 Triglycerides 326.000 02/28/2020  A1C 7.400 % 06/25/2021  TSH 1.270 02/22/2019   Radiology   CT angiogram chest 12/16/2017: No evidence of pulmonary embolus. Dependent atelectasis. Cardiovascular: No filling defects in the pulmonary arteries to suggest pulmonary emboli. Insert Heart diffuse coronary artery calcifications. Scattered aortic calcifications. Extensive coronary artery calcifications/disease.  CT Angiogram of the abdomen with bilateral lower extremity angiogram 12/26/2020: Aortic atherosclerosis.  Aortic Atherosclerosis (ICD10-I70.0).   Minimal aortoiliac disease with no high-grade stenosis or occlusion. Hypogastric arteries remain patent as well as the pelvic arteries.  AAA.   Relatively symmetric mild atherosclerosis of the femoropopliteal segment.   Bilateral tibial and pedal  circumferential calcifications, limiting evaluation of the tibial artery secondary to blooming artifact from the calcium.  Cardiac Studies:   Lower extremity venous duplex 12/03/2017:  Venous duplex: Right: There is no evidence of deep vein thrombosis in the lower extremity.There is no evidence of superficial venous thrombosis. No cystic structure found in the popliteal fossa. Left: There is a chronic deep vein thrombosis noted in one of the paired posterior tibial veins in the mid calf. All other deep vein thrombus noted in the 2013 exam appear resolved. There is no evidence of superficial venous thrombosis. No cystic structure found in the popliteal fossa.  Lexiscan myoview stress test 12/25/2017: 1. Lexiscan stress test performed. Exercise capacity was not assessed. Stress symptoms included dyspnea, dizziness. Peak BP 154/96 mmHg. The resting electrocardiogram demonstrated normal sinus rhythm, normal resting conduction, VPC and normal rest repolarization. Stress EKG is non diagnostic for ischemia as it is a pharmacologic stress. 2. The overall quality of the study is excellent. There is no evidence of abnormal lung activity. Stress and rest SPECT images demonstrate homogeneous tracer distribution throughout the myocardium. Gated SPECT imaging reveals normal myocardial thickening  and wall motion. The left ventricular ejection fraction was normal (55%). Small apical defect likely related to subdiaphragmatic attenuation. 3. Low risk study.  ABI 12/12/2020: Right: Resting right ankle-brachial index indicates noncompressible right lower extremity arteries. The right toe-brachial index is abnormal.  Pedal artery waveforms indicate adequate flow at rest.   Left: Resting left ankle-brachial index indicates noncompressible left lower extremity arteries. The left toe-brachial index is normal.  Pedal artery waveforms indicate adequate flow at rest.   Right and left heart catheterization 08/27/2021: RA  4/3, mean 2 mmHg RV 27/0, EDP 4 mmHg PA 30/10, mean 17 mmHg PW 11/11, mean 9 mmHg. LV 151/8, EDP 18 mmHg.  Ao 145/74, mean 99 mmHg.  No significant pressure gradient across the aortic valve. LVEF  30 to 35% with global hypokinesis.  No significant mitral regurgitation. LM: Mildly calcified but normal. LAD: Large vessel, mild to moderately calcified, there are tandem 40% stenosis in the midsegment due to calcification but without evidence of high-grade stenosis.  Large D1 and D2. CX: Large vessel.  Mild diffuse disease.  Large OM1. RI: Large vessel.  Smooth and normal. RCA: Very large caliber vessel.  Mild diffuse calcification noted.  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  60 mL contrast utilized.  PCV ECHOCARDIOGRAM COMPLETE 03/07/2022  Narrative Echocardiogram 03/07/2022: Study Quality: Technically difficult study. Moderately depressed LV systolic function with visual EF 35-40%. Left ventricle cavity is normal in size. Mild left ventricular hypertrophy. Abnormal septal wall motion due to left bundle branch block. Doppler evidence of grade I (impaired) diastolic dysfunction, normal LAP. Due to poor visualization of endocardial borders cannot comment on regional wall motion abnormalities. Trileaflet aortic valve.  Aortic valve sclerosis with mild aortic stenosis.  Trace aortic regurgitation. Mild aortic valve leaflet thickening. Peak velocity  2.84ms, Peak Pressure Gradient  24.8 mmHg, Mean Gradient 15.2 mmHg, AVA 1.1 cm, Dimensionless Index 0.3. Compared to 05/28/2021 LVEF improved from 25-30% to 35-40%, aortic valve appears trileaflet (prior study noted the valve as functional bicuspid), otherwise no significant change.       EKG:     EKG 03/14/2022: Sinus rhythm with first-degree AV block at rate of 61 bpm, left bundle branch block.  Frequent PVCs (3).  Compared to 07/07/2021, frequent PVCs is new.   Compared to 06/20/2020, LBBB  Assessment     ICD-10-CM   1. Chronic systolic heart failure (HCC)  I50.22 Ambulatory referral to Sleep Studies    2. Non-ischemic cardiomyopathy ([facility]  I42.8     3. Essential hypertension  I10 EKG 12-Lead    4. LBBB (left bundle branch block)  I44.7     5. Class 2 severe obesity due to excess calories with serious comorbidity and body mass index (BMI) of 35.0 to 35.9 in adult (HCC)  E66.01    Z68.35     6. OSA (obstructive sleep apnea)  G47.33 Ambulatory referral to Sleep Studies      There are no discontinued medications.  No orders of the defined types were placed in this encounter.  Orders Placed This Encounter  Procedures   Ambulatory referral to Sleep Studies    Referral Priority:   Routine    Referral Type:   Consultation    Referral Reason:   Specialty Services Required    Referred to Provider:   DLarey Seat MD    Number of Visits Requested:   1   EKG 12-Lead   Recommendations:   RCaro Case  is a 71 y.o.  Caucasian male with history of PE in 2013 after a long travel and a spontaneous DVT in 2009, now on chronic Xarelto, diabetes mellitus, OSA on CPAP, rheumatoid and psoriatic arthritis, ankylosing spondylitis, hypertension, mild hyperlipidemia, bicuspid aortic valve, PAD with small vessel disease due to uncontrolled diabetes mellitus, who had new onset left bundle branch block and also cardiomyopathy with severe LV systolic dysfunction in October 2022, new onset LBBB, right and left heart catheterization on 09/16/2021 essentially revealing nonischemic dilated cardiomyopathy.  He has had COVID in June 2022, also had upper respiratory infections as well.  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  Patient is here on a 40-monthoffice visit, we have gradually uptitrated his Entresto to the maximum tolerated dose. He remains asymptomatic except occasional dizziness.  Patient has not had sleep  evaluation in many years, his fatigue is coming from sleep apnea, he has had obstructive sleep apnea and I know that he has heart failure, I would not be surprised if he also has a component of central sleep apnea.  I will refer him to be evaluated by Dr., Dohmeier as he has not seen any other physicians in >5 years.  We will continue with guideline directed medical therapy, he is not on spironolactone however due to risk of hyperkalemia and low blood pressure.  Otherwise he is on guideline directed medical therapy.  LVEF is also improved.  We will repeat his echocardiogram in 6 months.  I will see him back in 3 months.  Although he has low EF, left bundle branch block, I will wait on CRT-D evaluation as he has not had any acute decompensation with regard to heart failure, also his LVEF is improving and he appears to be motivated in making lifestyle changes.  These issues were discussed with the patient and his wife as well.  He has right hip pain which is suggestive of claudication is clearly a pseudoclaudication.  He has had extensive evaluation including abdominal ultrasound in our office and also previously in 2022 he has had CT angiogram of the abdomen and lower extremity which did not reveal any major vessel disease but small vessel disease only.  From cardiac standpoint advised him to increase his physical activity, join water aerobics and weight loss was extensively discussed with him and his wife at the bedside.  This was a 40-minute office visit encounter.   JAdrian Prows MD, FStanton County Hospital7/21/2023, 12:44 PM Office: [phone]: [phone]: [phone]

## 2022-03-17 DIAGNOSIS — L4050 Arthropathic psoriasis, unspecified: Secondary | ICD-10-CM | POA: Diagnosis not present

## 2022-03-17 DIAGNOSIS — R260 Ataxic gait: Secondary | ICD-10-CM | POA: Diagnosis not present

## 2022-03-24 DIAGNOSIS — I10 Essential (primary) hypertension: Secondary | ICD-10-CM | POA: Diagnosis not present

## 2022-03-24 DIAGNOSIS — E785 Hyperlipidemia, unspecified: Secondary | ICD-10-CM | POA: Diagnosis not present

## 2022-03-24 DIAGNOSIS — I5022 Chronic systolic (congestive) heart failure: Secondary | ICD-10-CM | POA: Diagnosis not present

## 2022-03-24 DIAGNOSIS — E1140 Type 2 diabetes mellitus with diabetic neuropathy, unspecified: Secondary | ICD-10-CM | POA: Diagnosis not present

## 2022-04-15 DIAGNOSIS — R260 Ataxic gait: Secondary | ICD-10-CM | POA: Diagnosis not present

## 2022-04-16 DIAGNOSIS — L4050 Arthropathic psoriasis, unspecified: Secondary | ICD-10-CM | POA: Diagnosis not present

## 2022-04-17 DIAGNOSIS — R260 Ataxic gait: Secondary | ICD-10-CM | POA: Diagnosis not present

## 2022-04-23 ENCOUNTER — Ambulatory Visit (INDEPENDENT_AMBULATORY_CARE_PROVIDER_SITE_OTHER): Payer: Medicare Other | Admitting: Neurology

## 2022-04-23 ENCOUNTER — Encounter: Payer: Self-pay | Admitting: Neurology

## 2022-04-23 VITALS — BP 141/70 | HR 65 | Ht 71.0 in | Wt 256.2 lb

## 2022-04-23 DIAGNOSIS — I452 Bifascicular block: Secondary | ICD-10-CM

## 2022-04-23 DIAGNOSIS — G4710 Hypersomnia, unspecified: Secondary | ICD-10-CM | POA: Diagnosis not present

## 2022-04-23 DIAGNOSIS — I420 Dilated cardiomyopathy: Secondary | ICD-10-CM | POA: Diagnosis not present

## 2022-04-23 DIAGNOSIS — I5023 Acute on chronic systolic (congestive) heart failure: Secondary | ICD-10-CM

## 2022-04-23 DIAGNOSIS — R0609 Other forms of dyspnea: Secondary | ICD-10-CM

## 2022-04-23 DIAGNOSIS — M454 Ankylosing spondylitis of thoracic region: Secondary | ICD-10-CM | POA: Diagnosis not present

## 2022-04-23 NOTE — Progress Notes (Signed)
SLEEP MEDICINE CLINIC    Provider:  Larey Seat, MD  Primary Care Physician:  Donnajean Lopes, [place] [address]     Referring Provider: Adrian Prows, [place],  [place] [id number]   Primary Neurologist : Narda Amber, DO          Chief Complaint according to patient   Patient presents with:     New Patient (Initial Visit)           HISTORY OF PRESENT ILLNESS:  Anthony Case is a 71 y.o. year old  male patient who is still working full term ,seen here as a referral on 04/23/2022 from  for a Sleep evaluation.  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 It is recovered on ENTRESTO to over 40%. Improved SOB.   Anthony Case  has a past medical history of Psoriatic Arthritis, recovering alcoholic, Diabetes mellitus type 2 , on insulin-dx 1998, pulmonary embolism (08/26/2007), Hypertension, Shortness of breath, and Obstructive Sleep apnea on CPAP for decades. He has diabetic neuropathy and skin atrophy, hairless legs, easily bruising.    The patient had the first sleep study in the year 1994 at [facility] and the last one in 08-16-2013 at [facility]:   with a result of an AHI ( Apnea Hypopnea index)  of 43.9/h., resulted by Dr. Danton Sewer.   The patient has been 100% compliant user of his CPAP which was actually set up September 2019 so it is 72 years old at this time and he would have 1 more year to go before we can replace it.  It was replaced without a new sleep test.  His medical conditions however have changed dramatically his CPAP is set at 10 cm of water with 2 cm expiratory pressure relief and is used on average for 7 hours and 55 minutes.  His residual AHI is very high so it is not serving him well the AHI is 19.2.  There are  11.3 obstructive apneas per hour that are not treated with the current setting and his central apnea count is 1.6.  He does have high air leakage which can be improved I think with the visit chinstrap further.  He does not need new supplies. [employer]    Sleep relevant medical history: SOB , Nocturia 0-1, Tonsillectomy, sometimes nocturnal hypoglycemia.  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   and lives in a household with spouse and dog -  Family status is married " Manuela Schwartz", with 3 children ( 30-39) , 1 grandchildren.  The patient currently works irregular hours. Tobacco ION:GEXBM.  ETOH use quit 2018,  Caffeine intake in form of Coffee( /) Soda( dr pepper - 1.5 liters a day) Tea ( /) or energy drinks. Regular exercise in form of walking.  In PT.   Sleep habits are as follows: The patient's dinner time is between 6-7 PM. The patient goes to bed at 11-12 PM and continues to sleep for 7-8 hours, wakes for 1 bathroom break- .   The preferred sleep position is supine, with the support  of 3 pillows.  Dreams are reportedly frequent.  7.30  AM is the usual rise time. The patient wakes up spontaneously at 7 AM . He reports not feeling fully refreshed or restored in AM for about 12-18 months, with symptoms such as dry mouth, morning headaches, and residual fatigue.  Naps are taken rarely, but has fallen asleep on Zoom calls-   with loud snoring. Review of Systems: Out of a complete 14 system review, the patient complains of only the following symptoms, and all other reviewed systems are negative.:  Fatigue, sleepiness , snoring, fragmented sleep.   How likely are you to doze in the following situations: 0 = not likely, 1 = slight chance, 2 = moderate chance, 3 = high chance   Sitting and Reading? Watching Television? Sitting inactive in a public place (theater or  meeting)? As a passenger in a car for an hour without a break? Lying down in the afternoon when circumstances permit? Sitting and talking to someone? Sitting quietly after lunch without alcohol? In a car, while stopped for a few minutes in traffic?   Total = 5-10 / 24 points , varies day by day.  FSS endorsed at 46/ 63 points.  HIGHER ON ENTRESTO  GDS 4/ 15   Social History   Socioeconomic History   Marital status: Married    Spouse name: Not on file   Number of children: 3   Years of education: 18   Highest education level: Manuela Schwartz, a Social worker   Occupational History   Occupation: Optometrist- working for himself.     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   Social Determinants of Health   Financial Resource Strain: Not on file  Food Insecurity: Not on file  Transportation Needs: Not on file  Physical Activity: Not on file  Stress: Not on file  Social Connections: Not on file    Family History  Problem Relation Age of Onset   Heart disease Mother    Schizophrenia Mother    Heart disease Father    Diabetes Father    Heart disease Brother     Past Medical History:  Diagnosis Date   Arthritis    Diabetes mellitus    Hx pulmonary embolism 08/26/2007   required emergent tPA treatment   Hypertension    Shortness of breath    Sleep apnea     Past Surgical History:  Procedure Laterality Date   BACK SURGERY     COLONOSCOPY  2009   adenoma polyp   CYSTOSCOPY/URETEROSCOPY/HOLMIUM LASER/STENT  PLACEMENT Left 07/02/2021   Procedure: CYSTOSCOPY/RETROGRADE/URETEROSCOPY/HOLMIUM LASER/STENT PLACEMENT;  Surgeon: Festus Aloe, MD;  Location: [facility];  Service: Urology;  Laterality: Left;   ELBOW ARTHROSCOPY  2010   IR RADIOLOGIST EVAL & MGMT  12/25/2020   IR RADIOLOGIST EVAL & MGMT  12/28/2020   LUMBAR PERCUTANEOUS PEDICLE SCREW 3 LEVEL N/A 05/25/2013   Procedure: Posterior Percutaneous Fixation from Thoracic nine to Lumbar one vertebrae with arthrodesis of Thoracic eleven Fracture;  Surgeon: Kristeen Miss, MD;  Location: [facility];  Service: Neurosurgery;  Laterality: N/A;  Posterior Percutaneous Fixation from Thoracic nine to Lumbar one vertebrae with arthrodesis of Thoracic eleven Fracture   RIGHT/LEFT HEART CATH AND CORONARY ANGIOGRAPHY N/A 08/27/2021   Procedure: RIGHT/LEFT HEART CATH AND CORONARY ANGIOGRAPHY;  Surgeon: Adrian Prows, MD;  Location: [facility];  Service: Cardiovascular;  Laterality: N/A;     Current Outpatient Medications on File Prior to Visit  Medication Sig Dispense Refill   acetaminophen (TYLENOL) 500 MG tablet Take 1,000 mg by mouth every 6 (six) hours as needed for moderate pain.     Ascorbic Acid (VITAMIN C) 1000 MG tablet Take 1,000 mg by mouth daily.     atorvastatin (LIPITOR) 20 MG tablet Take 20 mg by mouth daily.     augmented betamethasone dipropionate (DIPROLENE-AF) 0.05 % cream Apply 1 Application topically in the morning and at bedtime.     dapagliflozin propanediol (FARXIGA) 10 MG TABS tablet Take 10 mg by mouth daily.     furosemide (LASIX) 20 MG tablet Take 20 mg by mouth daily as needed for edema.     glipiZIDE (GLUCOTROL XL) 2.5 MG 24 hr tablet Take 5 mg by mouth daily with breakfast.     HYDROcodone-acetaminophen (NORCO/VICODIN) 5-325 MG tablet Take 1 tablet by mouth every 6 (six) hours as needed for moderate pain.     LEVEMIR FLEXTOUCH 100 UNIT/ML FlexPen Inject 60 Units into the skin every evening.     metFORMIN (GLUCOPHAGE) 500 MG tablet  Take 2,000 mg by mouth daily with breakfast.     metoprolol succinate (TOPROL-XL) 50 MG 24 hr tablet Take 1 tablet (50 mg total) by mouth daily. 90 tablet 1   Multiple Vitamin (MULTIVITAMIN WITH MINERALS) TABS tablet Take 1 tablet by mouth daily.     NON FORMULARY Trimix     Omega-3 Fatty Acids (FISH OIL) 1000 MG CAPS Take 1,000 mg by mouth daily.     rivaroxaban (XARELTO) 20 MG TABS tablet Take 1 tablet (20 mg total) by mouth daily. 08/04/13 last dose of xarelto in preparation for colonoscopy 30 tablet    sacubitril-valsartan (ENTRESTO) 97-103 MG Take 1 tablet by mouth 2 (two) times daily. 180 tablet 2   Vitamin D, Ergocalciferol, (DRISDOL) 1.25 MG (50000 UNIT) CAPS capsule Take 50,000 Units by mouth every 7 (seven) days.     ketoconazole (NIZORAL) 2 % shampoo Apply 1 Application topically as directed. (Patient not taking: Reported on 04/23/2022)     No current facility-administered medications on file prior to visit.    Allergies  Allergen Reactions   Lisinopril Cough    Physical exam:  Today's Vitals   04/23/22 0902  BP: (!) 141/70  Pulse: 65  SpO2: 97%  Weight: 256 lb 3.2 oz (116.2 kg)  Height: '5\' 11"'$  (1.803 m)   Body mass index is 35.73 kg/m.   Wt Readings from Last 3 Encounters:  04/23/22 256 lb 3.2 oz (116.2 kg)  03/14/22 256 lb 9.6 oz (116.4 kg)  11/14/21 256 lb (116.1 kg)     Ht Readings from Last 3 Encounters:  04/23/22 '5\' 11"'$  (1.803 m)  03/14/22 '5\' 11"'$  (1.803 m)  11/14/21 '5\' 11"'$  (1.803 m)      General: The patient is awake, alert and appears not in  acute distress. The patient is well groomed. Head: Normocephalic, atraumatic. Neck is supple.  Mallampati: 3,  neck circumference:19.5 inches . Nasal airflow congested. Dental status: biological  Cardiovascular:  Regular rate and cardiac rhythm by pulse,  without distended neck veins. Respiratory: Lungs are clear to auscultation.  Skin:  Without evidence of ankle edema,but petechiae.  Trunk: The patient's posture  is erect.   Neurologic exam : The patient is awake and alert, oriented to place and time.   Memory subjective described as intact.  Attention span & concentration ability appears normal.  Speech is fluent,  without  dysarthria, dysphonia or aphasia.  Mood and affect are appropriate.   Cranial nerves: no loss of smell or taste reported  Pupils are equal and briskly reactive to light. Funduscopic exam deferred. .  Extraocular movements in vertical and horizontal planes were intact and without nystagmus. No Diplopia. Visual fields by finger perimetry are intact. Hearing was intact to soft voice and finger rubbing.    Facial sensation intact to fine touch.  Facial motor strength is symmetric and tongue and uvula move midline.  Neck ROM : rotation, tilt and flexion extension were normal for age and shoulder shrug was symmetrical.    Motor exam:  Symmetric bulk, tone and ROM.  He works on Location manager.  Normal tone without cog-wheeling, symmetric grip strength .   Sensory:  Fine touch and vibration were not felt at feet. Reduced at ankles.  Proprioception tested in the upper extremities was normal.   Coordination: Rapid alternating movements in the fingers/hands were of normal speed.  The Finger-to-nose maneuver was intact without evidence of ataxia, dysmetria or tremor.   Gait and station: Patient could rise unassisted from a seated position, walked without assistive device.  Stance is of normal width/ base Toe and heel walk were deferred.   Neuropathy related gait stability impairment.  Deep tendon reflexes: in the  upper and lower extremities are symmetric and intact.  Babinski response was deferred.       After spending a total time of  45  minutes face to face and additional time for physical and neurologic examination, review of laboratory studies,  personal review of imaging studies, reports and results of other testing and review of referral information / records as far as  provided in visit, I have established the following assessments:  1) Current CPAP is 72 years old and no longer working, HIGH RESIDUAL AHI- and his medical history has changed significantly.   2)  50 pound weight loss, new CHF dx and progressive DM,    My Plan is to proceed with:  1)he needs a new attended sleep study - SPLIT at AHI 20/h and see if he needs BIPAP or ASV ?  I would like to thank Donnajean Lopes, MD and Adrian Prows, [address],  [place] [address] for allowing me to meet with and to take care of this pleasant patient.     I plan to follow up either personally or through our NP within 3  months after sleep study.   CC: I will share my notes with referring MD , PCP.  Electronically signed by: Larey Seat, MD 04/23/2022 9:16 AM  [facility] Sleep Board certified by The [employer] of Sleep Medicine and Diplomate of the [employer] of Sleep Medicine. Board certified In Neurology through the [place], Fellow of the [employer] of Neurology. Medical Director of [employer].

## 2022-04-23 NOTE — Patient Instructions (Signed)
Sleep Apnea Sleep apnea affects breathing during sleep. It causes breathing to stop for 10 seconds or more, or to become shallow. People with sleep apnea usually snore loudly. It can also increase the risk of: Heart attack. Stroke. Being very overweight (obese). Diabetes. Heart failure. Irregular heartbeat. High blood pressure. The goal of treatment is to help you breathe normally again. What are the causes?  The most common cause of this condition is a collapsed or blocked airway. There are three kinds of sleep apnea: Obstructive sleep apnea. This is caused by a blocked or collapsed airway. Central sleep apnea. This happens when the brain does not send the right signals to the muscles that control breathing. Mixed sleep apnea. This is a combination of obstructive and central sleep apnea. What increases the risk? Being overweight. Smoking. Having a small airway. Being older. Being male. Drinking alcohol. Taking medicines to calm yourself (sedatives or tranquilizers). Having family members with the condition. Having a tongue or tonsils that are larger than normal. What are the signs or symptoms? Trouble staying asleep. Loud snoring. Headaches in the morning. Waking up gasping. Dry mouth or sore throat in the morning. Being sleepy or tired during the day. If you are sleepy or tired during the day, you may also: Not be able to focus your mind (concentrate). Forget things. Get angry a lot and have mood swings. Feel sad (depressed). Have changes in your personality. Have less interest in sex, if you are male. Be unable to have an erection, if you are male. How is this treated?  Sleeping on your side. Using a medicine to get rid of mucus in your nose (decongestant). Avoiding the use of alcohol, medicines to help you relax, or certain pain medicines (narcotics). Losing weight, if needed. Changing your diet. Quitting smoking. Using a machine to open your airway while you  sleep, such as: An oral appliance. This is a mouthpiece that shifts your lower jaw forward. A CPAP device. This device blows air through a mask when you breathe out (exhale). An EPAP device. This has valves that you put in each nostril. A BIPAP device. This device blows air through a mask when you breathe in (inhale) and breathe out. Having surgery if other treatments do not work. Follow these instructions at home: Lifestyle Make changes that your doctor recommends. Eat a healthy diet. Lose weight if needed. Avoid alcohol, medicines to help you relax, and some pain medicines. Do not smoke or use any products that contain nicotine or tobacco. If you need help quitting, ask your doctor. General instructions Take over-the-counter and prescription medicines only as told by your doctor. If you were given a machine to use while you sleep, use it only as told by your doctor. If you are having surgery, make sure to tell your doctor you have sleep apnea. You may need to bring your device with you. Keep all follow-up visits. Contact a doctor if: The machine that you were given to use during sleep bothers you or does not seem to be working. You do not get better. You get worse. Get help right away if: Your chest hurts. You have trouble breathing in enough air. You have an uncomfortable feeling in your back, arms, or stomach. You have trouble talking. One side of your body feels weak. A part of your face is hanging down. These symptoms may be an emergency. Get help right away. Call your local emergency services (911 in the [place] Do not wait to see if the   symptoms will go away. Do not drive yourself to the hospital. Summary This condition affects breathing during sleep. The most common cause is a collapsed or blocked airway. The goal of treatment is to help you breathe normally while you sleep. This information is not intended to replace advice given to you by your health care provider. Make  sure you discuss any questions you have with your health care provider. Document Revised: 03/20/2021 Document Reviewed: 07/20/2020 Elsevier Patient Education  [place]. Heart Failure, Diagnosis  Heart failure is a condition in which the heart has trouble pumping blood. This may mean that the heart cannot pump enough blood out to the body or that the heart does not fill up with enough blood. For some people with heart failure, fluid may back up into the lungs. There may also be swelling (edema) in the lower legs. Heart failure is usually a long-term (chronic) condition. It is important for you to take good care of yourself and follow the treatment plan from your health care provider. Different stages of heart failure have different treatment plans. The stages are: Stage A: At risk for heart failure. Having no symptoms of heart failure, but being at risk for developing heart failure. Stage B: Pre-heart failure. Having no symptoms of heart failure, but having structural changes to the heart that indicate heart failure. Stage C: Symptomatic heart failure. Having symptoms of heart failure in addition to structural changes to the heart that indicate heart failure. Stage D: Advanced heart failure. Having symptoms that interfere with daily life and frequent hospitalizations related to heart failure. What are the causes? This condition may be caused by: High blood pressure (hypertension). Hypertension causes the heart muscle to work harder than normal. Coronary artery disease, or CAD. CAD is the buildup of cholesterol and fat (plaque) in the arteries of the heart. Heart attack, also called myocardial infarction. This injures the heart muscle, making it hard for the heart to pump blood. Abnormal heart valves. The valves do not open and close properly, forcing the heart to pump harder to keep the blood flowing. Heart muscle disease, inflammation, or infection (cardiomyopathy or myocarditis). This  is damage to the heart muscle. It can increase the risk of heart failure. Lung disease. The heart works harder when the lungs are not healthy. What increases the risk? The risk of heart failure increases as a person ages. This condition is also more likely to develop in people who: Are obese. Use tobacco or nicotine products. Abuse alcohol or drugs. Have taken medicines that can damage the heart, such as chemotherapy drugs. Have any of these conditions: Diabetes. Abnormal heart rhythms. Thyroid problems. Low blood counts (anemia). Chronic kidney disease. Have a family history of heart failure. What are the signs or symptoms? Symptoms of this condition include: Shortness of breath with activity, such as when climbing stairs. A cough that does not go away. Swelling of the feet, ankles, legs, or abdomen. Losing or gaining weight for no reason. Trouble breathing when lying flat. Waking from sleep because of the need to sit up and get more air. Rapid heartbeat. Other symptoms may include: Tiredness (fatigue) and loss of energy. Feeling light-headed, dizzy, or close to fainting. Nausea or loss of appetite. Waking up more often during the night to urinate (nocturia). Confusion. How is this diagnosed? This condition is diagnosed based on: Your medical history, symptoms, and a physical exam. Blood tests. Diagnostic tests, which may include: Echocardiogram. Electrocardiogram (ECG). Chest X-ray. Exercise stress test. Cardiac  MRI. Cardiac catheterization and angiogram. Radionuclide scans. How is this treated? Treatment for this condition is aimed at managing the symptoms of heart failure. Medicines Treatment may include medicines that: Help lower blood pressure by relaxing (dilating) the blood vessels. These medicines are called ACE inhibitors (angiotensin-converting enzyme), ARBs (angiotensin receptor blockers), or vasodilators. Cause the kidneys to remove salt and water from the  blood through urination (diuretics). Improve heart muscle strength and prevent the heart from beating too fast (beta blockers). Increase the force of the heartbeat (digoxin). Lower heart rates. Certain diabetes medicines (SGLT-2 inhibitors) may also be used in treatment. Healthy behavior changes Treatment may also include making healthy lifestyle changes, such as: Reaching and staying at a healthy weight. Not using tobacco or nicotine products. Eating heart-healthy foods. Limiting or avoiding alcohol. Stopping the use of illegal drugs. Being physically active. Participating in a cardiac rehabilitation program, which is a treatment program to improve your health and well-being through exercise training, education, and counseling. Other treatments Other treatments may include: Procedures to open blocked arteries or repair damaged valves. Placing a pacemaker to improve heart function (cardiac resynchronization therapy). Placing a device to treat serious abnormal heart rhythms (implantable cardioverter defibrillator, or ICD). Placing a device to improve the pumping ability of the heart (left ventricular assist device, or LVAD). Receiving a healthy heart from a donor (heart transplant). This is done when other treatments have not helped. Follow these instructions at home: Manage other health conditions as told by your health care provider. These may include hypertension, diabetes, thyroid disease, or abnormal heart rhythms. Get ongoing education and support as needed. Learn as much as you can about heart failure. Keep all follow-up visits. This is important. Where to find more information American Heart Association: www.heart.org Centers for Disease Control and Prevention: http://www.wolf.info/ NIH National Institute on Aging: http://kim-miller.com/ Summary Heart failure is a condition in which the heart has trouble pumping blood. This condition is commonly caused by high blood pressure and other diseases  of the heart and lungs. Symptoms of this condition include shortness of breath, tiredness (fatigue), nausea, and swelling of the feet, ankles, legs, or abdomen. Treatments for this condition may include medicines, lifestyle changes, and surgery. Manage other health conditions as told by your health care provider. This information is not intended to replace advice given to you by your health care provider. Make sure you discuss any questions you have with your health care provider. Document Revised: 11/19/2021 Document Reviewed: 03/03/2020 Elsevier Patient Education  [place].

## 2022-04-24 DIAGNOSIS — R260 Ataxic gait: Secondary | ICD-10-CM | POA: Diagnosis not present

## 2022-04-29 DIAGNOSIS — R260 Ataxic gait: Secondary | ICD-10-CM | POA: Diagnosis not present

## 2022-05-01 DIAGNOSIS — R260 Ataxic gait: Secondary | ICD-10-CM | POA: Diagnosis not present

## 2022-05-06 ENCOUNTER — Telehealth: Payer: Self-pay | Admitting: Neurology

## 2022-05-06 DIAGNOSIS — R260 Ataxic gait: Secondary | ICD-10-CM | POA: Diagnosis not present

## 2022-05-06 NOTE — Telephone Encounter (Signed)
Split- Medicare/BCBS supp no auth req.  Patient is scheduled at [facility] for 05/20/22 at 8 pm.  Mailed packet to the patient.

## 2022-05-07 ENCOUNTER — Other Ambulatory Visit: Payer: Medicare Other

## 2022-05-08 ENCOUNTER — Encounter: Payer: Self-pay | Admitting: Neurology

## 2022-05-15 ENCOUNTER — Ambulatory Visit: Payer: Medicare Other | Admitting: Cardiology

## 2022-05-20 ENCOUNTER — Ambulatory Visit (INDEPENDENT_AMBULATORY_CARE_PROVIDER_SITE_OTHER): Payer: Medicare Other | Admitting: Neurology

## 2022-05-20 DIAGNOSIS — I452 Bifascicular block: Secondary | ICD-10-CM

## 2022-05-20 DIAGNOSIS — R260 Ataxic gait: Secondary | ICD-10-CM | POA: Diagnosis not present

## 2022-05-20 DIAGNOSIS — G4733 Obstructive sleep apnea (adult) (pediatric): Secondary | ICD-10-CM | POA: Diagnosis not present

## 2022-05-20 DIAGNOSIS — I420 Dilated cardiomyopathy: Secondary | ICD-10-CM

## 2022-05-20 DIAGNOSIS — G4710 Hypersomnia, unspecified: Secondary | ICD-10-CM

## 2022-05-20 DIAGNOSIS — M454 Ankylosing spondylitis of thoracic region: Secondary | ICD-10-CM

## 2022-05-20 DIAGNOSIS — R0609 Other forms of dyspnea: Secondary | ICD-10-CM

## 2022-05-20 DIAGNOSIS — I5023 Acute on chronic systolic (congestive) heart failure: Secondary | ICD-10-CM

## 2022-05-21 DIAGNOSIS — L4050 Arthropathic psoriasis, unspecified: Secondary | ICD-10-CM | POA: Diagnosis not present

## 2022-05-22 ENCOUNTER — Ambulatory Visit: Payer: Medicare Other | Admitting: Cardiology

## 2022-05-22 DIAGNOSIS — R260 Ataxic gait: Secondary | ICD-10-CM | POA: Diagnosis not present

## 2022-05-27 DIAGNOSIS — R260 Ataxic gait: Secondary | ICD-10-CM | POA: Diagnosis not present

## 2022-05-28 DIAGNOSIS — Z23 Encounter for immunization: Secondary | ICD-10-CM | POA: Diagnosis not present

## 2022-05-28 NOTE — Procedures (Signed)
STUDY DATE: 05/20/2022     PATIENT NAME:  Anthony Case. Maiden         DATE OF BIRTH:  12-09-1950  PATIENT ID:  [id number]    TYPE OF STUDY:  SPLIT  READING PHYSICIAN: Anthony Seat, MD SCORING TECHNICIAN: Anthony Case RPSGT  REFERRING PHYSICIAN: Dr Anthony Case PCP: Anthony.Paterson, MD    Medical & Medication History  Anthony Case is a 71 y.o. year old male patient who is still working full time and seen here as a referral on 04/23/2022 from Anthony Case for a Sleep evaluation.  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 It is recovered on ENTRESTO to over 40%. Improved SOB.   Anthony Case has a past medical history of Psoriatic Arthritis, recovering alcoholic, Diabetes mellitus type 2 , on insulin-dx 1998, pulmonary embolism (08/26/2007), Hypertension, Shortness of breath, and Obstructive Sleep apnea on CPAP for decades. He has diabetic neuropathy and skin atrophy, hairless legs, easily bruising.   The patient had the first sleep study in the year 1994 at [facility] and the last one in 08-16-2013 at [facility]: with a result of an AHI (Apnea Hypopnea index) of 43.9/h., resulted by Anthony. Danton Case.    The patient has been 100% compliant user of his CPAP, which was set up September 20177 - it is 72 years old at this time and he would have 1 more year to go before we can replace it. It was last replaced without a new sleep test.  His medical conditions however have changed dramatically.His CPAP is set at 10 cm of water with 2 cm expiratory pressure relief and is used on average for 7 hours and 55 minutes. His residual AHI is very high so it is not serving him well the AHI is 19.2. There are 11.3 obstructive apneas per hour that are not treated with the current setting and his central apnea count is 1.6. He does have  high air leakage which can be improved  by using a chinstrap -He does not need new supplies. [employer]    The Epworth Sleepiness Scale was 4 / 24 (scores above or equal to 10 are suggestive of hypersomnolence). FSS endorsed at 46/63 points, GDS 4/ 15 points.   Height: 71.0 in Weight: 256 lbs (BMI 35) Neck Size: 19.5 in   MEDICATIONS: Tylenol, Vitamin C, Lipitor, Diprolene, Farxiga, Lasix, Glucotrol, Vicodin, Glucophage, Toprol - XL, Multivitamin, Fish oil, Xarelto, Entresto, Drisdol, Nizoral, Levemir  TECHNICAL DESCRIPTION: A RPSGT sleep technologist was in attendance for the duration of the recording.  Data collection, scoring, video monitoring, and reporting were performed in compliance with the AASM Manual for the Scoring of Sleep and Associated Events; (Hypopnea is scored based on the criteria listed in Section VIII D. 1b in the AASM Manual V2.6 using a 4% oxygen desaturation rule or Hypopnea is scored based on the criteria listed in Section VIII D. 1a in the AASM Manual V2.6 using 3% oxygen desaturation and /or arousal rule).    Comments  The patient came into the sleep lab for a SPLIT night study. The patient was split due to having an AHI of 20 or higher after 2 hours of TST.    STUDY DETAILS for both parts: Lights off was at 20:59: and lights on 05:02: (483  minutes in bed). This study was performed with an initial diagnostic portion followed by positive airway pressure titration.  DIAGNOSTIC PART, PART 1 of the SPLIT PROTOCOL   SLEEP CONTINUITY AND SLEEP ARCHITECTURE:  The diagnostic portion of the study began at 20:59 and ended at 01:25, for a recording time of 4h 27.46mminutes.  Wake after sleep onset (WASO) time accounted for 15 minutes. Sleep latency was 2 minutes and REM sleep latency was 2h 11 m.   BODY POSITION: Duration of total sleep and percent of total sleep in their respective position is as follows: supine 123 minutes (100.0%), non-supine 0.0  (0.0%);.Marland KitchenTotal supine REM sleep  time was 15 minutes (100.0% of total REM sleep).   RESPIRATORY MONITORING:  Based on CMS criteria (using a 4% oxygen desaturation rule for scoring hypopneas), there were 15 apneas (14 obstructive; 1 central; 0 mixed), and 76 hypopneas.   Apnea index was 6.3/h. Hypopnea index was 28.8/h. The apnea-hypopnea index was 35.1/h overall (35.1/h supine; 54.2/h in REM, 54.2/h in supine REM). There were 0 respiratory effort-related arousals (RERAs).  The RERA index was 0.0 events/h.   LIMB MOVEMENTS: There were 82 periodic limb movements of sleep (40.0/h), of which 0 (0.0/h) were associated with an arousal.   OXIMETRY: Oxyhemoglobin desaturations (nadir 75%) from a normal baseline (mean 96%). Total time spent at, or below 88% was 16.2 minutes, or 13.1% of total sleep time. Snoring.    EKG: There was bradycardic and irregular for the baseline portion of the study.  The average heart rate during sleep was 53 bpm, between a sleep heart rate of 31 bpm and 68 bpm.   AROUSAL (Baseline): There were 9.0 arousals in total, 3.0 were identified as respiratory-related arousals (1.5 /h), 0 were PLM-related arousals (0.0 /h), and 6 were non-specific arousals (2.9 /h)       THERAPY with PAP, Part 2 of SPLIT PROTOCOL Per MD's order split at an AHI of 20. The patient was fitted with his at home interface F&P Simplus (FFM) size med. The patient tolerated the mask well. The tech had to adjust his home interface as it was too loose. Tech did add a chin strap to help keep the patient's mouth from dropping below the interface. CPAP was started at 10 cmH2O with EPR of 2. This is the home pressure for the patient. CPAP was titrated up to 15cmH2O with EPR of 2. Pt was having events in REM while supine. CPAP not fully titrated in REM. Mild snoring was noted prior to CPAP placement. PLM's witnessed. He slept supine only. EKG irregular  SLEEP CONTINUITY AND SLEEP ARCHITECTURE:  Treatment portion of the study began at 01:25 and ended at  05:02, for a recording time of 3h 36.573minutes.  Total sleep time was 183 minutes with a decreased sleep efficiency at 84.8%.  Sleep latency was normal at 10.0 minutes. REM sleep latency was increased at 131.5 minutes.  Arousal index was 6.5 /hr.  Wake after sleep onset (WASO) time accounted for 23 minutes.   BODY POSITION: Duration of total sleep and percent of total sleep in their respective position is as follows: supine 183 minutes (100.0%), non-supine 0.0 minutes (0.0%);    RESPIRATORY MONITORING:  While on PAP therapy, based on CMS criteria, the AHI (apnea-hypopnea index) was 6.2/h overall with an AHI of 6.2/h all supine; and AHI of 3.9/h in REM sleep.    OXIMETRY: Oxyhemoglobin desaturation (nadir at 73.0%) from a mean of 96.0%.  Total time spent at,  or below 88% was 2.9 minutes, or 1.6% of total sleep time.  Snoring was controlled.  LIMB MOVEMENTS: There were 46 periodic limb movements of sleep (15.0/h), of which 0 (0.0/h) were associated with an arousal.    EKG: irregular heart rhythm with multiform QRS complexes, R to R intervals.  The average heart rate during sleep under CPAP was 46 bpm, between a heart rate of 30 bpm and 83 bpm.   AROUSAL: There were 17.0 arousals in total, for an arousal index of 5.6 arousals/hour.  Of these, 4.0 were identified as respiratory-related arousals (1.3 /h), 0 were PLM-related arousals (0.0 /h), and 16 were non-specific arousals (5.2 /h)    IMPRESSION:  SLEEP APNEA : This study documented moderate-severe sleep apnea, at an AHI of 20/h. At baseline these events were associated with frequent oxyhemoglobin desaturation.  This study may underestimate the severity of apnea, as REM sleep was reduced during the recording prior to the initiation of PAP therapy. All sleep in both parts of the study was supine.  The patient used a familiar mask/ interface and was titrated to 15 cm water CPAP with 2 cm EPR. This setting did not prevent all AH events in supine  REM sleep but worked well in NREM supine sleep. AHI under CPAP at 15 cm water c 2 cm EPR was 4/h. these were hypopneas, cyclic pattern. A medium sized [employer] SIMPLUS FFM with a chin strap was used.    Frequent periodic limb movements (PLMs) of sleep were observed but were reduced under CPAP .   Continuous arrhythmia was noted, with bradycardia.   RECOMMENDATIONS: AHI under 15 cm water CPAP pressure with 2 cm EPR was 4/h A medium sized [employer] SIMPLUS FFM with a chin strap was used- this is the patient current mask but needs adjustments to his home CPAP.    We will revisit with the patient after 30 and before 90 days of treatment on the new settings are over, evaluate if FSS and Epworth are decreasing.   Anthony Seat, MD

## 2022-05-28 NOTE — Progress Notes (Signed)
Sleep study 05/20/2022: (Moderate to severe )OSA, frequent periodic limb movements, bradycardia during the study.  Patient treated with CPAP.

## 2022-05-28 NOTE — Addendum Note (Signed)
Addended by: Larey Seat on: 05/28/2022 03:03 PM   Modules accepted: Orders

## 2022-05-29 DIAGNOSIS — R260 Ataxic gait: Secondary | ICD-10-CM | POA: Diagnosis not present

## 2022-06-02 ENCOUNTER — Telehealth: Payer: Self-pay | Admitting: Neurology

## 2022-06-02 ENCOUNTER — Other Ambulatory Visit: Payer: Self-pay | Admitting: Neurology

## 2022-06-02 DIAGNOSIS — G4710 Hypersomnia, unspecified: Secondary | ICD-10-CM

## 2022-06-02 DIAGNOSIS — G4733 Obstructive sleep apnea (adult) (pediatric): Secondary | ICD-10-CM

## 2022-06-02 NOTE — Telephone Encounter (Signed)
Called the pt to review sleep study results. Pt states he couldn't talk at the moment but would call back in few minutes.  ** If pt returns call, pleases see if we are available to take the call.

## 2022-06-02 NOTE — Telephone Encounter (Signed)
-----   Message from Larey Seat, MD sent at 05/28/2022  3:03 PM EDT ----- IMPRESSION:  SLEEP APNEA: This study documented moderate-severe sleep apnea, at an AHI of 20/h. At baseline these events were associated withfrequentoxyhemoglobin desaturation. This study may underestimate the severity ofapnea, as REM sleep wasreducedduring the recording prior to the initiation of PAP therapy. All sleep in both parts of the study was supine.  The patient used a familiar mask/ interface and was titrated to 15 cm water CPAP with 2 cm EPR. This setting did not prevent all AH events in supine REM sleep but worked well in NREM supine sleep. AHI under CPAP at 15 cm water c 2 cm EPR was 4/h. these were hypopneas, cyclic pattern. A medium sized [employer] SIMPLUS FFM with a chin strap was used.    Frequent periodic limb movements (PLMs) of sleep were observed but were reduced under CPAP .   Continuous arrhythmia was noted, with bradycardia.  RECOMMENDATIONS: AHI was lowest under 15 cm water CPAP pressure with 2 cm EPR = was 4/h A medium sized [employer] SIMPLUS FFM with a chin strap was used- this is the patient current mask but needs adjustments to his home CPAP.    We will revisit with the patient after 30 and before 90 days of treatment on the new settings are over, evaluate if FSS and Epworth are decreasing.   Larey Seat, MD

## 2022-06-12 DIAGNOSIS — I700 Atherosclerosis of aorta: Secondary | ICD-10-CM | POA: Diagnosis not present

## 2022-06-12 DIAGNOSIS — M459 Ankylosing spondylitis of unspecified sites in spine: Secondary | ICD-10-CM | POA: Diagnosis not present

## 2022-06-12 DIAGNOSIS — R2681 Unsteadiness on feet: Secondary | ICD-10-CM | POA: Diagnosis not present

## 2022-06-12 DIAGNOSIS — I5022 Chronic systolic (congestive) heart failure: Secondary | ICD-10-CM | POA: Diagnosis not present

## 2022-06-12 DIAGNOSIS — R260 Ataxic gait: Secondary | ICD-10-CM | POA: Diagnosis not present

## 2022-06-12 DIAGNOSIS — G4733 Obstructive sleep apnea (adult) (pediatric): Secondary | ICD-10-CM | POA: Diagnosis not present

## 2022-06-12 DIAGNOSIS — R972 Elevated prostate specific antigen [PSA]: Secondary | ICD-10-CM | POA: Diagnosis not present

## 2022-06-12 DIAGNOSIS — E785 Hyperlipidemia, unspecified: Secondary | ICD-10-CM | POA: Diagnosis not present

## 2022-06-12 DIAGNOSIS — Z794 Long term (current) use of insulin: Secondary | ICD-10-CM | POA: Diagnosis not present

## 2022-06-12 DIAGNOSIS — E1129 Type 2 diabetes mellitus with other diabetic kidney complication: Secondary | ICD-10-CM | POA: Diagnosis not present

## 2022-06-13 ENCOUNTER — Encounter: Payer: Self-pay | Admitting: Cardiology

## 2022-06-13 ENCOUNTER — Ambulatory Visit: Payer: Medicare Other | Admitting: Cardiology

## 2022-06-13 ENCOUNTER — Other Ambulatory Visit: Payer: Self-pay

## 2022-06-13 VITALS — BP 128/71 | HR 66 | Temp 97.2°F | Resp 16 | Ht 71.0 in | Wt 255.0 lb

## 2022-06-13 DIAGNOSIS — I447 Left bundle-branch block, unspecified: Secondary | ICD-10-CM

## 2022-06-13 DIAGNOSIS — E7800 Pure hypercholesterolemia, unspecified: Secondary | ICD-10-CM

## 2022-06-13 DIAGNOSIS — E1165 Type 2 diabetes mellitus with hyperglycemia: Secondary | ICD-10-CM

## 2022-06-13 DIAGNOSIS — Z794 Long term (current) use of insulin: Secondary | ICD-10-CM | POA: Diagnosis not present

## 2022-06-13 DIAGNOSIS — I5022 Chronic systolic (congestive) heart failure: Secondary | ICD-10-CM

## 2022-06-13 DIAGNOSIS — I428 Other cardiomyopathies: Secondary | ICD-10-CM | POA: Diagnosis not present

## 2022-06-13 DIAGNOSIS — Q231 Congenital insufficiency of aortic valve: Secondary | ICD-10-CM | POA: Diagnosis not present

## 2022-06-13 DIAGNOSIS — Q2381 Bicuspid aortic valve: Secondary | ICD-10-CM

## 2022-06-13 MED ORDER — EZETIMIBE 10 MG PO TABS: 10.0000 mg | ORAL_TABLET | Freq: Every day | ORAL | 3 refills | Status: AC

## 2022-06-13 MED ORDER — EZETIMIBE 10 MG PO TABS: 10.0000 mg | ORAL_TABLET | Freq: Every day | ORAL | 3 refills | Status: DC

## 2022-06-13 NOTE — Progress Notes (Signed)
Primary Physician/Referring:  Donnajean Lopes, MD  Patient ID: Anthony Case, male    DOB: 01-26-1951, 71 y.o.   MRN: [id number]  Chief Complaint  Patient presents with   Cardiomyopathy   Chronic systolic heart failure   Results    Echocardiogram    Follow-up    3 months   HPI:    Anthony Case  is a 71 y.o.  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  Patient is here on a 10-monthoffice visit, states that he has had a sleep study which showed severe sleep apnea and they are trying to fit a mask that would fit him.  He is otherwise doing well, he has not had any further episodes of leg edema or worsening dyspnea, he has not used any furosemide.  No chest pain, dizziness, palpitations or syncope.  Past Medical History:  Diagnosis Date   Arthritis    Diabetes mellitus    Hx pulmonary embolism 08/26/2007   required emergent tPA treatment   Hypertension    Shortness of breath    Sleep apnea    Social History   Tobacco Use   Smoking status: Never   Smokeless tobacco: Never  Substance Use Topics   Alcohol use: Not Currently    Comment: Previously drinking 3-5 drinks nightly x 20 year   Marital Status: Married  ROS  Review of Systems  Cardiovascular:  Positive for claudication. Negative for chest pain, dyspnea on exertion and leg swelling.  Neurological:  Positive for paresthesias  (bilateral feet).    Objective  Blood pressure 128/71, pulse 66, temperature (!) 97.2 F (36.2 C), temperature source Temporal, resp. rate 16, height _0  (1.803 m), weight 255 lb (115.7 kg), SpO2 98 %. Body mass index is 35.57 kg/m.     06/13/2022    9:19 AM 04/23/2022    9:02 AM 03/14/2022   12:04 PM  Vitals with BMI  Height _1  _2    Weight 255 lbs 256 lbs 3 oz   BMI 350.38388.28  Systolic [id number] Diastolic 71 70 72  Pulse 66 65 56    Physical Exam Neck:     Vascular: No carotid bruit or JVD.  Cardiovascular:     Rate and Rhythm: Normal rate and regular rhythm.     Pulses:          Carotid pulses are 2+ on the right side and 2+ on the left side.      Femoral pulses are 2+ on the right side and 2+ on the left side.      Popliteal pulses are 2+ on the right side and 2+ on the left side.       Dorsalis pedis pulses are 0 on the right side and 2+ on the left side.       Posterior tibial pulses are 0 on the right side and 0 on the left side.  Heart sounds: Heart sounds are distant. No murmur heard.    No gallop.  Pulmonary:     Effort: Pulmonary effort is normal.     Breath sounds: Normal breath sounds.  Musculoskeletal:        General: No swelling.      Medications and allergies   Allergies  Allergen Reactions   Lisinopril Cough    Medication list after today's encounter    Current Outpatient Medications:    acetaminophen (TYLENOL) 500 MG tablet, Take 1,000 mg by mouth every 6 (six) hours as needed for moderate pain., Disp: , Rfl:    Ascorbic Acid (VITAMIN C) 1000 MG tablet, Take 1,000 mg by mouth daily., Disp: , Rfl:    atorvastatin (LIPITOR) 20 MG tablet, Take 20 mg by mouth daily., Disp: , Rfl:    augmented betamethasone dipropionate (DIPROLENE-AF) 0.05 % cream, Apply 1 Application topically in the morning and at bedtime., Disp: , Rfl:    cholecalciferol (VITAMIN D3) 25 MCG (1000 UNIT) tablet, Take 1,000 Units by mouth daily., Disp: , Rfl:     dapagliflozin propanediol (FARXIGA) 10 MG TABS tablet, Take 10 mg by mouth daily., Disp: , Rfl:    glipiZIDE (GLUCOTROL XL) 2.5 MG 24 hr tablet, Take 5 mg by mouth daily with breakfast., Disp: , Rfl:    HYDROcodone-acetaminophen (NORCO/VICODIN) 5-325 MG tablet, Take 1 tablet by mouth every 6 (six) hours as needed for moderate pain., Disp: , Rfl:    LEVEMIR FLEXTOUCH 100 UNIT/ML FlexPen, Inject 60 Units into the skin every evening., Disp: , Rfl:    metFORMIN (GLUCOPHAGE) 500 MG tablet, Take 500 mg by mouth 2 (two) times daily with a meal., Disp: , Rfl:    metoprolol succinate (TOPROL-XL) 50 MG 24 hr tablet, Take 1 tablet (50 mg total) by mouth daily., Disp: 90 tablet, Rfl: 1   Multiple Vitamin (MULTIVITAMIN WITH MINERALS) TABS tablet, Take 1 tablet by mouth daily., Disp: , Rfl:    Omega-3 Fatty Acids (FISH OIL) 1000 MG CAPS, Take 1,000 mg by mouth daily., Disp: , Rfl:    rivaroxaban (XARELTO) 20 MG TABS tablet, Take 1 tablet (20 mg total) by mouth daily. 08/04/13 last dose of xarelto in preparation for colonoscopy, Disp: 30 tablet, Rfl:    sacubitril-valsartan (ENTRESTO) 97-103 MG, Take 1 tablet by mouth 2 (two) times daily., Disp: 180 tablet, Rfl: 2  Laboratory examination:   Recent Labs    06/25/21 0910 08/27/21 0952 08/27/21 1359 08/27/21 1403 08/27/21 1412 11/08/21 1358  NA 143 139   < > 145 142 140  K 4.1 4.4   < > 3.7 4.0 4.7  CL 111 108  --   --   --  108  CO2 26 22  --   --   --  24  GLUCOSE 104* 193*  --   --   --  179*  BUN 20 18  --   --   --  23  CREATININE 1.09 1.09  --   --   --  1.12  CALCIUM 9.4 9.5  --   --   --  9.5  GFRNONAA >60 >60  --   --   --  >60   < > = values in this interval not displayed.   Lab Results  Component Value Date   ALT 25 11/08/2021   AST 27 11/08/2021   ALKPHOS 47 11/08/2021   BILITOT 1.2 11/08/2021      Latest Ref Rng & Units 11/08/2021  1:58 PM 08/27/2021    2:12 PM 08/27/2021    2:03 PM  CBC  WBC 4.0 - 10.5 K/uL 7.7     Hemoglobin  13.0 - 17.0 g/dL 15.2  12.6  12.2   Hematocrit 39.0 - 52.0 % 45.7  37.0  36.0   Platelets 150 - 400 K/uL 254        HEMOGLOBIN A1C Lab Results  Component Value Date   HGBA1C 7.4 (H) 06/25/2021   MPG 165.68 06/25/2021     Laboratory examination:   External labs:   Labs 06/13/2022:  A1c 6.1%.  Urinary protein negative.  Vitamin D 32.5.  Labs 10/09/2021:  Total cholesterol 153, triglycerides 172, HDL 30, LDL 89.  Non-HDL cholesterol Maranon 23.  S. Glu 140 mg, BUN 21, creatinine 1.0, EGFR 73 mL, potassium 4.2, LFTs normal.  A1C 7.400 % 06/25/2021  TSH 1.270 02/22/2019  Radiology   CT angiogram chest 12/16/2017: No evidence of pulmonary embolus. Dependent atelectasis. Cardiovascular: No filling defects in the pulmonary arteries to suggest pulmonary emboli. Insert Heart diffuse coronary artery calcifications. Scattered aortic calcifications. Extensive coronary artery calcifications/disease.  CT Angiogram of the abdomen with bilateral lower extremity angiogram 12/26/2020: Aortic atherosclerosis.  Aortic Atherosclerosis (ICD10-I70.0).   Minimal aortoiliac disease with no high-grade stenosis or occlusion. Hypogastric arteries remain patent as well as the pelvic arteries.  AAA.   Relatively symmetric mild atherosclerosis of the femoropopliteal segment.   Bilateral tibial and pedal circumferential calcifications, limiting evaluation of the tibial artery secondary to blooming artifact from the calcium.  Cardiac Studies:   Sleep study 05/20/2022: (Moderate to severe )OSA, frequent periodic limb movements, bradycardia during the study.  Patient treated with CPAP.  Lexiscan myoview stress test 12/25/2017: 1. Lexiscan stress test performed. Exercise capacity was not assessed. Stress symptoms included dyspnea, dizziness. Peak BP 154/96 mmHg. The resting electrocardiogram demonstrated normal sinus rhythm, normal resting conduction, VPC and normal rest repolarization. Stress EKG is non  diagnostic for ischemia as it is a pharmacologic stress. 2. The overall quality of the study is excellent. There is no evidence of abnormal lung activity. Stress and rest SPECT images demonstrate homogeneous tracer distribution throughout the myocardium. Gated SPECT imaging reveals normal myocardial thickening and wall motion. The left ventricular ejection fraction was normal (55%). Small apical defect likely related to subdiaphragmatic attenuation. 3. Low risk study.  ABI 12/12/2020: Right: Resting right ankle-brachial index indicates noncompressible right lower extremity arteries. The right toe-brachial index is abnormal.  Pedal artery waveforms indicate adequate flow at rest.   Left: Resting left ankle-brachial index indicates noncompressible left lower extremity arteries. The left toe-brachial index is normal.  Pedal artery waveforms indicate adequate flow at rest.   Right and left heart catheterization 08/27/2021: RA 4/3, mean 2 mmHg RV 27/0, EDP 4 mmHg PA 30/10, mean 17 mmHg PW 11/11, mean 9 mmHg. LV 151/8, EDP 18 mmHg.  Ao 145/74, mean 99 mmHg.  No significant pressure gradient across the aortic valve. LVEF  30 to 35% with global hypokinesis.  No significant mitral regurgitation. LM: Mildly calcified but normal. LAD: Large vessel, mild to moderately calcified, there are tandem 40% stenosis in the midsegment due to calcification but without evidence of high-grade stenosis.  Large D1 and D2. CX: Large vessel.  Mild diffuse disease.  Large OM1. RI: Large vessel.  Smooth and normal. RCA: Very large caliber vessel.  Mild diffuse calcification noted.  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  60 mL contrast  utilized.  PCV ECHOCARDIOGRAM COMPLETE 03/07/2022  Study Quality: Technically difficult study. Moderately depressed LV systolic function with visual EF 35-40%. Left ventricle cavity is normal in size. Mild  left ventricular hypertrophy. Abnormal septal wall motion due to left bundle branch block. Doppler evidence of grade I (impaired) diastolic dysfunction, normal LAP. Due to poor visualization of endocardial borders cannot comment on regional wall motion abnormalities. Trileaflet aortic valve.  Aortic valve sclerosis with mild aortic stenosis.  Trace aortic regurgitation. Mild aortic valve leaflet thickening. Peak velocity  2.28ms, Peak Pressure Gradient  24.8 mmHg, Mean Gradient 15.2 mmHg, AVA 1.1 cm, Dimensionless Index 0.3. Compared to 05/28/2021 LVEF improved from 25-30% to 35-40%, aortic valve appears trileaflet (prior study noted the valve as functional bicuspid), otherwise no significant change.       EKG:     EKG 03/14/2022: Sinus rhythm with first-degree AV block at rate of 61 bpm, left bundle branch block.  Frequent PVCs (3).  Compared to 07/07/2021, frequent PVCs is new.  Compared to 06/20/2020, LBBB  Assessment     ICD-10-CM   1. Chronic systolic heart failure (HCC)  I50.22     2. Non-ischemic cardiomyopathy ([place]  I42.8     3. LBBB (left bundle branch block)  I44.7     4. Bicuspid aortic valve  Q23.1       Medications Discontinued During This Encounter  Medication Reason   NON FORMULARY    Vitamin D, Ergocalciferol, (DRISDOL) 1.25 MG (50000 UNIT) CAPS capsule Dose change   furosemide (LASIX) 20 MG tablet No longer needed (for PRN medications)   ketoconazole (NIZORAL) 2 % shampoo No longer needed (for PRN medications)    No orders of the defined types were placed in this encounter.  No orders of the defined types were placed in this encounter.  Recommendations:   RDEVEION DENZis a 71y.o.  Caucasian male with history of PE in 2013 after a long travel and a spontaneous DVT in 2009, now on chronic Xarelto, diabetes mellitus, OSA not on CPAP, CPAP machine has been ordered and they are trying to work with a small piece as of October 2023, rheumatoid and psoriatic  arthritis, ankylosing spondylitis, hypertension,  hyperlipidemia, bicuspid aortic valve, PAD with small vessel disease due to uncontrolled diabetes mellitus,  left bundle branch block and also cardiomyopathy with nonischemic severe LV systolic dysfunction in October 2022. 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  This is a 384-monthffice visit, he is presently doing well and is very well compensated and has class II symptoms of dyspnea.  Blood pressure is well controlled.  He is on maximal tolerated guideline directed medical therapy.  His EF has gradually improved from 20% to the present 35 to 40%.  With regard to diabetes, heart failure, he is also on Farxiga 10 mg daily.  I will repeat echocardiogram in 3 months to follow-up on LVEF.  He has moderate to severe LV systolic dysfunction.  He also has bicuspid aortic valve.  I reviewed his external labs, lipids are not at goal.  I have added Zetia 10 mg daily, he will obtain lipids along with A1c and see me back in 3 months.  Obesity was discussed again at length.  This was a 30-minute office visit encounter with discussions regarding heart failure, sleep-related breathing disorder, diabetes mellitus, obesity.   JaAdrian ProwsMD, FAGriffin Memorial Hospital0/20/2023, 9:33 AM Office: [phone]: [phone]: [phone]

## 2022-06-17 DIAGNOSIS — R260 Ataxic gait: Secondary | ICD-10-CM | POA: Diagnosis not present

## 2022-06-19 DIAGNOSIS — Z79899 Other long term (current) drug therapy: Secondary | ICD-10-CM | POA: Diagnosis not present

## 2022-06-19 DIAGNOSIS — L4050 Arthropathic psoriasis, unspecified: Secondary | ICD-10-CM | POA: Diagnosis not present

## 2022-06-19 DIAGNOSIS — R260 Ataxic gait: Secondary | ICD-10-CM | POA: Diagnosis not present

## 2022-06-24 DIAGNOSIS — L4050 Arthropathic psoriasis, unspecified: Secondary | ICD-10-CM | POA: Diagnosis not present

## 2022-06-24 DIAGNOSIS — E669 Obesity, unspecified: Secondary | ICD-10-CM | POA: Diagnosis not present

## 2022-06-24 DIAGNOSIS — Z6833 Body mass index (BMI) 33.0-33.9, adult: Secondary | ICD-10-CM | POA: Diagnosis not present

## 2022-06-24 DIAGNOSIS — Z79899 Other long term (current) drug therapy: Secondary | ICD-10-CM | POA: Diagnosis not present

## 2022-06-24 DIAGNOSIS — I5020 Unspecified systolic (congestive) heart failure: Secondary | ICD-10-CM | POA: Diagnosis not present

## 2022-06-24 DIAGNOSIS — R260 Ataxic gait: Secondary | ICD-10-CM | POA: Diagnosis not present

## 2022-06-24 DIAGNOSIS — S22080A Wedge compression fracture of T11-T12 vertebra, initial encounter for closed fracture: Secondary | ICD-10-CM | POA: Diagnosis not present

## 2022-06-24 DIAGNOSIS — M1991 Primary osteoarthritis, unspecified site: Secondary | ICD-10-CM | POA: Diagnosis not present

## 2022-06-24 DIAGNOSIS — L409 Psoriasis, unspecified: Secondary | ICD-10-CM | POA: Diagnosis not present

## 2022-06-24 DIAGNOSIS — R7989 Other specified abnormal findings of blood chemistry: Secondary | ICD-10-CM | POA: Diagnosis not present

## 2022-06-26 DIAGNOSIS — R260 Ataxic gait: Secondary | ICD-10-CM | POA: Diagnosis not present

## 2022-07-01 DIAGNOSIS — R260 Ataxic gait: Secondary | ICD-10-CM | POA: Diagnosis not present

## 2022-07-03 DIAGNOSIS — R260 Ataxic gait: Secondary | ICD-10-CM | POA: Diagnosis not present

## 2022-07-10 ENCOUNTER — Encounter: Payer: Self-pay | Admitting: Neurology

## 2022-07-10 ENCOUNTER — Ambulatory Visit (INDEPENDENT_AMBULATORY_CARE_PROVIDER_SITE_OTHER): Payer: Medicare Other | Admitting: Neurology

## 2022-07-10 VITALS — BP 104/56 | HR 66 | Ht 71.0 in | Wt 257.5 lb

## 2022-07-10 DIAGNOSIS — Z9989 Dependence on other enabling machines and devices: Secondary | ICD-10-CM | POA: Diagnosis not present

## 2022-07-10 DIAGNOSIS — I5023 Acute on chronic systolic (congestive) heart failure: Secondary | ICD-10-CM | POA: Diagnosis not present

## 2022-07-10 DIAGNOSIS — I420 Dilated cardiomyopathy: Secondary | ICD-10-CM

## 2022-07-10 DIAGNOSIS — I2782 Chronic pulmonary embolism: Secondary | ICD-10-CM

## 2022-07-10 NOTE — Progress Notes (Signed)
SLEEP MEDICINE CLINIC    Provider:  Larey Seat, MD  Primary Care Physician:  Donnajean Lopes, [place] [address]     Referring Provider: Donnajean Lopes, [place],  [place] [id number]   Primary Neurologist : Narda Amber, DO          Chief Complaint according to patient   Patient presents with:     New Patient (Initial Visit)           HISTORY OF PRESENT ILLNESS:  Anthony Case is a 71 y.o. year old  male patient who is still working full term ,seen here as a referral on 07/10/2022  in a RV on his newer CPAP, with a nasal bridge blister. He recently has seen Dr. Einar Gip, who described him as a 71 year old still gainfully employed Caucasian male with a history of pulmonary embolism in 2013 after a long travel following a spontaneous DVT earlier in 2009 for which she is lost to chronic on Xarelto.  He has a history of diabetes mellitus type 2, obstructive sleep apnea CPAP machine was ordered and there was a delay before he got the new mask- he has been using it since September 2023.  He presents today with blisters on his nasal bridge.  His CPAP machine use was 100% set at 10 cm water pressure was 2 cm EPR and with a residual AHI of 19.1 these are obstructive apneas so the current pressure is not high enough.  I think we need to open the outer titration since again and started from 7 to 14 cm water pressure in order to reduce the obstructive residual. He will receive also a new mask that does spare the nasal bridge I will order an F30 I fullface mask with nasal cradle.   Sleep study 05/20/2022: (Moderate to severe )OSA, frequent periodic limb movements, bradycardia during the study.  Patient treated with CPAP. 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 It is recovered on ENTRESTO to over 40%. Improved SOB.   [name]  has a past medical history of Psoriatic Arthritis, recovering alcoholic, Diabetes mellitus type 2 , on insulin-dx 1998, pulmonary embolism (08/26/2007), Hypertension, Shortness of breath, and Obstructive Sleep apnea on CPAP for decades. He has diabetic neuropathy and skin atrophy, hairless legs, easily bruising.    The patient had the first sleep study in the year 1994 at [facility] and the last one in 08-16-2013 at [facility]:   with a result of an AHI ( Apnea Hypopnea index)  of 43.9/h., resulted by Dr. Danton Sewer.   The patient has been 100% compliant user of his CPAP which was actually set up September 2019 so it is 72 years old at this time and he would have 1 more year to go before we can replace it.  It was replaced without a new sleep test.  His medical conditions however have changed dramatically his CPAP is set at 10 cm of water with 2 cm expiratory pressure relief and is used on average for 7 hours and 55 minutes.  His residual AHI is very high so it is not serving him well the AHI is 19.2.  There are 11.3 obstructive apneas per hour that are not treated with the current setting and his central apnea count is 1.6.  He does have high air leakage which can be improved I think with the visit chinstrap further.  He does not need new supplies. [employer]    Sleep relevant medical history: SOB , Nocturia 0-1, Tonsillectomy, sometimes nocturnal hypoglycemia.  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   and lives in a household with spouse and dog -  Family status is married " Manuela Schwartz", with 3 children ( 30-39) , 1 grandchildren.  The patient currently works irregular hours. Tobacco WGY:KZLDJ.  ETOH use quit 2018,  Caffeine intake in form of  Coffee( /) Soda( dr pepper - 1.5 liters a day) Tea ( /) or energy drinks. Regular exercise in form of walking.  In PT.   Sleep habits are as follows: The patient's dinner time is between 6-7 PM. The patient goes to bed at 11-12 PM and continues to sleep for 7-8 hours, wakes for 1 bathroom break- .   The preferred sleep position is supine, with the support of 3 pillows.  Dreams are reportedly frequent.  7.30  AM is the usual rise time. The patient wakes up spontaneously at 7 AM . He reports not feeling fully refreshed or restored in AM for about 12-18 months, with symptoms such as dry mouth, morning headaches, and residual fatigue.  Naps are taken rarely, but has fallen asleep on Zoom calls-   with loud snoring. Review of Systems: Out of a complete 14 system review, the patient complains of only the following symptoms, and all other reviewed systems are negative.:  Fatigue, sleepiness , snoring, fragmented sleep.   How likely are you to doze in the following situations: 0 = not likely, 1 = slight chance, 2 = moderate chance, 3 = high chance   Sitting and Reading? Watching Television? Sitting inactive in a public place (theater or meeting)? As a passenger in a car for an hour without a break? Lying down in the afternoon when circumstances permit? Sitting and talking to someone? Sitting quietly after lunch without alcohol? In a car, while stopped for a few minutes in traffic?   Total = 5-10 / 24 points , varies day by day.  FSS endorsed at 46/ 63 points.  HIGHER ON ENTRESTO  GDS 4/ 15   Social History   Socioeconomic History   Marital status: Married    Spouse name: Not on file   Number of children: 3   Years of education: 18   Highest education level: Manuela Schwartz, a Social worker   Occupational History   Occupation: Optometrist- working for himself.     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   Social Determinants of Radio broadcast assistant Strain: Not on file  Food Insecurity: Not on file  Transportation Needs: Not on file  Physical Activity: Not on file  Stress: Not on file  Social Connections: Not on file    Family History  Problem Relation Age of Onset   Heart disease Mother    Schizophrenia Mother    Heart disease Father    Diabetes Father    Heart disease Brother     Past Medical History:  Diagnosis Date   Arthritis    Diabetes mellitus    Hx pulmonary embolism 08/26/2007   required emergent tPA treatment   Hypertension    Shortness of breath    Sleep apnea     Past Surgical History:  Procedure Laterality Date   BACK SURGERY     COLONOSCOPY  2009   adenoma polyp   CYSTOSCOPY/URETEROSCOPY/HOLMIUM LASER/STENT PLACEMENT Left 07/02/2021   Procedure: CYSTOSCOPY/RETROGRADE/URETEROSCOPY/HOLMIUM LASER/STENT PLACEMENT;  Surgeon: Festus Aloe, MD;  Location: [facility];  Service: Urology;  Laterality: Left;   ELBOW ARTHROSCOPY  2010   IR RADIOLOGIST EVAL & MGMT  12/25/2020   IR RADIOLOGIST EVAL & MGMT  12/28/2020   LUMBAR PERCUTANEOUS PEDICLE SCREW 3 LEVEL N/A 05/25/2013   Procedure: Posterior Percutaneous Fixation from Thoracic nine to Lumbar one vertebrae with arthrodesis of Thoracic eleven Fracture;  Surgeon: Kristeen Miss, MD;  Location: [facility];  Service: Neurosurgery;  Laterality: N/A;  Posterior Percutaneous Fixation from Thoracic nine to Lumbar one vertebrae with arthrodesis of Thoracic eleven Fracture   RIGHT/LEFT HEART CATH AND CORONARY ANGIOGRAPHY N/A 08/27/2021    Procedure: RIGHT/LEFT HEART CATH AND CORONARY ANGIOGRAPHY;  Surgeon: Adrian Prows, MD;  Location: [facility];  Service: Cardiovascular;  Laterality: N/A;     Current Outpatient Medications on File Prior to Visit  Medication Sig Dispense Refill   acetaminophen (TYLENOL) 500 MG tablet Take 1,000 mg by mouth every 6 (six) hours as needed for moderate pain.     Ascorbic Acid (VITAMIN C) 1000 MG tablet Take 1,000 mg by mouth daily.     atorvastatin (LIPITOR) 20 MG tablet Take 20 mg by mouth daily.     augmented betamethasone dipropionate (DIPROLENE-AF) 0.05 % cream Apply 1 Application topically in the morning and at bedtime.     cholecalciferol (VITAMIN D3) 25 MCG (1000 UNIT) tablet Take 1,000 Units by mouth daily.     dapagliflozin propanediol (FARXIGA) 10 MG TABS tablet Take 10 mg by mouth daily.     ezetimibe (ZETIA) 10 MG tablet Take 1 tablet (10 mg total) by mouth daily. 90 tablet 3   glipiZIDE (GLUCOTROL XL) 2.5 MG 24 hr tablet Take 5 mg by mouth daily with breakfast.     HYDROcodone-acetaminophen (NORCO/VICODIN) 5-325 MG tablet Take 1 tablet by mouth every 6 (six) hours as needed for moderate pain.     LEVEMIR FLEXTOUCH 100 UNIT/ML FlexPen Inject 60 Units into the skin every evening.     metFORMIN (GLUCOPHAGE) 500 MG tablet Take 500 mg by mouth 2 (two) times daily with a meal.     metoprolol succinate (TOPROL-XL) 50 MG 24 hr tablet Take 1 tablet (50 mg total) by mouth daily. 90 tablet 1   Multiple Vitamin (MULTIVITAMIN WITH MINERALS) TABS tablet Take 1 tablet by mouth daily.     Omega-3 Fatty Acids (FISH OIL) 1000 MG CAPS Take 1,000 mg by mouth daily.  rivaroxaban (XARELTO) 20 MG TABS tablet Take 1 tablet (20 mg total) by mouth daily. 08/04/13 last dose of xarelto in preparation for colonoscopy 30 tablet    sacubitril-valsartan (ENTRESTO) 97-103 MG Take 1 tablet by mouth 2 (two) times daily. 180 tablet 2   No current facility-administered medications on file prior to visit.     Allergies  Allergen Reactions   Lisinopril Cough    Physical exam:  Today's Vitals   07/10/22 0944  BP: (!) 104/56  Pulse: 66  Weight: 257 lb 8 oz (116.8 kg)  Height: '5\' 11"'$  (1.803 m)   Body mass index is 35.91 kg/m.   Wt Readings from Last 3 Encounters:  07/10/22 257 lb 8 oz (116.8 kg)  06/13/22 255 lb (115.7 kg)  04/23/22 256 lb 3.2 oz (116.2 kg)     Ht Readings from Last 3 Encounters:  07/10/22 '5\' 11"'$  (1.803 m)  06/13/22 '5\' 11"'$  (1.803 m)  04/23/22 '5\' 11"'$  (1.803 m)      General: The patient is awake, alert and appears not in acute distress. The patient is well groomed. Head: Normocephalic, atraumatic. Neck is supple.  Mallampati: 3,  neck circumference:19.5 inches . Nasal airflow congested. Dental status: biological  Cardiovascular:  Regular rate and cardiac rhythm by pulse,  without distended neck veins. Respiratory: Lungs are clear to auscultation.  Skin:  Without evidence of ankle edema,but petechiae.  Trunk: The patient's posture is erect.   Neurologic exam : The patient is awake and alert, oriented to place and time.   Memory subjective described as intact.  Attention span & concentration ability appears normal.  Speech is fluent,  without  dysarthria, dysphonia or aphasia.  Mood and affect are appropriate.   Cranial nerves: no loss of smell or taste reported  Pupils are equal and briskly reactive to light. Funduscopic exam deferred. .  Extraocular movements in vertical and horizontal planes were intact and without nystagmus. No Diplopia.  Facial motor strength is symmetric and tongue and uvula move midline.  Neck ROM : rotation, tilt and flexion extension were normal for age and shoulder shrug was symmetrical.    Motor exam:  Symmetric bulk, tone and ROM.  He works on Location manager.  Normal tone without cog-wheeling, symmetric grip strength .   Sensory:  deferred.    Coordination: Rapid alternating movements in the fingers/hands were of normal  speed.  The Finger-to-nose maneuver was intact without evidence of ataxia, dysmetria or tremor.   Gait and station: Patient could rise unassisted from a seated position, walked without assistive device.  Stance is of normal width/ base Toe and heel walk were deferred.   Neuropathy related gait stability impairment.  Deep tendon reflexes: in the upper and lower extremities are symmetric and intact.  Babinski response was deferred.       After spending a total time of  30  minutes face to face and additional time for physical and neurologic examination, review of laboratory studies,  personal review of imaging studies, reports and results of other testing and review of referral information / records as far as provided in visit, I have established the following assessments:   IMPRESSION:  SLEEP APNEA : This study documented moderate-severe sleep apnea, at an AHI of 20/h. At baseline these events were associated with frequent oxyhemoglobin desaturation.  This study may underestimate the severity of apnea, as REM sleep was reduced during the recording prior to the initiation of PAP therapy. All sleep in both parts of the study  was supine.  The patient used a familiar mask/ interface and was titrated to 15 cm water CPAP with 2 cm EPR. This setting did not prevent all AH events in supine REM sleep but worked well in NREM supine sleep. AHI under CPAP at 15 cm water c 2 cm EPR was 4/h. these were hypopneas, cyclic pattern. A medium sized Fisher and Paykel SIMPLUS FFM with a chin strap was used.    Frequent periodic limb movements (PLMs) of sleep were observed but were reduced under CPAP .   Continuous arrhythmia was noted, with bradycardia.   RECOMMENDATIONS: AHI under 15 cm water CPAP pressure with 2 cm EPR was 4/h A medium sized Fisher and Paykel SIMPLUS FFM with a chin strap was used- this is the patient current mask but needs adjustments to his home CPAP.     We will revisit with the patient  after 30 and before 90 days of treatment on the new settings are over, evaluate if FSS and Epworth are decreasing.    Larey Seat, MD      Assessments :   1) Current CPAP is 61.72 years old and no longer working, HIGH RESIDUAL AHI- and his medical history has changed significantly. he has been using a new mask finally since September 2023.  He presents today with blisters on his nasal bridge.  His CPAP machine use was 100% set at 10 cm water pressure was 2 cm EPR and with a residual AHI of 19.1/h  these are obstructive apneas so the current pressure is not high enough.  I think we need to open the outer titration since again and started from 7 to 14 cm water pressure in order to reduce the obstructive residual. He will receive also a new mask that does spare the nasal bridge I will order an F30 I fullface mask with nasal cradle.     My Plan is to proceed with: NEW autotitration CPAP device- supine sleeper- 6 - 16 cm water 2 cm EPR.   50 pound weight loss, new CHF dx and progressive DM, his current CPAP cannot treat the condition successfully and needs to be replaced. He will receive an F 30 I mask.  Tried unsuccessfully to use a chin strap !  May use a mouth tape.  I will also ask for gecko skin to try out on the bridge of his nose.       I plan to follow up either personally or through our NP within 3 months after new CPAP and mask.   CC: I will share my notes with referring MD , PCP.  Electronically signed by: Larey Seat, MD 07/10/2022 10:19 AM  [facility] Board certified by The [employer] of Sleep Medicine and Diplomate of the [employer] of Sleep Medicine. Board certified In Neurology through the [place], Fellow of the [employer] of Neurology. Medical Director of [facility].

## 2022-07-10 NOTE — Patient Instructions (Addendum)
he has been using a new mask finally since September 2023.  He presents today with blisters on his nasal bridge.  His CPAP machine use was 100% set at 10 cm water pressure was 2 cm EPR and with a residual AHI of 19.1 these are obstructive apneas so the current pressure is not high enough.  I think we need to open the outer titration since again and started from 7 to 14 cm water pressure in order to reduce the obstructive residual. He will receive also a new mask that does spare the nasal bridge I will order an F30 I fullface mask with nasal cradle.  My Plan is to proceed with: NEW autotitration CPAP device- supine sleeper- 6 - 16 cm water 2 cm EPR.   50 pound weight loss, new CHF dx and progressive DM, his current CPAP cannot treat the condition successfully and needs to be replaced. He will receive an F 30 I mask.  Tried unsuccessfully to use a chin strap !  May use a mouth tape.  I will also ask for gecko skin to try out on the bridge of his nose.

## 2022-07-14 ENCOUNTER — Telehealth: Payer: Self-pay

## 2022-07-15 DIAGNOSIS — R260 Ataxic gait: Secondary | ICD-10-CM | POA: Diagnosis not present

## 2022-07-22 DIAGNOSIS — R5383 Other fatigue: Secondary | ICD-10-CM | POA: Diagnosis not present

## 2022-07-22 DIAGNOSIS — R260 Ataxic gait: Secondary | ICD-10-CM | POA: Diagnosis not present

## 2022-07-22 DIAGNOSIS — L4050 Arthropathic psoriasis, unspecified: Secondary | ICD-10-CM | POA: Diagnosis not present

## 2022-07-22 DIAGNOSIS — Z79899 Other long term (current) drug therapy: Secondary | ICD-10-CM | POA: Diagnosis not present

## 2022-07-22 DIAGNOSIS — Z111 Encounter for screening for respiratory tuberculosis: Secondary | ICD-10-CM | POA: Diagnosis not present

## 2022-07-24 DIAGNOSIS — R260 Ataxic gait: Secondary | ICD-10-CM | POA: Diagnosis not present

## 2022-07-28 ENCOUNTER — Encounter: Payer: Self-pay | Admitting: Neurology

## 2022-07-29 DIAGNOSIS — R260 Ataxic gait: Secondary | ICD-10-CM | POA: Diagnosis not present

## 2022-07-31 DIAGNOSIS — R260 Ataxic gait: Secondary | ICD-10-CM | POA: Diagnosis not present

## 2022-08-06 ENCOUNTER — Other Ambulatory Visit: Payer: Self-pay

## 2022-08-06 ENCOUNTER — Telehealth: Payer: Self-pay

## 2022-08-06 MED ORDER — ENTRESTO 97-103 MG PO TABS: 1.0000 | ORAL_TABLET | Freq: Two times a day (BID) | ORAL | 3 refills | Status: DC

## 2022-08-06 NOTE — Telephone Encounter (Signed)
done

## 2022-08-06 NOTE — Telephone Encounter (Signed)
Lmom advising pt printed rx ready for pickup

## 2022-08-06 NOTE — Telephone Encounter (Signed)
Pt need printed script for Entresto 90 day 4rf. Please call when ready for pickup

## 2022-08-07 DIAGNOSIS — E113391 Type 2 diabetes mellitus with moderate nonproliferative diabetic retinopathy without macular edema, right eye: Secondary | ICD-10-CM | POA: Diagnosis not present

## 2022-08-07 DIAGNOSIS — H2513 Age-related nuclear cataract, bilateral: Secondary | ICD-10-CM | POA: Diagnosis not present

## 2022-08-07 DIAGNOSIS — R260 Ataxic gait: Secondary | ICD-10-CM | POA: Diagnosis not present

## 2022-08-12 DIAGNOSIS — R260 Ataxic gait: Secondary | ICD-10-CM | POA: Diagnosis not present

## 2022-08-26 DIAGNOSIS — R260 Ataxic gait: Secondary | ICD-10-CM | POA: Diagnosis not present

## 2022-08-27 ENCOUNTER — Ambulatory Visit: Payer: Medicare Other

## 2022-08-27 DIAGNOSIS — I428 Other cardiomyopathies: Secondary | ICD-10-CM | POA: Diagnosis not present

## 2022-08-27 DIAGNOSIS — I5022 Chronic systolic (congestive) heart failure: Secondary | ICD-10-CM | POA: Diagnosis not present

## 2022-08-28 DIAGNOSIS — R260 Ataxic gait: Secondary | ICD-10-CM | POA: Diagnosis not present

## 2022-08-28 DIAGNOSIS — L4050 Arthropathic psoriasis, unspecified: Secondary | ICD-10-CM | POA: Diagnosis not present

## 2022-09-01 NOTE — Progress Notes (Signed)
Echocardiogram 08/27/2022:   Left ventricle cavity is normal in size. Moderate concentric hypertrophy of the left ventricle. Hypokinetic global wall motion. Abnormal septal wall motion due to left bundle branch block. Doppler evidence of grade I (impaired) diastolic dysfunction, normal LAP. Mildly depressed LV systolic function with visual EF 45-50%.  Calculated EF 56%. Right atrial cavity is mildly dilated. Trileaflet aortic valve with no regurgitation. Mild aortic valve leaflet thickening with sclerosis. Structurally normal tricuspid valve.  Mild tricuspid regurgitation. No evidence of pulmonary hypertension. 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 Compared to 02/2022, EF has slightly improved from 35-40% to now 45-50%.

## 2022-09-03 NOTE — Progress Notes (Unsigned)
Primary Physician/Referring:  Donnajean Lopes, MD  Patient ID: Anthony Case, male    DOB: Jul 02, 1951, 72 y.o.   MRN: [id number]  No chief complaint on file.  HPI:    Anthony Case  is a 72 y.o.  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  Patient is here on a 89-monthoffice visit, states that he has had a sleep study which showed severe sleep apnea and they are trying to fit a mask that would fit him.  He is otherwise doing well, he has not had any further episodes of leg edema or worsening dyspnea, he has not used any furosemide.  No chest pain, dizziness, palpitations or syncope.  Past Medical History:  Diagnosis Date   Arthritis    Diabetes mellitus    Hx pulmonary embolism 08/26/2007   required emergent tPA treatment   Hypertension    Shortness of breath    Sleep apnea    Social History   Tobacco Use   Smoking status: Never   Smokeless tobacco: Never  Substance Use Topics   Alcohol use: Not Currently    Comment: Previously drinking 3-5 drinks nightly x 20 year   Marital Status: Married  ROS  Review of Systems  Cardiovascular:  Positive for claudication. Negative for chest pain, dyspnea on exertion and leg swelling.  Neurological:  Positive for paresthesias (bilateral feet).   Objective  There were no vitals taken for this visit. There is no height or weight on file to calculate BMI.      07/10/2022    9:44 AM 06/13/2022    9:19 AM 04/23/2022    9:02 AM  Vitals with BMI  Height '5\' 11"'$  '5\' 11"'$  '5\' 11"'$   Weight 257 lbs 8 oz 255 lbs 256 lbs 3 oz  BMI 35.93 [id number] Systolic [id number] Diastolic 56 71 70  Pulse 66 66 65    Physical Exam Neck:     Vascular: No carotid bruit or JVD.  Cardiovascular:     Rate and Rhythm: Normal rate and regular rhythm.     Pulses:          Carotid pulses are 2+ on the right side and 2+ on the left side.      Femoral pulses are 2+ on the right side and 2+ on the left side.      Popliteal pulses are 2+ on the right side and 2+ on the left side.       Dorsalis pedis pulses are 0 on the right side and 2+ on the left side.       Posterior tibial pulses are 0 on the right side and 0 on the left side.     Heart sounds: Heart sounds are distant. No murmur heard.    No gallop.  Pulmonary:     Effort: Pulmonary effort is normal.     Breath sounds: Normal breath sounds.  Musculoskeletal:  General: No swelling.     Medications and allergies   Allergies  Allergen Reactions   Lisinopril Cough    Medication list after today's encounter    Current Outpatient Medications:    acetaminophen (TYLENOL) 500 MG tablet, Take 1,000 mg by mouth every 6 (six) hours as needed for moderate pain., Disp: , Rfl:    Ascorbic Acid (VITAMIN C) 1000 MG tablet, Take 1,000 mg by mouth daily., Disp: , Rfl:    atorvastatin (LIPITOR) 20 MG tablet, Take 20 mg by mouth daily., Disp: , Rfl:    augmented betamethasone dipropionate (DIPROLENE-AF) 0.05 % cream, Apply 1 Application topically in the morning and at bedtime., Disp: , Rfl:    cholecalciferol (VITAMIN D3) 25 MCG (1000 UNIT) tablet, Take 1,000 Units by mouth daily., Disp: , Rfl:    dapagliflozin propanediol (FARXIGA) 10 MG TABS tablet, Take 10 mg by mouth daily., Disp: , Rfl:    ezetimibe (ZETIA) 10 MG tablet, Take 1 tablet (10 mg total) by mouth daily., Disp: 90 tablet, Rfl: 3   glipiZIDE  (GLUCOTROL XL) 2.5 MG 24 hr tablet, Take 5 mg by mouth daily with breakfast., Disp: , Rfl:    HYDROcodone-acetaminophen (NORCO/VICODIN) 5-325 MG tablet, Take 1 tablet by mouth every 6 (six) hours as needed for moderate pain., Disp: , Rfl:    LEVEMIR FLEXTOUCH 100 UNIT/ML FlexPen, Inject 60 Units into the skin every evening., Disp: , Rfl:    metFORMIN (GLUCOPHAGE) 500 MG tablet, Take 500 mg by mouth 2 (two) times daily with a meal., Disp: , Rfl:    metoprolol succinate (TOPROL-XL) 50 MG 24 hr tablet, Take 1 tablet (50 mg total) by mouth daily., Disp: 90 tablet, Rfl: 1   Multiple Vitamin (MULTIVITAMIN WITH MINERALS) TABS tablet, Take 1 tablet by mouth daily., Disp: , Rfl:    Omega-3 Fatty Acids (FISH OIL) 1000 MG CAPS, Take 1,000 mg by mouth daily., Disp: , Rfl:    rivaroxaban (XARELTO) 20 MG TABS tablet, Take 1 tablet (20 mg total) by mouth daily. 08/04/13 last dose of xarelto in preparation for colonoscopy, Disp: 30 tablet, Rfl:    sacubitril-valsartan (ENTRESTO) 97-103 MG, Take 1 tablet by mouth 2 (two) times daily., Disp: 180 tablet, Rfl: 3  Laboratory examination:   Recent Labs    11/08/21 1358  NA 140  K 4.7  CL 108  CO2 24  GLUCOSE 179*  BUN 23  CREATININE 1.12  CALCIUM 9.5  GFRNONAA >60    Lab Results  Component Value Date   ALT 25 11/08/2021   AST 27 11/08/2021   ALKPHOS 47 11/08/2021   BILITOT 1.2 11/08/2021      Latest Ref Rng & Units 11/08/2021    1:58 PM 08/27/2021    2:12 PM 08/27/2021    2:03 PM  CBC  WBC 4.0 - 10.5 K/uL 7.7     Hemoglobin 13.0 - 17.0 g/dL 15.2  12.6  12.2   Hematocrit 39.0 - 52.0 % 45.7  37.0  36.0   Platelets 150 - 400 K/uL 254       HEMOGLOBIN A1C Lab Results  Component Value Date   HGBA1C 7.4 (H) 06/25/2021   MPG 165.68 06/25/2021    Laboratory examination:   External labs:   Labs 06/13/2022:  A1c 6.1%.  Urinary protein negative.  Vitamin D 32.5.  Labs 10/09/2021:  Total cholesterol 153, triglycerides 172, HDL 30, LDL 89.   Non-HDL cholesterol [place] 23.  S. Glu 140 mg, BUN 21, creatinine 1.0, EGFR  73 mL, potassium 4.2, LFTs normal.  A1C 7.400 % 06/25/2021  TSH 1.270 02/22/2019  Radiology   CT angiogram chest 12/16/2017: No evidence of pulmonary embolus. Dependent atelectasis. Cardiovascular: No filling defects in the pulmonary arteries to suggest pulmonary emboli. Insert Heart diffuse coronary artery calcifications. Scattered aortic calcifications. Extensive coronary artery calcifications/disease.  CT Angiogram of the abdomen with bilateral lower extremity angiogram 12/26/2020: Aortic atherosclerosis.  Aortic Atherosclerosis (ICD10-I70.0).   Minimal aortoiliac disease with no high-grade stenosis or occlusion. Hypogastric arteries remain patent as well as the pelvic arteries.  AAA.   Relatively symmetric mild atherosclerosis of the femoropopliteal segment.   Bilateral tibial and pedal circumferential calcifications, limiting evaluation of the tibial artery secondary to blooming artifact from the calcium.  Cardiac Studies:   Sleep study 05/20/2022: (Moderate to severe )OSA, frequent periodic limb movements, bradycardia during the study.  Patient treated with CPAP.  Lexiscan myoview stress test 12/25/2017: 1. Lexiscan stress test performed. Exercise capacity was not assessed. Stress symptoms included dyspnea, dizziness. Peak BP 154/96 mmHg. The resting electrocardiogram demonstrated normal sinus rhythm, normal resting conduction, VPC and normal rest repolarization. Stress EKG is non diagnostic for ischemia as it is a pharmacologic stress. 2. The overall quality of the study is excellent. There is no evidence of abnormal lung activity. Stress and rest SPECT images demonstrate homogeneous tracer distribution throughout the myocardium. Gated SPECT imaging reveals normal myocardial thickening and wall motion. The left ventricular ejection fraction was normal (55%). Small apical defect likely related to  subdiaphragmatic attenuation. 3. Low risk study.  ABI 12/12/2020: Right: Resting right ankle-brachial index indicates noncompressible right lower extremity arteries. The right toe-brachial index is abnormal.  Pedal artery waveforms indicate adequate flow at rest.   Left: Resting left ankle-brachial index indicates noncompressible left lower extremity arteries. The left toe-brachial index is normal.  Pedal artery waveforms indicate adequate flow at rest.   Right and left heart catheterization 08/27/2021: RA 4/3, mean 2 mmHg RV 27/0, EDP 4 mmHg PA 30/10, mean 17 mmHg PW 11/11, mean 9 mmHg. LV 151/8, EDP 18 mmHg.  Ao 145/74, mean 99 mmHg.  No significant pressure gradient across the aortic valve. LVEF  30 to 35% with global hypokinesis.  No significant mitral regurgitation. LM: Mildly calcified but normal. LAD: Large vessel, mild to moderately calcified, there are tandem 40% stenosis in the midsegment due to calcification but without evidence of high-grade stenosis.  Large D1 and D2. CX: Large vessel.  Mild diffuse disease.  Large OM1. RI: Large vessel.  Smooth and normal. RCA: Very large caliber vessel.  Mild diffuse calcification noted.  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  60 mL contrast utilized.  PCV ECHOCARDIOGRAM COMPLETE 08/27/2022  Narrative Echocardiogram 08/27/2022: Mildly depressed LV systolic function with visual EF 45-50%. Left ventricle cavity is normal in size. Moderate concentric hypertrophy of the left ventricle. Hypokinetic global wall motion. Abnormal septal wall motion due to left bundle branch block. Doppler evidence of grade I (impaired) diastolic dysfunction, normal LAP. Calculated EF 56%. Right atrial cavity is mildly dilated. Trileaflet aortic valve with no regurgitation. Mild aortic valve leaflet thickening with sclerosis. Structurally normal tricuspid valve.  Mild tricuspid  regurgitation. No evidence of pulmonary hypertension. The aortic root is dilated. Compared to 02/2022, EF has slightly improved from 35-40% to now 45-50%.       EKG:     EKG 03/14/2022: Sinus rhythm with first-degree AV block at rate of 61 bpm, left bundle branch  block.  Frequent PVCs (3).  Compared to 07/07/2021, frequent PVCs is new.  Compared to 06/20/2020, LBBB  Assessment     ICD-10-CM   1. Chronic systolic heart failure (HCC)  I50.22     2. Non-ischemic cardiomyopathy ([place])  I42.8     3. LBBB (left bundle branch block)  I44.7     4. Bicuspid aortic valve  Q23.1       There are no discontinued medications.   No orders of the defined types were placed in this encounter.  No orders of the defined types were placed in this encounter.  Recommendations:   Anthony Case is a 72 y.o.  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  This is a 61-monthoffice visit, he is presently doing well and is very well compensated and has class II symptoms of dyspnea.  Blood pressure is well controlled.  He is on maximal tolerated guideline directed medical therapy.  His EF has gradually improved from 20% to the present 35 to 40%.  With regard to diabetes, heart failure, he is also on Farxiga 10 mg daily.  I will repeat echocardiogram in 3 months to follow-up on LVEF.  He has moderate to severe LV systolic dysfunction.   He also has bicuspid aortic valve.  I reviewed his external labs, lipids are not at goal.  I have added Zetia 10 mg daily, he will obtain lipids along with A1c and see me back in 3 months.  Obesity was discussed again at length.  This was a 30-minute office visit encounter with discussions regarding heart failure, sleep-related breathing disorder, diabetes mellitus, obesity.   JAdrian Prows MD, FUnasource Surgery Center1/05/2023, 8:58 PM Office: [phone]: [phone]: [phone]

## 2022-09-04 ENCOUNTER — Encounter: Payer: Self-pay | Admitting: Cardiology

## 2022-09-04 ENCOUNTER — Ambulatory Visit: Payer: Medicare Other | Admitting: Cardiology

## 2022-09-04 VITALS — BP 121/65 | HR 54 | Ht 71.0 in | Wt 257.2 lb

## 2022-09-04 DIAGNOSIS — I5022 Chronic systolic (congestive) heart failure: Secondary | ICD-10-CM

## 2022-09-04 DIAGNOSIS — I428 Other cardiomyopathies: Secondary | ICD-10-CM

## 2022-09-04 DIAGNOSIS — R260 Ataxic gait: Secondary | ICD-10-CM | POA: Diagnosis not present

## 2022-09-04 DIAGNOSIS — Q2381 Bicuspid aortic valve: Secondary | ICD-10-CM

## 2022-09-04 DIAGNOSIS — Q231 Congenital insufficiency of aortic valve: Secondary | ICD-10-CM | POA: Diagnosis not present

## 2022-09-04 DIAGNOSIS — I447 Left bundle-branch block, unspecified: Secondary | ICD-10-CM | POA: Diagnosis not present

## 2022-09-11 DIAGNOSIS — R260 Ataxic gait: Secondary | ICD-10-CM | POA: Diagnosis not present

## 2022-09-16 DIAGNOSIS — R260 Ataxic gait: Secondary | ICD-10-CM | POA: Diagnosis not present

## 2022-09-18 DIAGNOSIS — R260 Ataxic gait: Secondary | ICD-10-CM | POA: Diagnosis not present

## 2022-09-25 DIAGNOSIS — L4050 Arthropathic psoriasis, unspecified: Secondary | ICD-10-CM | POA: Diagnosis not present

## 2022-09-25 DIAGNOSIS — N400 Enlarged prostate without lower urinary tract symptoms: Secondary | ICD-10-CM | POA: Diagnosis not present

## 2022-09-25 DIAGNOSIS — R972 Elevated prostate specific antigen [PSA]: Secondary | ICD-10-CM | POA: Diagnosis not present

## 2022-09-25 DIAGNOSIS — N200 Calculus of kidney: Secondary | ICD-10-CM | POA: Diagnosis not present

## 2022-10-02 ENCOUNTER — Encounter: Payer: Self-pay | Admitting: Neurology

## 2022-10-02 ENCOUNTER — Ambulatory Visit (INDEPENDENT_AMBULATORY_CARE_PROVIDER_SITE_OTHER): Payer: Medicare Other | Admitting: Neurology

## 2022-10-02 VITALS — BP 130/87 | HR 77 | Ht 71.0 in | Wt 247.0 lb

## 2022-10-02 DIAGNOSIS — G4733 Obstructive sleep apnea (adult) (pediatric): Secondary | ICD-10-CM | POA: Diagnosis not present

## 2022-10-02 NOTE — Patient Instructions (Signed)
Very nice to meet you today!  I am going to send an order to start with the switch to AutoPap 6-16 cmH2O range, I will contact you in about 4 weeks to bring your machine in for card download

## 2022-10-02 NOTE — Progress Notes (Signed)
Faxed new orders to [facility]

## 2022-10-02 NOTE — Progress Notes (Signed)
Patient: Anthony Case Date of Birth: March 24, 1951  Reason for Visit: Follow up History from: Patient Primary Neurologist: Anthony Case   ASSESSMENT AND PLAN 72 y.o. year old male   41.  Obstructive sleep apnea, moderate to severe  -Switch to auto pap, 6-16 EPR 2, continue nasal mask with mouth tape (current pressure 15, EPR 2, AHI 15.3) -He is doing supine sleep, which impacts his AHI likely, try his best to avoid -His machine is a card download, I have sent myself a reminder to contact him in about 4 weeks to bring the machine in, we can pull download, he may require a higher end range for set pressure  -He will be due for a new CPAP machine in September of this year, will arrange for 33-monthrevisit with Anthony Case-He is very diligent and motivated to continue his CPAP use  HISTORY OF PRESENT ILLNESS: Today 10/02/22 Here today for CPAP visit.  My best review of his prior visits with Anthony Case in November 2023 he was experiencing nasal blisters with a set CPAP pressure of 10 cm water.  Residual AHI 19.1, obstructive apnea so the current pressure was not high enough.  She had intended to switch to AutoPap, ordered new CPAP machine, per note says 7-14 cmH2O, but the order was for 6 to 16 cm water.  Ordered a new nasal mask.  Of note, he has been using CPAP for decades.  I am seeing him today, he is now using a nasal mask versus full face.  He just got this mask 2 weeks ago, the nasal blister has healed.  He did try using a gel nasal barrier without benefit.  His CPAP machine has a card download.  Review of CPAP download 09/02/22-10/01/22 showed overall excellent compliance greater than 4 hours 30 days at 100%.  Average usage 7 hours and 40 minutes.  Set pressure 15 cm water.  Leak 20.4.  AHI 15.3 (1.0 central, 10.1 obstructive, 0.6 unknown). ESS 4.  He is using a mouth tape, nasal mask.  He clearly has benefit as far as feeling better with CPAP use, there are rare times he may  travel and notices he feels terrible the next day without the CPAP.  HISTORY 07/10/22 Anthony Case Anthony COURYis a 72y.o. year old  male patient who is still working full term ,seen here as a referral on 07/10/2022  in a RV on his newer CPAP, with a nasal bridge blister. He recently has seen Anthony Case who described him as a 72year old still gainfully employed Caucasian male with a history of pulmonary embolism in 2013 after a long travel following a spontaneous DVT earlier in 2009 for which she is lost to chronic on Xarelto.  He has a history of diabetes mellitus type 2, obstructive sleep apnea CPAP machine was ordered and there was a delay before he got the new mask- he has been using it since September 2023.  He presents today with blisters on his nasal bridge.  His CPAP machine use was 100% set at 10 cm water pressure was 2 cm EPR and with a residual AHI of 19.1 these are obstructive apneas so the current pressure is not high enough.  I think we need to open the outer titration since again and started from 7 to 14 cm water pressure in order to reduce the obstructive residual. He will receive also a new mask that does spare the nasal bridge I will order an F30 I  fullface mask with nasal cradle.   Sleep study 05/20/2022: (Moderate to severe )OSA, frequent periodic limb movements, bradycardia during the study.  Patient treated with CPAP. He is compliant with CPAP use  REVIEW OF SYSTEMS: Out of a complete 14 system review of symptoms, the patient complains only of the following symptoms, and all other reviewed systems are negative.  See HPI  ALLERGIES: Allergies  Allergen Reactions   Ace Inhibitors Other (See Comments)   Lisinopril Cough    HOME MEDICATIONS: Outpatient Medications Prior to Visit  Medication Sig Dispense Refill   acetaminophen (TYLENOL) 500 MG tablet Take 1,000 mg by mouth every 6 (six) hours as needed for moderate pain.     Ascorbic Acid (VITAMIN C) 1000 MG tablet  Take 1,000 mg by mouth daily.     atorvastatin (LIPITOR) 20 MG tablet Take 20 mg by mouth daily.     augmented betamethasone dipropionate (DIPROLENE-AF) 0.05 % cream Apply 1 Application topically in the morning and at bedtime.     cholecalciferol (VITAMIN D3) 25 MCG (1000 UNIT) tablet Take 1,000 Units by mouth daily.     dapagliflozin propanediol (FARXIGA) 10 MG TABS tablet Take 10 mg by mouth daily.     ezetimibe (ZETIA) 10 MG tablet Take 1 tablet (10 mg total) by mouth daily. 90 tablet 3   glipiZIDE (GLUCOTROL XL) 2.5 MG 24 hr tablet Take 5 mg by mouth daily with breakfast.     HYDROcodone-acetaminophen (NORCO/VICODIN) 5-325 MG tablet Take 1 tablet by mouth every 6 (six) hours as needed for moderate pain.     Insulin Degludec (TRESIBA FLEXTOUCH [place]) Inject 60 Units into the skin daily at 2 PM.     metFORMIN (GLUCOPHAGE) 500 MG tablet Take 500 mg by mouth 2 (two) times daily with a meal.     metoprolol succinate (TOPROL-XL) 50 MG 24 hr tablet Take 1 tablet (50 mg total) by mouth daily. 90 tablet 1   Multiple Vitamin (MULTIVITAMIN WITH MINERALS) TABS tablet Take 1 tablet by mouth daily.     Omega-3 Fatty Acids (FISH OIL) 1000 MG CAPS Take 1,000 mg by mouth daily.     rivaroxaban (XARELTO) 20 MG TABS tablet Take 1 tablet (20 mg total) by mouth daily. 08/04/13 last dose of xarelto in preparation for colonoscopy 30 tablet    sacubitril-valsartan (ENTRESTO) 97-103 MG Take 1 tablet by mouth 2 (two) times daily. 180 tablet 3   Vitamin D, Ergocalciferol, 50000 units CAPS Take 1 capsule by mouth once a week Oral     No facility-administered medications prior to visit.    PAST MEDICAL HISTORY: Past Medical History:  Diagnosis Date   Arthritis    Diabetes mellitus    Hx pulmonary embolism 08/26/2007   required emergent tPA treatment   Hypertension    Shortness of breath    Sleep apnea     PAST SURGICAL HISTORY: Past Surgical History:  Procedure Laterality Date   BACK SURGERY     COLONOSCOPY   2009   adenoma polyp   CYSTOSCOPY/URETEROSCOPY/HOLMIUM LASER/STENT PLACEMENT Left 07/02/2021   Procedure: CYSTOSCOPY/RETROGRADE/URETEROSCOPY/HOLMIUM LASER/STENT PLACEMENT;  Surgeon: Anthony Aloe, MD;  Location: [facility];  Service: Urology;  Laterality: Left;   ELBOW ARTHROSCOPY  2010   IR RADIOLOGIST EVAL & MGMT  12/25/2020   IR RADIOLOGIST EVAL & MGMT  12/28/2020   LUMBAR PERCUTANEOUS PEDICLE SCREW 3 LEVEL N/A 05/25/2013   Procedure: Posterior Percutaneous Fixation from Thoracic nine to Lumbar one vertebrae with arthrodesis of Thoracic eleven Fracture;  Surgeon: Anthony Miss, MD;  Location: [facility];  Service: Neurosurgery;  Laterality: N/A;  Posterior Percutaneous Fixation from Thoracic nine to Lumbar one vertebrae with arthrodesis of Thoracic eleven Fracture   RIGHT/LEFT HEART CATH AND CORONARY ANGIOGRAPHY N/A 08/27/2021   Procedure: RIGHT/LEFT HEART CATH AND CORONARY ANGIOGRAPHY;  Surgeon: Anthony Prows, MD;  Location: [place] [facility];  Service: Cardiovascular;  Laterality: N/A;    FAMILY HISTORY: Family History  Problem Relation Age of Onset   Heart disease Mother    Schizophrenia Mother    Heart disease Father    Diabetes Father    Heart disease Brother     SOCIAL HISTORY: Social History   Socioeconomic History   Marital status: Married    Spouse name: Not on file   Number of children: 3   Years of education: 18   Highest education level: Not on file  Occupational History   Occupation: Surveyor, mining: [employer]  Tobacco Use   Smoking status: Never   Smokeless tobacco: Never  Vaping Use   Vaping Use: Never used  Substance and Sexual Activity   Alcohol use: Not Currently    Comment: Previously drinking 3-5 drinks nightly x 20 year   Drug use: No   Sexual activity: Not on file  Other Topics Concern   Not on file  Social History Narrative      Lives with wife      One story home      Highest level edu- masters      Right handed      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  Generalized: Well developed, in no acute distress  Neurological examination  Mentation: Alert oriented to time, place, history taking. Follows all commands speech and language fluent Cranial nerve II-XII: Pupils were equal round reactive to light. Extraocular movements were full, visual field were full on confrontational test. Facial sensation and strength were normal.  Head turning and shoulder shrug  were normal and symmetric. Motor: The motor testing reveals 5 over 5 strength of all 4 extremities. Good symmetric motor tone is noted throughout.  Sensory: Sensory testing is intact to soft touch on all 4 extremities. No evidence of extinction is noted.  Coordination: Cerebellar testing reveals good finger-nose-finger and heel-to-shin bilaterally.  Gait and station: Gait is normal.   DIAGNOSTIC DATA (LABS, IMAGING, TESTING) - I reviewed patient records, labs, notes, testing and imaging myself where available.  Lab Results  Component Value Date   WBC 7.7 11/08/2021   HGB 15.2 11/08/2021   HCT 45.7 11/08/2021   MCV 91.4 11/08/2021   PLT 254 11/08/2021      Component Value Date/Time   NA 140 11/08/2021 1358   K 4.7 11/08/2021 1358   CL 108 11/08/2021 1358   CO2 24 11/08/2021 1358   GLUCOSE 179 (H) 11/08/2021 1358   BUN 23 11/08/2021 1358   CREATININE 1.12 11/08/2021 1358  CALCIUM 9.5 11/08/2021 1358   PROT 6.6 11/08/2021 1358   ALBUMIN 3.7 11/08/2021 1358   AST 27 11/08/2021 1358   ALT 25 11/08/2021 1358    ALKPHOS 47 11/08/2021 1358   BILITOT 1.2 11/08/2021 1358   GFRNONAA >60 11/08/2021 1358   GFRAA >60 04/04/2015 2057   No results found for: "CHOL", "HDL", "LDLCALC", "LDLDIRECT", "TRIG", "CHOLHDL" Lab Results  Component Value Date   HGBA1C 7.4 (H) 06/25/2021   Lab Results  Component Value Date   VITAMINB12 283 02/21/2019   No results found for: "TSH"  Anthony Case, AGNP-C, Anthony Case 10/02/2022, 11:35 AM [facility] [address] [place], [place] [address] [phone]

## 2022-10-23 DIAGNOSIS — L4050 Arthropathic psoriasis, unspecified: Secondary | ICD-10-CM | POA: Diagnosis not present

## 2022-10-28 DIAGNOSIS — M810 Age-related osteoporosis without current pathological fracture: Secondary | ICD-10-CM | POA: Diagnosis not present

## 2022-10-28 DIAGNOSIS — Z125 Encounter for screening for malignant neoplasm of prostate: Secondary | ICD-10-CM | POA: Diagnosis not present

## 2022-10-28 DIAGNOSIS — E785 Hyperlipidemia, unspecified: Secondary | ICD-10-CM | POA: Diagnosis not present

## 2022-10-28 DIAGNOSIS — N529 Male erectile dysfunction, unspecified: Secondary | ICD-10-CM | POA: Diagnosis not present

## 2022-10-28 DIAGNOSIS — E1129 Type 2 diabetes mellitus with other diabetic kidney complication: Secondary | ICD-10-CM | POA: Diagnosis not present

## 2022-10-28 DIAGNOSIS — R7989 Other specified abnormal findings of blood chemistry: Secondary | ICD-10-CM | POA: Diagnosis not present

## 2022-10-28 DIAGNOSIS — I10 Essential (primary) hypertension: Secondary | ICD-10-CM | POA: Diagnosis not present

## 2022-11-03 DIAGNOSIS — Z1212 Encounter for screening for malignant neoplasm of rectum: Secondary | ICD-10-CM | POA: Diagnosis not present

## 2022-11-04 ENCOUNTER — Telehealth: Payer: Self-pay | Admitting: Neurology

## 2022-11-04 DIAGNOSIS — Z1331 Encounter for screening for depression: Secondary | ICD-10-CM | POA: Diagnosis not present

## 2022-11-04 DIAGNOSIS — M810 Age-related osteoporosis without current pathological fracture: Secondary | ICD-10-CM | POA: Diagnosis not present

## 2022-11-04 DIAGNOSIS — Z0000 Encounter for general adult medical examination without abnormal findings: Secondary | ICD-10-CM | POA: Diagnosis not present

## 2022-11-04 DIAGNOSIS — D6859 Other primary thrombophilia: Secondary | ICD-10-CM | POA: Diagnosis not present

## 2022-11-04 DIAGNOSIS — Z1389 Encounter for screening for other disorder: Secondary | ICD-10-CM | POA: Diagnosis not present

## 2022-11-04 DIAGNOSIS — R82998 Other abnormal findings in urine: Secondary | ICD-10-CM | POA: Diagnosis not present

## 2022-11-04 DIAGNOSIS — E785 Hyperlipidemia, unspecified: Secondary | ICD-10-CM | POA: Diagnosis not present

## 2022-11-04 DIAGNOSIS — I10 Essential (primary) hypertension: Secondary | ICD-10-CM | POA: Diagnosis not present

## 2022-11-04 DIAGNOSIS — E1140 Type 2 diabetes mellitus with diabetic neuropathy, unspecified: Secondary | ICD-10-CM | POA: Diagnosis not present

## 2022-11-04 DIAGNOSIS — I5022 Chronic systolic (congestive) heart failure: Secondary | ICD-10-CM | POA: Diagnosis not present

## 2022-11-04 DIAGNOSIS — R972 Elevated prostate specific antigen [PSA]: Secondary | ICD-10-CM | POA: Diagnosis not present

## 2022-11-04 DIAGNOSIS — R2681 Unsteadiness on feet: Secondary | ICD-10-CM | POA: Diagnosis not present

## 2022-11-04 DIAGNOSIS — Z794 Long term (current) use of insulin: Secondary | ICD-10-CM | POA: Diagnosis not present

## 2022-11-04 DIAGNOSIS — M459 Ankylosing spondylitis of unspecified sites in spine: Secondary | ICD-10-CM | POA: Diagnosis not present

## 2022-11-04 NOTE — Telephone Encounter (Signed)
Attempted to call pt, LVM asking pt to bring cpap CARD to our office

## 2022-11-04 NOTE — Telephone Encounter (Addendum)
Please call him and ask him to bring in his CPAP machine for a card download. I can review it and call him.  ----- Message from Suzzanne Cloud, NP sent at 10/02/2022 11:10 AM EST ----- Please bring machine in for card download, we switched to auto pap

## 2022-11-10 DIAGNOSIS — L400 Psoriasis vulgaris: Secondary | ICD-10-CM | POA: Diagnosis not present

## 2022-11-10 NOTE — Telephone Encounter (Signed)
Patient came to office with card and was able to get download and placed report in [place] office for review.

## 2022-11-11 NOTE — Telephone Encounter (Signed)
Pt notified want make [facility] aware [facility] setup Airsense 10 so can be remotely monitored. If have any problems let me know and I will call [facility].

## 2022-11-12 ENCOUNTER — Telehealth: Payer: Self-pay | Admitting: Neurology

## 2022-11-12 NOTE — Telephone Encounter (Signed)
Called [facility] and confirmed order were received and machine was adjusted accordingly

## 2022-11-12 NOTE — Telephone Encounter (Signed)
Called and spoke to [place] at [place]. He states it may be a mistake on their part and they will reach out to patient and follow up with [place] once setting is corrected. Please see if it is corrected when he calls back, thanks

## 2022-11-12 NOTE — Telephone Encounter (Signed)
I reviewed his download 10/11/22-11/09/2022 usage 30/30 days, greater than 4 hours 97%.  Average usage 7 hours 14 minutes.  Set pressure 15 cm water.  Leak 18.9, AHI 7.0.  It looks his machine is still on set pressure, after last office visit 10/02/2022 I sent an order to the DME to switch to AutoPap 6-16 cmH2O, EPR 2).  Can we call the DME and check the settings.  If the above changes were not made can they be made?  Thanks  1.  Obstructive sleep apnea, moderate to severe   -Switch to auto pap, 6-16 EPR 2, continue nasal mask with mouth tape (current pressure 15, EPR 2, AHI 15.3) -He is doing supine sleep, which impacts his AHI likely, try his best to avoid -His machine is a card download, I have sent myself a reminder to contact him in about 4 weeks to bring the machine in, we can pull download, he may require a higher end range for set pressure  -He will be due for a new CPAP machine in September of this year, will arrange for 68-month revisit with Dr. Brett Fairy -He is very diligent and motivated to continue his CPAP use

## 2022-11-20 DIAGNOSIS — R260 Ataxic gait: Secondary | ICD-10-CM | POA: Diagnosis not present

## 2022-11-20 DIAGNOSIS — L4050 Arthropathic psoriasis, unspecified: Secondary | ICD-10-CM | POA: Diagnosis not present

## 2022-11-25 ENCOUNTER — Other Ambulatory Visit: Payer: Self-pay | Admitting: Urology

## 2022-11-25 DIAGNOSIS — R972 Elevated prostate specific antigen [PSA]: Secondary | ICD-10-CM

## 2022-11-25 DIAGNOSIS — R260 Ataxic gait: Secondary | ICD-10-CM | POA: Diagnosis not present

## 2022-11-27 DIAGNOSIS — E113391 Type 2 diabetes mellitus with moderate nonproliferative diabetic retinopathy without macular edema, right eye: Secondary | ICD-10-CM | POA: Diagnosis not present

## 2022-11-27 DIAGNOSIS — R260 Ataxic gait: Secondary | ICD-10-CM | POA: Diagnosis not present

## 2022-12-02 DIAGNOSIS — M1991 Primary osteoarthritis, unspecified site: Secondary | ICD-10-CM | POA: Diagnosis not present

## 2022-12-02 DIAGNOSIS — R260 Ataxic gait: Secondary | ICD-10-CM | POA: Diagnosis not present

## 2022-12-02 DIAGNOSIS — Z79899 Other long term (current) drug therapy: Secondary | ICD-10-CM | POA: Diagnosis not present

## 2022-12-02 DIAGNOSIS — L4050 Arthropathic psoriasis, unspecified: Secondary | ICD-10-CM | POA: Diagnosis not present

## 2022-12-02 DIAGNOSIS — I5020 Unspecified systolic (congestive) heart failure: Secondary | ICD-10-CM | POA: Diagnosis not present

## 2022-12-02 DIAGNOSIS — S22080A Wedge compression fracture of T11-T12 vertebra, initial encounter for closed fracture: Secondary | ICD-10-CM | POA: Diagnosis not present

## 2022-12-02 DIAGNOSIS — R7989 Other specified abnormal findings of blood chemistry: Secondary | ICD-10-CM | POA: Diagnosis not present

## 2022-12-02 DIAGNOSIS — Z6834 Body mass index (BMI) 34.0-34.9, adult: Secondary | ICD-10-CM | POA: Diagnosis not present

## 2022-12-02 DIAGNOSIS — E669 Obesity, unspecified: Secondary | ICD-10-CM | POA: Diagnosis not present

## 2022-12-02 DIAGNOSIS — L409 Psoriasis, unspecified: Secondary | ICD-10-CM | POA: Diagnosis not present

## 2022-12-25 ENCOUNTER — Ambulatory Visit: Payer: Medicare Other | Admitting: Podiatry

## 2022-12-25 ENCOUNTER — Encounter: Payer: Self-pay | Admitting: Podiatry

## 2022-12-25 ENCOUNTER — Ambulatory Visit (INDEPENDENT_AMBULATORY_CARE_PROVIDER_SITE_OTHER): Payer: Medicare Other | Admitting: Podiatry

## 2022-12-25 DIAGNOSIS — E1142 Type 2 diabetes mellitus with diabetic polyneuropathy: Secondary | ICD-10-CM

## 2022-12-25 DIAGNOSIS — B351 Tinea unguium: Secondary | ICD-10-CM

## 2022-12-25 DIAGNOSIS — R260 Ataxic gait: Secondary | ICD-10-CM | POA: Diagnosis not present

## 2022-12-25 DIAGNOSIS — M79675 Pain in left toe(s): Secondary | ICD-10-CM | POA: Diagnosis not present

## 2022-12-25 NOTE — Progress Notes (Signed)
This patient returns to my office for at risk foot care.  This patient requires this care by a professional since this patient will be at risk due to having diabetic neuropathy and history of DVT.   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 This patient is unable to cut nails himself since the patient cannot reach his nails.These nails are painful walking and wearing shoes. This patient presents for at risk foot care today.  General Appearance  Alert, conversant and in no acute stress.  Vascular  Dorsalis pedis and posterior tibial  pulses are palpable  bilaterally.  Capillary return is within normal limits  bilaterally. Temperature is within normal limits  bilaterally.  Neurologic  Senn-Weinstein monofilament wire test within normal limits  Left foot.  LOPS absent rght foot.. Muscle power within normal limits bilaterally.  Nails Thick disfigured discolored nails with subungual debris hallux toenails. No evidence of bacterial infection or drainage bilaterally.  Orthopedic  No limitations of motion  feet .  No crepitus or effusions noted.  No bony pathology or digital deformities noted.  HAV  B/L.  DJD right midfoot.  Skin  normotropic skin with no porokeratosis noted bilaterally.  No signs of infections .  Healing ulcer noted left hallux.  No drainage.  Presently under treatment at wound center.    Onychomycosis  Pain in right toes  Pain in left toes  Consent was obtained for treatment procedures.   Mechanical debridement of nails  hallux  left. performed with a nail nipper.  Filed with dremel without incident.    Return office visit    4 months .                 Told patient to return for periodic foot care and evaluation due to potential at risk complications.   Helane Gunther DPM

## 2022-12-30 DIAGNOSIS — R260 Ataxic gait: Secondary | ICD-10-CM | POA: Diagnosis not present

## 2022-12-30 DIAGNOSIS — L4050 Arthropathic psoriasis, unspecified: Secondary | ICD-10-CM | POA: Diagnosis not present

## 2023-01-01 DIAGNOSIS — N5201 Erectile dysfunction due to arterial insufficiency: Secondary | ICD-10-CM | POA: Diagnosis not present

## 2023-01-01 DIAGNOSIS — N400 Enlarged prostate without lower urinary tract symptoms: Secondary | ICD-10-CM | POA: Diagnosis not present

## 2023-01-01 DIAGNOSIS — R972 Elevated prostate specific antigen [PSA]: Secondary | ICD-10-CM | POA: Diagnosis not present

## 2023-01-01 DIAGNOSIS — R260 Ataxic gait: Secondary | ICD-10-CM | POA: Diagnosis not present

## 2023-01-02 DIAGNOSIS — H43823 Vitreomacular adhesion, bilateral: Secondary | ICD-10-CM | POA: Diagnosis not present

## 2023-01-02 DIAGNOSIS — H2513 Age-related nuclear cataract, bilateral: Secondary | ICD-10-CM | POA: Diagnosis not present

## 2023-01-02 DIAGNOSIS — E113393 Type 2 diabetes mellitus with moderate nonproliferative diabetic retinopathy without macular edema, bilateral: Secondary | ICD-10-CM | POA: Diagnosis not present

## 2023-01-05 ENCOUNTER — Ambulatory Visit
Admission: RE | Admit: 2023-01-05 | Discharge: 2023-01-05 | Disposition: A | Discharge status: Home / Self Care | Payer: Medicare Other | Source: Ambulatory Visit | Attending: Urology | Admitting: Urology

## 2023-01-05 DIAGNOSIS — N400 Enlarged prostate without lower urinary tract symptoms: Secondary | ICD-10-CM | POA: Diagnosis not present

## 2023-01-05 DIAGNOSIS — R972 Elevated prostate specific antigen [PSA]: Secondary | ICD-10-CM

## 2023-01-05 MED ORDER — GADOPICLENOL 0.5 MMOL/ML IV SOLN
10.0000 mL | Freq: Once | INTRAVENOUS | Status: AC | PRN
  Administered 2023-01-05: 10 mL via INTRAVENOUS

## 2023-01-06 DIAGNOSIS — R260 Ataxic gait: Secondary | ICD-10-CM | POA: Diagnosis not present

## 2023-01-08 DIAGNOSIS — R260 Ataxic gait: Secondary | ICD-10-CM | POA: Diagnosis not present

## 2023-01-13 DIAGNOSIS — R260 Ataxic gait: Secondary | ICD-10-CM | POA: Diagnosis not present

## 2023-01-15 DIAGNOSIS — R260 Ataxic gait: Secondary | ICD-10-CM | POA: Diagnosis not present

## 2023-01-20 DIAGNOSIS — R260 Ataxic gait: Secondary | ICD-10-CM | POA: Diagnosis not present

## 2023-01-22 DIAGNOSIS — R260 Ataxic gait: Secondary | ICD-10-CM | POA: Diagnosis not present

## 2023-01-27 DIAGNOSIS — R260 Ataxic gait: Secondary | ICD-10-CM | POA: Diagnosis not present

## 2023-01-27 DIAGNOSIS — L4050 Arthropathic psoriasis, unspecified: Secondary | ICD-10-CM | POA: Diagnosis not present

## 2023-01-29 DIAGNOSIS — R260 Ataxic gait: Secondary | ICD-10-CM | POA: Diagnosis not present

## 2023-02-03 DIAGNOSIS — R260 Ataxic gait: Secondary | ICD-10-CM | POA: Diagnosis not present

## 2023-02-03 DIAGNOSIS — E1129 Type 2 diabetes mellitus with other diabetic kidney complication: Secondary | ICD-10-CM | POA: Diagnosis not present

## 2023-02-03 DIAGNOSIS — L089 Local infection of the skin and subcutaneous tissue, unspecified: Secondary | ICD-10-CM | POA: Diagnosis not present

## 2023-02-03 DIAGNOSIS — L4050 Arthropathic psoriasis, unspecified: Secondary | ICD-10-CM | POA: Diagnosis not present

## 2023-02-10 DIAGNOSIS — R260 Ataxic gait: Secondary | ICD-10-CM | POA: Diagnosis not present

## 2023-02-12 DIAGNOSIS — R260 Ataxic gait: Secondary | ICD-10-CM | POA: Diagnosis not present

## 2023-02-17 DIAGNOSIS — R260 Ataxic gait: Secondary | ICD-10-CM | POA: Diagnosis not present

## 2023-02-19 DIAGNOSIS — R260 Ataxic gait: Secondary | ICD-10-CM | POA: Diagnosis not present

## 2023-02-24 DIAGNOSIS — L4050 Arthropathic psoriasis, unspecified: Secondary | ICD-10-CM | POA: Diagnosis not present

## 2023-02-24 DIAGNOSIS — R260 Ataxic gait: Secondary | ICD-10-CM | POA: Diagnosis not present

## 2023-03-03 DIAGNOSIS — R260 Ataxic gait: Secondary | ICD-10-CM | POA: Diagnosis not present

## 2023-03-04 DIAGNOSIS — Z23 Encounter for immunization: Secondary | ICD-10-CM | POA: Diagnosis not present

## 2023-03-04 DIAGNOSIS — S22080A Wedge compression fracture of T11-T12 vertebra, initial encounter for closed fracture: Secondary | ICD-10-CM | POA: Diagnosis not present

## 2023-03-04 DIAGNOSIS — L409 Psoriasis, unspecified: Secondary | ICD-10-CM | POA: Diagnosis not present

## 2023-03-04 DIAGNOSIS — Z79899 Other long term (current) drug therapy: Secondary | ICD-10-CM | POA: Diagnosis not present

## 2023-03-04 DIAGNOSIS — E669 Obesity, unspecified: Secondary | ICD-10-CM | POA: Diagnosis not present

## 2023-03-04 DIAGNOSIS — Z6838 Body mass index (BMI) 38.0-38.9, adult: Secondary | ICD-10-CM | POA: Diagnosis not present

## 2023-03-04 DIAGNOSIS — I5020 Unspecified systolic (congestive) heart failure: Secondary | ICD-10-CM | POA: Diagnosis not present

## 2023-03-04 DIAGNOSIS — R7989 Other specified abnormal findings of blood chemistry: Secondary | ICD-10-CM | POA: Diagnosis not present

## 2023-03-04 DIAGNOSIS — L4050 Arthropathic psoriasis, unspecified: Secondary | ICD-10-CM | POA: Diagnosis not present

## 2023-03-04 DIAGNOSIS — M1991 Primary osteoarthritis, unspecified site: Secondary | ICD-10-CM | POA: Diagnosis not present

## 2023-03-10 DIAGNOSIS — R260 Ataxic gait: Secondary | ICD-10-CM | POA: Diagnosis not present

## 2023-03-12 DIAGNOSIS — R260 Ataxic gait: Secondary | ICD-10-CM | POA: Diagnosis not present

## 2023-05-25 IMAGING — CT CT T SPINE W/O CM
2 series · 15 of 20 positions shown, 18 images · non-contrast
Comparison: CT of the thoracic spine 04/25/2021 and 05/23/2013.

CLINICAL DATA: Fall.  T9 fracture.  Previous fusion.

EXAM:
CT THORACIC SPINE WITHOUT CONTRAST
TECHNIQUE: Multidetector CT images of the thoracic were obtained using the
standard protocol without intravenous contrast.

[Series 3: t spine soft (person_name) · axial · 0.51mm/px · z∈[-732,-402]mm · 12 of 131 slices shown, 15 images]
[im 11/131  soft-tissue]
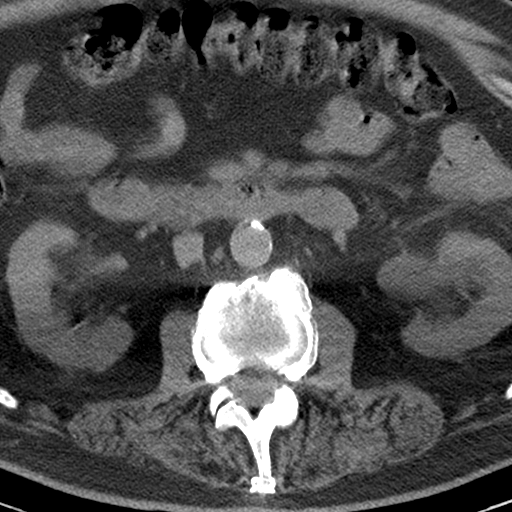
[im 11/131  bone]
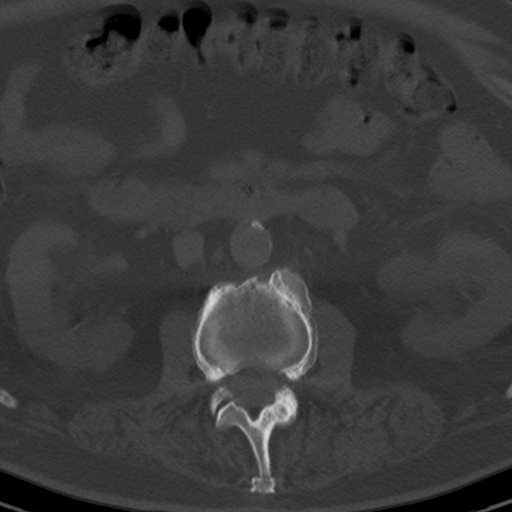
[im 21/131  bone]
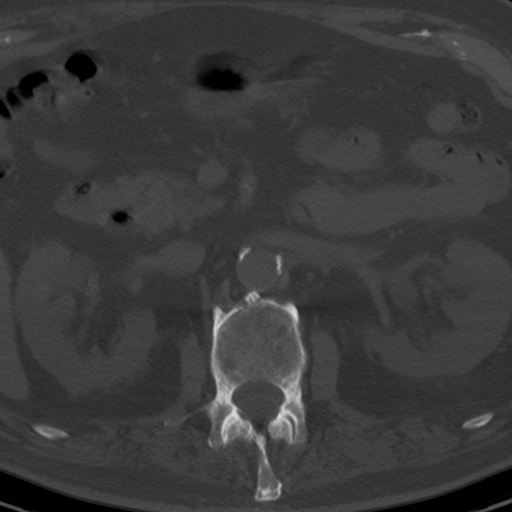
[im 31/131  bone]
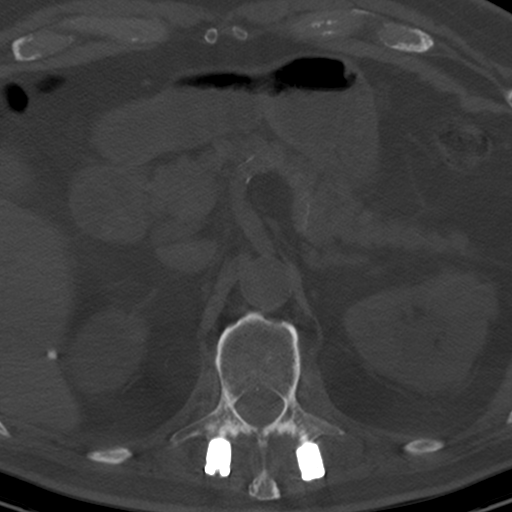
[im 41/131  bone]
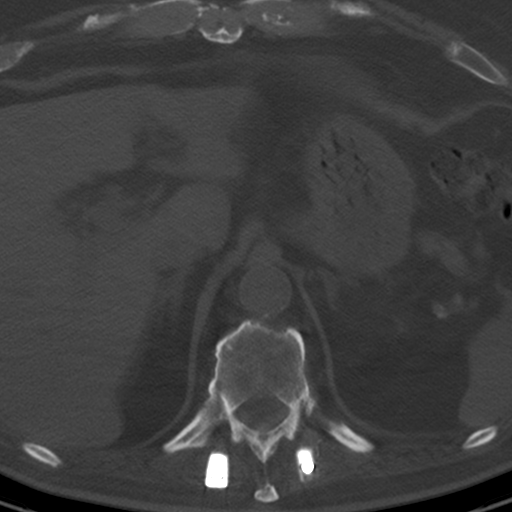
[im 51/131  soft-tissue]
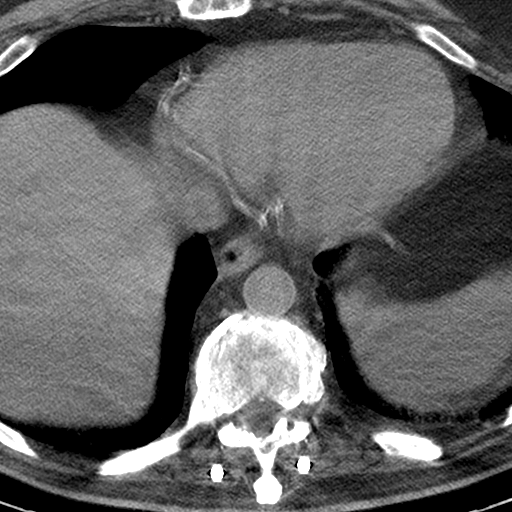
[im 51/131  bone]
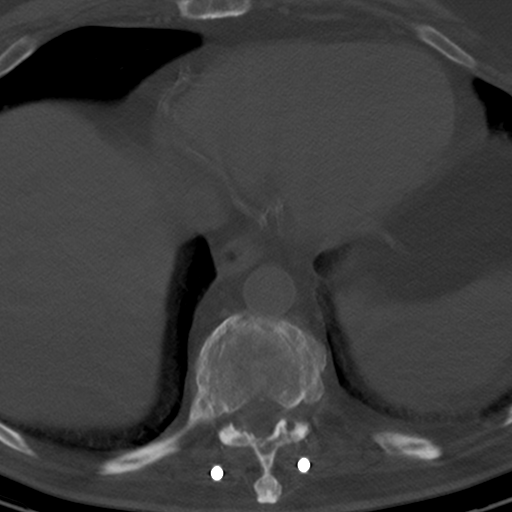
[im 61/131  bone]
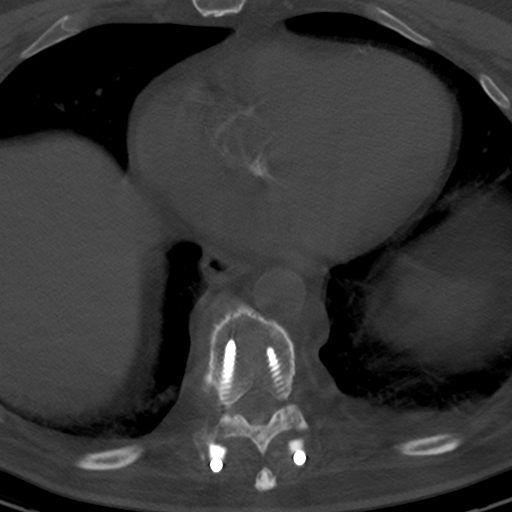
[im 71/131  bone]
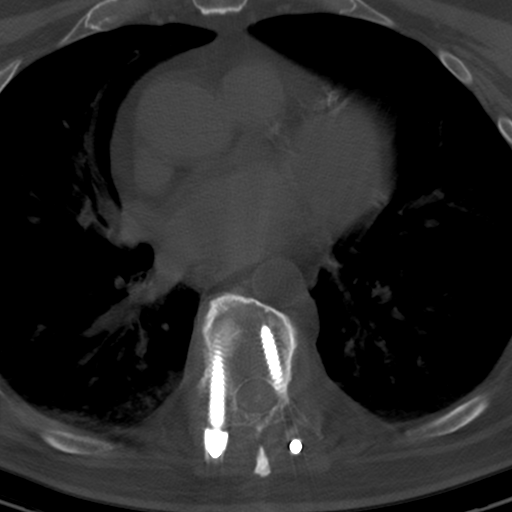
[im 81/131  bone]
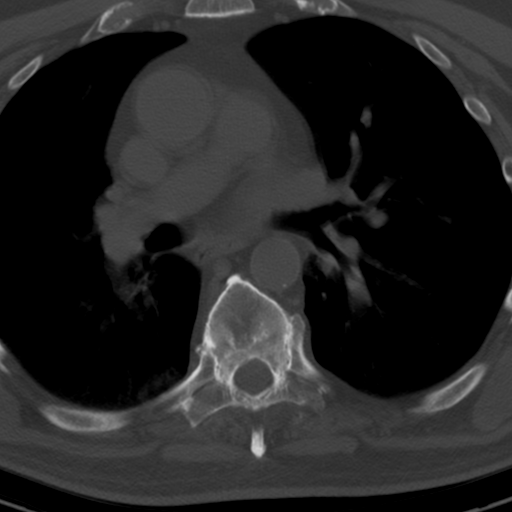
[im 91/131  soft-tissue]
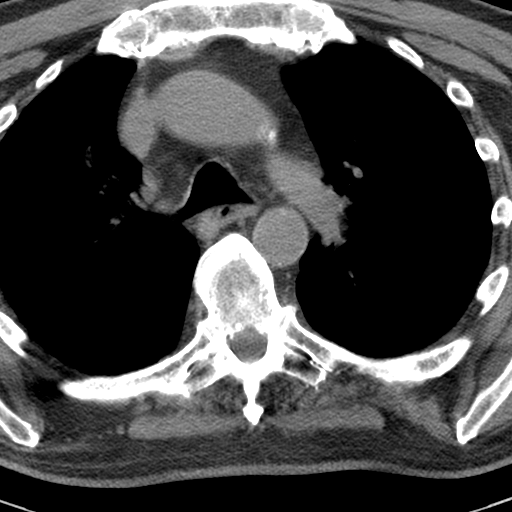
[im 91/131  bone]
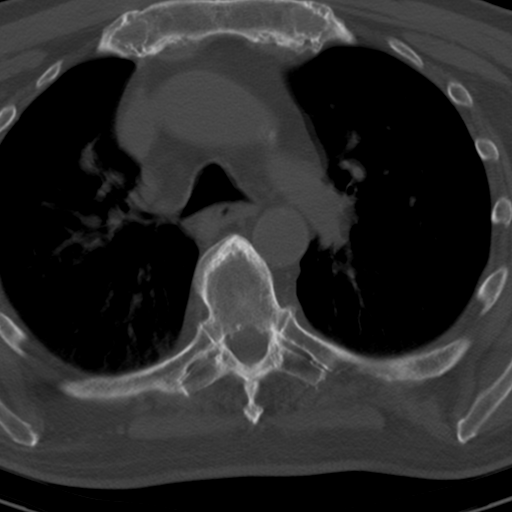
[im 101/131  bone]
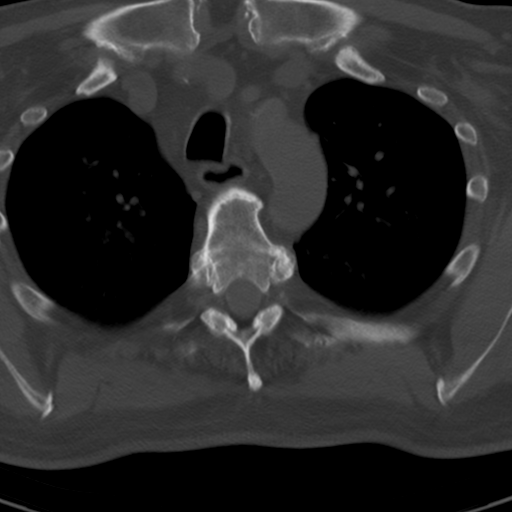
[im 111/131  bone]
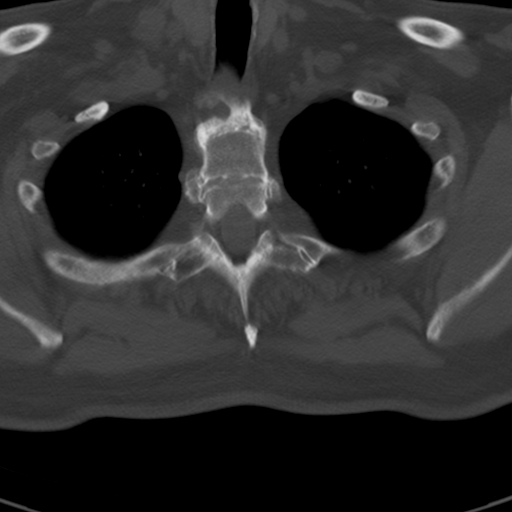
[im 121/131  bone]
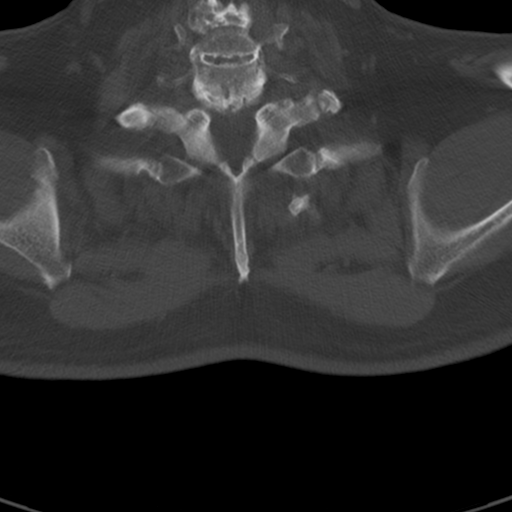

[Series 602: cor lower · coronal · 0.57mm/px · 3 of 47 slices shown]
[im 10/47  bone]
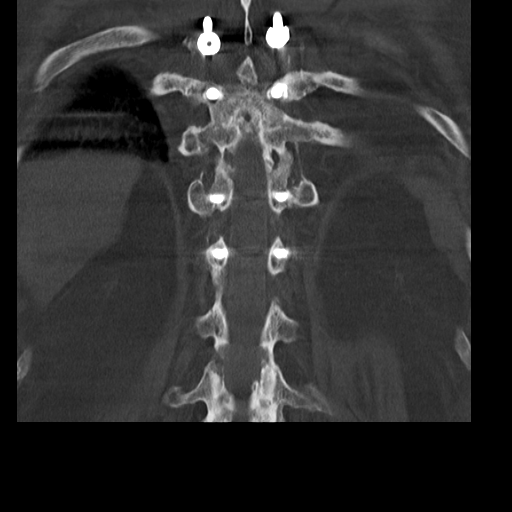
[im 19/47  bone]
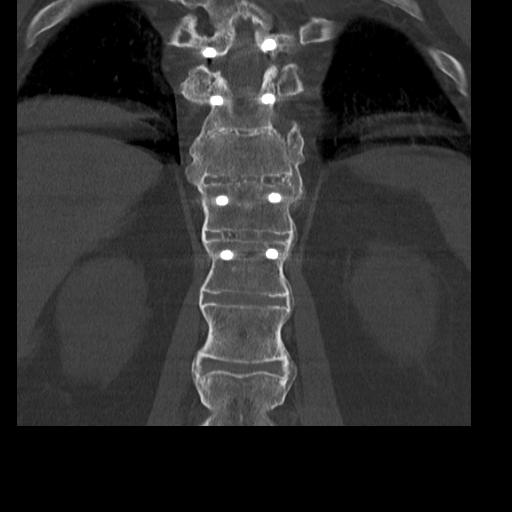
[im 28/47  bone]
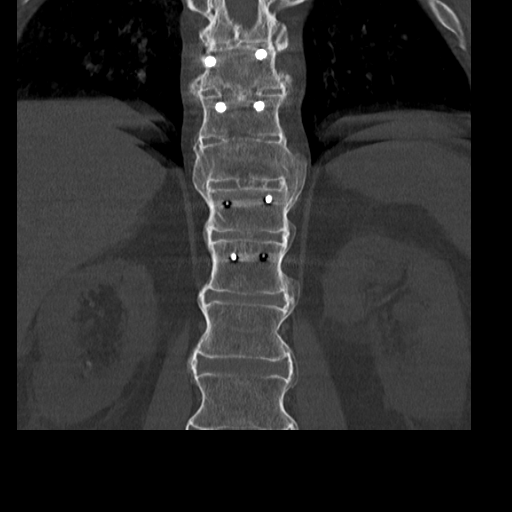

[15 of 20 positions shown; findings below may reference images not displayed]

FINDINGS: Alignment: No significant listhesis is present. Exaggerated thoracic
kyphosis stable.

Vertebrae: The inferior endplate T9 fracture demonstrates interval
healing. No new fractures are present. Ankylosis across the disc
space is present throughout the lower cervical, thoracic, and upper
lumbar spine.

Paraspinal and other soft tissues: Paraspinous soft tissues are
within normal limits. Atherosclerotic changes are noted at the
aortic arch. Coronary artery calcifications are present. Lungs are
clear. No significant pleural or pericardial effusion is present.
Left renal stones are present. New left-sided hydronephrosis is
present. This suggest distal obstruction.

Disc levels: No significant focal stenosis or change.
IMPRESSION: 1. Interval healing of inferior endplate T9 fracture.
2. No new fractures.
3. Ankylosing spondylosis with otherwise diffuse anterior and
posterior fusion.
4. New left-sided hydronephrosis. This suggest distal obstruction.
5. Left renal stones.
6. Aortic Atherosclerosis ([id number]).

These results will be called to the ordering clinician or
representative by the Radiologist Assistant, and communication
documented in the PACS or [REDACTED].

## 2023-05-26 DIAGNOSIS — L4050 Arthropathic psoriasis, unspecified: Secondary | ICD-10-CM | POA: Diagnosis not present

## 2023-05-28 ENCOUNTER — Ambulatory Visit (INDEPENDENT_AMBULATORY_CARE_PROVIDER_SITE_OTHER): Payer: Medicare Other | Admitting: Neurology

## 2023-05-28 ENCOUNTER — Encounter: Payer: Self-pay | Admitting: Neurology

## 2023-05-28 VITALS — BP 125/62 | HR 67 | Ht 71.0 in | Wt 254.6 lb

## 2023-05-28 DIAGNOSIS — I420 Dilated cardiomyopathy: Secondary | ICD-10-CM | POA: Diagnosis not present

## 2023-05-28 DIAGNOSIS — I2782 Chronic pulmonary embolism: Secondary | ICD-10-CM

## 2023-05-28 DIAGNOSIS — I2602 Saddle embolus of pulmonary artery with acute cor pulmonale: Secondary | ICD-10-CM | POA: Diagnosis not present

## 2023-05-28 DIAGNOSIS — E785 Hyperlipidemia, unspecified: Secondary | ICD-10-CM | POA: Diagnosis not present

## 2023-05-28 DIAGNOSIS — I700 Atherosclerosis of aorta: Secondary | ICD-10-CM | POA: Diagnosis not present

## 2023-05-28 DIAGNOSIS — G4733 Obstructive sleep apnea (adult) (pediatric): Secondary | ICD-10-CM

## 2023-05-28 DIAGNOSIS — R0609 Other forms of dyspnea: Secondary | ICD-10-CM | POA: Diagnosis not present

## 2023-05-28 DIAGNOSIS — Z794 Long term (current) use of insulin: Secondary | ICD-10-CM | POA: Diagnosis not present

## 2023-05-28 DIAGNOSIS — I428 Other cardiomyopathies: Secondary | ICD-10-CM | POA: Diagnosis not present

## 2023-05-28 DIAGNOSIS — D6859 Other primary thrombophilia: Secondary | ICD-10-CM | POA: Diagnosis not present

## 2023-05-28 DIAGNOSIS — Z9989 Dependence on other enabling machines and devices: Secondary | ICD-10-CM

## 2023-05-28 DIAGNOSIS — I110 Hypertensive heart disease with heart failure: Secondary | ICD-10-CM | POA: Diagnosis not present

## 2023-05-28 DIAGNOSIS — I82402 Acute embolism and thrombosis of unspecified deep veins of left lower extremity: Secondary | ICD-10-CM | POA: Diagnosis not present

## 2023-05-28 DIAGNOSIS — E1151 Type 2 diabetes mellitus with diabetic peripheral angiopathy without gangrene: Secondary | ICD-10-CM | POA: Diagnosis not present

## 2023-05-28 DIAGNOSIS — I5022 Chronic systolic (congestive) heart failure: Secondary | ICD-10-CM | POA: Diagnosis not present

## 2023-05-28 NOTE — Patient Instructions (Signed)
72 - year -old male  here with: CPAP dependence , OSA  at baseline- has CHF and EF 40-45% Dr Jacinto Halim.  Has an autoimmune condition, psoriasis .    The patient underwent a split-night protocol sleep study which confirmed severe apnea he did have some periodic limb movements but he did not wake up from those.  His oxygen nadir was 75% and he spent 16.2 minutes and oxygen hypoxemia.  Heart rate varied greatly between 71 bpm which is bradycardia and 68 bpm he was mainly bradycardic throughout the night.  The by CMS criteria his AHI was 35/h and slightly accentuated and REM sleep.   A new machine is needed, this one doesn't display any data anymore.        1) I could confirm that Anthony Case has been using his CPAP highly compliant with 90 out of 90 days 88 days over 4 hours consecutively with an average of 7 hours 21 minutes.  His CPAP is set at 15 cm water was 2 cm EPR and his residual AHI is 6.1.  Some residual apneas are obstructive so I think he would benefit from a new mask fit to reduce air leakage and also from a slight increase in pressure.  I will order a home sleep test for which the patient will have to sleep 1 night without CPAP.  I will offer him a sleep aid to make sure that we get 4 hours of valid test time.  Based on that I will order an outer titration device which should probably encompass between 7 and 17 cm water pressure and I will pay special attention to any residual hypoxia being seen on this home sleep test.   2)  HST ordered,    3) he uses a F 30 I by [employer] ,  medium size with tape over mustache .   I plan to follow up either personally or through our NP within 3 -5  months.

## 2023-05-28 NOTE — Progress Notes (Signed)
Provider:  Melvyn Novas, MD  Primary Care Physician:  Garlan Fillers, MD [address] [place] [address]      Referring Provider: Yates Decamp, Md [address] [place],  [place] [address]    Primary Neurologist : Nita Sickle, DO        Chief Complaint according to patient   Patient presents with:                HISTORY OF PRESENT ILLNESS:  Anthony Case is a 72 y.o. male patient who is here for revisit 05/28/2023 for  CPAP compliance and new machine .  Chief information according to patient :  I just got back form a 10 weeks trip in a [place] , [place].  [place]. I have kept my weight off, I am using my CPAP but it is now in sad shape ". I could not tolerate my previous  psoriasis medication, REMICAID and interacted with Entresto-  I needed to change. Now on Cosentyx IV. Dr Dierdre Forth." He states his machine is from 2018. New one needed.      Anthony Case is a 72 y.o. year old  male patient who is still working full term ,seen here as a referral on 07/10/2022  in a RV on his newer CPAP, with a nasal bridge blister. He recently has seen Dr. 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  He has a history of diabetes mellitus type 2, obstructive sleep apnea CPAP machine was ordered and there was a delay before he got the new mask- he has been using it since September 2023.  He presents today with blisters on his nasal bridge.  His CPAP machine use was 100% set at 10 cm water pressure was 2 cm EPR and with a residual AHI of 19.1 these are obstructive apneas so the current pressure is not high enough.  I think we need to open the outer titration since again and started from 7 to 14 cm water pressure in order to reduce the obstructive  residual. He will receive also a new mask that does spare the nasal bridge I will order an F30 I fullface mask with nasal cradle.     Sleep study 05/20/2022: (Moderate to severe )OSA, frequent periodic limb movements, bradycardia during the study.  Patient treated with CPAP. He is compliant with CPAP use:     Anthony Case is a 72 y.o. year old  male patient who is still working full term ,seen here as a referral on 04/23/2022 from  for a Sleep evaluation.  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 It is recovered on ENTRESTO to over 40%. Improved SOB.   Anthony Case  has a past medical history of Psoriatic Arthritis, recovering alcoholic, Diabetes mellitus type 2 , on insulin-dx 1998, pulmonary embolism (08/26/2007), Hypertension, Shortness of breath, and Obstructive Sleep apnea on CPAP for decades. He has diabetic neuropathy and skin atrophy, hairless legs, easily bruising.    The patient had  the first sleep study in the year 1994 at [facility] and the last one in 08-16-2013 at [facility]:   with a result of an AHI ( Apnea Hypopnea index)  of 43.9/h., resulted by Dr. Marcelyn Bruins.    The patient has been 100% compliant user of his CPAP which was actually set up September 2019 so it is 72 years old at this time and he would have 1 more year to go before we can replace it.  It was replaced without a new sleep test.  His medical conditions however have changed dramatically his CPAP is set at 10 cm of water with 2 cm expiratory pressure relief and is used on average for 7 hours and 55 minutes.  His residual AHI is very high so it is not serving him well the AHI is 19.2.  There are 11.3 obstructive apneas per hour that are not treated with the current setting and his central apnea count is 1.6.  He does have  high air leakage which can be improved I think with the visit chinstrap further.  He does not need new supplies. [employer]   Review of Systems: Out of a complete 14 system review, the patient complains of only the following symptoms, and all other reviewed systems are negative.:    How likely are you to doze in the following situations: 0 = not likely, 1 = slight chance, 2 = moderate chance, 3 = high chance   Sitting and Reading? Watching Television? Sitting inactive in a public place (theater or meeting)? As a passenger in a car for an hour without a break? Lying down in the afternoon when circumstances permit? Sitting and talking to someone? Sitting quietly after lunch without alcohol? In a car, while stopped for a few minutes in traffic?   Total = 10/ 24 points   FSS endorsed at 33/ 63 points.  GDS at 1/ 15 points.   Social History   Socioeconomic History   Marital status: Married    Spouse name: Not on file   Number of children: 3   Years of education: 18   Highest education level: Not on file  Occupational History   Occupation: Programmer, applications: [employer]  Tobacco Use   Smoking status: Never   Smokeless tobacco: Never  Vaping Use   Vaping status: Never Used  Substance and Sexual Activity   Alcohol use: Not Currently    Comment: Previously drinking 3-5 drinks nightly x 20 year   Drug use: No   Sexual activity: Not on file  Other Topics Concern   Not on file  Social History Narrative      Lives with wife      One story home      Highest level edu- masters      Right handed      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   Social Determinants of Corporate investment banker Strain: Not on file  Food Insecurity: Not on file  Transportation Needs: Not on file  Physical Activity: Not on file  Stress: Not on file  Social Connections: Not on file    Family History  Problem Relation Age of Onset   Heart disease Mother     Schizophrenia Mother    Heart disease Father    Diabetes Father    Heart disease Brother     Past Medical History:  Diagnosis Date   Arthritis  Diabetes mellitus    Hx pulmonary embolism 08/26/2007   required emergent tPA treatment   Hypertension    Shortness of breath    Sleep apnea     Past Surgical History:  Procedure Laterality Date   BACK SURGERY     COLONOSCOPY  2009   adenoma polyp   CYSTOSCOPY/URETEROSCOPY/HOLMIUM LASER/STENT PLACEMENT Left 07/02/2021   Procedure: CYSTOSCOPY/RETROGRADE/URETEROSCOPY/HOLMIUM LASER/STENT PLACEMENT;  Surgeon: Jerilee Field, MD;  Location: [facility];  Service: Urology;  Laterality: Left;   ELBOW ARTHROSCOPY  2010   IR RADIOLOGIST EVAL & MGMT  12/25/2020   IR RADIOLOGIST EVAL & MGMT  12/28/2020   LUMBAR PERCUTANEOUS PEDICLE SCREW 3 LEVEL N/A 05/25/2013   Procedure: Posterior Percutaneous Fixation from Thoracic nine to Lumbar one vertebrae with arthrodesis of Thoracic eleven Fracture;  Surgeon: Barnett Abu, MD;  Location: [facility]UR[facility];  Service: Neurosurgery;  Laterality: N/A;  Posterior Percutaneous Fixation from Thoracic nine to Lumbar one vertebrae with arthrodesis of Thoracic eleven Fracture   RIGHT/LEFT HEART CATH AND CORONARY ANGIOGRAPHY N/A 08/27/2021   Procedure: RIGHT/LEFT HEART CATH AND CORONARY ANGIOGRAPHY;  Surgeon: Yates Decamp, MD;  Location: [facility];  Service: Cardiovascular;  Laterality: N/A;     Current Outpatient Medications on File Prior to Visit  Medication Sig Dispense Refill   acetaminophen (TYLENOL) 500 MG tablet Take 1,000 mg by mouth every 6 (six) hours as needed for moderate pain.     Ascorbic Acid (VITAMIN C) 1000 MG tablet Take 1,000 mg by mouth daily.     atorvastatin (LIPITOR) 20 MG tablet Take 20 mg by mouth daily.     augmented betamethasone dipropionate (DIPROLENE-AF) 0.05 % cream Apply 1 Application topically in the morning and at bedtime.     cholecalciferol (VITAMIN D3) 25 MCG (1000 UNIT) tablet Take  1,000 Units by mouth daily.     dapagliflozin propanediol (FARXIGA) 10 MG TABS tablet Take 10 mg by mouth daily.     ezetimibe (ZETIA) 10 MG tablet Take 1 tablet (10 mg total) by mouth daily. 90 tablet 3   glipiZIDE (GLUCOTROL XL) 2.5 MG 24 hr tablet Take 5 mg by mouth daily with breakfast.     Insulin Degludec (TRESIBA FLEXTOUCH Saunemin) Inject 60 Units into the skin daily at 2 PM.     metFORMIN (GLUCOPHAGE) 500 MG tablet Take 500 mg by mouth 2 (two) times daily with a meal.     methotrexate (RHEUMATREX) 2.5 MG tablet Take 15 mg by mouth once a week.     metoprolol succinate (TOPROL-XL) 50 MG 24 hr tablet Take 1 tablet (50 mg total) by mouth daily. 90 tablet 1   Multiple Vitamin (MULTIVITAMIN WITH MINERALS) TABS tablet Take 1 tablet by mouth daily.     Omega-3 Fatty Acids (FISH OIL) 1000 MG CAPS Take 1,000 mg by mouth daily.     rivaroxaban (XARELTO) 20 MG TABS tablet Take 1 tablet (20 mg total) by mouth daily. 08/04/13 last dose of xarelto in preparation for colonoscopy 30 tablet    sacubitril-valsartan (ENTRESTO) 97-103 MG Take 1 tablet by mouth 2 (two) times daily. 180 tablet 3   Secukinumab (COSENTYX IV) Inject into the vein.     Vitamin D, Ergocalciferol, 50000 units CAPS Take 1 capsule by mouth once a week Oral     HYDROcodone-acetaminophen (NORCO/VICODIN) 5-325 MG tablet Take 1 tablet by mouth every 6 (six) hours as needed for moderate pain. (Patient not taking: Reported on 05/28/2023)     No current facility-administered medications on  file prior to visit.    Allergies  Allergen Reactions   Ace Inhibitors Other (See Comments)   Lisinopril Cough     DIAGNOSTIC DATA (LABS, IMAGING, TESTING) - I reviewed patient records, labs, notes, testing and imaging myself where available.  Lab Results  Component Value Date   WBC 7.7 11/08/2021   HGB 15.2 11/08/2021   HCT 45.7 11/08/2021   MCV 91.4 11/08/2021   PLT 254 11/08/2021      Component Value Date/Time   NA 140 11/08/2021 1358   K  4.7 11/08/2021 1358   CL 108 11/08/2021 1358   CO2 24 11/08/2021 1358   GLUCOSE 179 (H) 11/08/2021 1358   BUN 23 11/08/2021 1358   CREATININE 1.12 11/08/2021 1358   CALCIUM 9.5 11/08/2021 1358   PROT 6.6 11/08/2021 1358   ALBUMIN 3.7 11/08/2021 1358   AST 27 11/08/2021 1358   ALT 25 11/08/2021 1358   ALKPHOS 47 11/08/2021 1358   BILITOT 1.2 11/08/2021 1358   GFRNONAA >60 11/08/2021 1358   GFRAA >60 04/04/2015 2057   No results found for: "CHOL", "HDL", "LDLCALC", "LDLDIRECT", "TRIG", "CHOLHDL" Lab Results  Component Value Date   HGBA1C 7.4 (H) 06/25/2021   Lab Results  Component Value Date   VITAMINB12 283 02/21/2019   No results found for: "TSH"  PHYSICAL EXAM:  Today's Vitals   05/28/23 1029  BP: 125/62  Pulse: 67  Weight: 254 lb 9.6 oz (115.5 kg)  Height: 5\' 11"  (1.803 m)   Body mass index is 35.51 kg/m.   Wt Readings from Last 3 Encounters:  05/28/23 254 lb 9.6 oz (115.5 kg)  10/02/22 247 lb (112 kg)  09/04/22 257 lb 3.2 oz (116.7 kg)     Ht Readings from Last 3 Encounters:  05/28/23 5\' 11"  (1.803 m)  10/02/22 5\' 11"  (1.803 m)  09/04/22 5\' 11"  (1.803 m)      General:  The patient is awake, alert and appears not in acute distress. The patient is well groomed. Head: Normocephalic, atraumatic. Neck is supple.  Mallampati: 3,  neck circumference:19.5 inches . Nasal airflow congested. Dental status: biological  Cardiovascular:  Regular rate and cardiac rhythm by pulse,  without distended neck veins. Respiratory: Lungs are clear to auscultation.  Skin:  Without evidence of ankle edema,but petechiae.  Trunk: The patient's posture is erect.   Neurologic exam : The patient is awake and alert, oriented to place and time.   Memory subjective described as mildly impaired, he feels slowed, less able to multi task.   Attention span & concentration ability appears normal.  Speech is fluent,  without  dysarthria, dysphonia or aphasia.  Mood and affect are  appropriate.   Cranial nerves: no loss of smell or taste reported  Pupils are equal and briskly reactive to light. Funduscopic exam deferred. .  Extraocular movements in vertical and horizontal planes were intact and without nystagmus. No Diplopia.  Facial motor strength is symmetric and tongue and uvula move midline.  Neck ROM : rotation, tilt and flexion extension were normal for age and shoulder shrug was symmetrical.    Motor exam:  Symmetric bulk, tone and ROM.  He works on Patent examiner.  Normal tone without cog-wheeling, symmetric grip strength .   Sensory:  deferred. NP    Coordination: Rapid alternating movements in the fingers/hands were of normal speed.  The Finger-to-nose maneuver was intact without evidence of ataxia, dysmetria or tremor.   Gait and station: Patient could rise unassisted from a  seated position, walked without assistive device.  Stance is of normal width/ base Toe and heel walk were deferred.   Neuropathy related gait stability impairment.  Deep tendon reflexes: in the upper and lower extremities are symmetric and intact.  Babinski response was deferred.    ASSESSMENT AND PLAN:   40 - year -old male  here with: CPAP dependence , OSA  at baseline- has CHF and EF 40-45% Dr Jacinto Halim.  Has an autoimmune condition, psoriasis .   The patient underwent a split-night protocol sleep study which confirmed severe apnea he did have some periodic limb movements but he did not wake up from those.  His oxygen nadir was 75% and he spent 16.2 minutes and oxygen hypoxemia.  Heart rate varied greatly between 71 bpm which is bradycardia and 68 bpm he was mainly bradycardic throughout the night.  The by CMS criteria his AHI was 35/h and slightly accentuated and REM sleep.  A new machine is needed, this one doesn't display any data anymore.      1) I could confirm that Mr. Speas has been using his CPAP highly compliant with 90 out of 90 days 88 days over 4 hours  consecutively with an average of 7 hours 21 minutes.  His CPAP is set at 15 cm water was 2 cm EPR and his residual AHI is 6.1.  Some residual apneas are obstructive so I think he would benefit from a new mask fit to reduce air leakage and also from a slight increase in pressure.  I will order a home sleep test for which the patient will have to sleep 1 night without CPAP.  I will offer him a sleep aid to make sure that we get 4 hours of valid test time.  Based on that I will order an outer titration device which should probably encompass between 7 and 17 cm water pressure and I will pay special attention to any residual hypoxia being seen on this home sleep test.  2)  HST ordered,   3) he uses a F 30 I by [employer] ,  medium size with tape over mustache .  I plan to follow up either personally or through our NP within 3 -5  months.   I would like to thank  Garlan Fillers, Md [address] [place],  [place] [address] for allowing me to meet with and to take care of this pleasant patient.   CC: I will share my notes with Dr Jacinto Halim. .  After spending a total time of  30  minutes face to face and additional time for physical and neurologic examination, review of laboratory studies,  personal review of imaging studies, reports and results of other testing and review of referral information / records as far as provided in visit,   Electronically signed by: Melvyn Novas, MD 05/28/2023 10:53 AM  [facility] Board certified by The [employer] of Sleep Medicine and Diplomate of the [employer] of Sleep Medicine. Board certified In Neurology through the ABPN, Fellow of the [employer] of Neurology.

## 2023-06-01 ENCOUNTER — Encounter: Payer: Self-pay | Admitting: Podiatry

## 2023-06-01 ENCOUNTER — Ambulatory Visit (INDEPENDENT_AMBULATORY_CARE_PROVIDER_SITE_OTHER): Payer: Medicare Other | Admitting: Podiatry

## 2023-06-01 DIAGNOSIS — M79675 Pain in left toe(s): Secondary | ICD-10-CM

## 2023-06-01 DIAGNOSIS — E1142 Type 2 diabetes mellitus with diabetic polyneuropathy: Secondary | ICD-10-CM

## 2023-06-01 DIAGNOSIS — B351 Tinea unguium: Secondary | ICD-10-CM | POA: Diagnosis not present

## 2023-06-01 NOTE — Progress Notes (Signed)
This patient returns to my office for at risk foot care.  This patient requires this care by a professional since this patient will be at risk due to having diabetic neuropathy and history of DVT.   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 This patient is unable to cut nails himself since the patient cannot reach his nails.These nails are painful walking and wearing shoes. This patient presents for at risk foot care today.  General Appearance  Alert, conversant and in no acute stress.  Vascular  Dorsalis pedis and posterior tibial  pulses are palpable  bilaterally.  Capillary return is within normal limits  bilaterally. Temperature is within normal limits  bilaterally.  Neurologic  Senn-Weinstein monofilament wire test within normal limits  Left foot.  LOPS absent rght foot.. Muscle power within normal limits bilaterally.  Nails Thick disfigured discolored nails with subungual debris hallux toenails. No evidence of bacterial infection or drainage bilaterally.  Orthopedic  No limitations of motion  feet .  No crepitus or effusions noted.  No bony pathology or digital deformities noted.  HAV  B/L.  DJD right midfoot.  Skin  normotropic skin with no porokeratosis noted bilaterally.  No signs of infections .  Healed ulcer left hallux.  Asymptomatic callus sub 5th left foot.  Onychomycosis  Pain in right toes  Pain in left toes  Consent was obtained for treatment procedures.   Mechanical debridement of nails  hallux  left. performed with a nail nipper.  Filed with dremel without incident.    Return office visit   6 months .                 Told patient to return for periodic foot care and evaluation due to potential at risk complications.   Helane Gunther DPM

## 2023-06-06 DIAGNOSIS — Z23 Encounter for immunization: Secondary | ICD-10-CM | POA: Diagnosis not present

## 2023-06-15 DIAGNOSIS — E669 Obesity, unspecified: Secondary | ICD-10-CM | POA: Diagnosis not present

## 2023-06-15 DIAGNOSIS — S22080A Wedge compression fracture of T11-T12 vertebra, initial encounter for closed fracture: Secondary | ICD-10-CM | POA: Diagnosis not present

## 2023-06-15 DIAGNOSIS — L4050 Arthropathic psoriasis, unspecified: Secondary | ICD-10-CM | POA: Diagnosis not present

## 2023-06-15 DIAGNOSIS — R7989 Other specified abnormal findings of blood chemistry: Secondary | ICD-10-CM | POA: Diagnosis not present

## 2023-06-15 DIAGNOSIS — L409 Psoriasis, unspecified: Secondary | ICD-10-CM | POA: Diagnosis not present

## 2023-06-15 DIAGNOSIS — M1991 Primary osteoarthritis, unspecified site: Secondary | ICD-10-CM | POA: Diagnosis not present

## 2023-06-15 DIAGNOSIS — Z6833 Body mass index (BMI) 33.0-33.9, adult: Secondary | ICD-10-CM | POA: Diagnosis not present

## 2023-06-15 DIAGNOSIS — Z79899 Other long term (current) drug therapy: Secondary | ICD-10-CM | POA: Diagnosis not present

## 2023-06-25 DIAGNOSIS — L4050 Arthropathic psoriasis, unspecified: Secondary | ICD-10-CM | POA: Diagnosis not present

## 2023-07-01 ENCOUNTER — Telehealth: Payer: Self-pay | Admitting: Neurology

## 2023-07-01 NOTE — Telephone Encounter (Signed)
HST- Medicare/bcbs supp no auth req   Patient is scheduled at [facility] for 07/15/23 at 8:30 AM.  Mailed packet to the patient.

## 2023-07-09 DIAGNOSIS — I5022 Chronic systolic (congestive) heart failure: Secondary | ICD-10-CM | POA: Diagnosis not present

## 2023-07-09 DIAGNOSIS — N5201 Erectile dysfunction due to arterial insufficiency: Secondary | ICD-10-CM | POA: Diagnosis not present

## 2023-07-09 DIAGNOSIS — I110 Hypertensive heart disease with heart failure: Secondary | ICD-10-CM | POA: Diagnosis not present

## 2023-07-09 DIAGNOSIS — R972 Elevated prostate specific antigen [PSA]: Secondary | ICD-10-CM | POA: Diagnosis not present

## 2023-07-09 DIAGNOSIS — I82402 Acute embolism and thrombosis of unspecified deep veins of left lower extremity: Secondary | ICD-10-CM | POA: Diagnosis not present

## 2023-07-09 DIAGNOSIS — N400 Enlarged prostate without lower urinary tract symptoms: Secondary | ICD-10-CM | POA: Diagnosis not present

## 2023-07-09 DIAGNOSIS — Z794 Long term (current) use of insulin: Secondary | ICD-10-CM | POA: Diagnosis not present

## 2023-07-09 DIAGNOSIS — D6859 Other primary thrombophilia: Secondary | ICD-10-CM | POA: Diagnosis not present

## 2023-07-09 DIAGNOSIS — E785 Hyperlipidemia, unspecified: Secondary | ICD-10-CM | POA: Diagnosis not present

## 2023-07-09 DIAGNOSIS — E1151 Type 2 diabetes mellitus with diabetic peripheral angiopathy without gangrene: Secondary | ICD-10-CM | POA: Diagnosis not present

## 2023-07-09 DIAGNOSIS — I700 Atherosclerosis of aorta: Secondary | ICD-10-CM | POA: Diagnosis not present

## 2023-07-15 ENCOUNTER — Ambulatory Visit: Payer: Medicare Other | Admitting: Neurology

## 2023-07-15 DIAGNOSIS — Z9989 Dependence on other enabling machines and devices: Secondary | ICD-10-CM

## 2023-07-15 DIAGNOSIS — I428 Other cardiomyopathies: Secondary | ICD-10-CM

## 2023-07-15 DIAGNOSIS — I420 Dilated cardiomyopathy: Secondary | ICD-10-CM

## 2023-07-15 DIAGNOSIS — G4733 Obstructive sleep apnea (adult) (pediatric): Secondary | ICD-10-CM | POA: Diagnosis not present

## 2023-07-15 DIAGNOSIS — R0609 Other forms of dyspnea: Secondary | ICD-10-CM

## 2023-07-15 DIAGNOSIS — I2602 Saddle embolus of pulmonary artery with acute cor pulmonale: Secondary | ICD-10-CM

## 2023-07-16 NOTE — Progress Notes (Signed)
[facility] B. Anthony Case" Male, 72 y.o., May 02, 1951 MRN: [id number]   HOME SLEEP TEST REPORT ( by Watch PAT)   STUDY DATE:  07-17-2023   PRIMARY NEUROLOGIST : Anthony Alias, DO  REFERRING CLINICIAN: Melvyn Novas, MD  Dr Anthony Case is PCP.   CLINICAL INFORMATION/HISTORY: 05-28-2023 CPAP dependent OSA patient ,   Anthony Case is a 72 y.o. male patient who is here for revisit 05/28/2023 for  CPAP compliance and new machine .  Chief information according to patient :  "I just got back form a 10 weeks trip in a camper [place] , [place].  [place]. I have kept my weight off, I am using my CPAP but it is now in sad shape ". Also: " I could not tolerate my previous  psoriasis medication, REMICAID and interacted with Entresto-  needed to change. Now on Cosentyx IV. Dr Anthony Case." He states his CPAP machine is from 2018. He has seen Dr. Jacinto Case, who described him as a 72 year old still gainfully employed Caucasian male with a history of pulmonary embolism in 2013 after a long travel following a spontaneous DVT earlier in 2009 for which she is lost to chronic on Xarelto.  He has a history of diabetes mellitus type 2, obstructive sleep apnea CPAP machine was ordered and there was a delay before he got the new mask- he has been using it since September 2023.  He presents today with blisters on his nasal bridge.  His CPAP machine use was 100% set at 10 cm water pressure was 2 cm EPR and with a residual AHI of 19.1 these are obstructive apneas so the current pressure is not high enough.  I think we need to open the outer titration since again and started from 7 to 14 cm water pressure in order to reduce the obstructive residual. He will receive also a new mask that does spare the nasal bridge I will order an F30 I fullface mask with nasal cradle.    Epworth sleepiness score:10/ 24 points   FSS endorsed at 33/ 63 points.  GDS at 1/ 15 points.    BMI: 35.5  kg/m   Neck Circumference: 18"   FINDINGS:   Sleep Summary:   Total Recording Time (hours, min):     6 hours 56 minutes .   Total Sleep Time (hours, min):    6 hours and 33 minutes.             Percent REM (%):    18.4%                                    Respiratory Indices by AASM criteria:   Calculated pAHI (per hour):   66.7/h                          REM pAHI:   67/h                                              NREM pAHI:   66.5/h                           Positional  AHI:   all sleep was recorded  in supine.                                                 Oxygen Saturation Statistics:     O2 Saturation Range (%):     between a nadir of 79% and maximum saturation of 98% with a mean saturation of 93%.                                   O2 Saturation (minutes) <89%:     13.2 minutes      Pulse Rate Statistics:   Pulse Mean (bpm):  71 bpm               Pulse Range:  between 47 and 116 bpm.   Please note that cardiac rhythm data cannot be obtained.                 IMPRESSION:  This HST confirms the presence of    RECOMMENDATION: A severe obstructive sleep apnea diagnosis is again confirmed. AH Indices of 66-67 /h by AASM criteria and 56/h by CMS criteria are considered to reflect a severe degree of apnea.  Continuation of CPAP therapy is necessary and and a new device will be immediately ordered.   The auto-titration CPAP device will be set to 7 through 16 cm water pressure with 2 cm EPR , heated humidification and his mask of choice.     INTERPRETING PHYSICIAN:   Anthony Novas, MD  [facility] Sleep Board certified by The [employer] of Sleep Medicine and Diplomate of the [employer] of Sleep Medicine. Board certified In Neurology through the ABPN, Fellow of the [employer] of Neu[employer].

## 2023-07-27 DIAGNOSIS — I739 Peripheral vascular disease, unspecified: Secondary | ICD-10-CM | POA: Diagnosis not present

## 2023-07-27 DIAGNOSIS — Q230 Congenital stenosis of aortic valve: Secondary | ICD-10-CM | POA: Diagnosis not present

## 2023-07-27 DIAGNOSIS — Z111 Encounter for screening for respiratory tuberculosis: Secondary | ICD-10-CM | POA: Diagnosis not present

## 2023-07-27 DIAGNOSIS — R5383 Other fatigue: Secondary | ICD-10-CM | POA: Diagnosis not present

## 2023-07-27 DIAGNOSIS — E1140 Type 2 diabetes mellitus with diabetic neuropathy, unspecified: Secondary | ICD-10-CM | POA: Diagnosis not present

## 2023-07-27 DIAGNOSIS — L4050 Arthropathic psoriasis, unspecified: Secondary | ICD-10-CM | POA: Diagnosis not present

## 2023-07-27 DIAGNOSIS — G4733 Obstructive sleep apnea (adult) (pediatric): Secondary | ICD-10-CM | POA: Diagnosis not present

## 2023-07-27 DIAGNOSIS — D6859 Other primary thrombophilia: Secondary | ICD-10-CM | POA: Diagnosis not present

## 2023-07-27 DIAGNOSIS — Z794 Long term (current) use of insulin: Secondary | ICD-10-CM | POA: Diagnosis not present

## 2023-07-27 DIAGNOSIS — Z79899 Other long term (current) drug therapy: Secondary | ICD-10-CM | POA: Diagnosis not present

## 2023-07-30 ENCOUNTER — Telehealth: Payer: Self-pay | Admitting: Neurology

## 2023-07-30 NOTE — Procedures (Signed)
[facility] [name]" Male, 72 y.o., May 02, 1951 MRN: [id number]   HOME SLEEP TEST REPORT ( by Watch PAT)   STUDY DATE:  07-17-2023   PRIMARY NEUROLOGIST : Anthony Alias, DO  REFERRING CLINICIAN: Melvyn Novas, MD  [name] is PCP.   CLINICAL INFORMATION/HISTORY: 05-28-2023 CPAP dependent OSA patient ,   CLEAVON Case is a 72 y.o. male patient who is here for revisit 05/28/2023 for  CPAP compliance and new machine .  Chief information according to patient :  "I just got back form a 10 weeks trip in a camper [place] , [place].  [place]. I have kept my weight off, I am using my CPAP but it is now in sad shape ". Also: " I could not tolerate my previous  psoriasis medication, REMICAID and interacted with Entresto-  needed to change. Now on Cosentyx IV. Dr Dierdre Forth." He states his CPAP machine is from 2018. He has seen Dr. 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  He has a history of diabetes mellitus type 2, obstructive sleep apnea CPAP machine was ordered and there was a delay before he got the new mask- he has been using it since September 2023.  He presents today with blisters on his nasal bridge.  His CPAP machine use was 100% set at 10 cm water pressure was 2 cm EPR and with a residual AHI of 19.1 these are obstructive apneas so the current pressure is not high enough.  I think we need to open the outer titration since again and started from 7 to 14 cm water pressure in order to reduce the obstructive residual. He will receive also a new mask that does spare the nasal bridge I will order an F30 I fullface mask with nasal cradle.    Epworth sleepiness score:10/ 24 points   FSS endorsed at 33/ 63 points.  GDS at 1/ 15 points.    BMI: 35.5  kg/m   Neck Circumference: 18"   FINDINGS:   Sleep Summary:   Total Recording Time (hours, min):     6 hours 56 minutes .   Total Sleep Time (hours, min):    6 hours and 33 minutes.             Percent REM (%):    18.4%                                    Respiratory Indices by AASM criteria:   Calculated pAHI (per hour):   66.7/h                          REM pAHI:   67/h                                              NREM pAHI:   66.5/h                           Positional  AHI:   all sleep was recorded  in supine.                                                 Oxygen Saturation Statistics:     O2 Saturation Range (%):     between a nadir of 79% and maximum saturation of 98% with a mean saturation of 93%.                                   O2 Saturation (minutes) <89%:     13.2 minutes      Pulse Rate Statistics:   Pulse Mean (bpm):  71 bpm               Pulse Range:  between 47 and 116 bpm.   Please note that cardiac rhythm data cannot be obtained.                 IMPRESSION:  This HST confirms the presence of    RECOMMENDATION: A severe obstructive sleep apnea diagnosis is again confirmed. AH Indices of 66-67 /h by AASM criteria and 56/h by CMS criteria are considered to reflect a severe degree of apnea.  Continuation of CPAP therapy is necessary and and a new device will be immediately ordered.   The auto-titration CPAP device will be set to 7 through 16 cm water pressure with 2 cm EPR , heated humidification and his mask of choice.     INTERPRETING PHYSICIAN:   Melvyn Novas, MD  [facility] Sleep Board certified by The [employer] of Sleep Medicine and Diplomate of the [employer] Sleep Medicine. Board certified In Neurology through the ABPN, Fellow of the [employer].

## 2023-07-30 NOTE — Telephone Encounter (Signed)
Spoke to pt informed pt Dr. Marylou Flesher was out the office and just can back this week.Pt states going out of town Aug 27 2023 and will be gone for 90 days and needs his new cpap machine . Made pt aware will send message to Dr Dohmeier to make her aware Pt expressed understanding and thanked me for calling.

## 2023-07-30 NOTE — Addendum Note (Signed)
Addended by: Melvyn Novas on: 07/30/2023 06:19 PM   Modules accepted: Orders

## 2023-07-30 NOTE — Telephone Encounter (Signed)
Pt called stating that he would like to get his cpap machine before the end of the year because he will be leaving out of town for 90 days after the holiday's.

## 2023-07-30 NOTE — Progress Notes (Signed)
A severe obstructive sleep apnea diagnosis was again confirmed. AH -Indices of 66-67 /h (by AASM criteria)and 56/h (by CMS criteria) are considered to reflect a severe degree of apnea.  Continuation of CPAP therapy is necessary and and a new device will be immediately ordered.   The auto-titration CPAP device will be set to 7 through 16 cm water pressure with 2 cm EPR , heated humidification and his mask of choice.

## 2023-08-03 NOTE — Telephone Encounter (Signed)
MC msg sent.

## 2023-08-03 NOTE — Telephone Encounter (Addendum)
URGENT autopap Received: Today Bobbye Morton, CMA  Zott, Stacy; Gamble, Tammy New orders have been placed for the above pt, DOB: 03-Feb-2051 Thanks FYI pt did state he  will be going out of town Aug 27 2023 and will be gone for 90 days.  Zott, Stacy  Latrena Benegas, Abbe Amsterdam, CMA; [place], Tammy Got It Thank You

## 2023-08-05 ENCOUNTER — Other Ambulatory Visit: Payer: Medicare Other

## 2023-08-10 DIAGNOSIS — E113293 Type 2 diabetes mellitus with mild nonproliferative diabetic retinopathy without macular edema, bilateral: Secondary | ICD-10-CM | POA: Diagnosis not present

## 2023-08-10 DIAGNOSIS — H2513 Age-related nuclear cataract, bilateral: Secondary | ICD-10-CM | POA: Diagnosis not present

## 2023-08-11 ENCOUNTER — Ambulatory Visit (HOSPITAL_COMMUNITY): Payer: Medicare Other | Attending: Cardiology

## 2023-08-11 DIAGNOSIS — I5022 Chronic systolic (congestive) heart failure: Secondary | ICD-10-CM | POA: Diagnosis not present

## 2023-08-11 DIAGNOSIS — I447 Left bundle-branch block, unspecified: Secondary | ICD-10-CM | POA: Diagnosis not present

## 2023-08-11 LAB — ECHOCARDIOGRAM COMPLETE
AR max vel: 1.21 cm2
AV Area VTI: 1.1 cm2
AV Area mean vel: 1.08 cm2
AV Mean grad: 10.5 mm[Hg]
AV Peak grad: 19.3 mm[Hg]
Ao pk vel: 2.2 m/s
Area-P 1/2: 2.76 cm2
S' Lateral: 3.8 cm

## 2023-08-12 DIAGNOSIS — Z23 Encounter for immunization: Secondary | ICD-10-CM | POA: Diagnosis not present

## 2023-08-13 ENCOUNTER — Encounter: Payer: Self-pay | Admitting: Cardiology

## 2023-08-13 ENCOUNTER — Ambulatory Visit: Payer: Medicare Other | Attending: Cardiology | Admitting: Cardiology

## 2023-08-13 VITALS — BP 132/62 | HR 64 | Resp 16 | Ht 71.0 in | Wt 263.4 lb

## 2023-08-13 DIAGNOSIS — I5022 Chronic systolic (congestive) heart failure: Secondary | ICD-10-CM | POA: Insufficient documentation

## 2023-08-13 DIAGNOSIS — I10 Essential (primary) hypertension: Secondary | ICD-10-CM | POA: Diagnosis not present

## 2023-08-13 DIAGNOSIS — I447 Left bundle-branch block, unspecified: Secondary | ICD-10-CM | POA: Insufficient documentation

## 2023-08-13 DIAGNOSIS — I428 Other cardiomyopathies: Secondary | ICD-10-CM | POA: Diagnosis not present

## 2023-08-13 DIAGNOSIS — D6859 Other primary thrombophilia: Secondary | ICD-10-CM | POA: Diagnosis not present

## 2023-08-13 NOTE — Patient Instructions (Signed)

## 2023-08-13 NOTE — Progress Notes (Signed)
Cardiology Office Note:  .   Date:  08/13/2023  ID:  Anthony Case, DOB November 29, 1950, MRN [id number] PCP: Garlan Fillers, MD  [facility] Providers Cardiologist:  Yates Decamp, MD   History of Present Illness: .   Anthony Case is a 72 y.o. 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   Discussed the use of AI scribe software for clinical note transcription with the patient, who gave verbal consent to proceed.  History of Present Illness   The patient, with a history of heart failure, diabetes, and a previous pulmonary embolism, presents for a routine follow-up. He reports a significant lifestyle change, having recently embarked on a ten-week trip across the country in a camper [place]. The patient notes that this change in lifestyle, including increased physical activity and healthier eating, has had a positive impact on his health. His A1c levels have improved from 7.7 to 4.3 during this period.  The patient also reports that he has been managing his conditions with various medications, including Farxiga, Metoprolol, Saxenda, and Entresto. He notes some concern about the number of medications he is taking but acknowledges their necessity. The patient also mentions experiencing worsening neuropathy.  In addition to his chronic conditions, the patient has been dealing with skin issues and arthritis of the feet,  which he has been managing with a biologic medication, Secukinumab (Cosentyx). He reports that this medication has helped clear up his skin but has not provided relief for his foot arthritis.        Review of Systems  Cardiovascular:  Negative for chest pain, dyspnea on exertion and leg swelling.    Labs    External Labs:  Labs 10/28/2022:  Total cholesterol 122, triglycerides 189, HDL 35, LDL 49.  A1c 07/27/2023: 6.0%.  Hb 14.2 on 05/26/2023 with serum creatinine of 0.910 and potassium of 4.200, creatinine clearance 77.  TSH 1.270 on 02/22/2019.  Physical Exam:   VS:  BP 132/62 (BP Location: Left Arm, Patient Position: Sitting, Cuff Size: Large)   Pulse 64   Resp 16   Ht 5\' 11"  (1.803 m)   Wt 263 lb 6.4 oz (119.5 kg)   SpO2 98%   BMI 36.74 kg/m    Wt Readings from Last 3 Encounters:  08/13/23 263 lb 6.4 oz (119.5 kg)  05/28/23 254 lb 9.6 oz (115.5 kg)  10/02/22 247 lb (112 kg)     Physical Exam Neck:     Vascular: No carotid bruit or JVD.  Cardiovascular:     Rate and Rhythm: Normal rate and regular rhythm.     Pulses: Intact distal pulses.     Heart sounds: Normal heart sounds. No murmur heard.    No gallop.  Pulmonary:     Effort: Pulmonary effort is normal.     Breath sounds: Normal breath sounds.  Abdominal:     General: Bowel sounds are normal.     Palpations: Abdomen is soft.  Musculoskeletal:  Right lower leg: No edema.     Left lower leg: No edema.     Studies Reviewed: .    Right and left heart catheterization 08/27/2021: No pulmonary hypertension, mild coronary artery disease.    ECHOCARDIOGRAM COMPLETE 08/11/2023  1. Left ventricular ejection fraction, by estimation, is 40 to 45%. Left ventricular ejection fraction by 3D volume is 42 %. The left ventricle has mildly decreased function. The left ventricle demonstrates global hypokinesis. There is mild left ventricular hypertrophy. Left ventricular diastolic parameters are consistent with Grade  I diastolic dysfunction (impaired relaxation). 2. Right ventricular systolic function is normal. The right ventricular size is normal. 3. The mitral valve is normal in structure. Mild mitral valve regurgitation. No evidence of mitral stenosis. 4. The aortic valve has an indeterminant number of cusps. There is moderate calcification of the aortic valve. There is moderate thickening of the aortic valve. Aortic valve regurgitation is not visualized. Mild aortic valve stenosis. Aortic valve mean gradient measures 10.5 mmHg. Aortic valve Vmax measures 2.20 m/s. 5. Aortic dilatation noted. There is mild dilatation of the ascending aorta, measuring 41 mm. 6. The inferior vena cava is normal in size with greater than 50% respiratory variability, suggesting right atrial pressure of 3 mmHg. No significant change from 08/27/2022.  EKG:    EKG Interpretation Date/Time:  Thursday August 13 2023 10:45:35 EST Ventricular Rate:  83 PR Interval:  266 QRS Duration:  150 QT Interval:  426 QTC Calculation: 500 R Axis:   -56  Text Interpretation: EKG 08/13/2023: Sinus rhythm first-degree block at rate of 83 bpm, left bundle branch block.  No further analysis.  No significant change from 11/08/2021. Confirmed by Delrae Rend [phone]) on 08/13/2023 11:30:03 AM    Medications and allergies    Allergies  Allergen Reactions   Ace Inhibitors Other (See Comments)   Lisinopril Cough     Current Outpatient Medications:    acetaminophen (TYLENOL) 500 MG tablet, Take 1,000 mg by mouth every 6 (six) hours as needed for moderate pain., Disp: , Rfl:    Ascorbic Acid (VITAMIN C) 1000 MG tablet, Take 1,000 mg by mouth daily., Disp: , Rfl:    atorvastatin (LIPITOR) 20 MG tablet, Take 20 mg by mouth daily., Disp: , Rfl:    augmented betamethasone dipropionate (DIPROLENE-AF) 0.05 % cream, Apply 1 Application topically in the morning and at bedtime., Disp: , Rfl:    cholecalciferol (VITAMIN D3) 25 MCG (1000 UNIT)  tablet, Take 1,000 Units by mouth daily., Disp: , Rfl:    dapagliflozin propanediol (FARXIGA) 10 MG TABS tablet, Take 10 mg by mouth daily., Disp: , Rfl:    ezetimibe (ZETIA) 10 MG tablet, Take 1 tablet (10 mg total) by mouth daily., Disp: 90 tablet, Rfl: 3   glipiZIDE (GLUCOTROL XL) 2.5 MG 24 hr tablet, Take 5 mg by mouth daily with breakfast., Disp: , Rfl:    HYDROcodone-acetaminophen (NORCO/VICODIN) 5-325 MG tablet, Take 1 tablet by mouth every 6 (six) hours as needed for moderate pain (pain score 4-6)., Disp: , Rfl:    Insulin Degludec (TRESIBA FLEXTOUCH [place]), Inject 60 Units into the skin daily at 2 PM., Disp: , Rfl:    metFORMIN (GLUCOPHAGE) 500 MG tablet, Take 500 mg by mouth 2 (two) times daily with a meal., Disp: , Rfl:    methotrexate (RHEUMATREX) 2.5 MG tablet, Take 15 mg by mouth once a week., Disp: , Rfl:    metoprolol succinate (TOPROL-XL) 50 MG 24 hr tablet, Take 1 tablet (50 mg  total) by mouth daily., Disp: 90 tablet, Rfl: 1   Multiple Vitamin (MULTIVITAMIN WITH MINERALS) TABS tablet, Take 1 tablet by mouth daily., Disp: , Rfl:    Omega-3 Fatty Acids (FISH OIL) 1000 MG CAPS, Take 1,000 mg by mouth daily., Disp: , Rfl:    rivaroxaban (XARELTO) 20 MG TABS tablet, Take 1 tablet (20 mg total) by mouth daily. 08/04/13 last dose of xarelto in preparation for colonoscopy, Disp: 30 tablet, Rfl:    sacubitril-valsartan (ENTRESTO) 97-103 MG, Take 1 tablet by mouth 2 (two) times daily., Disp: 180 tablet, Rfl: 3   Secukinumab (COSENTYX IV), Inject into the vein., Disp: , Rfl:    Vitamin D, Ergocalciferol, 50000 units CAPS, Take 1 capsule by mouth once a week Oral, Disp: , Rfl:    ASSESSMENT AND PLAN: .      ICD-10-CM   1. Chronic heart failure with mildly reduced ejection fraction (HFmrEF, 41-49%) (HCC)  I50.22 EKG 12-Lead    2. Non-ischemic cardiomyopathy (HCC)  I42.8     3. Essential hypertension  I10     4. LBBB (left bundle branch block)  I44.7     5. Primary hypercoagulable state  (HCC)  D68.59      Assessment and Plan    Chronic Heart Failure with mildly reduced ejection fraction Ejection fraction improved from 25-30% to 45%. Stable over the past year. -Continue Farxiga, Entresto, and Metoprolol Succinate.  Non-obstructive Coronary Artery Disease Minor blockages, cholesterol levels are optimal. -Continue Atorvastatin 20mg  daily.  Hypertension Well controlled with current medications. -Continue Farxiga and Metoprolol Succinate.  Left Bundle Branch Block No change in EKG. -No change in therapy.  Pulmonary Embolism (history) On anticoagulation therapy. -Continue Xarelto.  Follow-up in 1 year.    Signed,  Yates Decamp, MD, [facility] 08/13/2023, 11:30 AM [facility] [address] [place], [place] [address] Phone: [phone]. Fax:  [phone]

## 2023-08-21 DIAGNOSIS — M1991 Primary osteoarthritis, unspecified site: Secondary | ICD-10-CM | POA: Diagnosis not present

## 2023-08-21 DIAGNOSIS — L4050 Arthropathic psoriasis, unspecified: Secondary | ICD-10-CM | POA: Diagnosis not present

## 2023-08-21 DIAGNOSIS — E669 Obesity, unspecified: Secondary | ICD-10-CM | POA: Diagnosis not present

## 2023-08-21 DIAGNOSIS — I5020 Unspecified systolic (congestive) heart failure: Secondary | ICD-10-CM | POA: Diagnosis not present

## 2023-08-21 DIAGNOSIS — Z79899 Other long term (current) drug therapy: Secondary | ICD-10-CM | POA: Diagnosis not present

## 2023-08-21 DIAGNOSIS — R7989 Other specified abnormal findings of blood chemistry: Secondary | ICD-10-CM | POA: Diagnosis not present

## 2023-08-21 DIAGNOSIS — S22080A Wedge compression fracture of T11-T12 vertebra, initial encounter for closed fracture: Secondary | ICD-10-CM | POA: Diagnosis not present

## 2023-08-21 DIAGNOSIS — L409 Psoriasis, unspecified: Secondary | ICD-10-CM | POA: Diagnosis not present

## 2023-08-21 DIAGNOSIS — Z6834 Body mass index (BMI) 34.0-34.9, adult: Secondary | ICD-10-CM | POA: Diagnosis not present

## 2023-08-25 DIAGNOSIS — Z79899 Other long term (current) drug therapy: Secondary | ICD-10-CM | POA: Diagnosis not present

## 2023-08-25 DIAGNOSIS — L4050 Arthropathic psoriasis, unspecified: Secondary | ICD-10-CM | POA: Diagnosis not present

## 2023-09-25 ENCOUNTER — Other Ambulatory Visit (HOSPITAL_BASED_OUTPATIENT_CLINIC_OR_DEPARTMENT_OTHER): Payer: Self-pay | Admitting: Family Medicine

## 2023-09-25 ENCOUNTER — Ambulatory Visit (HOSPITAL_BASED_OUTPATIENT_CLINIC_OR_DEPARTMENT_OTHER)
Admission: RE | Admit: 2023-09-25 | Discharge: 2023-09-25 | Disposition: A | Discharge status: Home / Self Care | Payer: Medicare Other | Source: Ambulatory Visit | Attending: Family Medicine | Admitting: Family Medicine

## 2023-09-25 DIAGNOSIS — I82402 Acute embolism and thrombosis of unspecified deep veins of left lower extremity: Secondary | ICD-10-CM | POA: Diagnosis not present

## 2023-09-25 DIAGNOSIS — Z7901 Long term (current) use of anticoagulants: Secondary | ICD-10-CM | POA: Diagnosis not present

## 2023-09-25 DIAGNOSIS — E11622 Type 2 diabetes mellitus with other skin ulcer: Secondary | ICD-10-CM | POA: Diagnosis present

## 2023-09-25 DIAGNOSIS — L98499 Non-pressure chronic ulcer of skin of other sites with unspecified severity: Secondary | ICD-10-CM | POA: Diagnosis not present

## 2023-09-25 DIAGNOSIS — R7982 Elevated C-reactive protein (CRP): Secondary | ICD-10-CM | POA: Diagnosis present

## 2023-09-25 DIAGNOSIS — I110 Hypertensive heart disease with heart failure: Secondary | ICD-10-CM | POA: Diagnosis present

## 2023-09-25 DIAGNOSIS — D849 Immunodeficiency, unspecified: Secondary | ICD-10-CM | POA: Diagnosis present

## 2023-09-25 DIAGNOSIS — Z86718 Personal history of other venous thrombosis and embolism: Secondary | ICD-10-CM | POA: Diagnosis not present

## 2023-09-25 DIAGNOSIS — E785 Hyperlipidemia, unspecified: Secondary | ICD-10-CM | POA: Diagnosis not present

## 2023-09-25 DIAGNOSIS — I739 Peripheral vascular disease, unspecified: Secondary | ICD-10-CM | POA: Diagnosis not present

## 2023-09-25 DIAGNOSIS — M79661 Pain in right lower leg: Secondary | ICD-10-CM | POA: Diagnosis not present

## 2023-09-25 DIAGNOSIS — E11621 Type 2 diabetes mellitus with foot ulcer: Secondary | ICD-10-CM | POA: Diagnosis not present

## 2023-09-25 DIAGNOSIS — E11628 Type 2 diabetes mellitus with other skin complications: Secondary | ICD-10-CM | POA: Diagnosis present

## 2023-09-25 DIAGNOSIS — Z794 Long term (current) use of insulin: Secondary | ICD-10-CM | POA: Diagnosis not present

## 2023-09-25 DIAGNOSIS — M79604 Pain in right leg: Secondary | ICD-10-CM | POA: Diagnosis present

## 2023-09-25 DIAGNOSIS — I959 Hypotension, unspecified: Secondary | ICD-10-CM | POA: Diagnosis present

## 2023-09-25 DIAGNOSIS — S91101A Unspecified open wound of right great toe without damage to nail, initial encounter: Secondary | ICD-10-CM | POA: Diagnosis not present

## 2023-09-25 DIAGNOSIS — R600 Localized edema: Secondary | ICD-10-CM | POA: Diagnosis not present

## 2023-09-25 DIAGNOSIS — L4050 Arthropathic psoriasis, unspecified: Secondary | ICD-10-CM | POA: Diagnosis not present

## 2023-09-25 DIAGNOSIS — R0602 Shortness of breath: Secondary | ICD-10-CM | POA: Diagnosis not present

## 2023-09-25 DIAGNOSIS — Z86711 Personal history of pulmonary embolism: Secondary | ICD-10-CM | POA: Diagnosis not present

## 2023-09-25 DIAGNOSIS — E16A1 Hypoglycemia level 1: Secondary | ICD-10-CM | POA: Diagnosis present

## 2023-09-25 DIAGNOSIS — Z833 Family history of diabetes mellitus: Secondary | ICD-10-CM | POA: Diagnosis not present

## 2023-09-25 DIAGNOSIS — E1142 Type 2 diabetes mellitus with diabetic polyneuropathy: Secondary | ICD-10-CM | POA: Diagnosis present

## 2023-09-25 DIAGNOSIS — E669 Obesity, unspecified: Secondary | ICD-10-CM | POA: Diagnosis not present

## 2023-09-25 DIAGNOSIS — D6859 Other primary thrombophilia: Secondary | ICD-10-CM | POA: Diagnosis not present

## 2023-09-25 DIAGNOSIS — N289 Disorder of kidney and ureter, unspecified: Secondary | ICD-10-CM | POA: Diagnosis not present

## 2023-09-25 DIAGNOSIS — D649 Anemia, unspecified: Secondary | ICD-10-CM | POA: Diagnosis not present

## 2023-09-25 DIAGNOSIS — E1140 Type 2 diabetes mellitus with diabetic neuropathy, unspecified: Secondary | ICD-10-CM | POA: Diagnosis not present

## 2023-09-25 DIAGNOSIS — I10 Essential (primary) hypertension: Secondary | ICD-10-CM | POA: Diagnosis not present

## 2023-09-25 DIAGNOSIS — G4733 Obstructive sleep apnea (adult) (pediatric): Secondary | ICD-10-CM | POA: Diagnosis not present

## 2023-09-25 DIAGNOSIS — L97519 Non-pressure chronic ulcer of other part of right foot with unspecified severity: Secondary | ICD-10-CM | POA: Diagnosis not present

## 2023-09-25 DIAGNOSIS — Z7984 Long term (current) use of oral hypoglycemic drugs: Secondary | ICD-10-CM | POA: Diagnosis not present

## 2023-09-25 DIAGNOSIS — I5022 Chronic systolic (congestive) heart failure: Secondary | ICD-10-CM | POA: Diagnosis not present

## 2023-09-25 DIAGNOSIS — L03031 Cellulitis of right toe: Secondary | ICD-10-CM | POA: Diagnosis not present

## 2023-09-25 DIAGNOSIS — Z8249 Family history of ischemic heart disease and other diseases of the circulatory system: Secondary | ICD-10-CM | POA: Diagnosis not present

## 2023-09-25 DIAGNOSIS — E7800 Pure hypercholesterolemia, unspecified: Secondary | ICD-10-CM | POA: Diagnosis present

## 2023-09-25 DIAGNOSIS — M19071 Primary osteoarthritis, right ankle and foot: Secondary | ICD-10-CM | POA: Diagnosis not present

## 2023-09-25 DIAGNOSIS — L089 Local infection of the skin and subcutaneous tissue, unspecified: Secondary | ICD-10-CM | POA: Diagnosis not present

## 2023-09-25 DIAGNOSIS — E11649 Type 2 diabetes mellitus with hypoglycemia without coma: Secondary | ICD-10-CM | POA: Diagnosis present

## 2023-09-25 DIAGNOSIS — L03115 Cellulitis of right lower limb: Secondary | ICD-10-CM | POA: Diagnosis not present

## 2023-09-27 ENCOUNTER — Encounter (HOSPITAL_COMMUNITY): Payer: Self-pay

## 2023-09-27 ENCOUNTER — Other Ambulatory Visit: Payer: Self-pay

## 2023-09-27 ENCOUNTER — Emergency Department (HOSPITAL_COMMUNITY): Payer: Medicare Other

## 2023-09-27 ENCOUNTER — Inpatient Hospital Stay (HOSPITAL_COMMUNITY)
Admission: EM | Admit: 2023-09-27 | Discharge: 2023-09-29 | DRG: 638 | Disposition: A | Discharge status: Home / Self Care | Payer: Medicare Other | Attending: Student | Admitting: Student

## 2023-09-27 DIAGNOSIS — Z8249 Family history of ischemic heart disease and other diseases of the circulatory system: Secondary | ICD-10-CM | POA: Diagnosis not present

## 2023-09-27 DIAGNOSIS — Z86711 Personal history of pulmonary embolism: Secondary | ICD-10-CM | POA: Diagnosis present

## 2023-09-27 DIAGNOSIS — E7800 Pure hypercholesterolemia, unspecified: Secondary | ICD-10-CM | POA: Diagnosis present

## 2023-09-27 DIAGNOSIS — I10 Essential (primary) hypertension: Secondary | ICD-10-CM | POA: Diagnosis present

## 2023-09-27 DIAGNOSIS — L03115 Cellulitis of right lower limb: Principal | ICD-10-CM | POA: Diagnosis present

## 2023-09-27 DIAGNOSIS — Z7984 Long term (current) use of oral hypoglycemic drugs: Secondary | ICD-10-CM

## 2023-09-27 DIAGNOSIS — M79604 Pain in right leg: Secondary | ICD-10-CM | POA: Diagnosis present

## 2023-09-27 DIAGNOSIS — D849 Immunodeficiency, unspecified: Secondary | ICD-10-CM | POA: Diagnosis present

## 2023-09-27 DIAGNOSIS — I5022 Chronic systolic (congestive) heart failure: Secondary | ICD-10-CM | POA: Diagnosis present

## 2023-09-27 DIAGNOSIS — L4050 Arthropathic psoriasis, unspecified: Secondary | ICD-10-CM | POA: Diagnosis present

## 2023-09-27 DIAGNOSIS — E16A1 Hypoglycemia level 1: Secondary | ICD-10-CM | POA: Diagnosis present

## 2023-09-27 DIAGNOSIS — L98499 Non-pressure chronic ulcer of skin of other sites with unspecified severity: Secondary | ICD-10-CM | POA: Diagnosis not present

## 2023-09-27 DIAGNOSIS — R0602 Shortness of breath: Secondary | ICD-10-CM | POA: Diagnosis not present

## 2023-09-27 DIAGNOSIS — E785 Hyperlipidemia, unspecified: Secondary | ICD-10-CM | POA: Diagnosis not present

## 2023-09-27 DIAGNOSIS — E11622 Type 2 diabetes mellitus with other skin ulcer: Secondary | ICD-10-CM | POA: Diagnosis present

## 2023-09-27 DIAGNOSIS — L97519 Non-pressure chronic ulcer of other part of right foot with unspecified severity: Secondary | ICD-10-CM | POA: Diagnosis present

## 2023-09-27 DIAGNOSIS — Z794 Long term (current) use of insulin: Secondary | ICD-10-CM | POA: Diagnosis not present

## 2023-09-27 DIAGNOSIS — Z86718 Personal history of other venous thrombosis and embolism: Secondary | ICD-10-CM

## 2023-09-27 DIAGNOSIS — D649 Anemia, unspecified: Secondary | ICD-10-CM | POA: Diagnosis present

## 2023-09-27 DIAGNOSIS — G4733 Obstructive sleep apnea (adult) (pediatric): Secondary | ICD-10-CM

## 2023-09-27 DIAGNOSIS — I5020 Unspecified systolic (congestive) heart failure: Secondary | ICD-10-CM | POA: Diagnosis present

## 2023-09-27 DIAGNOSIS — E11621 Type 2 diabetes mellitus with foot ulcer: Secondary | ICD-10-CM | POA: Diagnosis present

## 2023-09-27 DIAGNOSIS — R7982 Elevated C-reactive protein (CRP): Secondary | ICD-10-CM | POA: Diagnosis present

## 2023-09-27 DIAGNOSIS — I110 Hypertensive heart disease with heart failure: Secondary | ICD-10-CM | POA: Diagnosis present

## 2023-09-27 DIAGNOSIS — E1140 Type 2 diabetes mellitus with diabetic neuropathy, unspecified: Secondary | ICD-10-CM | POA: Diagnosis present

## 2023-09-27 DIAGNOSIS — I959 Hypotension, unspecified: Secondary | ICD-10-CM | POA: Diagnosis present

## 2023-09-27 DIAGNOSIS — L089 Local infection of the skin and subcutaneous tissue, unspecified: Secondary | ICD-10-CM | POA: Diagnosis not present

## 2023-09-27 DIAGNOSIS — N289 Disorder of kidney and ureter, unspecified: Secondary | ICD-10-CM | POA: Diagnosis not present

## 2023-09-27 DIAGNOSIS — M19071 Primary osteoarthritis, right ankle and foot: Secondary | ICD-10-CM | POA: Diagnosis not present

## 2023-09-27 DIAGNOSIS — E11649 Type 2 diabetes mellitus with hypoglycemia without coma: Secondary | ICD-10-CM | POA: Diagnosis present

## 2023-09-27 DIAGNOSIS — Z888 Allergy status to other drugs, medicaments and biological substances status: Secondary | ICD-10-CM

## 2023-09-27 DIAGNOSIS — Z833 Family history of diabetes mellitus: Secondary | ICD-10-CM | POA: Diagnosis not present

## 2023-09-27 DIAGNOSIS — E669 Obesity, unspecified: Secondary | ICD-10-CM | POA: Diagnosis present

## 2023-09-27 DIAGNOSIS — E1142 Type 2 diabetes mellitus with diabetic polyneuropathy: Secondary | ICD-10-CM | POA: Diagnosis present

## 2023-09-27 DIAGNOSIS — Z79899 Other long term (current) drug therapy: Secondary | ICD-10-CM

## 2023-09-27 DIAGNOSIS — E11628 Type 2 diabetes mellitus with other skin complications: Secondary | ICD-10-CM | POA: Diagnosis present

## 2023-09-27 DIAGNOSIS — Z7901 Long term (current) use of anticoagulants: Secondary | ICD-10-CM

## 2023-09-27 DIAGNOSIS — Z6836 Body mass index (BMI) 36.0-36.9, adult: Secondary | ICD-10-CM

## 2023-09-27 DIAGNOSIS — M1990 Unspecified osteoarthritis, unspecified site: Secondary | ICD-10-CM | POA: Diagnosis present

## 2023-09-27 DIAGNOSIS — L0390 Cellulitis, unspecified: Secondary | ICD-10-CM | POA: Diagnosis present

## 2023-09-27 HISTORY — DX: Arthropathic psoriasis, unspecified: L40.50

## 2023-09-27 LAB — HEMOGLOBIN A1C
Hgb A1c MFr Bld: 6 % — ABNORMAL HIGH (ref 4.8–5.6)
Mean Plasma Glucose: 125.5 mg/dL

## 2023-09-27 LAB — CBC
HCT: 39.5 % (ref 39.0–52.0)
Hemoglobin: 12.9 g/dL — ABNORMAL LOW (ref 13.0–17.0)
MCH: 30.4 pg (ref 26.0–34.0)
MCHC: 32.7 g/dL (ref 30.0–36.0)
MCV: 93.2 fL (ref 80.0–100.0)
Platelets: 272 10*3/uL (ref 150–400)
RBC: 4.24 MIL/uL (ref 4.22–5.81)
RDW: 14.8 % (ref 11.5–15.5)
WBC: 6.9 10*3/uL (ref 4.0–10.5)
nRBC: 0 % (ref 0.0–0.2)

## 2023-09-27 LAB — BASIC METABOLIC PANEL
Anion gap: 10 (ref 5–15)
BUN: 29 mg/dL — ABNORMAL HIGH (ref 8–23)
CO2: 20 mmol/L — ABNORMAL LOW (ref 22–32)
Calcium: 9.1 mg/dL (ref 8.9–10.3)
Chloride: 107 mmol/L (ref 98–111)
Creatinine, Ser: 1.25 mg/dL — ABNORMAL HIGH (ref 0.61–1.24)
GFR, Estimated: >60 mL/min (ref >60)
Glucose, Bld: 178 mg/dL — ABNORMAL HIGH (ref 70–99)
Potassium: 3.9 mmol/L (ref 3.5–5.1)
Sodium: 137 mmol/L (ref 135–145)

## 2023-09-27 LAB — URINALYSIS, ROUTINE W REFLEX MICROSCOPIC
Bacteria, UA: NONE SEEN
Bilirubin Urine: NEGATIVE
Glucose, UA: >=500 mg/dL — AB
Hgb urine dipstick: NEGATIVE
Ketones, ur: NEGATIVE mg/dL
Leukocytes,Ua: NEGATIVE
Nitrite: NEGATIVE
Protein, ur: 30 mg/dL — AB
Specific Gravity, Urine: 1.027 (ref 1.005–1.030)
pH: 5 (ref 5.0–8.0)

## 2023-09-27 LAB — GLUCOSE, CAPILLARY
Glucose-Capillary: 56 mg/dL — ABNORMAL LOW (ref 70–99)
Glucose-Capillary: 103 mg/dL — ABNORMAL HIGH (ref 70–99)
Glucose-Capillary: 66 mg/dL — ABNORMAL LOW (ref 70–99)

## 2023-09-27 LAB — C-REACTIVE PROTEIN: CRP: 11.4 mg/dL — ABNORMAL HIGH (ref <1.0)

## 2023-09-27 LAB — SEDIMENTATION RATE: Sed Rate: 50 mm/h — ABNORMAL HIGH (ref 0–16)

## 2023-09-27 MED ORDER — ATORVASTATIN CALCIUM 10 MG PO TABS
20.0000 mg | ORAL_TABLET | Freq: Every evening | ORAL | Status: DC
  Administered 2023-09-28: 20 mg via ORAL
  Filled 2023-09-27 (×2): qty 2

## 2023-09-27 MED ORDER — HYDROCODONE-ACETAMINOPHEN 5-325 MG PO TABS: 1.0000 | ORAL_TABLET | Freq: Four times a day (QID) | ORAL | Status: DC | PRN

## 2023-09-27 MED ORDER — INSULIN GLARGINE-YFGN 100 UNIT/ML ~~LOC~~ SOLN
50.0000 [IU] | Freq: Every day | SUBCUTANEOUS | Status: DC
Start: 2023-09-27 — End: 2023-09-27

## 2023-09-27 MED ORDER — INSULIN ASPART 100 UNIT/ML IJ SOLN: 0.0000 [IU] | Freq: Three times a day (TID) | INTRAMUSCULAR | Status: DC

## 2023-09-27 MED ORDER — BETAMETHASONE DIPROPIONATE AUG 0.05 % EX CREA: 1.0000 | TOPICAL_CREAM | Freq: Two times a day (BID) | CUTANEOUS | Status: DC | PRN

## 2023-09-27 MED ORDER — SODIUM CHLORIDE 0.9 % IV SOLN: 100.0000 mg | Freq: Once | INTRAVENOUS | Status: DC

## 2023-09-27 MED ORDER — FLUOCINONIDE 0.05 % EX CREA: TOPICAL_CREAM | Freq: Two times a day (BID) | CUTANEOUS | Status: DC | PRN

## 2023-09-27 MED ORDER — ONDANSETRON HCL 4 MG PO TABS: 4.0000 mg | ORAL_TABLET | Freq: Four times a day (QID) | ORAL | Status: DC | PRN

## 2023-09-27 MED ORDER — FOLIC ACID 1 MG PO TABS
1.0000 mg | ORAL_TABLET | Freq: Every day | ORAL | Status: DC
  Administered 2023-09-28 – 2023-09-29 (×2): 1 mg via ORAL
  Filled 2023-09-27 (×3): qty 1

## 2023-09-27 MED ORDER — HYDROCODONE-ACETAMINOPHEN 5-325 MG PO TABS
1.0000 | ORAL_TABLET | Freq: Four times a day (QID) | ORAL | Status: DC | PRN
  Administered 2023-09-27: 2 via ORAL
  Filled 2023-09-27: qty 2

## 2023-09-27 MED ORDER — ENOXAPARIN SODIUM 60 MG/0.6ML IJ SOSY: 60.0000 mg | PREFILLED_SYRINGE | INTRAMUSCULAR | Status: DC

## 2023-09-27 MED ORDER — RIVAROXABAN 20 MG PO TABS
20.0000 mg | ORAL_TABLET | Freq: Every day | ORAL | Status: DC
Start: 2023-09-28 — End: 2023-09-29
  Administered 2023-09-28 – 2023-09-29 (×2): 20 mg via ORAL
  Filled 2023-09-27 (×2): qty 1

## 2023-09-27 MED ORDER — SODIUM CHLORIDE 0.9 % IV SOLN
2.0000 g | INTRAVENOUS | Status: DC
  Administered 2023-09-27 – 2023-09-29 (×3): 2 g via INTRAVENOUS
  Filled 2023-09-27 (×2): qty 20

## 2023-09-27 MED ORDER — ACETAMINOPHEN 325 MG PO TABS: 650.0000 mg | ORAL_TABLET | Freq: Four times a day (QID) | ORAL | Status: DC | PRN

## 2023-09-27 MED ORDER — INSULIN GLARGINE-YFGN 100 UNIT/ML ~~LOC~~ SOLN
45.0000 [IU] | Freq: Every day | SUBCUTANEOUS | Status: DC
  Administered 2023-09-27: 45 [IU] via SUBCUTANEOUS
  Filled 2023-09-27 (×2): qty 0.45

## 2023-09-27 MED ORDER — VITAMIN C 500 MG PO TABS
1000.0000 mg | ORAL_TABLET | Freq: Every day | ORAL | Status: DC
  Administered 2023-09-28 – 2023-09-29 (×2): 1000 mg via ORAL
  Filled 2023-09-27 (×3): qty 2

## 2023-09-27 MED ORDER — METOPROLOL SUCCINATE ER 25 MG PO TB24
50.0000 mg | ORAL_TABLET | Freq: Every day | ORAL | Status: DC
  Administered 2023-09-28 – 2023-09-29 (×2): 50 mg via ORAL
  Filled 2023-09-27 (×2): qty 2

## 2023-09-27 MED ORDER — ONDANSETRON HCL 4 MG/2ML IJ SOLN: 4.0000 mg | Freq: Four times a day (QID) | INTRAMUSCULAR | Status: DC | PRN

## 2023-09-27 MED ORDER — SODIUM CHLORIDE 0.9% FLUSH
3.0000 mL | Freq: Two times a day (BID) | INTRAVENOUS | Status: DC
  Administered 2023-09-27 – 2023-09-29 (×4): 3 mL via INTRAVENOUS

## 2023-09-27 MED ORDER — DAPAGLIFLOZIN PROPANEDIOL 10 MG PO TABS
10.0000 mg | ORAL_TABLET | Freq: Every day | ORAL | Status: DC
  Administered 2023-09-28 – 2023-09-29 (×2): 10 mg via ORAL
  Filled 2023-09-27 (×2): qty 1

## 2023-09-27 MED ORDER — ALBUTEROL SULFATE (2.5 MG/3ML) 0.083% IN NEBU: 2.5000 mg | INHALATION_SOLUTION | Freq: Four times a day (QID) | RESPIRATORY_TRACT | Status: DC | PRN

## 2023-09-27 MED ORDER — SACUBITRIL-VALSARTAN 97-103 MG PO TABS
1.0000 | ORAL_TABLET | Freq: Two times a day (BID) | ORAL | Status: DC
Start: 2023-09-27 — End: 2023-09-29
  Administered 2023-09-27 – 2023-09-29 (×4): 1 via ORAL
  Filled 2023-09-27 (×5): qty 1

## 2023-09-27 MED ORDER — ACETAMINOPHEN 650 MG RE SUPP: 650.0000 mg | Freq: Four times a day (QID) | RECTAL | Status: DC | PRN

## 2023-09-27 MED ORDER — EZETIMIBE 10 MG PO TABS
10.0000 mg | ORAL_TABLET | Freq: Every day | ORAL | Status: DC
  Administered 2023-09-28 – 2023-09-29 (×2): 10 mg via ORAL
  Filled 2023-09-27 (×2): qty 1

## 2023-09-27 MED ORDER — GABAPENTIN 100 MG PO CAPS
100.0000 mg | ORAL_CAPSULE | Freq: Three times a day (TID) | ORAL | Status: DC
  Administered 2023-09-27 – 2023-09-29 (×6): 100 mg via ORAL
  Filled 2023-09-27 (×6): qty 1

## 2023-09-27 NOTE — Plan of Care (Signed)

## 2023-09-27 NOTE — ED Provider Notes (Signed)
[facility] AT [facility] Provider Note   CSN: [id number] Arrival date & time: 09/27/23  0620     History  Chief Complaint  Patient presents with   Leg Pain    Anthony Case is a 73 y.o. male with history of diabetes diabetic neuropathy, high cholesterol, DVT and PE on Xarelto, presenting to the ED with complaint of blistering redness and pain in his right lower extremity.  Patient reports he developed a blister on his right large toe approximately 2 months ago in [place]  He was treating this with Neosporin.  He says about 10 days ago he noticed that there was redness spreading up his leg on the right side with some swelling.  He went to see his PCP who performed a DVT study as an outpatient earlier this week, that was negative, and started him on doxycycline 2 days ago.  He has been taking this medication, feels that the redness spread has arrested but not improved.  He continues to complain of swelling and significant pain when trying to stand on his right leg.   He denies to me any chest pain or shortness of breath.  He reports in triage he told him that he had dyspnea on exertion which has been ongoing for him for about 2 months.  He is compliant with his Xarelto.  He also reports that he has chronic borderline hypotension, which is something he is monitoring with his PCP.  His typical blood pressure will be in the 90s systolic.  He says this began with Entresto.  HPI     Home Medications Prior to Admission medications   Medication Sig Start Date End Date Taking? Authorizing Provider  acetaminophen (TYLENOL) 500 MG tablet Take 1,000 mg by mouth every 6 (six) hours as needed for moderate pain.   Yes [provider]  Ascorbic Acid (VITAMIN C) 1000 MG tablet Take 1,000 mg by mouth daily.   Yes [provider]  atorvastatin (LIPITOR) 20 MG tablet Take 20 mg by mouth daily.   Yes [provider]  cholecalciferol (VITAMIN D3) 25 MCG (1000 UNIT) tablet Take 1,000 Units by mouth daily.   Yes [provider]  dapagliflozin propanediol (FARXIGA) 10 MG TABS tablet Take 10 mg by mouth daily.   Yes [provider]  ezetimibe (ZETIA) 10 MG tablet Take 1 tablet (10 mg total) by mouth daily. 06/13/22  Yes Yates Decamp, MD  folic acid (FOLVITE) 1 MG tablet Take 1 mg by mouth daily.   Yes [provider]  gabapentin (NEURONTIN) 100 MG capsule Take 100 mg by mouth 3 (three) times daily.   Yes [provider]  glipiZIDE (GLUCOTROL XL) 2.5 MG 24 hr tablet Take 5 mg by mouth daily with breakfast. 02/23/19  Yes [provider]  HYDROcodone-acetaminophen (NORCO/VICODIN) 5-325 MG tablet Take 1 tablet by mouth every 6 (six) hours as needed for moderate pain (pain score 4-6).   Yes [provider]  Insulin Degludec (TRESIBA FLEXTOUCH [place]) Inject 60 Units into the skin daily at 2 PM.   Yes [provider]  metFORMIN (GLUCOPHAGE) 500 MG tablet Take 500 mg by mouth 2 (two) times daily with a meal.   Yes [provider]  methotrexate (RHEUMATREX) 2.5 MG tablet Take 15 mg by mouth once a week. 12/02/22  Yes [provider]  metoprolol succinate (TOPROL-XL) 50 MG 24 hr tablet Take 1 tablet (50 mg total)  by mouth daily. 09/09/21  Yes Yates Decamp, MD  Multiple Vitamin (MULTIVITAMIN WITH MINERALS) TABS tablet Take 1 tablet by mouth daily.   Yes [provider]  Omega-3 Fatty Acids (FISH OIL) 1000 MG CAPS Take 1,000 mg by mouth daily.   Yes [provider]  rivaroxaban (XARELTO) 20 MG TABS tablet Take 1 tablet (20 mg total) by mouth daily. 08/04/13 last dose of xarelto in preparation for colonoscopy 08/28/21  Yes Yates Decamp, MD  sacubitril-valsartan (ENTRESTO) 97-103 MG Take 1 tablet by mouth 2 (two) times daily. 08/06/22  Yes Yates Decamp, MD  Secukinumab (COSENTYX IV) Inject into the vein.   Yes [provider]  augmented  betamethasone dipropionate (DIPROLENE-AF) 0.05 % cream Apply 1 Application topically in the morning and at bedtime. 03/07/22   [provider]      Allergies    Ace inhibitors and Lisinopril    Review of Systems   Review of Systems  Physical Exam Updated Vital Signs BP 122/67   Pulse (!) 58   Temp 98 F (36.7 C) (Oral)   Resp 17   Ht 5\' 11"  (1.803 m)   Wt 120 kg   SpO2 96%   BMI 36.90 kg/m  Physical Exam Constitutional:      General: He is not in acute distress. HENT:     Head: Normocephalic and atraumatic.  Eyes:     Conjunctiva/sclera: Conjunctivae normal.     Pupils: Pupils are equal, round, and reactive to light.  Cardiovascular:     Rate and Rhythm: Normal rate and regular rhythm.     Comments: Capillary refills and toe ~2 seconds, faint pedal pulse Pulmonary:     Effort: Pulmonary effort is normal. No respiratory distress.  Abdominal:     General: There is no distension.     Tenderness: There is no abdominal tenderness.  Musculoskeletal:     Comments: Erythema and edema of the right lower extremity distal to the right knee, with scabbed ulceration of the right great toe and surrounding erythema  Skin:    General: Skin is warm and dry.  Neurological:     General: No focal deficit present.     Mental Status: He is alert. Mental status is at baseline.  Psychiatric:        Mood and Affect: Mood normal.        Behavior: Behavior normal.     ED Results / Procedures / Treatments   Labs (all labs ordered are listed, but only abnormal results are displayed) Labs Reviewed  BASIC METABOLIC PANEL - Abnormal; Notable for the following components:      Result Value   CO2 20 (*)    Glucose, Bld 178 (*)    BUN 29 (*)    Creatinine, Ser 1.25 (*)    All other components within normal limits  CBC - Abnormal; Notable for the following components:   Hemoglobin 12.9 (*)    All other components within normal limits  SEDIMENTATION RATE - Abnormal; Notable for the  following components:   Sed Rate 50 (*)    All other components within normal limits  HEMOGLOBIN A1C - Abnormal; Notable for the following components:   Hgb A1c MFr Bld 6.0 (*)    All other components within normal limits  C-REACTIVE PROTEIN  URINALYSIS, ROUTINE W REFLEX MICROSCOPIC    EKG EKG Interpretation Date/Time:  Sunday September 27 2023 07:08:48 EST Ventricular Rate:  91 PR Interval:  218 QRS Duration:  160 QT  Interval:  410 QTC Calculation: 504 R Axis:   23  Text Interpretation: Sinus rhythm with 1st degree A-V block Left bundle branch block Abnormal ECG When compared with ECG of 13-Aug-2023 10:45, PREVIOUS ECG IS PRESENT No significant changes Confirmed by Alvester Chou [phone]) on 09/27/2023 10:08:16 AM  Radiology DG Foot Complete Right Result Date: 09/27/2023 CLINICAL DATA:  Right foot infection EXAM: RIGHT FOOT COMPLETE - 3+ VIEW COMPARISON:  None Available. FINDINGS: Subtle wound or ulceration along the medial aspect of the great toe at the level of the IP joint. No underlying cortical erosion or periosteal elevation. No acute fracture or dislocation. Moderate degenerative changes, most pronounced at the first MTP joint. Prominent bidirectional calcaneal enthesophytes. Atherosclerotic vascular calcifications. IMPRESSION: Subtle wound or ulceration along the medial aspect of the great toe at the level of the IP joint. No radiographic evidence of osteomyelitis. Electronically Signed   By: Duanne Guess D.O.   On: 09/27/2023 11:00   DG Chest 2 View Result Date: 09/27/2023 CLINICAL DATA:  Shortness of breath EXAM: CHEST - 2 VIEW COMPARISON:  05/17/2009 FINDINGS: Normal heart size and mediastinal contours. No acute infiltrate or edema. No effusion or pneumothorax. Diffuse thoracic ankylosis and fusion. IMPRESSION: No active cardiopulmonary disease. Electronically Signed   By: Tiburcio Pea M.D.   On: 09/27/2023 07:54   US Venous Img Lower Unilateral Right (DVT) Result Date:  09/25/2023 CLINICAL DATA:  Right calf pain and edema. EXAM: RIGHT LOWER EXTREMITY VENOUS DOPPLER ULTRASOUND TECHNIQUE: Gray-scale sonography with graded compression, as well as color Doppler and duplex ultrasound were performed to evaluate the lower extremity deep venous systems from the level of the common femoral vein and including the common femoral, femoral, profunda femoral, popliteal and calf veins including the posterior tibial, peroneal and gastrocnemius veins when visible. The superficial great saphenous vein was also interrogated. Spectral Doppler was utilized to evaluate flow at rest and with distal augmentation maneuvers in the common femoral, femoral and popliteal veins. COMPARISON:  None Available. FINDINGS: Contralateral Common Femoral Vein: Respiratory phasicity is normal and symmetric with the symptomatic side. No evidence of thrombus. Normal compressibility. Common Femoral Vein: No evidence of thrombus. Normal compressibility, respiratory phasicity and response to augmentation. Saphenofemoral Junction: No evidence of thrombus. Normal compressibility and flow on color Doppler imaging. Profunda Femoral Vein: No evidence of thrombus. Normal compressibility and flow on color Doppler imaging. Femoral Vein: No evidence of thrombus. Normal compressibility, respiratory phasicity and response to augmentation. Popliteal Vein: No evidence of thrombus. Normal compressibility, respiratory phasicity and response to augmentation. Calf Veins: No evidence of thrombus. Normal compressibility and flow on color Doppler imaging. Superficial Great Saphenous Vein: No evidence of thrombus. Normal compressibility. Venous Reflux:  None. Other Findings: No evidence of superficial thrombophlebitis or abnormal fluid collection. IMPRESSION: No evidence of deep venous thrombosis. Electronically Signed   By: Irish Lack M.D.   On: 09/25/2023 16:22    Procedures Procedures    Medications Ordered in ED Medications   sodium chloride flush (NS) 0.9 % injection 3 mL (0 mLs Intravenous Hold 09/27/23 1300)  cefTRIAXone (ROCEPHIN) 2 g in sodium chloride 0.9 % 100 mL IVPB (0 g Intravenous Stopped 09/27/23 1421)  acetaminophen (TYLENOL) tablet 650 mg (has no administration in time range)    Or  acetaminophen (TYLENOL) suppository 650 mg (has no administration in time range)  ondansetron (ZOFRAN) tablet 4 mg (has no administration in time range)    Or  ondansetron (ZOFRAN) injection 4 mg (has no administration in time  range)  albuterol (PROVENTIL) (2.5 MG/3ML) 0.083% nebulizer solution 2.5 mg (has no administration in time range)  HYDROcodone-acetaminophen (NORCO/VICODIN) 5-325 MG per tablet 1-2 tablet (has no administration in time range)  atorvastatin (LIPITOR) tablet 20 mg (has no administration in time range)  ezetimibe (ZETIA) tablet 10 mg (has no administration in time range)  metoprolol succinate (TOPROL-XL) 24 hr tablet 50 mg (has no administration in time range)  sacubitril-valsartan (ENTRESTO) 97-103 mg per tablet (has no administration in time range)  dapagliflozin propanediol (FARXIGA) tablet 10 mg (has no administration in time range)  folic acid (FOLVITE) tablet 1 mg (1 mg Oral Not Given 09/27/23 1447)  rivaroxaban (XARELTO) tablet 20 mg (has no administration in time range)  ascorbic acid (VITAMIN C) tablet 1,000 mg (1,000 mg Oral Not Given 09/27/23 1446)  gabapentin (NEURONTIN) capsule 100 mg (has no administration in time range)  augmented betamethasone dipropionate (DIPROLENE-AF) 0.05 % cream 1 Application (has no administration in time range)  insulin glargine-yfgn (SEMGLEE) injection 45 Units (has no administration in time range)  insulin aspart (novoLOG) injection 0-6 Units (has no administration in time range)    ED Course/ Medical Decision Making/ A&P Clinical Course as of 09/27/23 1541  Sun Sep 27, 2023  1141 Patient admitted to hospitalist [MT]    Clinical Course User Index [MT] Renaye Rakers  Kermit Balo, MD                                 Medical Decision Making Amount and/or Complexity of Data Reviewed Labs: ordered. Radiology: ordered.  Risk Decision regarding hospitalization.   This patient presents to the ED with concern for redness, warmth and swelling of the right lower extremity. This involves an extensive number of treatment options, and is a complaint that carries with it a high risk of complications and morbidity.  The differential diagnosis includes cellulitis versus vascular disease versus osteomyelitis versus other  Co-morbidities that complicate the patient evaluation: Patient reports a history of peripheral arterial disease and clinically does appear to have signs of this, likely complicates and inhibits healing.  History of diabetes at higher risk of neuropathy and poor wound healing  External records from outside source obtained and reviewed including DVT ultrasound of right lower extremity performed 2 days ago in January 31 with no acute DVT noted  I ordered and personally interpreted labs.  The pertinent results include: White blood cell count within normal limits  I ordered imaging studies including the chest, x-ray of the foot I independently visualized and interpreted imaging which showed no emergent findings intrathoracic, no clear evidence of osteomyelitis I agree with the radiologist interpretation  The patient was maintained on a cardiac monitor.  I personally viewed and interpreted the cardiac monitored which showed an underlying rhythm of: Sinus rhythm  Per my interpretation the patient's ECG shows no acute ischemic findings  The patient has now been on doxycycline for approximate 24 hours as well as getting IM Rocephin.  Although he has not been on a full 48-hour treatments to clearly stipulate failure of outpatient therapy, he does report that the pain has worsened.  Redness spread has arrested, but not improved.  We discussed the options of  observation hospitalization on continued antibiotics versus continued home management, and agreed that the safer option would be observation at this point, given his history of diabetes and peripheral serial disease, high risk of poor wound healing.  I do not see  clear evidence of osteomyelitis, or sepsis.  I discussed the case with the hospitalist and we have opted to give the patient IV Rocephin for infection treatment at this time.  I ordered medication including vancomycin for cellulitis.  I have reviewed the patients home medicines and have made adjustments as needed  Test Considered: Low suspicion for necrotizing fasciitis, or cerulea dolens.  No indication for emergent CT angiogram at this time.  After the interventions noted above, I reevaluated the patient and found that they have: stayed the same   Dispostion:  After consideration of the diagnostic results and the patients response to treatment, I feel that the patent would benefit from medical admission.         Final Clinical Impression(s) / ED Diagnoses Final diagnoses:  Cellulitis of right lower extremity    Rx / DC Orders ED Discharge Orders     None         Terald Sleeper, MD 09/27/23 [phone]

## 2023-09-27 NOTE — H&P (Addendum)
History and Physical    Patient: Anthony Case OZD:[id number] DOB: Mar 13, 1951 DOA: 09/27/2023 DOS: the patient was seen and examined on 09/27/2023 PCP: Garlan Fillers, MD  Patient coming from: Home  Chief Complaint:  Chief Complaint  Patient presents with   Leg Pain   HPI: Anthony Case is a 73 y.o. male with medical history significant of hypertension, heart failure with mildly reduced EF, diabetes mellitus type 2, spontaneous DVT in 2009,  pulmonary embolism in 2013 on chronic anticoagulation, OSA  not on CPAP, presents with swelling, redness, and pain in right foot following a blister.  The symptoms began approximately four to five days ago and have progressively worsened, with significant pain radiating up the leg. The pain intensifies when standing, making it difficult to support weight. The foot has become twice its normal size, with increased redness and warmth. No fevers or chills are present, but did have some nausea and vomiting at the beginning of this week.  He developed a blister on one toe during a one-month cross-country trip. Despite attempts at self-care, the blister led to a ulcer.  He visited his doctor on 2 days ago, who ordered a Doppler ultrasound to rule out deep vein thrombosis, which was negative. He began taking doxycycline yesterday morning and received an injection of Rocephin at the office yesterday.  He has a history of type 2 diabetes, with blood sugar levels managed without insulin, and recent readings around 91 mg/dL. He has previously required extensive wound care for a different toe injury. His current medications include Tresiba, hydrocodone for pain, glipizide, and either [place] or [place].  He is also managing psoriatic arthritis with methotrexate and Cosentyx.  In the emergency department patient was noted to be afebrile with blood pressure 88/54-109/61, and all other vital signs maintained.  Labs significant for white blood cell count  6.9, hemoglobin 12.9, BUN 29, creatinine 1.25, and glucose 178.  Chest x-ray noted no acute abnormality.  X-rays of the right foot noted subtle wound/ulceration along the medial aspect of the great toe at the level of the IP joint with no clear findings concerning for osteomyelitis.  Review of Systems: As mentioned in the history of present illness. All other systems reviewed and are negative. Past Medical History:  Diagnosis Date   Arthritis    Diabetes mellitus    Hx pulmonary embolism 08/26/2007   required emergent tPA treatment   Hypertension    Shortness of breath    Sleep apnea    Past Surgical History:  Procedure Laterality Date   BACK SURGERY     COLONOSCOPY  2009   adenoma polyp   CYSTOSCOPY/URETEROSCOPY/HOLMIUM LASER/STENT PLACEMENT Left 07/02/2021   Procedure: CYSTOSCOPY/RETROGRADE/URETEROSCOPY/HOLMIUM LASER/STENT PLACEMENT;  Surgeon: Jerilee Field, MD;  Location: [facility];  Service: Urology;  Laterality: Left;   ELBOW ARTHROSCOPY  2010   IR RADIOLOGIST EVAL & MGMT  12/25/2020   IR RADIOLOGIST EVAL & MGMT  12/28/2020   LUMBAR PERCUTANEOUS PEDICLE SCREW 3 LEVEL N/A 05/25/2013   Procedure: Posterior Percutaneous Fixation from Thoracic nine to Lumbar one vertebrae with arthrodesis of Thoracic eleven Fracture;  Surgeon: Barnett Abu, MD;  Location: [facility];  Service: Neurosurgery;  Laterality: N/A;  Posterior Percutaneous Fixation from Thoracic nine to Lumbar one vertebrae with arthrodesis of Thoracic eleven Fracture   RIGHT/LEFT HEART CATH AND CORONARY ANGIOGRAPHY N/A 08/27/2021   Procedure: RIGHT/LEFT HEART CATH AND CORONARY ANGIOGRAPHY;  Surgeon: Yates Decamp, MD;  Location: [facility];  Service: Cardiovascular;  Laterality: N/A;   Social History:  reports that he has never smoked. He has never used smokeless tobacco. He reports that he does not currently use alcohol. He reports that he does not use drugs.  Allergies  Allergen Reactions   Ace Inhibitors Other (See  Comments)   Lisinopril Cough    Family History  Problem Relation Age of Onset   Heart disease Mother    Schizophrenia Mother    Heart disease Father    Diabetes Father    Heart disease Brother     Prior to Admission medications   Medication Sig Start Date End Date Taking? Authorizing Provider  acetaminophen (TYLENOL) 500 MG tablet Take 1,000 mg by mouth every 6 (six) hours as needed for moderate pain.    [provider]  Ascorbic Acid (VITAMIN C) 1000 MG tablet Take 1,000 mg by mouth daily.    [provider]  atorvastatin (LIPITOR) 20 MG tablet Take 20 mg by mouth daily.    [provider]  augmented betamethasone dipropionate (DIPROLENE-AF) 0.05 % cream Apply 1 Application topically in the morning and at bedtime. 03/07/22   [provider]  cholecalciferol (VITAMIN D3) 25 MCG (1000 UNIT) tablet Take 1,000 Units by mouth daily.    [provider]  dapagliflozin propanediol (FARXIGA) 10 MG TABS tablet Take 10 mg by mouth daily.    [provider]  ezetimibe (ZETIA) 10 MG tablet Take 1 tablet (10 mg total) by mouth daily. 06/13/22   Yates Decamp, MD  glipiZIDE (GLUCOTROL XL) 2.5 MG 24 hr tablet Take 5 mg by mouth daily with breakfast. 02/23/19   [provider]  HYDROcodone-acetaminophen (NORCO/VICODIN) 5-325 MG tablet Take 1 tablet by mouth every 6 (six) hours as needed for moderate pain (pain score 4-6).    [provider]  Insulin Degludec (TRESIBA FLEXTOUCH [place]) Inject 60 Units into the skin daily at 2 PM.    [provider]  metFORMIN (GLUCOPHAGE) 500 MG tablet Take 500 mg by mouth 2 (two) times daily with a meal.    [provider]  methotrexate (RHEUMATREX) 2.5 MG tablet Take 15 mg by mouth once a week. 12/02/22   [provider]  metoprolol succinate (TOPROL-XL) 50 MG 24 hr tablet Take 1 tablet (50 mg total) by mouth daily. 09/09/21   Yates Decamp, MD  Multiple Vitamin (MULTIVITAMIN WITH  MINERALS) TABS tablet Take 1 tablet by mouth daily.    [provider]  Omega-3 Fatty Acids (FISH OIL) 1000 MG CAPS Take 1,000 mg by mouth daily.    [provider]  rivaroxaban (XARELTO) 20 MG TABS tablet Take 1 tablet (20 mg total) by mouth daily. 08/04/13 last dose of xarelto in preparation for colonoscopy 08/28/21   Yates Decamp, MD  sacubitril-valsartan (ENTRESTO) 97-103 MG Take 1 tablet by mouth 2 (two) times daily. 08/06/22   Yates Decamp, MD  Secukinumab (COSENTYX IV) Inject into the vein.    [provider]  Vitamin D, Ergocalciferol, 50000 units CAPS Take 1 capsule by mouth once a week Oral 05/29/19   [provider]    Physical Exam: Vitals:   09/27/23 0927 09/27/23 0928 09/27/23 1045 09/27/23 1100  BP: (!) 89/51 (!) 88/54 99/60 109/61  Pulse: 73  78 72  Resp: 16  20 17   Temp: 97.7 F (36.5 C)     TempSrc: Oral     SpO2: 96%  97% 98%  Weight:      Height:  Constitutional: Elderly male currently NAD, calm, comfortable Eyes: PERRL, lids and conjunctivae normal ENMT: Mucous membranes are moist.  Normal dentition.  Neck: normal, supple, no masses  Respiratory: clear to auscultation bilaterally, no wheezing, no crackles. Normal respiratory effort. No accessory muscle use.  Cardiovascular: Regular rate and rhythm, no murmurs / rubs / gallops.  Swelling present on the right lower extremity.  Abdomen: no tenderness, no masses palpated.   Bowel sounds positive.  Musculoskeletal: no clubbing / cyanosis. No joint deformity upper and lower extremities. Good ROM, no contractures. Normal muscle tone.  Skin: Ulcer in medial aspect of the right great toe with erythema tracking up to the knee Neurologic: CN 2-12 grossly intact.  Strength 5/5 in all 4.  Psychiatric: Normal judgment and insight. Alert and oriented x 3. Normal mood.   Data Reviewed:  EKG reveals a sinus rhythm at 91 bpm with first-degree AV heart block.  Reviewed labs, imaging, and  pertinent records as documented.  Assessment and Plan:  Cellulitis of the right foot secondary to Diabetic foot ulcer Patient recently went on a cross-country trip at the beginning of last month where he thinks he likely developed a blister on the medial aspect of his right great toe.  Over the last 4 days reported swelling, redness, and significant pain  radiating up the leg, and difficulty supporting weight on the affected.  Doppler ultrasound ordered by his PCP 2 days ago was negative for DVT. Started on doxycycline and received a IM Rocephin.  No fevers or chills reported.  He follows with [facility] and for his diabetic foot exams. -Admit to a medical telemetry bed -Add-on ESR and CRP -Check ABI with TBI.  May warrant vascular consult or ambulatory referral depending on severity of findings -Empiric antibiotics of Rocephin -Hydrocodone as needed for pain -Wound care consult  Diabetes mellitus type 2 with peripheral neuropathy On admission glucose noted to be 178.  Last available hemoglobin A1c was 7.4 back in 2022.  Home medication regimen includes glipizide, metformin, and Tresiba 60 units nightly -Hypoglycemic protocols -Check hemoglobin A1c -Hold metformin and glipizide -Continue Farxiga and gabapentin -Pharmacy substitution of Semglee reduced to 45 units nightly -CBGs before every meal with sensitive SSI -Adjust insulin regimen as needed  Essential hypertension Blood pressures initially noted to be as low as 88/54.  -Continue metoprolol and Entresto as tolerated  Heart failure with mildly reduced EF Chronic.  Patient does not appear acutely fluid overloaded at this time.  Last echocardiogram noted EF to be 40 to 45% with grade 1 diastolic dysfunction last checked on 08/11/2023. -Daily weights  Normocytic anemia Chronic.  On admission hemoglobin 12.9 which appears relatively within patient's baseline which ranges from 12-15. -Recheck CBC tomorrow morning  Renal  insufficiency Acute.  On admission creatinine noted to be 1.25 with BUN 29.  Baseline creatinine appears to be around 1-1.1. -Check urinalysis -Continue to monitor kidney function  History of DVT History of pulmonary embolism Patient with prior spontaneous DVT in 2009 and subsequent pulmonary embolism in 2013. -Continue Xarelto  Psoriatic arthritis Patient is on Cosentyx in outpatient setting.  Hyperlipidemia -Continue atorvastatin, and Zetia  Obesity OSA on CPAP BMI 36.9 kg/m.  Patient we will have wife bring his home CPAP machine. -Continue outpatient follow-up with PCP  DVT prophylaxis: Xarelto Advance Care Planning:   Code Status: Full Code   Consults: None  Family Communication: Asked patient if he wanted me to update his wife and he stated that he would  do so.  Severity of Illness: The appropriate patient status for this patient is INPATIENT. Inpatient status is judged to be reasonable and necessary in order to provide the required intensity of service to ensure the patient's safety. The patient's presenting symptoms, physical exam findings, and initial radiographic and laboratory data in the context of their chronic comorbidities is felt to place them at high risk for further clinical deterioration. Furthermore, it is not anticipated that the patient will be medically stable for discharge from the hospital within 2 midnights of admission.   * I certify that at the point of admission it is my clinical judgment that the patient will require inpatient hospital care spanning beyond 2 midnights from the point of admission due to high intensity of service, high risk for further deterioration and high frequency of surveillance required.*  Author: Clydie Braun, MD 09/27/2023 11:38 AM  For on call review [email or web address].

## 2023-09-27 NOTE — Progress Notes (Signed)
BG recheck 103

## 2023-09-27 NOTE — ED Triage Notes (Addendum)
Pt states he has recently been on a long distance trip and started having a sore on his right great toe. Pt c/o right leg pain. Pt saw PCP 2 days ago, was treated with doxycycline and had a doppler study 2 days ago which was negative for DVT. Pt was told to come to ED if leg is more red and more painful. Pt states pain, swelling, and redness is now up to knee. Foot is hot to touch, has neuropathy. Cap refill <3sec. Pt has hx of DVT and PE in past, take xarelto. Pt has had increased SOB throughout trip.

## 2023-09-27 NOTE — ED Provider Triage Note (Signed)
Emergency Medicine Provider Triage Evaluation Note  Anthony Case , a 73 y.o. male  was evaluated in triage.  Pt complains of right leg pain.  Patient endorses that he was hiking and developed a blister on his medial right big toe about 8 to 10 days ago.  Since then the wound has become an ulceration and is now has associated redness, swelling, and warmth to touch of the foot, ankle, calf, and knee.  Patient was given a shot of Rocephin by his PCP on Friday.  Started on doxycycline.  He did not start the oral antibiotic until yesterday.  Endorses worsening pain and redness since Friday.  Review of Systems  Positive: Erythema, edema, warmth to touch of right lower extremity Negative: Fevers, chest pain, shortness of breath  Physical Exam  BP (!) 88/54   Pulse 73   Temp 97.7 F (36.5 C) (Oral)   Resp 16   Ht 5\' 11"  (1.803 m)   Wt 120 kg   SpO2 96%   BMI 36.90 kg/m  Gen:   Awake, no distress   Resp:  Normal effort  MSK:   Moves extremities without difficulty  Other:    Medical Decision Making  Medically screening exam initiated at 9:31 AM.  Appropriate orders placed.  Philbert Riser was informed that the remainder of the evaluation will be completed by another provider, this initial triage assessment does not replace that evaluation, and the importance of remaining in the ED until their evaluation is complete.    Maxwell Marion, PA-C 09/27/23 [phone]

## 2023-09-27 NOTE — Progress Notes (Signed)
BG 56, patient monitors blood sugar on his phone via dexcom continuous glucose monitor, his reads 68. Orange juice given. Patient reports feeling well.

## 2023-09-27 NOTE — ED Provider Triage Note (Signed)
Emergency Medicine Provider Triage Evaluation Note  Anthony Case , a 73 y.o. male  was evaluated in triage.  Pt complains of right leg pain.  Patient reports that he was hiking in [place] when he developed a blister on his medial right big toe.  Since then the blister has worsened and now looks like an ulceration.  He has a history of diabetic neuropathy.  He has increased pain, swelling, redness, and warmth to touch of the toe as well as foot, ankle, calf, and knee now as well.  He saw his primary care on Friday and had a negative DVT study.  She gave him a shot of Rocephin and gave him doxycycline, she started yesterday.  Review of Systems  Positive: Right foot, ankle, and leg pain Negative: Fevers   Physical Exam  BP (!) 88/54   Pulse 73   Temp 97.7 F (36.5 C) (Oral)   Resp 16   Ht 5\' 11"  (1.803 m)   Wt 120 kg   SpO2 96%   BMI 36.90 kg/m  Gen:   Awake, no distress   Resp:  Normal effort  MSK:   Moves extremities without difficulty  Other:  Ulceration present at right medial big toe  Medical Decision Making  Medically screening exam initiated at 9:35 AM.  Appropriate orders placed.  Philbert Riser was informed that the remainder of the evaluation will be completed by another provider, this initial triage assessment does not replace that evaluation, and the importance of remaining in the ED until their evaluation is complete.  Had a negative DVT study done with PCP 2 days ago.   Maxwell Marion, PA-C 09/27/23 1048

## 2023-09-27 NOTE — Consult Note (Signed)
WOC Nurse Consult Note: Reason for Consult: right great toe ulcerations Injury after vacationing, worsening pain and redness. History of DM/CHF Xray normal, no MRI  Wound type: trauma in the presence of diabetic neuropathy  Pressure Injury POA: NA Measurement: see nursing flow sheet Wound bed:100% dark, scabbed tissue, not truly eschar Drainage (amount, consistency, odor) none Periwound: redness  Dressing procedure/placement/frequency: Paint area with betadine, cover with foam if patient desires.   FU with podiatrist outpatient   Re consult if needed, will not follow at this time. Thanks  Regla Fitzgibbon [employer], RN,CWOCN, CNS, CWON-AP [phone])

## 2023-09-28 ENCOUNTER — Encounter (HOSPITAL_COMMUNITY): Payer: Medicare Other

## 2023-09-28 DIAGNOSIS — E1140 Type 2 diabetes mellitus with diabetic neuropathy, unspecified: Secondary | ICD-10-CM | POA: Diagnosis not present

## 2023-09-28 DIAGNOSIS — E669 Obesity, unspecified: Secondary | ICD-10-CM | POA: Diagnosis not present

## 2023-09-28 DIAGNOSIS — N289 Disorder of kidney and ureter, unspecified: Secondary | ICD-10-CM | POA: Diagnosis not present

## 2023-09-28 DIAGNOSIS — L03115 Cellulitis of right lower limb: Secondary | ICD-10-CM | POA: Diagnosis not present

## 2023-09-28 DIAGNOSIS — Z794 Long term (current) use of insulin: Secondary | ICD-10-CM | POA: Diagnosis not present

## 2023-09-28 DIAGNOSIS — I10 Essential (primary) hypertension: Secondary | ICD-10-CM | POA: Diagnosis not present

## 2023-09-28 DIAGNOSIS — D649 Anemia, unspecified: Secondary | ICD-10-CM | POA: Diagnosis not present

## 2023-09-28 DIAGNOSIS — L4050 Arthropathic psoriasis, unspecified: Secondary | ICD-10-CM | POA: Diagnosis not present

## 2023-09-28 DIAGNOSIS — Z86711 Personal history of pulmonary embolism: Secondary | ICD-10-CM | POA: Diagnosis not present

## 2023-09-28 DIAGNOSIS — E11621 Type 2 diabetes mellitus with foot ulcer: Secondary | ICD-10-CM | POA: Diagnosis not present

## 2023-09-28 DIAGNOSIS — I5022 Chronic systolic (congestive) heart failure: Secondary | ICD-10-CM | POA: Diagnosis not present

## 2023-09-28 DIAGNOSIS — Z86718 Personal history of other venous thrombosis and embolism: Secondary | ICD-10-CM | POA: Diagnosis not present

## 2023-09-28 LAB — CBC
HCT: 38.5 % — ABNORMAL LOW (ref 39.0–52.0)
Hemoglobin: 12.9 g/dL — ABNORMAL LOW (ref 13.0–17.0)
MCH: 30.7 pg (ref 26.0–34.0)
MCHC: 33.5 g/dL (ref 30.0–36.0)
MCV: 91.7 fL (ref 80.0–100.0)
Platelets: 236 10*3/uL (ref 150–400)
RBC: 4.2 MIL/uL — ABNORMAL LOW (ref 4.22–5.81)
RDW: 14.7 % (ref 11.5–15.5)
WBC: 4.9 10*3/uL (ref 4.0–10.5)
nRBC: 0 % (ref 0.0–0.2)

## 2023-09-28 LAB — BASIC METABOLIC PANEL
Anion gap: 8 (ref 5–15)
BUN: 22 mg/dL (ref 8–23)
CO2: 22 mmol/L (ref 22–32)
Calcium: 9 mg/dL (ref 8.9–10.3)
Chloride: 109 mmol/L (ref 98–111)
Creatinine, Ser: 0.96 mg/dL (ref 0.61–1.24)
GFR, Estimated: >60 mL/min (ref >60)
Glucose, Bld: 75 mg/dL (ref 70–99)
Potassium: 3.8 mmol/L (ref 3.5–5.1)
Sodium: 139 mmol/L (ref 135–145)

## 2023-09-28 LAB — GLUCOSE, CAPILLARY
Glucose-Capillary: 76 mg/dL (ref 70–99)
Glucose-Capillary: 85 mg/dL (ref 70–99)
Glucose-Capillary: 132 mg/dL — ABNORMAL HIGH (ref 70–99)
Glucose-Capillary: 166 mg/dL — ABNORMAL HIGH (ref 70–99)

## 2023-09-28 MED ORDER — INSULIN GLARGINE-YFGN 100 UNIT/ML ~~LOC~~ SOLN
35.0000 [IU] | Freq: Every day | SUBCUTANEOUS | Status: DC
  Administered 2023-09-28: 35 [IU] via SUBCUTANEOUS
  Filled 2023-09-28 (×2): qty 0.35

## 2023-09-28 NOTE — Plan of Care (Signed)

## 2023-09-28 NOTE — TOC Initial Note (Signed)
Transition of Care (TOC) - Initial/Assessment Note   Spoke to patient at bedside. Patient from home with wife.   PCP Jarome Matin   Patient has a CPAP. Patient was using his wife's cane prior to admission.   Hospital nurse will provide patient wound care education prior to discharge .   Transition of Care Department [facility])  will continue to monitor patient advancement through interdisciplinary progression rounds. If new patient transition needs arise, please place a TOC consult.   Patient Details  Name: Anthony Case MRN: [id number] Date of Birth: [birth year]  Transition of Care [facility]) CM/SW Contact:    Kingsley Plan, RN Phone Number: 09/28/2023, 1:26 PM  Clinical Narrative:                   Expected Discharge Plan: Home/Self Care Barriers to Discharge: Continued Medical Work up   Patient Goals and CMS Choice Patient states their goals for this hospitalization and ongoing recovery are:: to return to home          Expected Discharge Plan and Services In-house Referral: NA Discharge Planning Services: CM Consult Post Acute Care Choice: NA Living arrangements for the past 2 months: Single Family Home                 DME Arranged: N/A DME Agency: NA       HH Arranged: NA HH Agency: NA        Prior Living Arrangements/Services Living arrangements for the past 2 months: Single Family Home Lives with:: Spouse Patient language and need for interpreter reviewed:: Yes Do you feel safe going back to the place where you live?: Yes      Need for Family Participation in Patient Care: Yes (Comment) Care giver support system in place?: Yes (comment)   Criminal Activity/Legal Involvement Pertinent to Current Situation/Hospitalization: No - Comment as needed  Activities of Daily Living   ADL Screening (condition at time of admission) Independently performs ADLs?: Yes (appropriate for developmental age) Is the patient deaf or have difficulty hearing?: No Does  the patient have difficulty seeing, even when wearing glasses/contacts?: Yes Does the patient have difficulty concentrating, remembering, or making decisions?: No  Permission Sought/Granted   Permission granted to share information with : No              Emotional Assessment Appearance:: Appears stated age Attitude/Demeanor/Rapport: Engaged Affect (typically observed): Accepting Orientation: : Oriented to Self, Oriented to Place, Oriented to  Time, Oriented to Situation Alcohol / Substance Use: Not Applicable Psych Involvement: No (comment)  Admission diagnosis:  Cellulitis [L03.90] Cellulitis of right lower extremity [L03.115] Patient Active Problem List   Diagnosis Date Noted   Cellulitis 09/27/2023   Normocytic anemia 09/27/2023   Renal insufficiency 09/27/2023   History of DVT (deep vein thrombosis) 09/27/2023   History of pulmonary embolism 09/27/2023   Psoriatic arthritis (HCC) 09/27/2023   Obesity (BMI 30-39.9) 09/27/2023   OSA on CPAP 09/27/2023   Dependence on CPAP ventilation 07/10/2022   Ankylosing spondylitis of thoracic region (HCC) 04/23/2022   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   Dilated cardiomyopathy (HCC) 04/23/2022   Acute on chronic systolic congestive heart failure (HCC) 04/23/2022   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) 08/27/2021   LBBB (left bundle branch block) 08/27/2021   Non-ischemic cardiomyopathy (HCC) 08/27/2021   Diabetic foot ulcer (HCC) 12/04/2020   Laceration of left great toe 08/03/2020  Pain due to onychomycosis of toenail of left foot 04/05/2019   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) 04/05/2019   History of adenomatous polyp of colon 08/05/2013   Thoracic spine fracture (HCC) 05/23/2013   Pulmonary embolism (HCC) 04/09/2012    Class: Acute   Left leg DVT (HCC) 04/09/2012    Class: Acute   Dyspnea on  exertion 04/07/2012    Class: Acute   Leg edema, left 04/07/2012    Class: Acute   DIABETES, TYPE 2 04/12/2008   Hyperlipidemia 04/12/2008   HYPERTENSION 04/12/2008   Severe obstructive sleep apnea-hypopnea syndrome 04/12/2008   PCP:  Garlan Fillers, MD Pharmacy:   [facility] [address] [place] - [address] [place] [address] Phone: [phone] Fax: [phone]     Social Drivers of Health (SDOH) Social History: SDOH Screenings   Food Insecurity: No Food Insecurity (09/27/2023)  Housing: Low Risk  (09/27/2023)  Transportation Needs: No Transportation Needs (09/27/2023)  Utilities: Not At Risk (09/27/2023)  Social Connections: Socially Integrated (09/27/2023)  Tobacco Use: Low Risk  (09/27/2023)   SDOH Interventions:     Readmission Risk Interventions     No data to display

## 2023-09-28 NOTE — Plan of Care (Signed)
  Problem: Pain Managment: Goal: General experience of comfort will improve and/or be controlled Outcome: Progressing   Problem: Safety: Goal: Ability to remain free from injury will improve Outcome: Progressing

## 2023-09-28 NOTE — Progress Notes (Signed)
PROGRESS NOTE  Anthony Case:[id number] DOB: 1951/05/11   PCP: Garlan Fillers, MD  Patient is from: Home.  Independently ambulates at baseline.  DOA: 09/27/2023 LOS: 1  Chief complaints Chief Complaint  Patient presents with   Leg Pain     Brief Narrative / Interim history: 73 year old M with PMH of HFmrEF, DM-2 with neuropathy, PE on Xarelto, PVD, HTN, psoriasis on methotrexate, OSA not on CPAP and obesity presenting with increased right great swelling, erythema and pain for about 4 to 5 days, and admitted with working diagnosis of right lower extremity cellulitis/infected diabetic ulcer.  Patient reports blister on his toe about a month ago after he did cross[place]country trip.  Reportedly had lower extremity venous Doppler that was negative 2 days prior to presentation. In ED, slightly hypotensive.  WBC 6.9.  X-ray of right foot showed septal subtle wound/ulceration along the medial aspect of the great toe at the level of the IP joint with no clear findings concerning for osteomyelitis.  Patient was started on ceftriaxone and admitted for further care.  ABI ordered.  Subjective: Seen and examined earlier this morning.  No major events overnight of this morning.  He reports improvement in redness, swelling and pain.  Objective: Vitals:   09/27/23 1943 09/28/23 0500 09/28/23 0526 09/28/23 0854  BP: (!) 150/72  135/86 (!) 136/57  Pulse: 64  (!) 57 (!) 57  Resp: 16  16 17   Temp: (!) 97.4 F (36.3 C)  (!) 97.5 F (36.4 C) 97.8 F (36.6 C)  TempSrc: Oral  Oral   SpO2: 99%  97% 97%  Weight:  115.9 kg    Height:        Examination:  GENERAL: No apparent distress.  Nontoxic. HEENT: MMM.  Vision and hearing grossly intact.  NECK: Supple.  No apparent JVD.  RESP:  No IWOB.  Fair aeration bilaterally. CVS:  RRR. Heart sounds normal.  ABD/GI/GU: BS+. Abd soft, NTND.  MSK/EXT:  Moves extremities. No apparent deformity.  Ulceration over plantar aspect of right great toe.   Erythema in RLE including right shin and dorsal aspect of right foot.  No increased warmth to touch.  1+ DP pulse in right. SKIN: As above. NEURO: Awake, alert and oriented appropriately.  No apparent focal neuro deficit. PSYCH: Calm. Normal affect.    Procedures:  None  Microbiology summarized: None  Assessment and plan: Cellulitis of the right foot secondary to diabetic foot ulcer: Presents with 4 to 5 days of worsening erythema, swelling and pain.  Reportedly had blister after he did contract for strep about a month prior.  No leukocytosis or constitutional symptoms.  Lower extremity venous Doppler negative on 1/31.  CRP elevated to 11.4.  ESR 50.  Immunocompromised patient.  Improving with IV antibiotics.  Patient has podiatrist outpatient. -Continue IV ceftriaxone -Follow ABI/TBI -Wound care. 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  Recent Labs  Lab 09/27/23 1636 09/27/23 1744 09/27/23 1947 09/28/23 0850  GLUCAP 56* 103* 66* 76  -Decrease basal insulin from 45-35 -Continue SSI-very sensitive -Continue home Jardiance and Lipitor. -Further adjustment as appropriate.    Hypotension/history of essential hypertension: Initially hypotensive to 88/54.  Now normotensive for most part. -Continue home Toprol-XL and Entresto.  Chronic HFmrEF: TTE on 12/17 with LVEF of 40 to 45%, G1 DD.  No cardiopulmonary symptoms.  Appears euvolemic.   -Monitor fluid and respiratory status -Continue home Toprol-XL, Jardiance and  Entresto   Normocytic anemia: Stable Recent Labs    09/27/23 0718 09/28/23 0521  HGB 12.9* 12.9*  -Continue monitoring  Renal insufficiency: Baseline Cr ~1.0-1.1 renal function improved. Recent Labs    09/27/23 0718 09/28/23 0521  BUN 29* 22  CREATININE 1.25* 0.96  -Continue monitoring  History of DVT/PE: LE venous Doppler negative for DVT on 1/31. -Continue home Xarelto     Psoriatic arthritis: Cosentyx and MTX outpatient.  -Hold home meds.   Hyperlipidemia -Continue atorvastatin, and Zetia   OSA not on CPAP   Morbid obesity: Elevated BMI with comorbidity including diabetes, heart failure, hypertension Body mass index is 35.64 kg/m. -Encourage lifestyle change to lose weight          DVT prophylaxis:   rivaroxaban (XARELTO) tablet 20 mg  Code Status: Full code Family Communication: None at bedside Level of care: Med-Surg Status is: Inpatient Remains inpatient appropriate because: Right lower extremity cellulitis due to diabetic foot ulcer   Final disposition: Home Consultants:  None  55 minutes with more than 50% spent in reviewing records, counseling patient/family and coordinating care.   Sch Meds:  Scheduled Meds:  vitamin C  1,000 mg Oral Daily   atorvastatin  20 mg Oral QPM   dapagliflozin propanediol  10 mg Oral Daily   ezetimibe  10 mg Oral Daily   folic acid  1 mg Oral Daily   gabapentin  100 mg Oral TID   insulin aspart  0-6 Units Subcutaneous TID WC   insulin glargine-yfgn  35 Units Subcutaneous QHS   metoprolol succinate  50 mg Oral Daily   rivaroxaban  20 mg Oral Daily   sacubitril-valsartan  1 tablet Oral BID   sodium chloride flush  3 mL Intravenous Q12H   Continuous Infusions:  cefTRIAXone (ROCEPHIN)  IV Stopped (09/27/23 1421)   PRN Meds:.acetaminophen **OR** acetaminophen, albuterol, fluocinonide cream, HYDROcodone-acetaminophen, ondansetron **OR** ondansetron (ZOFRAN) IV  Antimicrobials: Anti-infectives (From admission, onward)    Start     Dose/Rate Route Frequency Ordered Stop   09/27/23 1200  cefTRIAXone (ROCEPHIN) 2 g in sodium chloride 0.9 % 100 mL IVPB        2 g 200 mL/hr over 30 Minutes Intravenous Every 24 hours 09/27/23 1154 10/04/23 1159   09/27/23 1115  doxycycline (VIBRAMYCIN) 100 mg in sodium chloride 0.9 % 250 mL IVPB  Status:  Discontinued        100 mg 125 mL/hr over 120 Minutes  Intravenous  Once 09/27/23 1111 09/27/23 1111        I have personally reviewed the following labs and images: CBC: Recent Labs  Lab 09/27/23 0718 09/28/23 0521  WBC 6.9 4.9  HGB 12.9* 12.9*  HCT 39.5 38.5*  MCV 93.2 91.7  PLT 272 236   BMP &GFR Recent Labs  Lab 09/27/23 0718 09/28/23 0521  NA 137 139  K 3.9 3.8  CL 107 109  CO2 20* 22  GLUCOSE 178* 75  BUN 29* 22  CREATININE 1.25* 0.96  CALCIUM 9.1 9.0   Estimated Creatinine Clearance: 88.7 mL/min (by C-G formula based on SCr of 0.96 mg/dL). Liver & Pancreas: No results for input(s): "AST", "ALT", "ALKPHOS", "BILITOT", "PROT", "ALBUMIN" in the last 168 hours. No results for input(s): "LIPASE", "AMYLASE" in the last 168 hours. No results for input(s): "AMMONIA" in the last 168 hours. Diabetic: Recent Labs    09/27/23 0718  HGBA1C 6.0*   Recent Labs  Lab 09/27/23 1636 09/27/23 1744 09/27/23 1947 09/28/23 0850  GLUCAP  56* 103* 66* 76   Cardiac Enzymes: No results for input(s): "CKTOTAL", "CKMB", "CKMBINDEX", "TROPONINI" in the last 168 hours. No results for input(s): "PROBNP" in the last 8760 hours. Coagulation Profile: No results for input(s): "INR", "PROTIME" in the last 168 hours. Thyroid Function Tests: No results for input(s): "TSH", "T4TOTAL", "FREET4", "T3FREE", "THYROIDAB" in the last 72 hours. Lipid Profile: No results for input(s): "CHOL", "HDL", "LDLCALC", "TRIG", "CHOLHDL", "LDLDIRECT" in the last 72 hours. Anemia Panel: No results for input(s): "VITAMINB12", "FOLATE", "FERRITIN", "TIBC", "IRON", "RETICCTPCT" in the last 72 hours. Urine analysis:    Component Value Date/Time   COLORURINE AMBER (A) 09/27/2023 1738   APPEARANCEUR HAZY (A) 09/27/2023 1738   LABSPEC 1.027 09/27/2023 1738   PHURINE 5.0 09/27/2023 1738   GLUCOSEU >=500 (A) 09/27/2023 1738   HGBUR NEGATIVE 09/27/2023 1738   BILIRUBINUR NEGATIVE 09/27/2023 1738   KETONESUR NEGATIVE 09/27/2023 1738   PROTEINUR 30 (A)  09/27/2023 1738   UROBILINOGEN 0.2 04/04/2015 2047   NITRITE NEGATIVE 09/27/2023 1738   LEUKOCYTESUR NEGATIVE 09/27/2023 1738   Sepsis Labs: Invalid input(s): "PROCALCITONIN", "LACTICIDVEN"  Microbiology: No results found for this or any previous visit (from the past 240 hours).  Radiology Studies: DG Foot Complete Right Result Date: 09/27/2023 CLINICAL DATA:  Right foot infection EXAM: RIGHT FOOT COMPLETE - 3+ VIEW COMPARISON:  None Available. FINDINGS: Subtle wound or ulceration along the medial aspect of the great toe at the level of the IP joint. No underlying cortical erosion or periosteal elevation. No acute fracture or dislocation. Moderate degenerative changes, most pronounced at the first MTP joint. Prominent bidirectional calcaneal enthesophytes. Atherosclerotic vascular calcifications. IMPRESSION: Subtle wound or ulceration along the medial aspect of the great toe at the level of the IP joint. No radiographic evidence of osteomyelitis. Electronically Signed   By: Duanne Guess D.O.   On: 09/27/2023 11:00      Loyda Costin T. Acey [facility]  If 7PM-7AM, please contact night-coverage [email or web address] 09/28/2023, 10:25 AM

## 2023-09-29 ENCOUNTER — Inpatient Hospital Stay (HOSPITAL_COMMUNITY): Payer: Medicare Other

## 2023-09-29 DIAGNOSIS — I5022 Chronic systolic (congestive) heart failure: Secondary | ICD-10-CM | POA: Diagnosis not present

## 2023-09-29 DIAGNOSIS — Z86711 Personal history of pulmonary embolism: Secondary | ICD-10-CM | POA: Diagnosis not present

## 2023-09-29 DIAGNOSIS — E11621 Type 2 diabetes mellitus with foot ulcer: Secondary | ICD-10-CM | POA: Diagnosis not present

## 2023-09-29 DIAGNOSIS — Z86718 Personal history of other venous thrombosis and embolism: Secondary | ICD-10-CM | POA: Diagnosis not present

## 2023-09-29 DIAGNOSIS — L4050 Arthropathic psoriasis, unspecified: Secondary | ICD-10-CM | POA: Diagnosis not present

## 2023-09-29 DIAGNOSIS — L98499 Non-pressure chronic ulcer of skin of other sites with unspecified severity: Secondary | ICD-10-CM

## 2023-09-29 DIAGNOSIS — Z794 Long term (current) use of insulin: Secondary | ICD-10-CM | POA: Diagnosis not present

## 2023-09-29 DIAGNOSIS — I10 Essential (primary) hypertension: Secondary | ICD-10-CM | POA: Diagnosis not present

## 2023-09-29 DIAGNOSIS — N289 Disorder of kidney and ureter, unspecified: Secondary | ICD-10-CM | POA: Diagnosis not present

## 2023-09-29 DIAGNOSIS — L97519 Non-pressure chronic ulcer of other part of right foot with unspecified severity: Secondary | ICD-10-CM | POA: Diagnosis not present

## 2023-09-29 DIAGNOSIS — E1140 Type 2 diabetes mellitus with diabetic neuropathy, unspecified: Secondary | ICD-10-CM | POA: Diagnosis not present

## 2023-09-29 DIAGNOSIS — L03115 Cellulitis of right lower limb: Secondary | ICD-10-CM | POA: Diagnosis not present

## 2023-09-29 DIAGNOSIS — E669 Obesity, unspecified: Secondary | ICD-10-CM | POA: Diagnosis not present

## 2023-09-29 LAB — VAS US ABI WITH/WO TBI: Left ABI: 1.47

## 2023-09-29 LAB — CBC
HCT: 39.6 % (ref 39.0–52.0)
Hemoglobin: 13.1 g/dL (ref 13.0–17.0)
MCH: 30.3 pg (ref 26.0–34.0)
MCHC: 33.1 g/dL (ref 30.0–36.0)
MCV: 91.5 fL (ref 80.0–100.0)
Platelets: 270 10*3/uL (ref 150–400)
RBC: 4.33 MIL/uL (ref 4.22–5.81)
RDW: 14.3 % (ref 11.5–15.5)
WBC: 5 10*3/uL (ref 4.0–10.5)
nRBC: 0 % (ref 0.0–0.2)

## 2023-09-29 LAB — RENAL FUNCTION PANEL
Albumin: 2.6 g/dL — ABNORMAL LOW (ref 3.5–5.0)
Anion gap: 11 (ref 5–15)
BUN: 21 mg/dL (ref 8–23)
CO2: 22 mmol/L (ref 22–32)
Calcium: 9.2 mg/dL (ref 8.9–10.3)
Chloride: 105 mmol/L (ref 98–111)
Creatinine, Ser: 1.15 mg/dL (ref 0.61–1.24)
GFR, Estimated: >60 mL/min (ref >60)
Glucose, Bld: 148 mg/dL — ABNORMAL HIGH (ref 70–99)
Phosphorus: 3.7 mg/dL (ref 2.5–4.6)
Potassium: 3.7 mmol/L (ref 3.5–5.1)
Sodium: 138 mmol/L (ref 135–145)

## 2023-09-29 LAB — MAGNESIUM: Magnesium: 1.5 mg/dL — ABNORMAL LOW (ref 1.7–2.4)

## 2023-09-29 LAB — GLUCOSE, CAPILLARY
Glucose-Capillary: 150 mg/dL — ABNORMAL HIGH (ref 70–99)
Glucose-Capillary: 144 mg/dL — ABNORMAL HIGH (ref 70–99)

## 2023-09-29 MED ORDER — CEFADROXIL 500 MG PO CAPS: 1000.0000 mg | ORAL_CAPSULE | Freq: Two times a day (BID) | ORAL | 0 refills | Status: AC

## 2023-09-29 NOTE — Progress Notes (Signed)
Ankle-brachial index completed. Please see CV Procedures for preliminary results.  Shona Simpson, RVT 09/29/23 12:01 PM

## 2023-09-29 NOTE — Progress Notes (Signed)
   09/29/23 0610  Vital Signs  Temp  (PT refused vitals)

## 2023-09-29 NOTE — Progress Notes (Signed)
 Tanda KATHEE Axe to be D/C'd  per MD order.  Discussed with the patient and all questions fully answered.  VSS, Skin clean, dry and intact without evidence of skin break down, no evidence of skin tears noted.  IV catheter discontinued intact. Site without signs and symptoms of complications. Dressing and pressure applied.  An After Visit Summary was printed and given to the patient.  D/c education completed with patient/family including follow up instructions, medication list, d/c activities limitations if indicated, with other d/c instructions as indicated by MD - patient able to verbalize understanding, all questions fully answered.   Patient instructed to return to ED, call 911, or call MD for any changes in condition.   Patient to be escorted via WC, and D/C home via private auto.

## 2023-09-29 NOTE — Progress Notes (Signed)
 Mobility Specialist Progress Note:   09/29/23 1102  Mobility  Activity Ambulated with assistance in hallway  Level of Assistance  (MinG)  Assistive Device [employer] Ambulated (ft) 250 ft  Activity Response Tolerated well  Mobility Referral Yes  Mobility visit 1 Mobility  Mobility Specialist Start Time (ACUTE ONLY) 1050  Mobility Specialist Stop Time (ACUTE ONLY) 1100  Mobility Specialist Time Calculation (min) (ACUTE ONLY) 10 min   Pt received in bed, agreeable to mobility. SB to stand. MinG during ambulation for safety. Pt denied any discomfort during ambulation, asx throughout. Pt returned to bed with call bell in reach and all needs met.   Brown Husband  Mobility Specialist Please contact via [employer] office at [phone]

## 2023-09-29 NOTE — Discharge Summary (Signed)
 Physician Discharge Summary  Anthony Case FMW:[id number] DOB: 05/13/1951 DOA: 09/27/2023  PCP: Anthony Toribio MATSU, MD  Admit date: 09/27/2023 Discharge date: 09/29/23  Admitted From: Home Disposition: Home Recommendations for Outpatient Follow-up:  Outpatient follow-up with PCP and podiatry in 1 to 2 weeks Recommend referral to vascular surgery for PAD Check left great toe ulcer, CMP and CBC at follow-up Please follow up on the following pending results: None   Home Health: No need identified Equipment/Devices: No need identified  Discharge Condition: Stable CODE STATUS: Full code  Follow-up Information     Anthony Toribio MATSU, MD. Schedule an appointment as soon as possible for a visit in 1 week(s).   Specialty: Internal Medicine Contact information: [address] [place] [address] [phone]                 Hospital course 73 year old M with PMH of HFmrEF, DM-2 with neuropathy, PE on Xarelto , PVD, HTN, psoriasis on methotrexate, OSA not on CPAP and obesity presenting with increased right great swelling, erythema and pain for about 4 to 5 days, and admitted with working diagnosis of right lower extremity cellulitis/infected diabetic ulcer.  Patient reports blister on his toe about a month ago after he did cross[place]country trip.  Reportedly had lower extremity venous Doppler that was negative 2 days prior to presentation. In ED, slightly hypotensive.  WBC 6.9.  X-ray of right foot showed septal subtle wound/ulceration along the medial aspect of the great toe at the level of the IP joint with no clear findings concerning for osteomyelitis.  Patient was started on ceftriaxone  and admitted for further care.    On the day of discharge, significant improvement in cellulitis.  ABI noncompressible on the right and 1.47 on the left.  TBI 0.55 on the right and 0.62 on the left.  Overall, ABI unchanged from his previous evaluation in 2022.  He is already on statin and  anticoagulation.  Patient is discharged on p.o. cefadroxil  1 g 3 times daily for 6 more days.  Advised to hold methotrexate and Cosentyx  until he completes antibiotic course.  Outpatient follow-up with PCP and podiatry in 1 to 2 weeks.  Also advised to wear appropriate shoe every time he is on his feet.  He does not smoke.  Emphasized the importance of glycemic control  See individual problem list below for more.   Problems addressed during this hospitalization Cellulitis of the right foot secondary to diabetic foot ulcer: Presents with 4 to 5 days of worsening erythema, swelling and pain.  Reportedly had blister after he did contract for strep about a month prior.  No leukocytosis or constitutional symptoms.  Lower extremity venous Doppler negative on 1/31.  CRP elevated to 11.4.  ESR 50.  Immunocompromised patient.  Improving with IV ceftriaxone .  ABI as above. -Discharged on p.o. cefadroxil  1 g twice daily for 6 more days -Emphasized the importance of glycemic control -Continue Crestor and Xarelto  for PAD. -Outpatient follow-up with PCP and podiatry in 1 to 2 weeks -Counseled on the importance of properly fitting shoe  PAD: ABI noncompressible on the right and 1.47 on the left.  TBI 0.55 on the right and 0.62 on the left.  Overall, ABI unchanged from his previous evaluation in 2022.  Not a smoker.  -Continue statin and anticoagulation -Recommend referral to vascular surgery outpatient.   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  -Continue home meds understanding.     Hypotension/history of essential hypertension: Initially hypotensive  to 88/54.  Now normotensive for most part. -Continue home Toprol -XL and Entresto .   Chronic HFmrEF: TTE on 12/17 with LVEF of 40 to 45%, G1 DD.  No cardiopulmonary symptoms.  Appears euvolemic.   -Monitor fluid and respiratory status -Continue home Toprol -XL, Jardiance and Entresto    Normocytic anemia: Stable -Check CBC at follow-up.    Renal insufficiency: Baseline Cr ~1.0-1.1 renal function improved. -Return renal function in 1 to 2 weeks   History of DVT/PE: LE venous Doppler negative for DVT on 1/31. -Continue home Xarelto      Psoriatic arthritis: Cosentyx  and MTX outpatient.   -Advised to hold methotrexate and Cosentyx  until he completes antibiotic course.    Hyperlipidemia -Continue atorvastatin , and Zetia    OSA not on CPAP     Morbid obesity: Elevated BMI with comorbidity including diabetes, heart failure, hypertension Body mass index is 34.93 kg/m.             Time spent 35 minutes  Vital signs Vitals:   09/29/23 0500 09/29/23 0610 09/29/23 0935 09/29/23 0945  BP:   105/72 105/72  Pulse:   84 77  Temp:  Comment: PT refused vitals  98.1 F (36.7 C)  Resp:    18  Height:      Weight: 113.6 kg     SpO2:    98%  TempSrc:    Oral  BMI (Calculated): 34.95        Discharge exam  GENERAL: No apparent distress.  Nontoxic. HEENT: MMM.  Vision and hearing grossly intact.  NECK: Supple.  No apparent JVD.  RESP:  No IWOB.  Fair aeration bilaterally. CVS:  RRR. Heart sounds normal.  ABD/GI/GU: BS+. Abd soft, NTND.  MSK/EXT:  Moves extremities. No apparent deformity.  Ulceration over plantar aspect of right great toe.  Erythema in RLE including right shin and dorsal aspect of right foot improved..  No increased warmth to touch.  1+ DP pulse in right. SKIN: As above. NEURO: Awake, alert and oriented appropriately.  No apparent focal neuro deficit. PSYCH: Calm. Normal affect.   Discharge Instructions Discharge Instructions     Diet - low sodium heart healthy   Complete by: As directed    Diet Carb Modified   Complete by: As directed    Discharge instructions   Complete by: As directed    It has been a pleasure taking care of you!  You were hospitalized due to right foot ulcer and infection/cellulitis for which you have been started on antibiotics.  Your symptoms improved.  We are  discharging you more antibiotics to complete treatment course.  It is very important that you complete the whole course of antibiotics.  It is also very important that you have your blood sugar under good control.  Follow-up with your primary care doctor and podiatrist in 1 to 2 weeks or sooner if needed.  Always wear appropriate shoe when you are on your feet.   In regards to peripheral arterial disease/poor circulation in your leg, ABI did not show change when compared to your ABI in 2022.  Continue taking your Xarelto  and Crestor.  Your primary care doctor can give you referral to vascular surgery outpatient.   Take care,   Increase activity slowly   Complete by: As directed    No wound care   Complete by: As directed       Allergies as of 09/29/2023       Reactions   Ace Inhibitors Other (See Comments)   Lisinopril  Cough        Medication List     PAUSE taking these medications    COSENTYX  IV Wait to take this until: October 05, 2023 Inject into the vein.   methotrexate 2.5 MG tablet Wait to take this until: October 05, 2023 Commonly known as: RHEUMATREX Take 15 mg by mouth once a week.       TAKE these medications    acetaminophen  500 MG tablet Commonly known as: TYLENOL  Take 1,000 mg by mouth every 6 (six) hours as needed for moderate pain.   atorvastatin  20 MG tablet Commonly known as: LIPITOR Take 20 mg by mouth daily.   augmented betamethasone  dipropionate 0.05 % cream Commonly known as: DIPROLENE -AF Apply 1 Application topically in the morning and at bedtime.   cefadroxil  500 MG capsule Commonly known as: DURICEF Take 2 capsules (1,000 mg total) by mouth 2 (two) times daily for 6 days.   cholecalciferol  25 MCG (1000 UNIT) tablet Commonly known as: VITAMIN D3 Take 1,000 Units by mouth daily.   dapagliflozin  propanediol 10 MG Tabs tablet Commonly known as: FARXIGA  Take 10 mg by mouth daily.   Entresto  97-103 MG Generic drug:  sacubitril -valsartan  Take 1 tablet by mouth 2 (two) times daily.   ezetimibe  10 MG tablet Commonly known as: ZETIA  Take 1 tablet (10 mg total) by mouth daily.   Fish Oil 1000 MG Caps Take 1,000 mg by mouth daily.   folic acid  1 MG tablet Commonly known as: FOLVITE  Take 1 mg by mouth daily.   gabapentin  100 MG capsule Commonly known as: NEURONTIN  Take 100 mg by mouth 3 (three) times daily.   glipiZIDE  2.5 MG 24 hr tablet Commonly known as: GLUCOTROL  XL Take 5 mg by mouth daily with breakfast.   HYDROcodone -acetaminophen  5-325 MG tablet Commonly known as: NORCO/VICODIN Take 1 tablet by mouth every 6 (six) hours as needed for moderate pain (pain score 4-6).   metFORMIN  500 MG tablet Commonly known as: GLUCOPHAGE  Take 500 mg by mouth 2 (two) times daily with a meal.   metoprolol  succinate 50 MG 24 hr tablet Commonly known as: TOPROL -XL Take 1 tablet (50 mg total) by mouth daily.   multivitamin with minerals Tabs tablet Take 1 tablet by mouth daily.   rivaroxaban  20 MG Tabs tablet Commonly known as: XARELTO  Take 1 tablet (20 mg total) by mouth daily. 08/04/13 last dose of xarelto  in preparation for colonoscopy   TRESIBA FLEXTOUCH [place] Inject 60 Units into the skin daily at 2 PM.   vitamin C  1000 MG tablet Take 1,000 mg by mouth daily.        Consultations: None  Procedures/Studies:   VAS US  ABI WITH/WO TBI Result Date: 09/29/2023  LOWER EXTREMITY DOPPLER STUDY Patient Name:  Anthony Case  Date of Exam:   09/29/2023 Medical Rec #: [id number]           Accession #:    [id number] Date of Birth: 07-28-1951           Patient Gender: M Patient Age:   38 years Exam Location:  [facility] Procedure:      VAS US  ABI WITH/WO TBI Referring Phys: MAXIMINO SHARPS --------------------------------------------------------------------------------  Indications: Ulceration. High Risk Factors: Hypertension, Diabetes.  Comparison Study: No significant changes seen since previous  exam 12/12/20. Performing Technologist: Garnette Rockers  Examination Guidelines: A complete evaluation includes at minimum, Doppler waveform signals and systolic blood pressure reading at the level of bilateral brachial, anterior tibial, and posterior tibial arteries,  when vessel segments are accessible. Bilateral testing is considered an integral part of a complete examination. Photoelectric Plethysmograph (PPG) waveforms and toe systolic pressure readings are included as required and additional duplex testing as needed. Limited examinations for reoccurring indications may be performed as noted.  ABI Findings: +---------+------------------+-----+---------+----------------+ Right    Rt Pressure (mmHg)IndexWaveform Comment          +---------+------------------+-----+---------+----------------+ Brachial 146                    triphasic                 +---------+------------------+-----+---------+----------------+ PTA                             biphasic Non-compressible +---------+------------------+-----+---------+----------------+ DP                              biphasic Non-compressible +---------+------------------+-----+---------+----------------+ Great Toe81                0.55 Normal                    +---------+------------------+-----+---------+----------------+ +---------+------------------+-----+---------+-------+ Left     Lt Pressure (mmHg)IndexWaveform Comment +---------+------------------+-----+---------+-------+ Brachial 142                    triphasic        +---------+------------------+-----+---------+-------+ PTA      214               1.47 triphasic        +---------+------------------+-----+---------+-------+ DP       199               1.36 triphasic        +---------+------------------+-----+---------+-------+ Great Toe91                0.62 Normal           +---------+------------------+-----+---------+-------+  +-------+-----------+-----------+------------+------------+ ABI/TBIToday's ABIToday's TBIPrevious ABIPrevious TBI +-------+-----------+-----------+------------+------------+ Right  [place]         0.55       [place]          0.57         +-------+-----------+-----------+------------+------------+ Left   1.47       0.62       [place]          0.71         +-------+-----------+-----------+------------+------------+  Bilateral ABIs and TBIs appear essentially unchanged compared to prior study on 12/12/20.  Summary: Right: Resting right ankle-brachial index indicates noncompressible right lower extremity arteries. The right toe-brachial index is abnormal. Left: Resting left ankle-brachial index indicates noncompressible left lower extremity arteries. The left toe-brachial index is abnormal. *See table(s) above for measurements and observations.     Preliminary    DG Foot Complete Right Result Date: 09/27/2023 CLINICAL DATA:  Right foot infection EXAM: RIGHT FOOT COMPLETE - 3+ VIEW COMPARISON:  None Available. FINDINGS: Subtle wound or ulceration along the medial aspect of the great toe at the level of the IP joint. No underlying cortical erosion or periosteal elevation. No acute fracture or dislocation. Moderate degenerative changes, most pronounced at the first MTP joint. Prominent bidirectional calcaneal enthesophytes. Atherosclerotic vascular calcifications. IMPRESSION: Subtle wound or ulceration along the medial aspect of the great toe at the level of the IP joint. No radiographic evidence of osteomyelitis. Electronically Signed   By: Mabel Converse D.O.  On: 09/27/2023 11:00   DG Chest 2 View Result Date: 09/27/2023 CLINICAL DATA:  Shortness of breath EXAM: CHEST - 2 VIEW COMPARISON:  05/17/2009 FINDINGS: Normal heart size and mediastinal contours. No acute infiltrate or edema. No effusion or pneumothorax. Diffuse thoracic ankylosis and fusion. IMPRESSION: No active cardiopulmonary disease. Electronically  Signed   By: Dorn Roulette M.D.   On: 09/27/2023 07:54   US  Venous Img Lower Unilateral Right (DVT) Result Date: 09/25/2023 CLINICAL DATA:  Right calf pain and edema. EXAM: RIGHT LOWER EXTREMITY VENOUS DOPPLER ULTRASOUND TECHNIQUE: Gray-scale sonography with graded compression, as well as color Doppler and duplex ultrasound were performed to evaluate the lower extremity deep venous systems from the level of the common femoral vein and including the common femoral, femoral, profunda femoral, popliteal and calf veins including the posterior tibial, peroneal and gastrocnemius veins when visible. The superficial great saphenous vein was also interrogated. Spectral Doppler was utilized to evaluate flow at rest and with distal augmentation maneuvers in the common femoral, femoral and popliteal veins. COMPARISON:  None Available. FINDINGS: Contralateral Common Femoral Vein: Respiratory phasicity is normal and symmetric with the symptomatic side. No evidence of thrombus. Normal compressibility. Common Femoral Vein: No evidence of thrombus. Normal compressibility, respiratory phasicity and response to augmentation. Saphenofemoral Junction: No evidence of thrombus. Normal compressibility and flow on color Doppler imaging. Profunda Femoral Vein: No evidence of thrombus. Normal compressibility and flow on color Doppler imaging. Femoral Vein: No evidence of thrombus. Normal compressibility, respiratory phasicity and response to augmentation. Popliteal Vein: No evidence of thrombus. Normal compressibility, respiratory phasicity and response to augmentation. Calf Veins: No evidence of thrombus. Normal compressibility and flow on color Doppler imaging. Superficial Great Saphenous Vein: No evidence of thrombus. Normal compressibility. Venous Reflux:  None. Other Findings: No evidence of superficial thrombophlebitis or abnormal fluid collection. IMPRESSION: No evidence of deep venous thrombosis. Electronically Signed   By: Marcey Moan M.D.   On: 09/25/2023 16:22       The results of significant diagnostics from this hospitalization (including imaging, microbiology, ancillary and laboratory) are listed below for reference.     Microbiology: No results found for this or any previous visit (from the past 240 hours).   Labs:  CBC: Recent Labs  Lab 09/27/23 0718 09/28/23 0521 09/29/23 0726  WBC 6.9 4.9 5.0  HGB 12.9* 12.9* 13.1  HCT 39.5 38.5* 39.6  MCV 93.2 91.7 91.5  PLT 272 236 270   BMP &GFR Recent Labs  Lab 09/27/23 0718 09/28/23 0521 09/29/23 0726 09/29/23 0732  NA 137 139  --  138  K 3.9 3.8  --  3.7  CL 107 109  --  105  CO2 20* 22  --  22  GLUCOSE 178* 75  --  148*  BUN 29* 22  --  21  CREATININE 1.25* 0.96  --  1.15  CALCIUM  9.1 9.0  --  9.2  MG  --   --  1.5*  --   PHOS  --   --   --  3.7   Estimated Creatinine Clearance: 73.3 mL/min (by C-G formula based on SCr of 1.15 mg/dL). Liver & Pancreas: Recent Labs  Lab 09/29/23 0732  ALBUMIN 2.6*   No results for input(s): LIPASE, AMYLASE in the last 168 hours. No results for input(s): AMMONIA in the last 168 hours. Diabetic: Recent Labs    09/27/23 0718  HGBA1C 6.0*   Recent Labs  Lab 09/28/23 1226 09/28/23 1728 09/28/23 2157 09/29/23 9164  09/29/23 1210  GLUCAP 85 132* 166* 150* 144*   Cardiac Enzymes: No results for input(s): CKTOTAL, CKMB, CKMBINDEX, TROPONINI in the last 168 hours. No results for input(s): PROBNP in the last 8760 hours. Coagulation Profile: No results for input(s): INR, PROTIME in the last 168 hours. Thyroid  Function Tests: No results for input(s): TSH, T4TOTAL, FREET4, T3FREE, THYROIDAB in the last 72 hours. Lipid Profile: No results for input(s): CHOL, HDL, LDLCALC, TRIG, CHOLHDL, LDLDIRECT in the last 72 hours. Anemia Panel: No results for input(s): VITAMINB12, FOLATE, FERRITIN, TIBC, IRON, RETICCTPCT in the last 72 hours. Urine  analysis:    Component Value Date/Time   COLORURINE AMBER (A) 09/27/2023 1738   APPEARANCEUR HAZY (A) 09/27/2023 1738   LABSPEC 1.027 09/27/2023 1738   PHURINE 5.0 09/27/2023 1738   GLUCOSEU >=500 (A) 09/27/2023 1738   HGBUR NEGATIVE 09/27/2023 1738   BILIRUBINUR NEGATIVE 09/27/2023 1738   KETONESUR NEGATIVE 09/27/2023 1738   PROTEINUR 30 (A) 09/27/2023 1738   UROBILINOGEN 0.2 04/04/2015 2047   NITRITE NEGATIVE 09/27/2023 1738   LEUKOCYTESUR NEGATIVE 09/27/2023 1738   Sepsis Labs: Invalid input(s): PROCALCITONIN, LACTICIDVEN   SIGNED:  Dicie Edelen T Yeraldy Spike, MD  [facility] 09/29/2023, 12:39 PM

## 2023-10-01 ENCOUNTER — Encounter: Payer: Self-pay | Admitting: Podiatry

## 2023-10-01 ENCOUNTER — Ambulatory Visit (INDEPENDENT_AMBULATORY_CARE_PROVIDER_SITE_OTHER): Payer: Medicare Other | Admitting: Podiatry

## 2023-10-01 DIAGNOSIS — L089 Local infection of the skin and subcutaneous tissue, unspecified: Secondary | ICD-10-CM

## 2023-10-01 DIAGNOSIS — E11628 Type 2 diabetes mellitus with other skin complications: Secondary | ICD-10-CM

## 2023-10-01 DIAGNOSIS — E11621 Type 2 diabetes mellitus with foot ulcer: Secondary | ICD-10-CM

## 2023-10-01 DIAGNOSIS — I739 Peripheral vascular disease, unspecified: Secondary | ICD-10-CM | POA: Diagnosis not present

## 2023-10-01 DIAGNOSIS — L97512 Non-pressure chronic ulcer of other part of right foot with fat layer exposed: Secondary | ICD-10-CM | POA: Diagnosis not present

## 2023-10-01 NOTE — Progress Notes (Signed)
  Subjective:  Patient ID: Anthony Case, male    DOB: 09-Feb-1951,  MRN: [id number]  Chief Complaint  Patient presents with   Callouses    Patient states he had a bad blister on right hallux and it busted, patient went to ER and was admitted into the hospital for 2 day. Patient was put on IV. Patient would like doctor to take a look at his right foot hallux and about treatment and pain. Patient states doctor can look at Mychart patient states     73 y.o. male presents with the above complaint. History confirmed with patient.  Was discharged on cefadroxil  and has been taking this twice daily  Objective:  Physical Exam: warm, good capillary refill and weakly palpable DP and PT pulses skin temperature is good has a full-thickness ulceration measuring 1.3 x 1.5 x 0.3 cm on the right plantar medial hallux.  Exposed subcutaneous tissue there is surrounding hyperkeratosis and fibrotic eschar.     Radiographs: Right foot radiographs taken on 09/27/2023 shows no erosions or osteomyelitis  Results  Collected Updated Procedure   09/27/2023 0718 09/27/2023 1441 Hemoglobin A1c [[id number]]  (Abnormal)  Blood   Component Value Units  Hgb A1c MFr Bld 6.0 High   %  Mean Plasma Glucose 125.5  mg/dL       97/97/7974 9281 97/97/7974 1312 Sedimentation rate [[id number]]  (Abnormal)  Blood   Component Value Units  Sed Rate 50 High   mm/hr       09/27/2023 0718 09/27/2023 0733 CBC [[id number]]  (Abnormal)  Blood   Component Value Units  WBC 6.9 K/uL  RBC 4.24 MIL/uL  Hemoglobin 12.9 Low  g/dL  HCT 60.4 %  MCV 06.7 fL  MCH 30.4 pg  MCHC 32.7 g/dL  RDW 85.1 %  Platelets 272 K/uL  nRBC 0.0  %     Assessment:   1. 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 PAD (peripheral artery disease) (HCC)      Plan:  Patient was evaluated and treated and all questions answered.  Ulcer right hallux -We discussed the  etiology and factors that are a part of the wound healing process.  We also discussed the risk of infection both soft tissue and osteomyelitis from open ulceration.  Discussed the risk of limb loss if this happens or worsens. -Debridement as below. -Dressed with Prisma, DSD. -Continue home dressing changes every other day or every 3 days with gauze and Prisma -Continue off-loading with surgical shoe.  He believes he has 1 at home -Vascular testing, ABIs reviewed, likely has microvascular disease which I discussed with him.  Referral placed to vascular surgery to evaluate and establish care -HgbA1c: 6.0 -Last antibiotics: Currently on cefadroxil  1000 mg twice a day.  Continue this until course complete -Imaging: x-ray reviewed, shows no signs of erosions, osteolysis, osteomyelitis or emphysema.  Procedure: Excisional Debridement of Wound Rationale: Removal of non-viable soft tissue from the wound to promote healing.  Anesthesia: none Post-Debridement Wound Measurements: 1.3 cm x 1.5 cm x 0.3 cm  Type of Debridement: Sharp Excisional Tissue Removed: Non-viable soft tissue Depth of Debridement: subcutaneous tissue. Technique: Sharp excisional debridement to bleeding, viable wound base.  Dressing: Dry, sterile, compression dressing. Disposition: Patient tolerated procedure well.     Return in about 3 weeks (around 10/22/2023) for wound care.

## 2023-10-01 NOTE — Patient Instructions (Signed)
Call [phone] to schedule your vascular consultation:  [facility] [address], [place], [place] [address]

## 2023-10-06 DIAGNOSIS — I959 Hypotension, unspecified: Secondary | ICD-10-CM | POA: Diagnosis not present

## 2023-10-06 DIAGNOSIS — R600 Localized edema: Secondary | ICD-10-CM | POA: Diagnosis not present

## 2023-10-06 DIAGNOSIS — D649 Anemia, unspecified: Secondary | ICD-10-CM | POA: Diagnosis not present

## 2023-10-06 DIAGNOSIS — N289 Disorder of kidney and ureter, unspecified: Secondary | ICD-10-CM | POA: Diagnosis not present

## 2023-10-06 DIAGNOSIS — E1140 Type 2 diabetes mellitus with diabetic neuropathy, unspecified: Secondary | ICD-10-CM | POA: Diagnosis not present

## 2023-10-06 DIAGNOSIS — I82402 Acute embolism and thrombosis of unspecified deep veins of left lower extremity: Secondary | ICD-10-CM | POA: Diagnosis not present

## 2023-10-06 DIAGNOSIS — L03031 Cellulitis of right toe: Secondary | ICD-10-CM | POA: Diagnosis not present

## 2023-10-06 DIAGNOSIS — I739 Peripheral vascular disease, unspecified: Secondary | ICD-10-CM | POA: Diagnosis not present

## 2023-10-06 DIAGNOSIS — L4050 Arthropathic psoriasis, unspecified: Secondary | ICD-10-CM | POA: Diagnosis not present

## 2023-10-06 DIAGNOSIS — D6859 Other primary thrombophilia: Secondary | ICD-10-CM | POA: Diagnosis not present

## 2023-10-06 DIAGNOSIS — S91101A Unspecified open wound of right great toe without damage to nail, initial encounter: Secondary | ICD-10-CM | POA: Diagnosis not present

## 2023-10-06 DIAGNOSIS — I5022 Chronic systolic (congestive) heart failure: Secondary | ICD-10-CM | POA: Diagnosis not present

## 2023-10-12 NOTE — Progress Notes (Unsigned)
 Office Note     CC:  Diabetic ulcer of the right foot  Requesting Provider:  Edwin Cap, DPM  HPI: Anthony Case is a 73 y.o. (1951/08/25) male presenting at the request of Dr. Sharl Ma, D.P.M., for evaluation of right foot diabetic ulcer with falsely elevated ABIs.  Medical history includes heart failure, DM2 with neuropathy, PE on Xarelto, PVD, hypertension, psoriatic arthritis on methotrexate, OSA with CPAP, obesity.  He was hospitalized earlier this month with right lower extremity cellulitis/infected diabetic ulcer he had a blister on his foot roughly a month ago after he did a cross-country road trip.  The wound improved dramatically with ceftriaxone administration.  On exam today, Anthony Case was doing well.  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  Anthony Case is now semiretired.  He enjoys hiking, and his goal in the next year or two is to [place].  Regarding his right great toe wound, he has been very happy with how it is healing.  He was started back on methotrexate.  Off of antibiotics. He was seen by Dr. Lilian Kapur every 2 weeks, who also feels as though the wound is healing.  Notes some claudication but can ambulate for up to a half a mile to a mile depending on the terrain. Denies rest pain, denies tissue loss.  The pt is  on a statin for cholesterol management.  The pt is not on a daily aspirin.   Other AC:  xarelto The pt is  on medication for hypertension.   The pt is  diabetic.  Tobacco hx:  none  Past Medical History:  Diagnosis Date   Arthritis    Diabetes mellitus    Hx pulmonary embolism 08/26/2007   required emergent tPA treatment   Hypertension    Shortness of breath    Sleep apnea     Past Surgical History:  Procedure Laterality Date   BACK SURGERY     COLONOSCOPY  2009   adenoma polyp   CYSTOSCOPY/URETEROSCOPY/HOLMIUM  LASER/STENT PLACEMENT Left 07/02/2021   Procedure: CYSTOSCOPY/RETROGRADE/URETEROSCOPY/HOLMIUM LASER/STENT PLACEMENT;  Surgeon: Jerilee Field, MD;  Location: [facility];  Service: Urology;  Laterality: Left;   ELBOW ARTHROSCOPY  2010   IR RADIOLOGIST EVAL & MGMT  12/25/2020   IR RADIOLOGIST EVAL & MGMT  12/28/2020   LUMBAR PERCUTANEOUS PEDICLE SCREW 3 LEVEL N/A 05/25/2013   Procedure: Posterior Percutaneous Fixation from Thoracic nine to Lumbar one vertebrae with arthrodesis of Thoracic eleven Fracture;  Surgeon: Barnett Abu, MD;  Location: [facility];  Service: Neurosurgery;  Laterality: N/A;  Posterior Percutaneous Fixation from Thoracic nine to Lumbar one vertebrae with arthrodesis of Thoracic eleven Fracture   RIGHT/LEFT HEART CATH AND CORONARY ANGIOGRAPHY N/A 08/27/2021   Procedure: RIGHT/LEFT HEART CATH AND CORONARY ANGIOGRAPHY;  Surgeon: Yates Decamp, MD;  Location: [facility];  Service: Cardiovascular;  Laterality: N/A;    Social History   Socioeconomic History   Marital status: Married    Spouse name: Not on file   Number of children: 3   Years of education: 22   Highest education level: Not on file  Occupational History   Occupation: Programmer, applications: [employer]  Tobacco Use   Smoking status: Never   Smokeless tobacco: Never  Vaping Use   Vaping status: Never Used  Substance and Sexual Activity   Alcohol use: Not Currently    Comment:  Previously drinking 3-5 drinks nightly x 20 year   Drug use: No   Sexual activity: Not on file  Other Topics Concern   Not on file  Social History Narrative      Lives with wife      One story home      Highest level edu- masters      Right handed      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   Social Drivers of Corporate investment banker Strain: Not on file  Food Insecurity: No Food Insecurity (09/27/2023)   Hunger Vital Sign    Worried About Running Out of Food in the Last Year: Never true    Ran Out  of Food in the Last Year: Never true  Transportation Needs: No Transportation Needs (09/27/2023)   PRAPARE - Administrator, Civil Service (Medical): No    Lack of Transportation (Non-Medical): No  Physical Activity: Not on file  Stress: Not on file  Social Connections: Socially Integrated (09/27/2023)   Social Connection and Isolation Panel [NHANES]    Frequency of Communication with Friends and Family: More than three times a week    Frequency of Social Gatherings with Friends and Family: More than three times a week    Attends Religious Services: More than 4 times per year    Active Member of [employer] or Organizations: No    Attends Engineer, structural: More than 4 times per year    Marital Status: Married  Catering manager Violence: Not At Risk (09/27/2023)   Humiliation, Afraid, Rape, and Kick questionnaire    Fear of Current or Ex-Partner: No    Emotionally Abused: No    Physically Abused: No    Sexually Abused: No   Family History  Problem Relation Age of Onset   Heart disease Mother    Schizophrenia Mother    Heart disease Father    Diabetes Father    Heart disease Brother     Current Outpatient Medications  Medication Sig Dispense Refill   acetaminophen (TYLENOL) 500 MG tablet Take 1,000 mg by mouth every 6 (six) hours as needed for moderate pain.     Ascorbic Acid (VITAMIN C) 1000 MG tablet Take 1,000 mg by mouth daily.     atorvastatin (LIPITOR) 20 MG tablet Take 20 mg by mouth daily.     augmented betamethasone dipropionate (DIPROLENE-AF) 0.05 % cream Apply 1 Application topically in the morning and at bedtime.     cholecalciferol (VITAMIN D3) 25 MCG (1000 UNIT) tablet Take 1,000 Units by mouth daily.     dapagliflozin propanediol (FARXIGA) 10 MG TABS tablet Take 10 mg by mouth daily.     ezetimibe (ZETIA) 10 MG tablet Take 1 tablet (10 mg total) by mouth daily. 90 tablet 3   folic acid (FOLVITE) 1 MG tablet Take 1 mg by mouth daily.     gabapentin  (NEURONTIN) 100 MG capsule Take 100 mg by mouth 3 (three) times daily.     glipiZIDE (GLUCOTROL XL) 2.5 MG 24 hr tablet Take 5 mg by mouth daily with breakfast.     HYDROcodone-acetaminophen (NORCO/VICODIN) 5-325 MG tablet Take 1 tablet by mouth every 6 (six) hours as needed for moderate pain (pain score 4-6).     Insulin Degludec (TRESIBA FLEXTOUCH [place]) Inject 60 Units into the skin daily at 2 PM.     metFORMIN (GLUCOPHAGE) 500 MG tablet Take 500  mg by mouth 2 (two) times daily with a meal.     methotrexate (RHEUMATREX) 2.5 MG tablet Take 15 mg by mouth once a week. (Patient not taking: Reported on 10/01/2023)     metoprolol succinate (TOPROL-XL) 50 MG 24 hr tablet Take 1 tablet (50 mg total) by mouth daily. 90 tablet 1   Multiple Vitamin (MULTIVITAMIN WITH MINERALS) TABS tablet Take 1 tablet by mouth daily.     Omega-3 Fatty Acids (FISH OIL) 1000 MG CAPS Take 1,000 mg by mouth daily.     rivaroxaban (XARELTO) 20 MG TABS tablet Take 1 tablet (20 mg total) by mouth daily. 08/04/13 last dose of xarelto in preparation for colonoscopy 30 tablet    sacubitril-valsartan (ENTRESTO) 97-103 MG Take 1 tablet by mouth 2 (two) times daily. 180 tablet 3   Secukinumab (COSENTYX IV) Inject into the vein. (Patient not taking: Reported on 10/01/2023)     No current facility-administered medications for this visit.    Allergies  Allergen Reactions   Ace Inhibitors Other (See Comments)   Lisinopril Cough     REVIEW OF SYSTEMS:  [X]  denotes positive finding, [ ]  denotes negative finding Cardiac  Comments:  Chest pain or chest pressure:    Shortness of breath upon exertion:    Short of breath when lying flat:    Irregular heart rhythm:        Vascular    Pain in calf, thigh, or hip brought on by ambulation:    Pain in feet at night that wakes you up from your sleep:     Blood clot in your veins:    Leg swelling:         Pulmonary    Oxygen at home:    Productive cough:     Wheezing:          Neurologic    Sudden weakness in arms or legs:     Sudden numbness in arms or legs:     Sudden onset of difficulty speaking or slurred speech:    Temporary loss of vision in one eye:     Problems with dizziness:         Gastrointestinal    Blood in stool:     Vomited blood:         Genitourinary    Burning when urinating:     Blood in urine:        Psychiatric    Major depression:         Hematologic    Bleeding problems:    Problems with blood clotting too easily:        Skin    Rashes or ulcers:        Constitutional    Fever or chills:      PHYSICAL EXAMINATION:  There were no vitals filed for this visit.  General:  WDWN in NAD; vital signs documented above Gait: Not observed HENT: WNL, normocephalic Pulmonary: normal non-labored breathing , without wheezing Cardiac: regular HR Abdomen: soft, NT, no masses Skin: without rashes Vascular Exam/Pulses:  Right Left  Radial 2+ (normal) 2+ (normal)  Ulnar    Femoral    Popliteal    DP 1+ (weak) 2+ (normal)  PT     Extremities: without ischemic changes, without Gangrene , without cellulitis; with open wounds;  Musculoskeletal: no muscle wasting or atrophy  Neurologic: A&O X 3;  No focal weakness or paresthesias are detected Psychiatric:  The pt has Normal affect.   Non-Invasive Vascular  Imaging:     LOWER EXTREMITY DOPPLER STUDY  Patient Name:  Anthony Case  Date of Exam:   09/29/2023 Medical Rec #: [id number]           Accession #:    [id number] Date of Birth: [birth year]           Patient Gender: M Patient Age:   36 years Exam Location:  [facility] Procedure:      VAS [place] ABI WITH/WO TBI Referring Phys: Madelyn Flavors   --------------------------------------------------------------------------- -----   Indications: Ulceration.  High Risk Factors: Hypertension, Diabetes.    Comparison Study: No significant changes seen since previous exam 12/12/20.  Performing Technologist: Shona Simpson    Examination Guidelines: A complete evaluation includes at minimum, Doppler waveform signals and systolic blood pressure reading at the level of bilateral brachial, anterior tibial, and posterior tibial arteries, when vessel segments are accessible. Bilateral testing is considered an integral part of a complete examination. Photoelectric Plethysmograph (PPG) waveforms and toe systolic pressure readings are included as required and additional duplex testing as needed. Limited examinations for reoccurring indications may be performed as noted.    ABI Findings:  +---------+------------------+-----+---------+----------------+ Right    Rt Pressure (mmHg)IndexWaveform Comment          +---------+------------------+-----+---------+----------------+ Brachial 146                    triphasic                 +---------+------------------+-----+---------+----------------+ PTA                             biphasic Non-compressible +---------+------------------+-----+---------+----------------+ DP                              biphasic Non-compressible +---------+------------------+-----+---------+----------------+ Great Toe81                0.55 Normal                    +---------+------------------+-----+---------+----------------+  +---------+------------------+-----+---------+-------+ Left     Lt Pressure (mmHg)IndexWaveform Comment +---------+------------------+-----+---------+-------+ Brachial 142                    triphasic        +---------+------------------+-----+---------+-------+ PTA      214               1.47 triphasic        +---------+------------------+-----+---------+-------+ DP       199               1.36 triphasic        +---------+------------------+-----+---------+-------+ Great Toe91                0.62 Normal            +---------+------------------+-----+---------+-------+  +-------+-----------+-----------+------------+------------+ ABI/TBIToday's ABIToday's TBIPrevious ABIPrevious TBI +-------+-----------+-----------+------------+------------+ Right  [place]         0.55       [place]          0.57         +-------+-----------+-----------+------------+------------+ Left   1.47       0.62       [place]          0.71         +-------+-----------+-----------+------------+------------+      ASSESSMENT/PLAN: Anthony Case is a 73 y.o. male presenting with wound  to the great toe.  Per patient and notes, the wound is healing nicely.  He is off of antibiotics, and started back on methotrexate.  On physical exam, he had a palpable pulse in the foot. ABIs were reviewed, these were falsely elevated due to longstanding diabetes, however waveforms were preserved, and toe pressure was sufficient for wound healing.  At this point, with a healing wound, Anthony Case does not require any vascular surgery intervention.  We discussed the importance of calling my office should wound healing stagnate or the wound worsen.  At his next visit, if the wound is continuing to heal, I plan to continue conservative therapy.  I asked him to continue his current medication regimen.   Victorino Sparrow, MD [facility] [phone] Total time of patient care including pre-visit research, consultation, and documentation greater than 45 minutes

## 2023-10-15 ENCOUNTER — Encounter: Payer: Self-pay | Admitting: Vascular Surgery

## 2023-10-15 ENCOUNTER — Ambulatory Visit (INDEPENDENT_AMBULATORY_CARE_PROVIDER_SITE_OTHER): Payer: Medicare Other | Admitting: Vascular Surgery

## 2023-10-15 VITALS — BP 82/55 | HR 81 | Temp 98.2°F | Resp 20 | Ht 71.0 in | Wt 250.0 lb

## 2023-10-15 DIAGNOSIS — S91101A Unspecified open wound of right great toe without damage to nail, initial encounter: Secondary | ICD-10-CM

## 2023-10-15 DIAGNOSIS — I739 Peripheral vascular disease, unspecified: Secondary | ICD-10-CM

## 2023-10-19 ENCOUNTER — Ambulatory Visit (INDEPENDENT_AMBULATORY_CARE_PROVIDER_SITE_OTHER): Payer: Medicare Other | Admitting: Podiatry

## 2023-10-19 DIAGNOSIS — E11621 Type 2 diabetes mellitus with foot ulcer: Secondary | ICD-10-CM

## 2023-10-19 DIAGNOSIS — L97512 Non-pressure chronic ulcer of other part of right foot with fat layer exposed: Secondary | ICD-10-CM

## 2023-10-19 NOTE — Progress Notes (Signed)
  Subjective:  Patient ID: Anthony Case, male    DOB: 1951-01-29,  MRN: [id number]  Chief Complaint  Patient presents with   Diabetic ulcer of toe of right foot associated with type 2     2 week follow up for Diabetic ulcer of toe of right foot associated with type 2 diabetes mellitus, with fat layer exposed    73 y.o. male presents with the above complaint. History confirmed with patient.  He has been using the collagen dressings  Objective:  Physical Exam: warm, good capillary refill and weakly palpable DP and PT pulses skin temperature is good has a full-thickness ulceration measuring 1.2 x 1.5 x 0.2 cm on the right plantar medial hallux.  Exposed subcutaneous tissue there is surrounding hyperkeratosis and fibrotic eschar.  No signs of infection  Measurements: 10/01/2023 1.3 x 1.5 x 0.3 centimeters    Radiographs: Right foot radiographs taken on 09/27/2023 shows no erosions or osteomyelitis  Results  Collected Updated Procedure   09/27/2023 0718 09/27/2023 1441 Hemoglobin A1c [[id number]]  (Abnormal)  Blood   Component Value Units  Hgb A1c MFr Bld 6.0 High   %  Mean Plasma Glucose 125.5  mg/dL       56/21/3086 5784 69/62/9528 1312 Sedimentation rate [[id number]]  (Abnormal)  Blood   Component Value Units  Sed Rate 50 High   mm/hr       09/27/2023 0718 09/27/2023 0733 CBC [[id number]]  (Abnormal)  Blood   Component Value Units  WBC 6.9 K/uL  RBC 4.24 MIL/uL  Hemoglobin 12.9 Low  g/dL  HCT 03.4 %  MCV 74.2 fL  MCH 30.4 pg  MCHC 32.7 g/dL  RDW 59.5 %  Platelets 272 K/uL  nRBC 0.0  %     Assessment:   1. Diabetic ulcer of toe of right foot associated with type 2 diabetes mellitus, with fat layer exposed (HCC)      Plan:  Patient was evaluated and treated and all questions answered.  Ulcer right hallux -We discussed the etiology and factors that are a part of the wound healing process.  We also discussed the risk of infection both soft tissue and  osteomyelitis from open ulceration.  Discussed the risk of limb loss if this happens or worsens. -Debridement as below. -Dressed with Prisma, DSD. -Continue home dressing changes every other day or every 3 days with gauze and Prisma -Continue off-loading with surgical shoe. -Vascular surgery feels he has adequate perfusion -Last antibiotics: No further oral antibiotics -Imaging: x-ray reviewed, shows no signs of erosions, osteolysis, osteomyelitis or emphysema.  Procedure: Excisional Debridement of Wound Rationale: Removal of non-viable soft tissue from the wound to promote healing.  Anesthesia: none Post-Debridement Wound Measurements: Noted above Type of Debridement: Sharp Excisional Tissue Removed: Non-viable soft tissue Depth of Debridement: subcutaneous tissue. Technique: Sharp excisional debridement to bleeding, viable wound base.  Dressing: Dry, sterile, compression dressing. Disposition: Patient tolerated procedure well.     Return in about 2 weeks (around 11/02/2023) for wound care.

## 2023-10-20 DIAGNOSIS — L4050 Arthropathic psoriasis, unspecified: Secondary | ICD-10-CM | POA: Diagnosis not present

## 2023-11-03 ENCOUNTER — Ambulatory Visit: Admitting: Neurology

## 2023-11-03 ENCOUNTER — Ambulatory Visit (INDEPENDENT_AMBULATORY_CARE_PROVIDER_SITE_OTHER): Admitting: Adult Health

## 2023-11-03 ENCOUNTER — Encounter: Payer: Self-pay | Admitting: Adult Health

## 2023-11-03 VITALS — BP 106/64 | HR 61 | Ht 71.0 in | Wt 259.0 lb

## 2023-11-03 DIAGNOSIS — G4733 Obstructive sleep apnea (adult) (pediatric): Secondary | ICD-10-CM | POA: Diagnosis not present

## 2023-11-03 NOTE — Patient Instructions (Signed)
 Your Plan:  Continue nightly use of CPAP for adequate sleep apnea management  Follow up with your DME company to discuss change of headgear and if there are any devices that can help chin stay in place      Follow up in 1 year or call earlier if needed     Thank you for coming to see [place] at [facility]. I hope we have been able to provide you high quality care today.  You may receive a patient satisfaction survey over the next few weeks. We would appreciate your feedback and comments so that we may continue to improve ourselves and the health of our patients.

## 2023-11-03 NOTE — Progress Notes (Signed)
 [facility] [address] [place]. [place] [address] [phone]       OFFICE FOLLOW UP NOTE  Mr. Anthony Case Date of Birth:  01-12-1951 Medical Record Number:  [id number]    Primary neurologist: Dr. Vickey Huger Reason for visit: Initial CPAP follow-up    SUBJECTIVE:   CHIEF COMPLAINT:  Chief Complaint  Patient presents with   Obstructive Sleep Apnea    Rm 3 alone Pt is well and stable, having some concerns with mask fitting properly but overall stable. No other concerns     Follow-up visit:  Prior visit: 05/28/2023 with Dr. Vickey Huger  Brief HPI:   Anthony Case is a 73 y.o. male who is followed for OSA on CPAP. Repeat HST 06/2023 showed severe sleep apnea with total AHI 66.7/h and recommended continued use of CPAP, received new machine 07/2023.    Interval history:  Returns today for initial CPAP visit.  Reports overall doing well with CPAP but he continues to struggle with his jaw sliding back, using mouth tape but questions something else to help keep his jaw in place. Currently using hybrid type mask, having some issue with headgear causing pressure on ears.  Reports continued benefit with use of CPAP in regards to daytime energy levels and sleep quality.  ESS 3/24.        ROS:   14 system review of systems performed and negative with exception of those listed in HPI  PMH:  Past Medical History:  Diagnosis Date   Arthritis    Diabetes mellitus    Hx pulmonary embolism 08/26/2007   required emergent tPA treatment   Hypertension    Shortness of breath    Sleep apnea     PSH:  Past Surgical History:  Procedure Laterality Date   BACK SURGERY     COLONOSCOPY  2009   adenoma polyp   CYSTOSCOPY/URETEROSCOPY/HOLMIUM LASER/STENT PLACEMENT Left 07/02/2021   Procedure: CYSTOSCOPY/RETROGRADE/URETEROSCOPY/HOLMIUM LASER/STENT PLACEMENT;  Surgeon: Jerilee Field, MD;  Location: [facility];  Service: Urology;  Laterality: Left;   ELBOW  ARTHROSCOPY  2010   IR RADIOLOGIST EVAL & MGMT  12/25/2020   IR RADIOLOGIST EVAL & MGMT  12/28/2020   LUMBAR PERCUTANEOUS PEDICLE SCREW 3 LEVEL N/A 05/25/2013   Procedure: Posterior Percutaneous Fixation from Thoracic nine to Lumbar one vertebrae with arthrodesis of Thoracic eleven Fracture;  Surgeon: Barnett Abu, MD;  Location: [facility];  Service: Neurosurgery;  Laterality: N/A;  Posterior Percutaneous Fixation from Thoracic nine to Lumbar one vertebrae with arthrodesis of Thoracic eleven Fracture   RIGHT/LEFT HEART CATH AND CORONARY ANGIOGRAPHY N/A 08/27/2021   Procedure: RIGHT/LEFT HEART CATH AND CORONARY ANGIOGRAPHY;  Surgeon: Yates Decamp, MD;  Location: [facility];  Service: Cardiovascular;  Laterality: N/A;    Social History:  Social History   Socioeconomic History   Marital status: Married    Spouse name: Not on file   Number of children: 3   Years of education: 18   Highest education level: Not on file  Occupational History   Occupation: Programmer, applications: [employer]  Tobacco Use   Smoking status: Never   Smokeless tobacco: Never  Vaping Use   Vaping status: Never Used  Substance and Sexual Activity   Alcohol use: Not Currently    Comment: Previously drinking 3-5 drinks nightly x 20 year   Drug use: No   Sexual activity: Not on file  Other Topics Concern   Not on file  Social History Narrative  Lives with wife      One story home      Highest level edu- masters      Right handed      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   Social Drivers of Corporate investment banker Strain: Not on file  Food Insecurity: No Food Insecurity (09/27/2023)   Hunger Vital Sign    Worried About Running Out of Food in the Last Year: Never true    Ran Out of Food in the Last Year: Never true  Transportation Needs: No Transportation Needs (09/27/2023)   PRAPARE - Administrator, Civil Service (Medical): No    Lack of Transportation  (Non-Medical): No  Physical Activity: Not on file  Stress: Not on file  Social Connections: Socially Integrated (09/27/2023)   Social Connection and Isolation Panel [NHANES]    Frequency of Communication with Friends and Family: More than three times a week    Frequency of Social Gatherings with Friends and Family: More than three times a week    Attends Religious Services: More than 4 times per year    Active Member of [employer] or Organizations: No    Attends Engineer, structural: More than 4 times per year    Marital Status: Married  Catering manager Violence: Not At Risk (09/27/2023)   Humiliation, Afraid, Rape, and Kick questionnaire    Fear of Current or Ex-Partner: No    Emotionally Abused: No    Physically Abused: No    Sexually Abused: No    Family History:  Family History  Problem Relation Age of Onset   Heart disease Mother    Schizophrenia Mother    Heart disease Father    Diabetes Father    Heart disease Brother     Medications:   Current Outpatient Medications on File Prior to Visit  Medication Sig Dispense Refill   acetaminophen (TYLENOL) 500 MG tablet Take 1,000 mg by mouth every 6 (six) hours as needed for moderate pain.     Ascorbic Acid (VITAMIN C) 1000 MG tablet Take 1,000 mg by mouth daily.     atorvastatin (LIPITOR) 20 MG tablet Take 20 mg by mouth daily.     augmented betamethasone dipropionate (DIPROLENE-AF) 0.05 % cream Apply 1 Application topically in the morning and at bedtime.     cholecalciferol (VITAMIN D3) 25 MCG (1000 UNIT) tablet Take 1,000 Units by mouth daily.     dapagliflozin propanediol (FARXIGA) 10 MG TABS tablet Take 10 mg by mouth daily.     ezetimibe (ZETIA) 10 MG tablet Take 1 tablet (10 mg total) by mouth daily. 90 tablet 3   folic acid (FOLVITE) 1 MG tablet Take 1 mg by mouth daily.     gabapentin (NEURONTIN) 100 MG capsule Take 100 mg by mouth 3 (three) times daily.     glipiZIDE (GLUCOTROL XL) 2.5 MG 24 hr tablet Take 5 mg by  mouth daily with breakfast.     HYDROcodone-acetaminophen (NORCO/VICODIN) 5-325 MG tablet Take 1 tablet by mouth every 6 (six) hours as needed for moderate pain (pain score 4-6).     Insulin Degludec (TRESIBA FLEXTOUCH [place]) Inject 60 Units into the skin daily at 2 PM.     metFORMIN (GLUCOPHAGE) 500 MG tablet Take 500 mg by mouth 2 (two) times daily with a meal.     methotrexate (RHEUMATREX) 2.5 MG tablet Take 15 mg by mouth once a  week.     metoprolol succinate (TOPROL-XL) 50 MG 24 hr tablet Take 1 tablet (50 mg total) by mouth daily. 90 tablet 1   Multiple Vitamin (MULTIVITAMIN WITH MINERALS) TABS tablet Take 1 tablet by mouth daily.     Omega-3 Fatty Acids (FISH OIL) 1000 MG CAPS Take 1,000 mg by mouth daily.     OZEMPIC, 0.25 OR 0.5 MG/DOSE, 2 MG/3ML SOPN SMARTSIG:0.25 Milligram(s) SUB-Q Once a Week     rivaroxaban (XARELTO) 20 MG TABS tablet Take 1 tablet (20 mg total) by mouth daily. 08/04/13 last dose of xarelto in preparation for colonoscopy 30 tablet    sacubitril-valsartan (ENTRESTO) 97-103 MG Take 1 tablet by mouth 2 (two) times daily. 180 tablet 3   Secukinumab (COSENTYX IV) Inject into the vein.     No current facility-administered medications on file prior to visit.    Allergies:   Allergies  Allergen Reactions   Ace Inhibitors Other (See Comments)   Lisinopril Cough      OBJECTIVE:  Physical Exam  Vitals:   11/03/23 1303  BP: 106/64  Pulse: 61  Weight: 259 lb (117.5 kg)  Height: 5\' 11"  (1.803 m)   Body mass index is 36.12 kg/m. No results found.   General: well developed, well nourished, very pleasant elderly Caucasian male, seated, in no evident distress  Neurologic Exam Mental Status: Awake and fully alert. Oriented to place and time. Recent and remote memory intact. Attention span, concentration and fund of knowledge appropriate. Mood and affect appropriate.  Cranial Nerves: Pupils equal, briskly reactive to light. Extraocular movements full without  nystagmus. Visual fields full to confrontation. Hearing intact. Facial sensation intact. Face, tongue, palate moves normally and symmetrically.  Motor: Normal bulk and tone. Normal strength in all tested extremity muscles Gait and Station: Arises from chair without difficulty. Stance is normal. Gait demonstrates normal stride length and balance without use of AD.         ASSESSMENT/PLAN: BORIS ENGELMANN is a 73 y.o. year old male    OSA on CPAP : Compliance report shows satisfactory usage with optimal residual AHI.  Continue current pressure settings. Will request change of headgear due to ear discomfort, advised to discuss device to help chin stay aligned while using CPAP, discussed potential risk with aspiration with use of mouth tape and CPAP, he has been doing this for years and understands risk but is unable to use CPAP without tape. Discussed continued nightly usage with ensuring greater than 4 hours nightly for optimal benefit and per insurance purposes.  Continue to follow with DME company for any needed supplies or CPAP related concerns     Follow up in 1 year with Maralyn Sago, NP or call earlier if needed   CC:  PCP: Garlan Fillers, MD    I spent 25 minutes of face-to-face and non-face-to-face time with patient.  This included previsit chart review, lab review, study review, order entry, electronic health record documentation, patient education and discussion regarding above diagnoses and treatment plan and answered all other questions to patient's satisfaction  Ihor Austin, [facility]  [facility] [address] [place], [place] [address]  Phone [phone] Fax [phone] Note: This document was prepared with digital dictation and possible smart phrase technology. Any transcriptional errors that result from this process are unintentional.

## 2023-11-05 ENCOUNTER — Ambulatory Visit: Payer: Medicare Other | Admitting: Podiatry

## 2023-11-09 ENCOUNTER — Encounter: Payer: Self-pay | Admitting: Podiatry

## 2023-11-10 ENCOUNTER — Inpatient Hospital Stay (HOSPITAL_COMMUNITY)
Admission: EM | Admit: 2023-11-10 | Discharge: 2023-11-16 | DRG: 872 | Disposition: A | Discharge status: Home / Self Care | Attending: Internal Medicine | Admitting: Internal Medicine

## 2023-11-10 ENCOUNTER — Encounter (HOSPITAL_COMMUNITY): Payer: Self-pay | Admitting: Emergency Medicine

## 2023-11-10 ENCOUNTER — Other Ambulatory Visit: Payer: Self-pay

## 2023-11-10 ENCOUNTER — Emergency Department (HOSPITAL_COMMUNITY)

## 2023-11-10 DIAGNOSIS — L4050 Arthropathic psoriasis, unspecified: Secondary | ICD-10-CM | POA: Diagnosis present

## 2023-11-10 DIAGNOSIS — M25571 Pain in right ankle and joints of right foot: Secondary | ICD-10-CM | POA: Diagnosis not present

## 2023-11-10 DIAGNOSIS — E66811 Obesity, class 1: Secondary | ICD-10-CM | POA: Diagnosis present

## 2023-11-10 DIAGNOSIS — Z7984 Long term (current) use of oral hypoglycemic drugs: Secondary | ICD-10-CM

## 2023-11-10 DIAGNOSIS — B955 Unspecified streptococcus as the cause of diseases classified elsewhere: Secondary | ICD-10-CM | POA: Diagnosis not present

## 2023-11-10 DIAGNOSIS — R000 Tachycardia, unspecified: Secondary | ICD-10-CM | POA: Diagnosis not present

## 2023-11-10 DIAGNOSIS — L97519 Non-pressure chronic ulcer of other part of right foot with unspecified severity: Secondary | ICD-10-CM | POA: Diagnosis present

## 2023-11-10 DIAGNOSIS — N200 Calculus of kidney: Secondary | ICD-10-CM | POA: Diagnosis not present

## 2023-11-10 DIAGNOSIS — E1165 Type 2 diabetes mellitus with hyperglycemia: Secondary | ICD-10-CM | POA: Diagnosis present

## 2023-11-10 DIAGNOSIS — E11649 Type 2 diabetes mellitus with hypoglycemia without coma: Secondary | ICD-10-CM | POA: Diagnosis not present

## 2023-11-10 DIAGNOSIS — Z833 Family history of diabetes mellitus: Secondary | ICD-10-CM

## 2023-11-10 DIAGNOSIS — M19071 Primary osteoarthritis, right ankle and foot: Secondary | ICD-10-CM | POA: Diagnosis not present

## 2023-11-10 DIAGNOSIS — Z7901 Long term (current) use of anticoagulants: Secondary | ICD-10-CM | POA: Diagnosis not present

## 2023-11-10 DIAGNOSIS — M7661 Achilles tendinitis, right leg: Secondary | ICD-10-CM | POA: Diagnosis not present

## 2023-11-10 DIAGNOSIS — E1140 Type 2 diabetes mellitus with diabetic neuropathy, unspecified: Secondary | ICD-10-CM | POA: Diagnosis present

## 2023-11-10 DIAGNOSIS — I350 Nonrheumatic aortic (valve) stenosis: Secondary | ICD-10-CM | POA: Diagnosis not present

## 2023-11-10 DIAGNOSIS — Z818 Family history of other mental and behavioral disorders: Secondary | ICD-10-CM

## 2023-11-10 DIAGNOSIS — B954 Other streptococcus as the cause of diseases classified elsewhere: Secondary | ICD-10-CM | POA: Diagnosis not present

## 2023-11-10 DIAGNOSIS — E119 Type 2 diabetes mellitus without complications: Secondary | ICD-10-CM | POA: Diagnosis not present

## 2023-11-10 DIAGNOSIS — D84821 Immunodeficiency due to drugs: Secondary | ICD-10-CM | POA: Diagnosis present

## 2023-11-10 DIAGNOSIS — Z794 Long term (current) use of insulin: Secondary | ICD-10-CM | POA: Diagnosis not present

## 2023-11-10 DIAGNOSIS — R972 Elevated prostate specific antigen [PSA]: Secondary | ICD-10-CM | POA: Diagnosis not present

## 2023-11-10 DIAGNOSIS — L03115 Cellulitis of right lower limb: Secondary | ICD-10-CM | POA: Diagnosis present

## 2023-11-10 DIAGNOSIS — S91101A Unspecified open wound of right great toe without damage to nail, initial encounter: Secondary | ICD-10-CM | POA: Diagnosis not present

## 2023-11-10 DIAGNOSIS — Z1152 Encounter for screening for COVID-19: Secondary | ICD-10-CM | POA: Diagnosis not present

## 2023-11-10 DIAGNOSIS — A419 Sepsis, unspecified organism: Principal | ICD-10-CM | POA: Diagnosis present

## 2023-11-10 DIAGNOSIS — R6520 Severe sepsis without septic shock: Secondary | ICD-10-CM | POA: Diagnosis present

## 2023-11-10 DIAGNOSIS — E785 Hyperlipidemia, unspecified: Secondary | ICD-10-CM | POA: Diagnosis not present

## 2023-11-10 DIAGNOSIS — Z79899 Other long term (current) drug therapy: Secondary | ICD-10-CM

## 2023-11-10 DIAGNOSIS — D638 Anemia in other chronic diseases classified elsewhere: Secondary | ICD-10-CM | POA: Diagnosis present

## 2023-11-10 DIAGNOSIS — Z7960 Long term (current) use of unspecified immunomodulators and immunosuppressants: Secondary | ICD-10-CM

## 2023-11-10 DIAGNOSIS — I110 Hypertensive heart disease with heart failure: Secondary | ICD-10-CM | POA: Diagnosis present

## 2023-11-10 DIAGNOSIS — Z981 Arthrodesis status: Secondary | ICD-10-CM

## 2023-11-10 DIAGNOSIS — Z86711 Personal history of pulmonary embolism: Secondary | ICD-10-CM | POA: Diagnosis not present

## 2023-11-10 DIAGNOSIS — N400 Enlarged prostate without lower urinary tract symptoms: Secondary | ICD-10-CM | POA: Diagnosis not present

## 2023-11-10 DIAGNOSIS — Z0389 Encounter for observation for other suspected diseases and conditions ruled out: Secondary | ICD-10-CM | POA: Diagnosis not present

## 2023-11-10 DIAGNOSIS — I447 Left bundle-branch block, unspecified: Secondary | ICD-10-CM | POA: Diagnosis present

## 2023-11-10 DIAGNOSIS — A408 Other streptococcal sepsis: Principal | ICD-10-CM | POA: Diagnosis present

## 2023-11-10 DIAGNOSIS — Z1212 Encounter for screening for malignant neoplasm of rectum: Secondary | ICD-10-CM | POA: Diagnosis not present

## 2023-11-10 DIAGNOSIS — R7881 Bacteremia: Secondary | ICD-10-CM | POA: Diagnosis not present

## 2023-11-10 DIAGNOSIS — E11621 Type 2 diabetes mellitus with foot ulcer: Secondary | ICD-10-CM | POA: Diagnosis present

## 2023-11-10 DIAGNOSIS — Z888 Allergy status to other drugs, medicaments and biological substances status: Secondary | ICD-10-CM

## 2023-11-10 DIAGNOSIS — Z86718 Personal history of other venous thrombosis and embolism: Secondary | ICD-10-CM | POA: Diagnosis not present

## 2023-11-10 DIAGNOSIS — M810 Age-related osteoporosis without current pathological fracture: Secondary | ICD-10-CM | POA: Diagnosis not present

## 2023-11-10 DIAGNOSIS — I080 Rheumatic disorders of both mitral and aortic valves: Secondary | ICD-10-CM | POA: Diagnosis present

## 2023-11-10 DIAGNOSIS — Z8249 Family history of ischemic heart disease and other diseases of the circulatory system: Secondary | ICD-10-CM

## 2023-11-10 DIAGNOSIS — L03031 Cellulitis of right toe: Secondary | ICD-10-CM | POA: Diagnosis not present

## 2023-11-10 DIAGNOSIS — Z860101 Personal history of adenomatous and serrated colon polyps: Secondary | ICD-10-CM

## 2023-11-10 DIAGNOSIS — D649 Anemia, unspecified: Secondary | ICD-10-CM | POA: Diagnosis not present

## 2023-11-10 DIAGNOSIS — M7989 Other specified soft tissue disorders: Secondary | ICD-10-CM | POA: Diagnosis not present

## 2023-11-10 DIAGNOSIS — K8020 Calculus of gallbladder without cholecystitis without obstruction: Secondary | ICD-10-CM | POA: Diagnosis present

## 2023-11-10 DIAGNOSIS — I440 Atrioventricular block, first degree: Secondary | ICD-10-CM | POA: Diagnosis present

## 2023-11-10 DIAGNOSIS — I509 Heart failure, unspecified: Secondary | ICD-10-CM | POA: Diagnosis not present

## 2023-11-10 DIAGNOSIS — I4891 Unspecified atrial fibrillation: Secondary | ICD-10-CM | POA: Diagnosis not present

## 2023-11-10 DIAGNOSIS — L03032 Cellulitis of left toe: Secondary | ICD-10-CM | POA: Diagnosis not present

## 2023-11-10 DIAGNOSIS — R590 Localized enlarged lymph nodes: Secondary | ICD-10-CM | POA: Diagnosis not present

## 2023-11-10 DIAGNOSIS — I340 Nonrheumatic mitral (valve) insufficiency: Secondary | ICD-10-CM | POA: Diagnosis not present

## 2023-11-10 DIAGNOSIS — I38 Endocarditis, valve unspecified: Secondary | ICD-10-CM | POA: Diagnosis not present

## 2023-11-10 DIAGNOSIS — Z6836 Body mass index (BMI) 36.0-36.9, adult: Secondary | ICD-10-CM

## 2023-11-10 DIAGNOSIS — I5022 Chronic systolic (congestive) heart failure: Secondary | ICD-10-CM | POA: Diagnosis present

## 2023-11-10 DIAGNOSIS — E872 Acidosis, unspecified: Secondary | ICD-10-CM | POA: Diagnosis present

## 2023-11-10 DIAGNOSIS — G4733 Obstructive sleep apnea (adult) (pediatric): Secondary | ICD-10-CM | POA: Diagnosis present

## 2023-11-10 DIAGNOSIS — S91301A Unspecified open wound, right foot, initial encounter: Secondary | ICD-10-CM | POA: Diagnosis not present

## 2023-11-10 LAB — CBC WITH DIFFERENTIAL/PLATELET
Abs Immature Granulocytes: 0.06 10*3/uL (ref 0.00–0.07)
Basophils Absolute: 0 10*3/uL (ref 0.0–0.1)
Basophils Relative: 0 %
Eosinophils Absolute: 0.1 10*3/uL (ref 0.0–0.5)
Eosinophils Relative: 1 %
HCT: 42.2 % (ref 39.0–52.0)
Hemoglobin: 14.1 g/dL (ref 13.0–17.0)
Immature Granulocytes: 0 %
Lymphocytes Relative: 5 %
Lymphs Abs: 0.7 10*3/uL (ref 0.7–4.0)
MCH: 30.8 pg (ref 26.0–34.0)
MCHC: 33.4 g/dL (ref 30.0–36.0)
MCV: 92.1 fL (ref 80.0–100.0)
Monocytes Absolute: 0.7 10*3/uL (ref 0.1–1.0)
Monocytes Relative: 5 %
Neutro Abs: 12 10*3/uL — ABNORMAL HIGH (ref 1.7–7.7)
Neutrophils Relative %: 89 %
Platelets: 200 10*3/uL (ref 150–400)
RBC: 4.58 MIL/uL (ref 4.22–5.81)
RDW: 14.8 % (ref 11.5–15.5)
WBC: 13.5 10*3/uL — ABNORMAL HIGH (ref 4.0–10.5)
nRBC: 0 % (ref 0.0–0.2)

## 2023-11-10 LAB — COMPREHENSIVE METABOLIC PANEL
ALT: 24 U/L (ref 0–44)
AST: 27 U/L (ref 15–41)
Albumin: 3.5 g/dL (ref 3.5–5.0)
Alkaline Phosphatase: 45 U/L (ref 38–126)
Anion gap: 12 (ref 5–15)
BUN: 22 mg/dL (ref 8–23)
CO2: 22 mmol/L (ref 22–32)
Calcium: 9.8 mg/dL (ref 8.9–10.3)
Chloride: 106 mmol/L (ref 98–111)
Creatinine, Ser: 1.19 mg/dL (ref 0.61–1.24)
GFR, Estimated: >60 mL/min (ref >60)
Glucose, Bld: 126 mg/dL — ABNORMAL HIGH (ref 70–99)
Potassium: 4.4 mmol/L (ref 3.5–5.1)
Sodium: 140 mmol/L (ref 135–145)
Total Bilirubin: 1.6 mg/dL — ABNORMAL HIGH (ref 0.0–1.2)
Total Protein: 6.5 g/dL (ref 6.5–8.1)

## 2023-11-10 LAB — PROTIME-INR
INR: 2.1 — ABNORMAL HIGH (ref 0.8–1.2)
Prothrombin Time: 23.8 s — ABNORMAL HIGH (ref 11.4–15.2)

## 2023-11-10 LAB — I-STAT CG4 LACTIC ACID, ED
Lactic Acid, Venous: 1.9 mmol/L (ref 0.5–1.9)
Lactic Acid, Venous: 2.2 mmol/L (ref 0.5–1.9)
Lactic Acid, Venous: 2.3 mmol/L (ref 0.5–1.9)

## 2023-11-10 LAB — APTT: aPTT: 39 s — ABNORMAL HIGH (ref 24–36)

## 2023-11-10 MED ORDER — LACTATED RINGERS IV BOLUS (SEPSIS)
1000.0000 mL | Freq: Once | INTRAVENOUS | Status: DC
  Administered 2023-11-10: 1000 mL via INTRAVENOUS

## 2023-11-10 MED ORDER — LACTATED RINGERS IV SOLN: INTRAVENOUS | Status: DC

## 2023-11-10 MED ORDER — IOHEXOL 350 MG/ML SOLN
100.0000 mL | Freq: Once | INTRAVENOUS | Status: AC | PRN
  Administered 2023-11-10: 100 mL via INTRAVENOUS

## 2023-11-10 MED ORDER — SODIUM CHLORIDE 0.9 % IV SOLN
2.0000 g | Freq: Once | INTRAVENOUS | Status: AC
  Administered 2023-11-10: 2 g via INTRAVENOUS
  Filled 2023-11-10: qty 12.5

## 2023-11-10 MED ORDER — LACTATED RINGERS IV BOLUS (SEPSIS)
1000.0000 mL | Freq: Once | INTRAVENOUS | Status: AC
  Administered 2023-11-10: 1000 mL via INTRAVENOUS

## 2023-11-10 MED ORDER — METRONIDAZOLE 500 MG/100ML IV SOLN
500.0000 mg | Freq: Once | INTRAVENOUS | Status: AC
  Administered 2023-11-10: 500 mg via INTRAVENOUS
  Filled 2023-11-10: qty 100

## 2023-11-10 MED ORDER — LACTATED RINGERS IV BOLUS (SEPSIS): 1000.0000 mL | Freq: Once | INTRAVENOUS | Status: DC

## 2023-11-10 MED ORDER — ACETAMINOPHEN 500 MG PO TABS
1000.0000 mg | ORAL_TABLET | Freq: Once | ORAL | Status: AC
  Administered 2023-11-10: 1000 mg via ORAL
  Filled 2023-11-10: qty 2

## 2023-11-10 NOTE — ED Triage Notes (Signed)
 Pt c/o right foot pain that radiates up to his groin, pt has recent history of diabetic ulcer to the right foot. Endorses chills at home.

## 2023-11-10 NOTE — ED Provider Notes (Signed)
 [place] EMERGENCY DEPARTMENT AT [facility] Provider Note   CSN: [id number] Arrival date & time: 11/10/23  2005     History  Chief Complaint  Patient presents with   Fever   Groin Pain    Anthony Case is a 73 y.o. male with history of diabetes, hypertension, hyperlipidemia, sleep apnea, DVT/PE on Xarelto, diabetic foot ulcer who presents the emergency department complaining of right groin pain.  Patient states that he had sudden onset of intense pain in the right inguinal fold, that radiated up to his shoulder.  This lasted over 2 hours, prompting him to come to the ER for evaluation.  He had some chills as well.  Family member at bedside states that other household members had a cold last week.  Patient is not experiencing any pain right now.  He did have 1 episode of vomiting upon ER arrival, but no longer feels nauseous.  He was concerned with his history of DVT and PE but that could have something to do with his symptoms.  He previously had a wound on his right great toe, that he follows with podiatry for.  He had been previously admitted for treatment of this, and says overall it is healing fairly well.   Fever Associated symptoms: chills and vomiting   Groin Pain Associated symptoms include abdominal pain.       Home Medications Prior to Admission medications   Medication Sig Start Date End Date Taking? Authorizing Provider  acetaminophen (TYLENOL) 500 MG tablet Take 1,000 mg by mouth every 6 (six) hours as needed for moderate pain.    [provider]  Ascorbic Acid (VITAMIN C) 1000 MG tablet Take 1,000 mg by mouth daily.    [provider]  atorvastatin (LIPITOR) 20 MG tablet Take 20 mg by mouth daily.    [provider]  augmented betamethasone dipropionate (DIPROLENE-AF) 0.05 % cream Apply 1 Application topically in the morning and at bedtime. 03/07/22   [provider]  cholecalciferol (VITAMIN D3) 25 MCG (1000 UNIT)  tablet Take 1,000 Units by mouth daily.    [provider]  dapagliflozin propanediol (FARXIGA) 10 MG TABS tablet Take 10 mg by mouth daily.    [provider]  ezetimibe (ZETIA) 10 MG tablet Take 1 tablet (10 mg total) by mouth daily. 06/13/22   Yates Decamp, MD  folic acid (FOLVITE) 1 MG tablet Take 1 mg by mouth daily.    [provider]  gabapentin (NEURONTIN) 100 MG capsule Take 100 mg by mouth 3 (three) times daily.    [provider]  glipiZIDE (GLUCOTROL XL) 2.5 MG 24 hr tablet Take 5 mg by mouth daily with breakfast. 02/23/19   [provider]  HYDROcodone-acetaminophen (NORCO/VICODIN) 5-325 MG tablet Take 1 tablet by mouth every 6 (six) hours as needed for moderate pain (pain score 4-6).    [provider]  Insulin Degludec (TRESIBA FLEXTOUCH [place]) Inject 60 Units into the skin daily at 2 PM.    [provider]  metFORMIN (GLUCOPHAGE) 500 MG tablet Take 500 mg by mouth 2 (two) times daily with a meal.    [provider]  methotrexate (RHEUMATREX) 2.5 MG tablet Take 15 mg by mouth once a week. 12/02/22   [provider]  metoprolol succinate (TOPROL-XL) 50 MG 24 hr tablet Take 1 tablet (50 mg total) by mouth daily. 09/09/21   Yates Decamp, MD  Multiple Vitamin (MULTIVITAMIN WITH MINERALS) TABS tablet Take 1 tablet by  mouth daily.    [provider]  Omega-3 Fatty Acids (FISH OIL) 1000 MG CAPS Take 1,000 mg by mouth daily.    [provider]  OZEMPIC, 0.25 OR 0.5 MG/DOSE, 2 MG/3ML SOPN SMARTSIG:0.25 Milligram(s) SUB-Q Once a Week 09/30/23   [provider]  rivaroxaban (XARELTO) 20 MG TABS tablet Take 1 tablet (20 mg total) by mouth daily. 08/04/13 last dose of xarelto in preparation for colonoscopy 08/28/21   Yates Decamp, MD  sacubitril-valsartan (ENTRESTO) 97-103 MG Take 1 tablet by mouth 2 (two) times daily. 08/06/22   Yates Decamp, MD  Secukinumab (COSENTYX IV) Inject into the vein.    [provider]      Allergies    Ace inhibitors and Lisinopril    Review of Systems   Review of Systems  Constitutional:  Positive for chills and fever.  Gastrointestinal:  Positive for abdominal pain and vomiting.  Genitourinary:        Right groin pain  All other systems reviewed and are negative.   Physical Exam Updated Vital Signs BP (!) 110/51   Pulse (!) 118   Temp (!) 102.6 F (39.2 C) (Oral)   Resp (!) 26   Ht 5\' 11"  (1.803 m)   Wt 117 kg   SpO2 93%   BMI 35.98 kg/m  Physical Exam Vitals and nursing note reviewed. Exam conducted with a chaperone present.  Constitutional:      Appearance: Normal appearance.  HENT:     Head: Normocephalic and atraumatic.  Eyes:     Conjunctiva/sclera: Conjunctivae normal.  Cardiovascular:     Rate and Rhythm: Regular rhythm. Tachycardia present.  Pulmonary:     Effort: Pulmonary effort is normal. No respiratory distress.     Breath sounds: Normal breath sounds.  Abdominal:     General: There is no distension.     Palpations: Abdomen is soft.     Tenderness: There is no abdominal tenderness.     Hernia: No hernia is present.  Genitourinary:    Penis: Normal and circumcised.      Testes: Normal.     Epididymis:     Right: Normal.     Left: Normal.     Comments: No perineal erythema, tenderness, or crepitus Feet:     Comments: Chronic appearing wound to the lateral R great toe, no surrounding erythema or increased warmth, no purulence Skin:    General: Skin is warm and dry.     Comments: Skin is hot to the touch  Neurological:     General: No focal deficit present.     Mental Status: He is alert.    ED Results / Procedures / Treatments   Labs (all labs ordered are listed, but only abnormal results are displayed) Labs Reviewed  COMPREHENSIVE METABOLIC PANEL - Abnormal; Notable for the following components:      Result Value   Glucose, Bld 126 (*)    Total Bilirubin 1.6 (*)    All other components within normal  limits  CBC WITH DIFFERENTIAL/PLATELET - Abnormal; Notable for the following components:   WBC 13.5 (*)    Neutro Abs 12.0 (*)    All other components within normal limits  PROTIME-INR - Abnormal; Notable for the following components:   Prothrombin Time 23.8 (*)    INR 2.1 (*)    All other components within normal limits  APTT - Abnormal; Notable for the following components:   aPTT 39 (*)    All other components  within normal limits  I-STAT CG4 LACTIC ACID, ED - Abnormal; Notable for the following components:   Lactic Acid, Venous 2.2 (*)    All other components within normal limits  I-STAT CG4 LACTIC ACID, ED - Abnormal; Notable for the following components:   Lactic Acid, Venous 2.3 (*)    All other components within normal limits  CULTURE, BLOOD (ROUTINE X 2)  CULTURE, BLOOD (ROUTINE X 2)  RESP PANEL BY RT-PCR (RSV, FLU A&B, COVID)  RVPGX2  URINALYSIS, W/ REFLEX TO CULTURE (INFECTION SUSPECTED)  I-STAT CG4 LACTIC ACID, ED    EKG EKG Interpretation Date/Time:  Tuesday November 10 2023 21:20:42 EDT Ventricular Rate:  112 PR Interval:    QRS Duration:  151 QT Interval:  380 QTC Calculation: 519 R Axis:   12  Text Interpretation: Junctional tachycardia or sinus tachycardia with first degree AV block (favor pw visible in lead III) Left bundle branch block Confirmed by Alvira Monday ([id number]) on 11/10/2023 9:41:26 PM  Radiology DG Chest Port 1 View Result Date: 11/10/2023 CLINICAL DATA:  Questionable sepsis. EXAM: PORTABLE CHEST 1 VIEW COMPARISON:  September 27, 2023 FINDINGS: The heart size and mediastinal contours are within normal limits. There is no evidence of an acute infiltrate, pleural effusion or pneumothorax. Multilevel degenerative changes and postoperative changes are seen involving the thoracic spine. IMPRESSION: No active cardiopulmonary disease. Electronically Signed   By: Aram Candela M.D.   On: 11/10/2023 22:07    Procedures .Critical Care  Performed by:  Su Monks, PA-C Authorized by: Su Monks, PA-C   Critical care provider statement:    Critical care time (minutes):  30   Critical care was necessary to treat or prevent imminent or life-threatening deterioration of the following conditions:  Sepsis   Critical care was time spent personally by me on the following activities:  Development of treatment plan with patient or surrogate, discussions with consultants, evaluation of patient's response to treatment, examination of patient, ordering and review of laboratory studies, ordering and review of radiographic studies, ordering and performing treatments and interventions, pulse oximetry, re-evaluation of patient's condition and review of old charts   I assumed direction of critical care for this patient from another provider in my specialty: no       Medications Ordered in ED Medications  lactated ringers infusion ( Intravenous New Bag/Given 11/10/23 2253)  metroNIDAZOLE (FLAGYL) IVPB 500 mg (500 mg Intravenous New Bag/Given 11/10/23 2245)  acetaminophen (TYLENOL) tablet 1,000 mg (1,000 mg Oral Given 11/10/23 2132)  lactated ringers bolus 1,000 mL (1,000 mLs Intravenous New Bag/Given 11/10/23 2231)    And  lactated ringers bolus 1,000 mL (1,000 mLs Intravenous New Bag/Given 11/10/23 2236)  ceFEPIme (MAXIPIME) 2 g in sodium chloride 0.9 % 100 mL IVPB (0 g Intravenous Stopped 11/10/23 2301)  iohexol (OMNIPAQUE) 350 MG/ML injection 100 mL (100 mLs Intravenous Contrast Given 11/10/23 2308)    ED Course/ Medical Decision Making/ A&P                                 Medical Decision Making Amount and/or Complexity of Data Reviewed Radiology: ordered.  Risk OTC drugs. Prescription drug management.   This patient is a 73 y.o. male who presents to the ED for concern of R groin pain and fever, this involves an extensive number of treatment options, and is a complaint that carries with it a high risk of complications and morbidity.  The emergent differential diagnosis prior to evaluation includes, but is not limited to,  epididymitis, UTI, testicular torsion, incarcerated/strangulated hernia, appendicitis, nephrolithiasis, pancreatitis, mesenteric ischemia, bowel obstruction, biliary disease, IBD, PUD, hepatitis. This is not an exhaustive differential.   Past Medical History / Co-morbidities / Social History: diabetes, hypertension, hyperlipidemia, sleep apnea, DVT/PE on Xarelto, diabetic foot ulcer  Additional history: Chart reviewed. Pertinent results include: Reviewed discharge note from 2/4, patient was admitted with concern for right lower extremity cellulitis and suspected infected diabetic ulcer.  He follows with [facility], most recent was on 2/24.  Most recent echocardiogram Dec 2024 with LVEF 40-45%.  Physical Exam: Physical exam performed. The pertinent findings include: Febrile to 103.45F, tachycardic. Wound on right foot appears chronic, not cellulitic. Abdomen soft and non-tender. No hernia. GU exam normal.   Lab Tests: I ordered, and personally interpreted labs.  The pertinent results include: WBC 13.5, total bilirubin mildly elevated, otherwise CMP unremarkable.  Initial lactic acid 1.9 repeat was 2.2. Respiratory panel pending.    Imaging Studies: I ordered imaging studies including CXR. I independently visualized and interpreted imaging which showed no acute findings. I agree with the radiologist interpretation.  Pending CT abdomen/pelvis at time of shift change.    Cardiac Monitoring:  The patient was maintained on a cardiac monitor.  My attending physician Dr. Dalene Seltzer viewed and interpreted the cardiac monitored which showed an underlying rhythm of: junctional tachycardia vs sinus tach with first degree AV block. I agree with this interpretation.   Medications: I ordered medication including IVF (reduced volume due to hx of CHF with reduced EF), empiric ABX  for sepsis.  I have reviewed  the patients home medicines and have made adjustments as needed.   Disposition: After consideration of the diagnostic results and the patients response to treatment, I feel that patient will likely require admission for further evaluation and treatment of sepsis. At this time, unknown source. Given empiric ABX and IV fluids.   Patient discussed and care transferred to [facility] at shift change. Please see his/her note for further details regarding further ED course and disposition. Plan at time of handoff is follow up on CT imaging, respiratory panel, UA.   Final Clinical Impression(s) / ED Diagnoses Final diagnoses:  Sepsis without acute organ dysfunction, due to unspecified organism [facility])    Rx / DC Orders ED Discharge Orders     None      Portions of this report may have been transcribed using voice recognition software. Every effort was made to ensure accuracy; however, inadvertent computerized transcription errors may be present.    Jeanella Flattery 11/10/23 2336    Alvira Monday, MD 11/11/23 1215

## 2023-11-10 NOTE — ED Notes (Signed)
 Advised grab 3rd Lactic after fluids boluses per provider Lorin, PA-C

## 2023-11-10 NOTE — ED Notes (Signed)
 Pt just now arrived to [facility] trauma A at this time from triage. Sepsis order placed, was NOT started in triage.

## 2023-11-10 NOTE — Sepsis Progress Note (Signed)
 Following for sepsis monitoring ?

## 2023-11-10 NOTE — ED Notes (Signed)
 Patient transported to CT

## 2023-11-11 ENCOUNTER — Encounter: Payer: Self-pay | Admitting: Podiatry

## 2023-11-11 ENCOUNTER — Encounter (HOSPITAL_COMMUNITY): Payer: Self-pay | Admitting: Family Medicine

## 2023-11-11 ENCOUNTER — Emergency Department (HOSPITAL_COMMUNITY)

## 2023-11-11 DIAGNOSIS — M19071 Primary osteoarthritis, right ankle and foot: Secondary | ICD-10-CM | POA: Diagnosis not present

## 2023-11-11 DIAGNOSIS — Z1152 Encounter for screening for COVID-19: Secondary | ICD-10-CM | POA: Diagnosis not present

## 2023-11-11 DIAGNOSIS — I080 Rheumatic disorders of both mitral and aortic valves: Secondary | ICD-10-CM | POA: Diagnosis present

## 2023-11-11 DIAGNOSIS — B954 Other streptococcus as the cause of diseases classified elsewhere: Secondary | ICD-10-CM | POA: Diagnosis not present

## 2023-11-11 DIAGNOSIS — L03032 Cellulitis of left toe: Secondary | ICD-10-CM | POA: Diagnosis not present

## 2023-11-11 DIAGNOSIS — E1140 Type 2 diabetes mellitus with diabetic neuropathy, unspecified: Secondary | ICD-10-CM | POA: Diagnosis present

## 2023-11-11 DIAGNOSIS — E1165 Type 2 diabetes mellitus with hyperglycemia: Secondary | ICD-10-CM | POA: Diagnosis present

## 2023-11-11 DIAGNOSIS — I447 Left bundle-branch block, unspecified: Secondary | ICD-10-CM | POA: Diagnosis present

## 2023-11-11 DIAGNOSIS — I340 Nonrheumatic mitral (valve) insufficiency: Secondary | ICD-10-CM | POA: Diagnosis not present

## 2023-11-11 DIAGNOSIS — A419 Sepsis, unspecified organism: Secondary | ICD-10-CM | POA: Diagnosis present

## 2023-11-11 DIAGNOSIS — R6520 Severe sepsis without septic shock: Secondary | ICD-10-CM | POA: Diagnosis present

## 2023-11-11 DIAGNOSIS — E66811 Obesity, class 1: Secondary | ICD-10-CM | POA: Diagnosis present

## 2023-11-11 DIAGNOSIS — E785 Hyperlipidemia, unspecified: Secondary | ICD-10-CM

## 2023-11-11 DIAGNOSIS — D84821 Immunodeficiency due to drugs: Secondary | ICD-10-CM | POA: Diagnosis present

## 2023-11-11 DIAGNOSIS — E119 Type 2 diabetes mellitus without complications: Secondary | ICD-10-CM | POA: Diagnosis not present

## 2023-11-11 DIAGNOSIS — E11649 Type 2 diabetes mellitus with hypoglycemia without coma: Secondary | ICD-10-CM | POA: Diagnosis not present

## 2023-11-11 DIAGNOSIS — M7989 Other specified soft tissue disorders: Secondary | ICD-10-CM | POA: Diagnosis not present

## 2023-11-11 DIAGNOSIS — Z794 Long term (current) use of insulin: Secondary | ICD-10-CM

## 2023-11-11 DIAGNOSIS — Z86711 Personal history of pulmonary embolism: Secondary | ICD-10-CM | POA: Diagnosis not present

## 2023-11-11 DIAGNOSIS — M7661 Achilles tendinitis, right leg: Secondary | ICD-10-CM | POA: Diagnosis not present

## 2023-11-11 DIAGNOSIS — D638 Anemia in other chronic diseases classified elsewhere: Secondary | ICD-10-CM | POA: Diagnosis present

## 2023-11-11 DIAGNOSIS — I5022 Chronic systolic (congestive) heart failure: Secondary | ICD-10-CM

## 2023-11-11 DIAGNOSIS — Z86718 Personal history of other venous thrombosis and embolism: Secondary | ICD-10-CM

## 2023-11-11 DIAGNOSIS — R7881 Bacteremia: Secondary | ICD-10-CM | POA: Diagnosis not present

## 2023-11-11 DIAGNOSIS — I38 Endocarditis, valve unspecified: Secondary | ICD-10-CM | POA: Diagnosis not present

## 2023-11-11 DIAGNOSIS — Z7984 Long term (current) use of oral hypoglycemic drugs: Secondary | ICD-10-CM | POA: Diagnosis not present

## 2023-11-11 DIAGNOSIS — Z8249 Family history of ischemic heart disease and other diseases of the circulatory system: Secondary | ICD-10-CM | POA: Diagnosis not present

## 2023-11-11 DIAGNOSIS — B955 Unspecified streptococcus as the cause of diseases classified elsewhere: Secondary | ICD-10-CM | POA: Diagnosis not present

## 2023-11-11 DIAGNOSIS — L03031 Cellulitis of right toe: Secondary | ICD-10-CM | POA: Diagnosis not present

## 2023-11-11 DIAGNOSIS — I4891 Unspecified atrial fibrillation: Secondary | ICD-10-CM | POA: Diagnosis not present

## 2023-11-11 DIAGNOSIS — Z7901 Long term (current) use of anticoagulants: Secondary | ICD-10-CM | POA: Diagnosis not present

## 2023-11-11 DIAGNOSIS — I110 Hypertensive heart disease with heart failure: Secondary | ICD-10-CM | POA: Diagnosis present

## 2023-11-11 DIAGNOSIS — L97519 Non-pressure chronic ulcer of other part of right foot with unspecified severity: Secondary | ICD-10-CM | POA: Diagnosis present

## 2023-11-11 DIAGNOSIS — L4050 Arthropathic psoriasis, unspecified: Secondary | ICD-10-CM | POA: Diagnosis present

## 2023-11-11 DIAGNOSIS — E11621 Type 2 diabetes mellitus with foot ulcer: Secondary | ICD-10-CM | POA: Diagnosis present

## 2023-11-11 DIAGNOSIS — S91301A Unspecified open wound, right foot, initial encounter: Secondary | ICD-10-CM | POA: Diagnosis not present

## 2023-11-11 DIAGNOSIS — I350 Nonrheumatic aortic (valve) stenosis: Secondary | ICD-10-CM | POA: Diagnosis not present

## 2023-11-11 DIAGNOSIS — A408 Other streptococcal sepsis: Secondary | ICD-10-CM | POA: Diagnosis present

## 2023-11-11 DIAGNOSIS — I509 Heart failure, unspecified: Secondary | ICD-10-CM | POA: Diagnosis not present

## 2023-11-11 DIAGNOSIS — L03115 Cellulitis of right lower limb: Secondary | ICD-10-CM | POA: Diagnosis present

## 2023-11-11 DIAGNOSIS — S91101A Unspecified open wound of right great toe without damage to nail, initial encounter: Secondary | ICD-10-CM | POA: Diagnosis not present

## 2023-11-11 DIAGNOSIS — M25571 Pain in right ankle and joints of right foot: Secondary | ICD-10-CM | POA: Diagnosis not present

## 2023-11-11 DIAGNOSIS — E872 Acidosis, unspecified: Secondary | ICD-10-CM | POA: Diagnosis present

## 2023-11-11 LAB — CBC
HCT: 38.7 % — ABNORMAL LOW (ref 39.0–52.0)
Hemoglobin: 12.9 g/dL — ABNORMAL LOW (ref 13.0–17.0)
MCH: 31 pg (ref 26.0–34.0)
MCHC: 33.3 g/dL (ref 30.0–36.0)
MCV: 93 fL (ref 80.0–100.0)
Platelets: 166 10*3/uL (ref 150–400)
RBC: 4.16 MIL/uL — ABNORMAL LOW (ref 4.22–5.81)
RDW: 15.1 % (ref 11.5–15.5)
WBC: 17.6 10*3/uL — ABNORMAL HIGH (ref 4.0–10.5)
nRBC: 0 % (ref 0.0–0.2)

## 2023-11-11 LAB — BASIC METABOLIC PANEL
Anion gap: 9 (ref 5–15)
BUN: 21 mg/dL (ref 8–23)
CO2: 21 mmol/L — ABNORMAL LOW (ref 22–32)
Calcium: 8.8 mg/dL — ABNORMAL LOW (ref 8.9–10.3)
Chloride: 107 mmol/L (ref 98–111)
Creatinine, Ser: 1.17 mg/dL (ref 0.61–1.24)
GFR, Estimated: >60 mL/min (ref >60)
Glucose, Bld: 65 mg/dL — ABNORMAL LOW (ref 70–99)
Potassium: 4 mmol/L (ref 3.5–5.1)
Sodium: 137 mmol/L (ref 135–145)

## 2023-11-11 LAB — MAGNESIUM
Magnesium: 1.3 mg/dL — ABNORMAL LOW (ref 1.7–2.4)
Magnesium: 2 mg/dL (ref 1.7–2.4)

## 2023-11-11 LAB — BLOOD CULTURE ID PANEL (REFLEXED) - BCID2

## 2023-11-11 LAB — HEPATIC FUNCTION PANEL
ALT: 20 U/L (ref 0–44)
AST: 23 U/L (ref 15–41)
Albumin: 2.8 g/dL — ABNORMAL LOW (ref 3.5–5.0)
Alkaline Phosphatase: 30 U/L — ABNORMAL LOW (ref 38–126)
Bilirubin, Direct: 0.3 mg/dL — ABNORMAL HIGH (ref 0.0–0.2)
Indirect Bilirubin: 1.5 mg/dL — ABNORMAL HIGH (ref 0.3–0.9)
Total Bilirubin: 1.8 mg/dL — ABNORMAL HIGH (ref 0.0–1.2)
Total Protein: 5.2 g/dL — ABNORMAL LOW (ref 6.5–8.1)

## 2023-11-11 LAB — CBG MONITORING, ED
Glucose-Capillary: 64 mg/dL — ABNORMAL LOW (ref 70–99)
Glucose-Capillary: 112 mg/dL — ABNORMAL HIGH (ref 70–99)
Glucose-Capillary: 51 mg/dL — ABNORMAL LOW (ref 70–99)
Glucose-Capillary: 106 mg/dL — ABNORMAL HIGH (ref 70–99)
Glucose-Capillary: 90 mg/dL (ref 70–99)

## 2023-11-11 LAB — URINALYSIS, W/ REFLEX TO CULTURE (INFECTION SUSPECTED)
Bilirubin Urine: NEGATIVE
Glucose, UA: >=500 mg/dL — AB
Hgb urine dipstick: NEGATIVE
Ketones, ur: NEGATIVE mg/dL
Leukocytes,Ua: NEGATIVE
Nitrite: NEGATIVE
Protein, ur: NEGATIVE mg/dL
Specific Gravity, Urine: 1.037 — ABNORMAL HIGH (ref 1.005–1.030)
pH: 5 (ref 5.0–8.0)

## 2023-11-11 LAB — CBC WITH DIFFERENTIAL/PLATELET
Abs Immature Granulocytes: 0.2 10*3/uL — ABNORMAL HIGH (ref 0.00–0.07)
Basophils Absolute: 0 10*3/uL (ref 0.0–0.1)
Basophils Relative: 0 %
Eosinophils Absolute: 0 10*3/uL (ref 0.0–0.5)
Eosinophils Relative: 0 %
HCT: 39.8 % (ref 39.0–52.0)
Hemoglobin: 13.5 g/dL (ref 13.0–17.0)
Immature Granulocytes: 1 %
Lymphocytes Relative: 3 %
Lymphs Abs: 0.6 10*3/uL — ABNORMAL LOW (ref 0.7–4.0)
MCH: 31.4 pg (ref 26.0–34.0)
MCHC: 33.9 g/dL (ref 30.0–36.0)
MCV: 92.6 fL (ref 80.0–100.0)
Monocytes Absolute: 1.4 10*3/uL — ABNORMAL HIGH (ref 0.1–1.0)
Monocytes Relative: 7 %
Neutro Abs: 16.1 10*3/uL — ABNORMAL HIGH (ref 1.7–7.7)
Neutrophils Relative %: 89 %
Platelets: 186 10*3/uL (ref 150–400)
RBC: 4.3 MIL/uL (ref 4.22–5.81)
RDW: 15.1 % (ref 11.5–15.5)
WBC: 18.3 10*3/uL — ABNORMAL HIGH (ref 4.0–10.5)
nRBC: 0 % (ref 0.0–0.2)

## 2023-11-11 LAB — RESP PANEL BY RT-PCR (RSV, FLU A&B, COVID)  RVPGX2
Influenza A by PCR: NEGATIVE
Influenza B by PCR: NEGATIVE
Resp Syncytial Virus by PCR: NEGATIVE
SARS Coronavirus 2 by RT PCR: NEGATIVE

## 2023-11-11 LAB — LACTIC ACID, PLASMA: Lactic Acid, Venous: 1.3 mmol/L (ref 0.5–1.9)

## 2023-11-11 LAB — GLUCOSE, CAPILLARY: Glucose-Capillary: 124 mg/dL — ABNORMAL HIGH (ref 70–99)

## 2023-11-11 MED ORDER — INSULIN ASPART 100 UNIT/ML IJ SOLN
0.0000 [IU] | Freq: Every day | INTRAMUSCULAR | Status: DC
  Administered 2023-11-12: 2 [IU] via SUBCUTANEOUS

## 2023-11-11 MED ORDER — SODIUM CHLORIDE 0.9 % IV SOLN: 250.0000 mL | INTRAVENOUS | Status: DC

## 2023-11-11 MED ORDER — VANCOMYCIN HCL 1500 MG/300ML IV SOLN: 1500.0000 mg | INTRAVENOUS | Status: DC

## 2023-11-11 MED ORDER — ACETAMINOPHEN 650 MG RE SUPP: 650.0000 mg | Freq: Four times a day (QID) | RECTAL | Status: DC | PRN

## 2023-11-11 MED ORDER — GABAPENTIN 100 MG PO CAPS
100.0000 mg | ORAL_CAPSULE | Freq: Three times a day (TID) | ORAL | Status: DC
  Administered 2023-11-11 – 2023-11-16 (×17): 100 mg via ORAL
  Filled 2023-11-11 (×17): qty 1

## 2023-11-11 MED ORDER — OXYCODONE HCL 5 MG PO TABS: 5.0000 mg | ORAL_TABLET | ORAL | Status: DC | PRN

## 2023-11-11 MED ORDER — NOREPINEPHRINE 4 MG/250ML-% IV SOLN: 0.0000 ug/min | INTRAVENOUS | Status: DC

## 2023-11-11 MED ORDER — METRONIDAZOLE 500 MG/100ML IV SOLN
500.0000 mg | Freq: Two times a day (BID) | INTRAVENOUS | Status: DC
  Administered 2023-11-11: 500 mg via INTRAVENOUS
  Filled 2023-11-11: qty 100

## 2023-11-11 MED ORDER — ACETAMINOPHEN 325 MG PO TABS
650.0000 mg | ORAL_TABLET | Freq: Four times a day (QID) | ORAL | Status: DC | PRN
  Administered 2023-11-11: 650 mg via ORAL
  Filled 2023-11-11: qty 2

## 2023-11-11 MED ORDER — RIVAROXABAN 20 MG PO TABS
20.0000 mg | ORAL_TABLET | Freq: Every day | ORAL | Status: DC
  Administered 2023-11-11 – 2023-11-14 (×4): 20 mg via ORAL
  Filled 2023-11-11 (×5): qty 1

## 2023-11-11 MED ORDER — MAGNESIUM SULFATE 4 GM/100ML IV SOLN
4.0000 g | Freq: Once | INTRAVENOUS | Status: AC
  Administered 2023-11-11: 4 g via INTRAVENOUS
  Filled 2023-11-11: qty 100

## 2023-11-11 MED ORDER — VANCOMYCIN HCL IN DEXTROSE 1-5 GM/200ML-% IV SOLN: 1000.0000 mg | Freq: Once | INTRAVENOUS | Status: DC

## 2023-11-11 MED ORDER — VANCOMYCIN HCL 2000 MG/400ML IV SOLN
2000.0000 mg | Freq: Once | INTRAVENOUS | Status: AC
  Administered 2023-11-11: 2000 mg via INTRAVENOUS
  Filled 2023-11-11: qty 400

## 2023-11-11 MED ORDER — FENTANYL CITRATE PF 50 MCG/ML IJ SOSY: 12.5000 ug | PREFILLED_SYRINGE | INTRAMUSCULAR | Status: DC | PRN

## 2023-11-11 MED ORDER — INSULIN GLARGINE 100 UNIT/ML ~~LOC~~ SOLN
35.0000 [IU] | Freq: Every day | SUBCUTANEOUS | Status: DC
  Administered 2023-11-11 – 2023-11-12 (×2): 35 [IU] via SUBCUTANEOUS
  Filled 2023-11-11 (×3): qty 0.35

## 2023-11-11 MED ORDER — ATORVASTATIN CALCIUM 10 MG PO TABS
20.0000 mg | ORAL_TABLET | Freq: Every day | ORAL | Status: DC
  Administered 2023-11-11 – 2023-11-16 (×6): 20 mg via ORAL
  Filled 2023-11-11 (×2): qty 2
  Filled 2023-11-11: qty 1
  Filled 2023-11-11 (×3): qty 2

## 2023-11-11 MED ORDER — SENNOSIDES-DOCUSATE SODIUM 8.6-50 MG PO TABS: 1.0000 | ORAL_TABLET | Freq: Every evening | ORAL | Status: DC | PRN

## 2023-11-11 MED ORDER — SODIUM CHLORIDE 0.9 % IV SOLN
2.0000 g | INTRAVENOUS | Status: DC
  Administered 2023-11-11 – 2023-11-12 (×2): 2 g via INTRAVENOUS
  Filled 2023-11-11 (×2): qty 20

## 2023-11-11 MED ORDER — INSULIN ASPART 100 UNIT/ML IJ SOLN
0.0000 [IU] | Freq: Three times a day (TID) | INTRAMUSCULAR | Status: DC
  Administered 2023-11-12 (×3): 2 [IU] via SUBCUTANEOUS
  Administered 2023-11-13 – 2023-11-15 (×5): 1 [IU] via SUBCUTANEOUS
  Administered 2023-11-16: 2 [IU] via SUBCUTANEOUS
  Administered 2023-11-16: 1 [IU] via SUBCUTANEOUS

## 2023-11-11 MED ORDER — NOREPINEPHRINE 4 MG/250ML-% IV SOLN: 2.0000 ug/min | INTRAVENOUS | Status: DC

## 2023-11-11 MED ORDER — SODIUM CHLORIDE 0.9% FLUSH
3.0000 mL | Freq: Two times a day (BID) | INTRAVENOUS | Status: DC
  Administered 2023-11-11 – 2023-11-16 (×12): 3 mL via INTRAVENOUS

## 2023-11-11 MED ORDER — FOLIC ACID 1 MG PO TABS
1.0000 mg | ORAL_TABLET | Freq: Every day | ORAL | Status: DC
  Administered 2023-11-11 – 2023-11-16 (×6): 1 mg via ORAL
  Filled 2023-11-11 (×6): qty 1

## 2023-11-11 MED ORDER — SODIUM CHLORIDE 0.9 % IV SOLN
2.0000 g | Freq: Three times a day (TID) | INTRAVENOUS | Status: DC
  Administered 2023-11-11: 2 g via INTRAVENOUS
  Filled 2023-11-11: qty 12.5

## 2023-11-11 NOTE — ED Notes (Signed)
 PA-C Rob at the bedside to speak with pt regarding his BP and need for admission, wife at bedside.

## 2023-11-11 NOTE — Progress Notes (Signed)
 PHARMACY - PHYSICIAN COMMUNICATION CRITICAL VALUE ALERT - BLOOD CULTURE IDENTIFICATION (BCID)  Anthony Case is an 73 y.o. male who presented to [facility] on 11/10/2023 with a chief complaint of sepsis secondary to right foot infection.   Assessment: 73 year old male admitted with RLE cellulitis/infection. Xray of  foot negative for any osteomyelitis. Blood cultures now with 3/4 strep species.   Name of physician (or Provider) Contacted: R. Pahwani  Current antibiotics: Vanc/cefepime/flagyl  Changes to prescribed antibiotics recommended:  Optimize antibiotics to ceftriaxone 2 gm IV q 24 hours   Results for orders placed or performed during the hospital encounter of 11/10/23  Blood Culture ID Panel (Reflexed) (Collected: 11/10/2023  9:09 PM)  Result Value Ref Range   Enterococcus faecalis NOT DETECTED NOT DETECTED   Enterococcus Faecium NOT DETECTED NOT DETECTED   Listeria monocytogenes NOT DETECTED NOT DETECTED   Staphylococcus species NOT DETECTED NOT DETECTED   Staphylococcus aureus (BCID) NOT DETECTED NOT DETECTED   Staphylococcus epidermidis NOT DETECTED NOT DETECTED   Staphylococcus lugdunensis NOT DETECTED NOT DETECTED   Streptococcus species DETECTED (A) NOT DETECTED   Streptococcus agalactiae NOT DETECTED NOT DETECTED   Streptococcus pneumoniae NOT DETECTED NOT DETECTED   Streptococcus pyogenes NOT DETECTED NOT DETECTED   A.calcoaceticus-baumannii NOT DETECTED NOT DETECTED   Bacteroides fragilis NOT DETECTED NOT DETECTED   Enterobacterales NOT DETECTED NOT DETECTED   Enterobacter cloacae complex NOT DETECTED NOT DETECTED   Escherichia coli NOT DETECTED NOT DETECTED   Klebsiella aerogenes NOT DETECTED NOT DETECTED   Klebsiella oxytoca NOT DETECTED NOT DETECTED   Klebsiella pneumoniae NOT DETECTED NOT DETECTED   Proteus species NOT DETECTED NOT DETECTED   Salmonella species NOT DETECTED NOT DETECTED   Serratia marcescens NOT DETECTED NOT DETECTED   Haemophilus  influenzae NOT DETECTED NOT DETECTED   Neisseria meningitidis NOT DETECTED NOT DETECTED   Pseudomonas aeruginosa NOT DETECTED NOT DETECTED   Stenotrophomonas maltophilia NOT DETECTED NOT DETECTED   Candida albicans NOT DETECTED NOT DETECTED   Candida auris NOT DETECTED NOT DETECTED   Candida glabrata NOT DETECTED NOT DETECTED   Candida krusei NOT DETECTED NOT DETECTED   Candida parapsilosis NOT DETECTED NOT DETECTED   Candida tropicalis NOT DETECTED NOT DETECTED   Cryptococcus neoformans/gattii NOT DETECTED NOT DETECTED    Sharin Mons, PharmD, BCPS, BCIDP Infectious Diseases Clinical Pharmacist Phone: [phone] 11/11/2023  10:40 AM

## 2023-11-11 NOTE — ED Notes (Signed)
 PT CBG 51 nurse notified. Pt given orange juice and sandwich bag

## 2023-11-11 NOTE — ED Provider Notes (Signed)
 Patient signed out to me at shift change.  Sepsis.  Unclear source.  Waiting on CT.  CT notable for cholelithiasis.  No RUQ TTP.    Right foot wound looks similar to picture during last admission.  This might be his source of infection today.   RN informs me that BP is low.  Tells me that patient just took his nighttime dose of entresto.    Will give levophed if hypotension persists.    Patient repositioned and cuff repositioned.  BPs have improved and held.  Will hold off on levo.  I consulted with Dr. Antionette Char, who is appreciated for admitting.   Physical Exam  BP 99/61   Pulse (!) 106   Temp 99.7 F (37.6 C) (Oral)   Resp (!) 24   Ht 5\' 11"  (1.803 m)   Wt 117 kg   SpO2 95%   BMI 35.98 kg/m   Physical Exam Alert and oriented No focal abdominal tenderness Procedures  .Critical Care  Performed by: Roxy Horseman, PA-C Authorized by: Roxy Horseman, PA-C   Critical care provider statement:    Critical care time (minutes):  46   Critical care was necessary to treat or prevent imminent or life-threatening deterioration of the following conditions:  Sepsis   Critical care was time spent personally by me on the following activities:  Development of treatment plan with patient or surrogate, discussions with consultants, evaluation of patient's response to treatment, examination of patient, ordering and review of laboratory studies, ordering and review of radiographic studies, ordering and performing treatments and interventions, pulse oximetry, re-evaluation of patient's condition and review of old charts   ED Course / MDM    Medical Decision Making Amount and/or Complexity of Data Reviewed Radiology: ordered.  Risk OTC drugs. Prescription drug management. Decision regarding hospitalization.          Roxy Horseman, PA-C 11/11/23 0224    Dione Booze, MD 11/11/23 [phone]

## 2023-11-11 NOTE — ED Notes (Signed)
 Per pharmacist Mitzi Davenport, can still hold off on the Levo infusion at this time, will continue to monitor BP

## 2023-11-11 NOTE — Progress Notes (Signed)
 Pharmacy Antibiotic Note  IVIS HENNEMAN is a 73 y.o. male admitted on 11/10/2023 with sepsis.  Pharmacy has been consulted for cefepime and vancomycin dosing.  Plan: Cefepime 2g q8h  Vancomycin 2g x 1 load > vancomycin 1500mg  q24h (eAUC 481, Scr 481, Vd 0.5) F/u renal function, infectious work up and length of therapy Vancomycin levels as needed  Height: 5\' 11"  (180.3 cm) Weight: 117 kg (257 lb 15 oz) IBW/kg (Calculated) : 75.3  Temp (24hrs), Avg:101.7 F (38.7 C), Min:99.1 F (37.3 C), Max:103.1 F (39.5 C)  Recent Labs  Lab 11/10/23 2105 11/10/23 2109 11/10/23 2141 11/10/23 2333  WBC  --  13.5*  --   --   CREATININE  --  1.19  --   --   LATICACIDVEN 1.9  --  2.2* 2.3*    Estimated Creatinine Clearance: 71.9 mL/min (by C-G formula based on SCr of 1.19 mg/dL).    Allergies  Allergen Reactions   Ace Inhibitors Other (See Comments)   Lisinopril Cough    Antimicrobials this admission: Cefepime 3/18 > Vancomycin 3/19 > Flagyl 3/18 >  Microbiology results: 3/18 BCx:  3/18 resp panel: neg   Thank you for allowing pharmacy to be a part of this patient's care.  Marja Kays 11/11/2023 2:29 AM

## 2023-11-11 NOTE — Progress Notes (Addendum)
 PROGRESS NOTE    Anthony Case  QMV:[id number] DOB: Nov 05, 1950 DOA: 11/10/2023 PCP: Garlan Fillers, MD   Brief Narrative:  HPI: Anthony Case is a 73 y.o. male with medical history significant for hypertension, insulin-dependent diabetes mellitus, OSA, history of PE on Xarelto, HFmrEF, psoriatic arthritis, and admission last month for diabetic foot ulcer, now presenting with shaking chills and severe right groin pain.   Patient reports that he was in his usual state of health yesterday when he took his dog for a walk, but developed shaking chills and severe pain in the right groin shortly after this.  The pain eventually eased off but he continues to have intermittent chills, prompting his presentation.  The right great toe ulcer has been improving per patient report.  He denies abdominal pain or diarrhea but did have 1 episode of vomiting today.  He denies dysuria, cough, or shortness of breath.   ED Course: Upon arrival to the ED, patient is found to be febrile to 39.5 C and saturating mid 90s on room air with tachycardia and hypotension.  EKG demonstrates sinus tachycardia with LBBB and prolonged QT interval.  Chest x-ray negative for acute cardiopulmonary disease and no acute findings seen on plain radiographs of the right foot.  CT of the abdomen pelvis notable for mild right inguinal adenopathy.  Labs are most remarkable for WBC 13,500, negative respiratory virus panel, and lactic acid 2.3.   Blood cultures were collected in the ED and the patient was given 2 L of LR, cefepime, and Flagyl.  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  Patient received 2 L of IV fluid bolus in the ED, lactic acidosis resolved.  Still has leukocytosis and persistent fever.  CT abdomen and pelvis shows right inguinal adenopathy, suspect he is bacteremic from skin/soft tissue infection involving RLE.  Right foot x-ray does not show any osteomyelitis signs.  Started on broad-spectrum antibiotics with the suspicion of possible bacteremia and I was just reported by pharmacy that 3/4 bottles of blood culture are growing Streptococcus, per their recommendations, will transition to ceftriaxone and await final culture reports.    Chronic HFmrEF  - Appears compensated    - Hold metoprolol and Entresto given hypotension in ED     Insulin-dependent DM  - A1c was 6.0% in February 2025.  Appears to be taking 48 units of long-acting insulin, metformin and glipizide as well as Ozempic PTA.  Hypoglycemic this morning.  Hold all home medications including long-acting insulin and for now continue SSI and trend blood sugar closely.   Hx of DVT/PE  - Xarelto     Psoriatic arthritis  - Managed with methotrexate and Cosentyx and we will hold those due to active infection.  Hyperlipidemia: Continue statin.  Hyperbilirubinemia: Slightly elevated.  Could be due to severe sepsis.  CT abdomen did not show any liver pathology.  Hypomagnesemia: 1.3.  Will replenish.  Cholelithiasis: Needs follow-up with general surgery as outpatient for elective cholecystectomy.  DVT prophylaxis: rivaroxaban (XARELTO) tablet 20 mg Start: 11/11/23  2200   Code Status: Full Code  Family Communication:  None present at bedside.  Plan of care discussed with patient in length and he/she verbalized understanding and agreed with it.  Status is: Inpatient Remains inpatient appropriate because: Patient is still septic.   Estimated body mass index  is 35.98 kg/m as calculated from the following:   Height as of this encounter: 5\' 11"  (1.803 m).   Weight as of this encounter: 117 kg.    Nutritional Assessment: Body mass index is 35.98 kg/m.Marland Kitchen Seen by dietician.  I agree with the assessment and plan as outlined below: Nutrition Status:        . Skin Assessment: I have examined the patient's skin and I agree with the wound assessment as performed by the wound care RN as outlined below:    Consultants:  None  Procedures:  None  Antimicrobials:  Anti-infectives (From admission, onward)    Start     Dose/Rate Route Frequency Ordered Stop   11/12/23 0500  vancomycin (VANCOREADY) IVPB 1500 mg/300 mL        1,500 mg 150 mL/hr over 120 Minutes Intravenous Every 24 hours 11/11/23 0449     11/11/23 1000  metroNIDAZOLE (FLAGYL) IVPB 500 mg        500 mg 100 mL/hr over 60 Minutes Intravenous Every 12 hours 11/11/23 0221 11/18/23 0959   11/11/23 0600  ceFEPIme (MAXIPIME) 2 g in sodium chloride 0.9 % 100 mL IVPB        2 g 200 mL/hr over 30 Minutes Intravenous Every 8 hours 11/11/23 0248     11/11/23 0230  vancomycin (VANCOCIN) IVPB 1000 mg/200 mL premix  Status:  Discontinued        1,000 mg 200 mL/hr over 60 Minutes Intravenous  Once 11/11/23 0221 11/11/23 0229   11/11/23 0230  vancomycin (VANCOREADY) IVPB 2000 mg/400 mL        2,000 mg 200 mL/hr over 120 Minutes Intravenous  Once 11/11/23 0229 11/11/23 0446   11/10/23 2230  ceFEPIme (MAXIPIME) 2 g in sodium chloride 0.9 % 100 mL IVPB        2 g 200 mL/hr over 30 Minutes Intravenous  Once 11/10/23 2220 11/10/23 2301   11/10/23 2230  metroNIDAZOLE (FLAGYL) IVPB 500 mg        500 mg 100 mL/hr over 60 Minutes Intravenous  Once 11/10/23 2220 11/10/23 2350         Subjective: Patient seen and examined, still in the ED.  He feels much better.  No more right inguinal/abdominal pain and he does not have any other complaint at all.  Objective: Vitals:   11/11/23 0645  11/11/23 0659 11/11/23 0704 11/11/23 0710  BP:   (!) 93/55   Pulse: 95  99 96  Resp: (!) 21  (!) 27   Temp:  (!) 101.4 F (38.6 C)    TempSrc:  Oral    SpO2: 93%  95% 95%  Weight:      Height:        Intake/Output Summary (Last 24 hours) at 11/11/2023 0736 Last data filed at 11/11/2023 0546 Gross per 24 hour  Intake 3614.67 ml  Output 250 ml  Net 3364.67 ml   Filed Weights   11/10/23 2058  Weight: 117 kg    Examination:  General exam: Appears calm and comfortable  Respiratory system: Clear to auscultation. Respiratory effort normal. Cardiovascular system: S1 & S2 heard, RRR. No JVD, murmurs, rubs, gallops or clicks. No pedal edema.  Gastrointestinal system: Abdomen is nondistended, soft and nontender. No organomegaly or masses felt. Normal bowel sounds heard. Central nervous system: Alert and oriented. No focal neurological deficits. Extremities: Symmetric 5 x 5 power. Skin: Healing right great toe wound, no tenderness or warmth. Psychiatry: Judgement and insight appear normal. Mood & affect appropriate.    Data Reviewed: I have personally reviewed following labs and imaging studies  CBC: Recent Labs  Lab 11/10/23 2109 11/11/23 0244 11/11/23 0432  WBC 13.5* 18.3* 17.6*  NEUTROABS 12.0* 16.1*  --   HGB 14.1 13.5 12.9*  HCT 42.2 39.8 38.7*  MCV 92.1 92.6 93.0  PLT 200 186 166   Basic Metabolic Panel: Recent Labs  Lab 11/10/23 2109 11/11/23 0432  NA 140 137  K 4.4 4.0  CL 106 107  CO2 22 21*  GLUCOSE 126* 65*  BUN 22 21  CREATININE 1.19 1.17  CALCIUM 9.8 8.8*  MG  --  1.3*   GFR: Estimated Creatinine Clearance: 73.2 mL/min (by C-G formula based on SCr of 1.17 mg/dL). Liver Function Tests: Recent Labs  Lab 11/10/23 2109 11/11/23 0432  AST 27 23  ALT 24 20  ALKPHOS 45 30*  BILITOT 1.6* 1.8*  PROT 6.5 5.2*  ALBUMIN 3.5 2.8*   No results for input(s): "LIPASE", "AMYLASE" in the last 168 hours. No results for input(s): "AMMONIA" in the last 168  hours. Coagulation Profile: Recent Labs  Lab 11/10/23 2109  INR 2.1*   Cardiac Enzymes: No results for input(s): "CKTOTAL", "CKMB", "CKMBINDEX", "TROPONINI" in the last 168 hours. BNP (last 3 results) No results for input(s): "PROBNP" in the last 8760 hours. HbA1C: No results for input(s): "HGBA1C" in the last 72 hours. CBG: No results for input(s): "GLUCAP" in the last 168 hours. Lipid Profile: No results for input(s): "CHOL", "HDL", "LDLCALC", "TRIG", "CHOLHDL", "LDLDIRECT" in the last 72 hours. Thyroid Function Tests: No results for input(s): "TSH", "T4TOTAL", "FREET4", "T3FREE", "THYROIDAB" in the last 72 hours. Anemia Panel: No results for input(s): "VITAMINB12", "FOLATE", "FERRITIN", "TIBC", "IRON", "RETICCTPCT" in the last 72 hours. Sepsis Labs: Recent Labs  Lab 11/10/23 2105 11/10/23 2141 11/10/23 2333 11/11/23 0432  LATICACIDVEN 1.9 2.2* 2.3* 1.3    Recent Results (from the past 240 hours)  Blood Culture (routine x 2)     Status: None (Preliminary result)   Collection Time: 11/10/23  9:09 PM   Specimen: BLOOD LEFT FOREARM  Result Value Ref Range Status   Specimen Description BLOOD LEFT FOREARM  Final   Special Requests   Final    BOTTLES DRAWN AEROBIC AND ANAEROBIC Blood Culture adequate volume   Culture   Final    NO GROWTH < 12 HOURS Performed at [facility], [address] [place], [place] [address]    Report Status PENDING  Incomplete  Blood Culture (routine x 2)     Status: None (Preliminary result)   Collection Time: 11/10/23  9:20 PM   Specimen: BLOOD RIGHT FOREARM  Result Value Ref Range Status   Specimen Description BLOOD RIGHT FOREARM  Final   Special Requests   Final    BOTTLES DRAWN AEROBIC AND ANAEROBIC Blood Culture adequate volume   Culture   Final    NO GROWTH < 12 HOURS Performed at [facility], [address] [place], [place] [address]    Report Status PENDING  Incomplete  Resp panel by RT-PCR (RSV, Flu A&B, Covid)  Anterior Nasal Swab     Status: None   Collection Time:  11/10/23 11:21 PM   Specimen: Anterior Nasal Swab  Result Value Ref Range Status   SARS Coronavirus 2 by RT PCR NEGATIVE NEGATIVE Final   Influenza A by PCR NEGATIVE NEGATIVE Final   Influenza B by PCR NEGATIVE NEGATIVE Final    Comment: (NOTE) The Xpert Xpress SARS-CoV-2/FLU/RSV plus assay is intended as an aid in the diagnosis of influenza from Nasopharyngeal swab specimens and should not be used as a sole basis for treatment. Nasal washings and aspirates are unacceptable for Xpert Xpress SARS-CoV-2/FLU/RSV testing.  Fact Sheet for Patients: BloggerCourse.com  Fact Sheet for Healthcare Providers: SeriousBroker.it  This test is not yet approved or cleared by the [place] FDA and has been authorized for detection and/or diagnosis of SARS-CoV-2 by FDA under an Emergency Use Authorization (EUA). This EUA will remain in effect (meaning this test can be used) for the duration of the COVID-19 declaration under Section 564(b)(1) of the Act, 21 U.S.C. section 360bbb-3(b)(1), unless the authorization is terminated or revoked.     Resp Syncytial Virus by PCR NEGATIVE NEGATIVE Final    Comment: (NOTE) Fact Sheet for Patients: BloggerCourse.com  Fact Sheet for Healthcare Providers: SeriousBroker.it  This test is not yet approved or cleared by the [place] FDA and has been authorized for detection and/or diagnosis of SARS-CoV-2 by FDA under an Emergency Use Authorization (EUA). This EUA will remain in effect (meaning this test can be used) for the duration of the COVID-19 declaration under Section 564(b)(1) of the Act, 21 U.S.C. section 360bbb-3(b)(1), unless the authorization is terminated or revoked.  Performed at [facility], [address] [place], [place] [address]      Radiology Studies: DG Foot  Complete Right Result Date: 11/11/2023 CLINICAL DATA:  Right great toe wound. EXAM: RIGHT FOOT COMPLETE - 3+ VIEW COMPARISON:  None Available. FINDINGS: There is no evidence of an acute fracture or dislocation. A chronic appearing deformity is seen involving the large plantar calcaneal spur. Degenerative changes are seen involving the dorsal aspect of the mid right foot and multiple interphalangeal joints. A small soft tissue ulceration is seen along the medial aspect of the right great toe. IMPRESSION: 1. Small soft tissue ulceration along the medial aspect of the right great toe. 2. No acute osseous abnormality. 3. Degenerative changes, as described above. Electronically Signed   By: Aram Candela M.D.   On: 11/11/2023 01:44   CT ABDOMEN PELVIS W CONTRAST Result Date: 11/11/2023 CLINICAL DATA:  Chills with right foot pain that radiates up to the groin. EXAM: CT ABDOMEN AND PELVIS WITH CONTRAST TECHNIQUE: Multidetector CT imaging of the abdomen and pelvis was performed using the standard protocol following bolus administration of intravenous contrast. RADIATION DOSE REDUCTION: This exam was performed according to the departmental dose-optimization program which includes automated exposure control, adjustment of the mA and/or kV according to patient size and/or use of iterative reconstruction technique. CONTRAST:  OMNIPAQUE IOHEXOL 350 MG/ML SOLN COMPARISON:  May 23, 2021 FINDINGS: Lower chest: No acute abnormality. Hepatobiliary: No focal liver abnormality is seen. A subcentimeter low-attenuation gallstone is seen within the dependent portion of the gallbladder lumen (axial CT image 25, CT series 3), without evidence of gallbladder wall thickening or biliary dilatation. Pancreas: Unremarkable. No pancreatic ductal dilatation or surrounding inflammatory changes. Spleen: Normal in size without focal abnormality. Adrenals/Urinary Tract: Adrenal glands are unremarkable. Kidneys are normal in size.  Small parapelvic renal cysts are seen on the right. 2 mm and 3 mm nonobstructing renal calculi  are noted within the right kidney. There is no evidence of obstructing renal calculi or hydronephrosis. Bladder is unremarkable. Stomach/Bowel: Stomach is within normal limits. Appendix appears normal. No evidence of bowel wall thickening, distention, or inflammatory changes. Vascular/Lymphatic: Aortic atherosclerosis. There is mild right inguinal lymphadenopathy. Reproductive: The prostate gland is moderately enlarged. Other: No abdominal wall hernia or abnormality. No abdominopelvic ascites. Musculoskeletal: Chronic, degenerative and postoperative changes are seen throughout the thoracolumbar spine. IMPRESSION: 1. Cholelithiasis. 2. Subcentimeter nonobstructing right renal calculi. 3. Mild right inguinal lymphadenopathy. 4. Moderate enlargement of the prostate gland. 5. Aortic atherosclerosis. Aortic Atherosclerosis (ICD10-I70.0). Electronically Signed   By: Aram Candela M.D.   On: 11/11/2023 00:12   DG Chest Port 1 View Result Date: 11/10/2023 CLINICAL DATA:  Questionable sepsis. EXAM: PORTABLE CHEST 1 VIEW COMPARISON:  September 27, 2023 FINDINGS: The heart size and mediastinal contours are within normal limits. There is no evidence of an acute infiltrate, pleural effusion or pneumothorax. Multilevel degenerative changes and postoperative changes are seen involving the thoracic spine. IMPRESSION: No active cardiopulmonary disease. Electronically Signed   By: Aram Candela M.D.   On: 11/10/2023 22:07    Scheduled Meds:  atorvastatin  20 mg Oral Daily   folic acid  1 mg Oral Daily   gabapentin  100 mg Oral TID   insulin aspart  0-5 Units Subcutaneous QHS   insulin aspart  0-6 Units Subcutaneous TID WC   insulin glargine  35 Units Subcutaneous QHS   rivaroxaban  20 mg Oral QHS   sodium chloride flush  3 mL Intravenous Q12H   Continuous Infusions:  ceFEPime (MAXIPIME) IV Stopped (11/11/23 0546)    metronidazole     [START ON 11/12/2023] vancomycin       LOS: 0 days   Hughie Closs, MD [facility]  Total time spent 45 minutes on this very sick patient.  11/11/2023, 7:36 AM   *Please note that this is a verbal dictation therefore any spelling or grammatical errors are due to the "Dragon Medical One" system interpretation.  Please page via Amion and do not message via secure chat for urgent patient care matters. Secure chat can be used for non urgent patient care matters.  How to contact the [facility] Attending or Consulting provider 7A - 7P or covering provider during after hours 7P -7A, for this patient?  Check the care team in [facility] and look for a) attending/consulting [facility] provider listed and b) the [facility] team listed. Page or secure chat 7A-7P. Log into [email or web address] and use [place]'s universal password to access. If you do not have the password, please contact the hospital operator. Locate the [facility] provider you are looking for under [facility] and page to a number that you can be directly reached. If you still have difficulty reaching the provider, please page the [facility] (Director on Call) for the Hospitalists listed on amion for assistance.

## 2023-11-11 NOTE — ED Notes (Signed)
 Pt's breakfast tray is in front of him and he is drinking and eating at this moment.

## 2023-11-11 NOTE — H&P (Signed)
 History and Physical    Anthony Case:[id number] DOB: Apr 17, 1951 DOA: 11/10/2023  PCP: Garlan Fillers, MD   Patient coming from: Home   Chief Complaint: Right groin pain, shaking chills   HPI: Anthony Case is a 73 y.o. male with medical history significant for hypertension, insulin-dependent diabetes mellitus, OSA, history of PE on Xarelto, HFmrEF, psoriatic arthritis, and admission last month for diabetic foot ulcer, now presenting with shaking chills and severe right groin pain.  Patient reports that he was in his usual state of health yesterday when he took his dog for a walk, but developed shaking chills and severe pain in the right groin shortly after this.  The pain eventually eased off but he continues to have intermittent chills, prompting his presentation.  The right great toe ulcer has been improving per patient report.  He denies abdominal pain or diarrhea but did have 1 episode of vomiting today.  He denies dysuria, cough, or shortness of breath.  ED Course: Upon arrival to the ED, patient is found to be febrile to 39.5 C and saturating mid 90s on room air with tachycardia and hypotension.  EKG demonstrates sinus tachycardia with LBBB and prolonged QT interval.  Chest x-ray negative for acute cardiopulmonary disease and no acute findings seen on plain radiographs of the right foot.  CT of the abdomen pelvis notable for mild right inguinal adenopathy.  Labs are most remarkable for WBC 13,500, negative respiratory virus panel, and lactic acid 2.3.  Blood cultures were collected in the ED and the patient was given 2 L of LR, cefepime, and Flagyl.  Review of Systems:  All other systems reviewed and apart from HPI, are negative.  Past Medical History:  Diagnosis Date   Arthritis    Diabetes mellitus    Hx pulmonary embolism 08/26/2007   required emergent tPA treatment   Hypertension    Shortness of breath    Sleep apnea     Past Surgical History:  Procedure  Laterality Date   BACK SURGERY     COLONOSCOPY  2009   adenoma polyp   CYSTOSCOPY/URETEROSCOPY/HOLMIUM LASER/STENT PLACEMENT Left 07/02/2021   Procedure: CYSTOSCOPY/RETROGRADE/URETEROSCOPY/HOLMIUM LASER/STENT PLACEMENT;  Surgeon: Jerilee Field, MD;  Location: [facility];  Service: Urology;  Laterality: Left;   ELBOW ARTHROSCOPY  2010   IR RADIOLOGIST EVAL & MGMT  12/25/2020   IR RADIOLOGIST EVAL & MGMT  12/28/2020   LUMBAR PERCUTANEOUS PEDICLE SCREW 3 LEVEL N/A 05/25/2013   Procedure: Posterior Percutaneous Fixation from Thoracic nine to Lumbar one vertebrae with arthrodesis of Thoracic eleven Fracture;  Surgeon: Barnett Abu, MD;  Location: [facility];  Service: Neurosurgery;  Laterality: N/A;  Posterior Percutaneous Fixation from Thoracic nine to Lumbar one vertebrae with arthrodesis of Thoracic eleven Fracture   RIGHT/LEFT HEART CATH AND CORONARY ANGIOGRAPHY N/A 08/27/2021   Procedure: RIGHT/LEFT HEART CATH AND CORONARY ANGIOGRAPHY;  Surgeon: Yates Decamp, MD;  Location: [facility];  Service: Cardiovascular;  Laterality: N/A;    Social History:   reports that he has never smoked. He has never used smokeless tobacco. He reports that he does not currently use alcohol. He reports that he does not use drugs.  Allergies  Allergen Reactions   Ace Inhibitors Other (See Comments)   Lisinopril Cough    Family History  Problem Relation Age of Onset   Heart disease Mother    Schizophrenia Mother    Heart disease Father    Diabetes Father    Heart disease Brother  Prior to Admission medications   Medication Sig Start Date End Date Taking? Authorizing Provider  acetaminophen (TYLENOL) 500 MG tablet Take 1,000 mg by mouth every 6 (six) hours as needed for moderate pain.    [provider]  Ascorbic Acid (VITAMIN C) 1000 MG tablet Take 1,000 mg by mouth daily.    [provider]  atorvastatin (LIPITOR) 20 MG tablet Take 20 mg by mouth daily.    [provider]   augmented betamethasone dipropionate (DIPROLENE-AF) 0.05 % cream Apply 1 Application topically in the morning and at bedtime. 03/07/22   [provider]  cholecalciferol (VITAMIN D3) 25 MCG (1000 UNIT) tablet Take 1,000 Units by mouth daily.    [provider]  dapagliflozin propanediol (FARXIGA) 10 MG TABS tablet Take 10 mg by mouth daily.    [provider]  ezetimibe (ZETIA) 10 MG tablet Take 1 tablet (10 mg total) by mouth daily. 06/13/22   Yates Decamp, MD  folic acid (FOLVITE) 1 MG tablet Take 1 mg by mouth daily.    [provider]  gabapentin (NEURONTIN) 100 MG capsule Take 100 mg by mouth 3 (three) times daily.    [provider]  glipiZIDE (GLUCOTROL XL) 2.5 MG 24 hr tablet Take 5 mg by mouth daily with breakfast. 02/23/19   [provider]  HYDROcodone-acetaminophen (NORCO/VICODIN) 5-325 MG tablet Take 1 tablet by mouth every 6 (six) hours as needed for moderate pain (pain score 4-6).    [provider]  Insulin Degludec (TRESIBA FLEXTOUCH [place]) Inject 60 Units into the skin daily at 2 PM.    [provider]  metFORMIN (GLUCOPHAGE) 500 MG tablet Take 500 mg by mouth 2 (two) times daily with a meal.    [provider]  methotrexate (RHEUMATREX) 2.5 MG tablet Take 15 mg by mouth once a week. 12/02/22   [provider]  metoprolol succinate (TOPROL-XL) 50 MG 24 hr tablet Take 1 tablet (50 mg total) by mouth daily. 09/09/21   Yates Decamp, MD  Multiple Vitamin (MULTIVITAMIN WITH MINERALS) TABS tablet Take 1 tablet by mouth daily.    [provider]  Omega-3 Fatty Acids (FISH OIL) 1000 MG CAPS Take 1,000 mg by mouth daily.    [provider]  OZEMPIC, 0.25 OR 0.5 MG/DOSE, 2 MG/3ML SOPN SMARTSIG:0.25 Milligram(s) SUB-Q Once a Week 09/30/23   [provider]  rivaroxaban (XARELTO) 20 MG TABS tablet Take 1 tablet (20 mg total) by mouth daily. 08/04/13 last dose of xarelto in preparation for  colonoscopy 08/28/21   Yates Decamp, MD  sacubitril-valsartan (ENTRESTO) 97-103 MG Take 1 tablet by mouth 2 (two) times daily. 08/06/22   Yates Decamp, MD  Secukinumab (COSENTYX IV) Inject into the vein.    [provider]    Physical Exam: Vitals:   11/11/23 0130 11/11/23 0145 11/11/23 0200 11/11/23 0215  BP: (!) 94/57 108/70 (!) 105/59 99/61  Pulse: (!) 109 (!) 112 (!) 106 (!) 106  Resp: (!) 28 (!) 22 (!) 23 (!) 24  Temp:      TempSrc:      SpO2: 95% 93% 94% 95%  Weight:      Height:         Constitutional: NAD, no pallor or diaphoresis   Eyes: PERTLA, lids and conjunctivae normal ENMT: Mucous membranes are moist. Posterior pharynx clear of any exudate or lesions.   Neck: supple, no masses  Respiratory: no wheezing, no crackles. No accessory muscle use.  Cardiovascular: S1 &  S2 heard, regular rate and rhythm. No extremity edema.   Abdomen: No tenderness, soft. Bowel sounds active.  Musculoskeletal: no clubbing / cyanosis. No joint deformity upper and lower extremities.   Skin: Right great toe ulceration with scant bloody drainage, faint surrounding erythema. Warm, dry, well-perfused. Neurologic: CN 2-12 grossly intact. Moving all extremities. Alert and oriented.  Psychiatric: Calm. Cooperative.    Labs and Imaging on Admission: I have personally reviewed following labs and imaging studies  CBC: Recent Labs  Lab 11/10/23 2109  WBC 13.5*  NEUTROABS 12.0*  HGB 14.1  HCT 42.2  MCV 92.1  PLT 200   Basic Metabolic Panel: Recent Labs  Lab 11/10/23 2109  NA 140  K 4.4  CL 106  CO2 22  GLUCOSE 126*  BUN 22  CREATININE 1.19  CALCIUM 9.8   GFR: Estimated Creatinine Clearance: 71.9 mL/min (by C-G formula based on SCr of 1.19 mg/dL). Liver Function Tests: Recent Labs  Lab 11/10/23 2109  AST 27  ALT 24  ALKPHOS 45  BILITOT 1.6*  PROT 6.5  ALBUMIN 3.5   No results for input(s): "LIPASE", "AMYLASE" in the last 168 hours. No results for input(s): "AMMONIA"  in the last 168 hours. Coagulation Profile: Recent Labs  Lab 11/10/23 2109  INR 2.1*   Cardiac Enzymes: No results for input(s): "CKTOTAL", "CKMB", "CKMBINDEX", "TROPONINI" in the last 168 hours. BNP (last 3 results) No results for input(s): "PROBNP" in the last 8760 hours. HbA1C: No results for input(s): "HGBA1C" in the last 72 hours. CBG: No results for input(s): "GLUCAP" in the last 168 hours. Lipid Profile: No results for input(s): "CHOL", "HDL", "LDLCALC", "TRIG", "CHOLHDL", "LDLDIRECT" in the last 72 hours. Thyroid Function Tests: No results for input(s): "TSH", "T4TOTAL", "FREET4", "T3FREE", "THYROIDAB" in the last 72 hours. Anemia Panel: No results for input(s): "VITAMINB12", "FOLATE", "FERRITIN", "TIBC", "IRON", "RETICCTPCT" in the last 72 hours. Urine analysis:    Component Value Date/Time   COLORURINE YELLOW 11/10/2023 2353   APPEARANCEUR HAZY (A) 11/10/2023 2353   LABSPEC 1.037 (H) 11/10/2023 2353   PHURINE 5.0 11/10/2023 2353   GLUCOSEU >=500 (A) 11/10/2023 2353   HGBUR NEGATIVE 11/10/2023 2353   BILIRUBINUR NEGATIVE 11/10/2023 2353   KETONESUR NEGATIVE 11/10/2023 2353   PROTEINUR NEGATIVE 11/10/2023 2353   UROBILINOGEN 0.2 04/04/2015 2047   NITRITE NEGATIVE 11/10/2023 2353   LEUKOCYTESUR NEGATIVE 11/10/2023 2353   Sepsis Labs: @LABRCNTIP (procalcitonin:4,lacticidven:4) ) Recent Results (from the past 240 hours)  Resp panel by RT-PCR (RSV, Flu A&B, Covid) Anterior Nasal Swab     Status: None   Collection Time: 11/10/23 11:21 PM   Specimen: Anterior Nasal Swab  Result Value Ref Range Status   SARS Coronavirus 2 by RT PCR NEGATIVE NEGATIVE Final   Influenza A by PCR NEGATIVE NEGATIVE Final   Influenza B by PCR NEGATIVE NEGATIVE Final    Comment: (NOTE) The Xpert Xpress SARS-CoV-2/FLU/RSV plus assay is intended as an aid in the diagnosis of influenza from Nasopharyngeal swab specimens and should not be used as a sole basis for treatment. Nasal washings  and aspirates are unacceptable for Xpert Xpress SARS-CoV-2/FLU/RSV testing.  Fact Sheet for Patients: BloggerCourse.com  Fact Sheet for Healthcare Providers: SeriousBroker.it  This test is not yet approved or cleared by the [place] FDA and has been authorized for detection and/or diagnosis of SARS-CoV-2 by FDA under an Emergency Use Authorization (EUA). This EUA will remain in effect (meaning this test can be used) for the duration of the COVID-19 declaration under Section  564(b)(1) of the Act, 21 U.S.C. section 360bbb-3(b)(1), unless the authorization is terminated or revoked.     Resp Syncytial Virus by PCR NEGATIVE NEGATIVE Final    Comment: (NOTE) Fact Sheet for Patients: BloggerCourse.com  Fact Sheet for Healthcare Providers: SeriousBroker.it  This test is not yet approved or cleared by the [place] FDA and has been authorized for detection and/or diagnosis of SARS-CoV-2 by FDA under an Emergency Use Authorization (EUA). This EUA will remain in effect (meaning this test can be used) for the duration of the COVID-19 declaration under Section 564(b)(1) of the Act, 21 U.S.C. section 360bbb-3(b)(1), unless the authorization is terminated or revoked.  Performed at [facility], [address], [place], [place] [address]      Radiological Exams on Admission: DG Foot Complete Right Result Date: 11/11/2023 CLINICAL DATA:  Right great toe wound. EXAM: RIGHT FOOT COMPLETE - 3+ VIEW COMPARISON:  None Available. FINDINGS: There is no evidence of an acute fracture or dislocation. A chronic appearing deformity is seen involving the large plantar calcaneal spur. Degenerative changes are seen involving the dorsal aspect of the mid right foot and multiple interphalangeal joints. A small soft tissue ulceration is seen along the medial aspect of the right great toe.  IMPRESSION: 1. Small soft tissue ulceration along the medial aspect of the right great toe. 2. No acute osseous abnormality. 3. Degenerative changes, as described above. Electronically Signed   By: Aram Candela M.D.   On: 11/11/2023 01:44   CT ABDOMEN PELVIS W CONTRAST Result Date: 11/11/2023 CLINICAL DATA:  Chills with right foot pain that radiates up to the groin. EXAM: CT ABDOMEN AND PELVIS WITH CONTRAST TECHNIQUE: Multidetector CT imaging of the abdomen and pelvis was performed using the standard protocol following bolus administration of intravenous contrast. RADIATION DOSE REDUCTION: This exam was performed according to the departmental dose-optimization program which includes automated exposure control, adjustment of the mA and/or kV according to patient size and/or use of iterative reconstruction technique. CONTRAST:  OMNIPAQUE IOHEXOL 350 MG/ML SOLN COMPARISON:  May 23, 2021 FINDINGS: Lower chest: No acute abnormality. Hepatobiliary: No focal liver abnormality is seen. A subcentimeter low-attenuation gallstone is seen within the dependent portion of the gallbladder lumen (axial CT image 25, CT series 3), without evidence of gallbladder wall thickening or biliary dilatation. Pancreas: Unremarkable. No pancreatic ductal dilatation or surrounding inflammatory changes. Spleen: Normal in size without focal abnormality. Adrenals/Urinary Tract: Adrenal glands are unremarkable. Kidneys are normal in size. Small parapelvic renal cysts are seen on the right. 2 mm and 3 mm nonobstructing renal calculi are noted within the right kidney. There is no evidence of obstructing renal calculi or hydronephrosis. Bladder is unremarkable. Stomach/Bowel: Stomach is within normal limits. Appendix appears normal. No evidence of bowel wall thickening, distention, or inflammatory changes. Vascular/Lymphatic: Aortic atherosclerosis. There is mild right inguinal lymphadenopathy. Reproductive: The prostate gland is  moderately enlarged. Other: No abdominal wall hernia or abnormality. No abdominopelvic ascites. Musculoskeletal: Chronic, degenerative and postoperative changes are seen throughout the thoracolumbar spine. IMPRESSION: 1. Cholelithiasis. 2. Subcentimeter nonobstructing right renal calculi. 3. Mild right inguinal lymphadenopathy. 4. Moderate enlargement of the prostate gland. 5. Aortic atherosclerosis. Aortic Atherosclerosis (ICD10-I70.0). Electronically Signed   By: Aram Candela M.D.   On: 11/11/2023 00:12   DG Chest Port 1 View Result Date: 11/10/2023 CLINICAL DATA:  Questionable sepsis. EXAM: PORTABLE CHEST 1 VIEW COMPARISON:  September 27, 2023 FINDINGS: The heart size and mediastinal contours are within normal limits. There is  no evidence of an acute infiltrate, pleural effusion or pneumothorax. Multilevel degenerative changes and postoperative changes are seen involving the thoracic spine. IMPRESSION: No active cardiopulmonary disease. Electronically Signed   By: Aram Candela M.D.   On: 11/10/2023 22:07    EKG: Independently reviewed. Sinus tachycardia, rate 112, LBBB.   Assessment/Plan   1. Sepsis  - Right inguinal adenopathy noted on CT; suspect he is bacteremic from skin/soft tissue infection involving RLE  - Continue broad-spectrum antibiotics, trend lactate, follow cultures and clinical course    2. Chronic HFmrEF  - Appears compensated    - Hold metoprolol and Entresto given hypotension in ED    3. Insulin-dependent DM  - A1c was 6.0% in February 2025   - Check CBGs, continue basal insulin, add sliding-scale correctional for now    4. Hx of DVT/PE  - Xarelto    5. Psoriatic arthritis  - Managed with methotrexate and Cosentyx    DVT prophylaxis: Xarelto  Code Status: Full  Level of Care: Level of care: Progressive Family Communication: Wife at bedside  Disposition Plan:  Patient is from: home  Anticipated d/c is to: TBD Anticipated d/c date is: 11/15/23  Patient  currently: pending cultures, clinical improvement  Consults called: None  Admission status: Inpatient     Briscoe Deutscher, MD [facility]  11/11/2023, 2:22 AM

## 2023-11-11 NOTE — ED Notes (Signed)
 Wife going home to tend to animals and rest, number is in chart for any changes or question, wife will return after while. Pt resting, no needs at current, BP stable, reports no pain at this time. Call bell in reach, side rails up x2.   Harrington,Susan (Spouse) [phone] ([place])

## 2023-11-12 ENCOUNTER — Inpatient Hospital Stay (HOSPITAL_COMMUNITY)

## 2023-11-12 DIAGNOSIS — L97519 Non-pressure chronic ulcer of other part of right foot with unspecified severity: Secondary | ICD-10-CM

## 2023-11-12 DIAGNOSIS — L03031 Cellulitis of right toe: Secondary | ICD-10-CM

## 2023-11-12 DIAGNOSIS — A419 Sepsis, unspecified organism: Secondary | ICD-10-CM | POA: Diagnosis not present

## 2023-11-12 DIAGNOSIS — B954 Other streptococcus as the cause of diseases classified elsewhere: Secondary | ICD-10-CM

## 2023-11-12 LAB — CBC
HCT: 37.1 % — ABNORMAL LOW (ref 39.0–52.0)
Hemoglobin: 12 g/dL — ABNORMAL LOW (ref 13.0–17.0)
MCH: 30.1 pg (ref 26.0–34.0)
MCHC: 32.3 g/dL (ref 30.0–36.0)
MCV: 93 fL (ref 80.0–100.0)
Platelets: 174 10*3/uL (ref 150–400)
RBC: 3.99 MIL/uL — ABNORMAL LOW (ref 4.22–5.81)
RDW: 15.5 % (ref 11.5–15.5)
WBC: 12 10*3/uL — ABNORMAL HIGH (ref 4.0–10.5)
nRBC: 0 % (ref 0.0–0.2)

## 2023-11-12 LAB — GLUCOSE, CAPILLARY
Glucose-Capillary: 151 mg/dL — ABNORMAL HIGH (ref 70–99)
Glucose-Capillary: 248 mg/dL — ABNORMAL HIGH (ref 70–99)
Glucose-Capillary: 238 mg/dL — ABNORMAL HIGH (ref 70–99)
Glucose-Capillary: 208 mg/dL — ABNORMAL HIGH (ref 70–99)
Glucose-Capillary: 247 mg/dL — ABNORMAL HIGH (ref 70–99)

## 2023-11-12 LAB — BASIC METABOLIC PANEL
Anion gap: 3 — ABNORMAL LOW (ref 5–15)
BUN: 24 mg/dL — ABNORMAL HIGH (ref 8–23)
CO2: 22 mmol/L (ref 22–32)
Calcium: 8.5 mg/dL — ABNORMAL LOW (ref 8.9–10.3)
Chloride: 108 mmol/L (ref 98–111)
Creatinine, Ser: 1.24 mg/dL (ref 0.61–1.24)
GFR, Estimated: >60 mL/min (ref >60)
Glucose, Bld: 138 mg/dL — ABNORMAL HIGH (ref 70–99)
Potassium: 3.9 mmol/L (ref 3.5–5.1)
Sodium: 133 mmol/L — ABNORMAL LOW (ref 135–145)

## 2023-11-12 LAB — MAGNESIUM: Magnesium: 2.1 mg/dL (ref 1.7–2.4)

## 2023-11-12 MED ORDER — EZETIMIBE 10 MG PO TABS
10.0000 mg | ORAL_TABLET | Freq: Every day | ORAL | Status: DC
  Administered 2023-11-12 – 2023-11-16 (×5): 10 mg via ORAL
  Filled 2023-11-12 (×5): qty 1

## 2023-11-12 MED ORDER — SACUBITRIL-VALSARTAN 97-103 MG PO TABS
1.0000 | ORAL_TABLET | Freq: Two times a day (BID) | ORAL | Status: DC
  Administered 2023-11-12 – 2023-11-16 (×9): 1 via ORAL
  Filled 2023-11-12 (×9): qty 1

## 2023-11-12 MED ORDER — VITAMIN D 25 MCG (1000 UNIT) PO TABS
1000.0000 [IU] | ORAL_TABLET | Freq: Every day | ORAL | Status: DC
  Administered 2023-11-12 – 2023-11-16 (×5): 1000 [IU] via ORAL
  Filled 2023-11-12 (×5): qty 1

## 2023-11-12 MED ORDER — ORAL CARE MOUTH RINSE: 15.0000 mL | OROMUCOSAL | Status: DC | PRN

## 2023-11-12 MED ORDER — GADOBUTROL 1 MMOL/ML IV SOLN
10.0000 mL | Freq: Once | INTRAVENOUS | Status: AC | PRN
  Administered 2023-11-12: 10 mL via INTRAVENOUS

## 2023-11-12 MED ORDER — METOPROLOL SUCCINATE ER 50 MG PO TB24
50.0000 mg | ORAL_TABLET | Freq: Every day | ORAL | Status: DC
  Administered 2023-11-12 – 2023-11-16 (×4): 50 mg via ORAL
  Filled 2023-11-12 (×4): qty 1

## 2023-11-12 NOTE — Plan of Care (Signed)
   Problem: Coping: Goal: Ability to adjust to condition or change in health will improve Outcome: Progressing   Problem: Nutritional: Goal: Maintenance of adequate nutrition will improve Outcome: Progressing   Problem: Skin Integrity: Goal: Risk for impaired skin integrity will decrease Outcome: Progressing

## 2023-11-12 NOTE — Consult Note (Signed)
 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)   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)   History of DVT (deep vein thrombosis)   History of pulmonary embolism   Psoriatic arthritis [facility])   Assessment: 73 year old male with past medical history of diabetic foot ulcer, PE on Xarelto, psoriatic arthritis on methotrexate/Cosentyx admitted with chills found to have #Strep G bacteremia secondary to right toe ulcer with cellulitis #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 Patient is followed by podiatry for diabetic ulcer of toe - Blood cultures from admission grew 2/2 group B strep.  CT abdomen pelvis showed cholelithiasis.  Chest x-ray no active disease.  X-ray of right foot showed soft tissue ulceration. - Noted to have erythema at right toe.  Patient denies any injury to that region. - Will obtain imaging he also reports some right ankle pain as well will get imaging of ankle.  Recommendations:  -Repeat blood cultures to ensure clearance - Continue ceftriaxone - TTE, will need TEE - MRI right foot - X-ray l right ankle - Hold methotrexate. Reached out to Dr. Shawnee Knapp at [facility] rheumatology in regards to holding methotrexate at this point. Per office pt is still on cosentyx next infusion on 11/17/23 #Chronic heart failure #Diabetes mellitus insulin-dependent #DVT/PE on Xarelto #Cholelithiasis - General Surgery follow-up outpatient for elective cholecystectomy Evaluation of this patient requires  complex antimicrobial therapy evaluation and counseling + isolation needs for disease transmission risk assessment and mitigation   Microbiology:   Antibiotics: Vancomycin 3/18-present Cefepime 3/18 Ceftriaxone 3/19-present Metronidazole 3/18-19  Cultures: Blood 3/18 2/2 group G strep Urine  Other   HPI: Anthony Case is a 73 y.o. male with past medical history significant for hypertension, diabetes mellitus on insulin, OSA, PE on Xarelto, psoriatic arthritis on methotrexate, PE on Xarelto, heart failure, recent admission for diabetic foot ulcer presented with shaking chills and severe right groin pain.  He had gone for a walk yesterday with his dog started having chills.  He has been seeing podiatry for his foot wound, last seen about 2-1/2 weeks ago.  On arrival, he had a temp of 103.1, WBC 18K.  Blood cultures grew 2/2 group B strep.  ID engaged.   Review of Systems: Review of Systems  All other systems reviewed and are negative.   Past Medical History:  Diagnosis Date   Arthritis    Diabetes mellitus    Hx pulmonary embolism 08/26/2007   required emergent tPA treatment   Hypertension    Psoriatic arthritis (HCC) 09/27/2023   Shortness of breath    Sleep apnea     Social History   Tobacco Use   Smoking status: Never   Smokeless tobacco: Never  Vaping Use   Vaping status: Never Used  Substance Use Topics   Alcohol use: Not Currently    Comment: Previously drinking 3-5 drinks nightly x 20 year   Drug use: No    Family History  Problem Relation Age of Onset   Heart  disease Mother    Schizophrenia Mother    Heart disease Father    Diabetes Father    Heart disease Brother    Scheduled Meds:  atorvastatin  20 mg Oral Daily   cholecalciferol  1,000 Units Oral Daily   ezetimibe  10 mg Oral Daily   folic acid  1 mg Oral Daily   gabapentin  100 mg Oral TID   insulin aspart  0-5 Units Subcutaneous QHS   insulin aspart  0-6 Units Subcutaneous TID WC    insulin glargine  35 Units Subcutaneous QHS   metoprolol succinate  50 mg Oral Daily   rivaroxaban  20 mg Oral QHS   sacubitril-valsartan  1 tablet Oral BID   sodium chloride flush  3 mL Intravenous Q12H   Continuous Infusions:  cefTRIAXone (ROCEPHIN)  IV 2 g (11/12/23 1301)   PRN Meds:.acetaminophen **OR** acetaminophen, fentaNYL (SUBLIMAZE) injection, mouth rinse, oxyCODONE, senna-docusate Allergies  Allergen Reactions   Ace Inhibitors Other (See Comments)   Lisinopril Cough    OBJECTIVE: Blood pressure 131/78, pulse 66, temperature 97.8 F (36.6 C), temperature source Oral, resp. rate 19, height 5\' 11"  (1.803 m), weight 117.9 kg, SpO2 93%.  Physical Exam Constitutional:      General: He is not in acute distress.    Appearance: He is normal weight. He is not toxic-appearing.  HENT:     Head: Normocephalic and atraumatic.     Right Ear: External ear normal.     Left Ear: External ear normal.     Nose: No congestion or rhinorrhea.     Mouth/Throat:     Mouth: Mucous membranes are moist.     Pharynx: Oropharynx is clear.  Eyes:     Extraocular Movements: Extraocular movements intact.     Conjunctiva/sclera: Conjunctivae normal.     Pupils: Pupils are equal, round, and reactive to light.  Cardiovascular:     Rate and Rhythm: Normal rate and regular rhythm.     Heart sounds: No murmur heard.    No friction rub. No gallop.  Pulmonary:     Effort: Pulmonary effort is normal.     Breath sounds: Normal breath sounds.  Abdominal:     General: Abdomen is flat. Bowel sounds are normal.     Palpations: Abdomen is soft.  Musculoskeletal:        General: No swelling.     Cervical back: Normal range of motion and neck supple.  Skin:    General: Skin is warm and dry.  Neurological:     General: No focal deficit present.     Mental Status: He is oriented to person, place, and time.  Psychiatric:        Mood and Affect: Mood normal.        Lab Results Lab Results   Component Value Date   WBC 12.0 (H) 11/12/2023   HGB 12.0 (L) 11/12/2023   HCT 37.1 (L) 11/12/2023   MCV 93.0 11/12/2023   PLT 174 11/12/2023    Lab Results  Component Value Date   CREATININE 1.24 11/12/2023   BUN 24 (H) 11/12/2023   NA 133 (L) 11/12/2023   K 3.9 11/12/2023   CL 108 11/12/2023   CO2 22 11/12/2023    Lab Results  Component Value Date   ALT 20 11/11/2023   AST 23 11/11/2023   ALKPHOS 30 (L) 11/11/2023   BILITOT 1.8 (H) 11/11/2023       Danelle Earthly, MD [facility]  [facility] 11/12/2023, 1:08 PM

## 2023-11-12 NOTE — TOC CM/SW Note (Signed)
 Transition of Care [facility]) - Inpatient Brief Assessment   Patient Details  Name: AYVEN PHEASANT MRN: [id number] Date of Birth: December 01, 1950  [facility]) CM/SW Contact:    Leone Haven, RN Phone Number: 11/12/2023, 3:34 PM   Clinical Narrative: From home with spouse, has PCP , Ivery Quale and insurance on file, states has no HH services in place at this time , has cane and a cpap machine at home.  States wife will transport them home at [employer] and she is support system, states gets medications from [place] [facility] [place].  Pta self ambulatory with cane occasionally.   Transition of Care Asessment: Insurance and Status: Insurance coverage has been reviewed Patient has primary care physician: Yes Ivery Quale) Home environment has been reviewed: home wiht wife Prior level of function:: indep with cane occasionally Prior/Current Home Services: Current home services (cane, cpap machine) Social Drivers of Health Review: SDOH reviewed no interventions necessary Readmission risk has been reviewed: Yes Transition of care needs: no transition of care needs at this time

## 2023-11-12 NOTE — Progress Notes (Signed)
 PROGRESS NOTE    Anthony Case  ZOX:[id number] DOB: [birth year] DOA: 11/10/2023 PCP: Garlan Fillers, MD   Brief Narrative:  HPI: Anthony Case is a 72 y.o. male with medical history significant for hypertension, insulin-dependent diabetes mellitus, OSA, history of PE on Xarelto, HFmrEF, psoriatic arthritis, and admission last month for diabetic foot ulcer, now presenting with shaking chills and severe right groin pain.   Patient reports that he was in his usual state of health yesterday when he took his dog for a walk, but developed shaking chills and severe pain in the right groin shortly after this.  The pain eventually eased off but he continues to have intermittent chills, prompting his presentation.  The right great toe ulcer has been improving per patient report.  He denies abdominal pain or diarrhea but did have 1 episode of vomiting today.  He denies dysuria, cough, or shortness of breath.   ED Course: Upon arrival to the ED, patient is found to be febrile to 39.5 C and saturating mid 90s on room air with tachycardia and hypotension.  EKG demonstrates sinus tachycardia with LBBB and prolonged QT interval.  Chest x-ray negative for acute cardiopulmonary disease and no acute findings seen on plain radiographs of the right foot.  CT of the abdomen pelvis notable for mild right inguinal adenopathy.  Labs are most remarkable for WBC 13,500, negative respiratory virus panel, and lactic acid 2.3.   Blood cultures were collected in the ED and the patient was given 2 L of LR, cefepime, and Flagyl.  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  Patient received 2 L of IV fluid bolus in the ED, lactic acidosis resolved.  Still has leukocytosis and persistent fever.  CT abdomen and pelvis shows right inguinal adenopathy, suspect he is bacteremic from skin/soft tissue infection involving RLE.  Right foot x-ray does not show any osteomyelitis signs clinically either.  Started on broad-spectrum antibiotics with the suspicion of possible bacteremia and eventually grew  Streptococcus and 3/4 bottles of the blood culture, antibiotics switched from cefepime/vancomycin/Flagyl to Rocephin 2 g daily.  Awaiting final identification and culture report.  Leukocytosis improving, patient afebrile.  Will get transthoracic echo.  I have requested ID to consult.  Patient has a history of fusion at T9 L1 with some endplate/screws.  However he denies any back pain.   Chronic HFmrEF  - Appears compensated, now that blood pressure is slightly better, will resume Entresto and Toprol-XL.   Insulin-dependent DM  - A1c was 6.0% in February 2025.  Appears to be taking 48 units of long-acting insulin, metformin and glipizide as well as Ozempic PTA.  Hypoglycemic this morning.  Hold all oral home medications.  Continue Semglee 35 units and SSI.   Hx of DVT/PE  - Xarelto     Psoriatic arthritis  - Managed with methotrexate and Cosentyx and we will hold those due to active infection.  Hyperlipidemia: Continue statin.  Hyperbilirubinemia: Slightly elevated.  Could be due to severe sepsis.  CT abdomen did not show  any liver pathology.  Hypomagnesemia: Resolved.  Cholelithiasis: Needs follow-up with general surgery as outpatient for elective cholecystectomy.  DVT prophylaxis: Xarelto   Code Status: Full Code  Family Communication:  None present at bedside.  Plan of care discussed with patient in length and he/she verbalized  understanding and agreed with it.  Status is: Inpatient Remains inpatient appropriate because: Awaiting final culture reports.   Estimated body mass index is 36.25 kg/m as calculated from the following:   Height as of this encounter: 5\' 11"  (1.803 m).   Weight as of this encounter: 117.9 kg.    Nutritional Assessment: Body mass index is 36.25 kg/m.Marland Kitchen Seen by dietician.  I agree with the assessment and plan as outlined below: Nutrition Status:        . Skin Assessment: I have examined the patient's skin and I agree with the wound assessment as performed by the wound care RN as outlined below:    Consultants:  None  Procedures:  None  Antimicrobials:  Anti-infectives (From admission, onward)    Start     Dose/Rate Route Frequency Ordered Stop   11/12/23 0500  vancomycin (VANCOREADY) IVPB 1500 mg/300 mL  Status:  Discontinued        1,500 mg 150 mL/hr over 120 Minutes Intravenous Every 24 hours 11/11/23 0449 11/11/23 1033   11/11/23 1300  cefTRIAXone (ROCEPHIN) 2 g in sodium chloride 0.9 % 100 mL IVPB        2 g 200 mL/hr over 30 Minutes Intravenous Every 24 hours 11/11/23 1033     11/11/23 1000  metroNIDAZOLE (FLAGYL) IVPB 500 mg  Status:  Discontinued        500 mg 100 mL/hr over 60 Minutes Intravenous Every 12 hours 11/11/23 0221 11/11/23 1033   11/11/23 0600  ceFEPIme (MAXIPIME) 2 g in sodium chloride 0.9 % 100 mL IVPB  Status:  Discontinued        2 g 200 mL/hr over 30 Minutes Intravenous Every 8 hours 11/11/23 0248 11/11/23 1033   11/11/23 0230  vancomycin (VANCOCIN) IVPB 1000 mg/200 mL premix  Status:  Discontinued        1,000 mg 200 mL/hr over 60 Minutes Intravenous  Once 11/11/23 0221 11/11/23 0229   11/11/23 0230  vancomycin (VANCOREADY) IVPB 2000 mg/400 mL        2,000 mg 200 mL/hr over 120 Minutes Intravenous  Once 11/11/23 0229 11/11/23 0446   11/10/23 2230  ceFEPIme (MAXIPIME) 2 g in sodium chloride 0.9 % 100 mL IVPB        2 g 200 mL/hr over 30  Minutes Intravenous  Once 11/10/23 2220 11/10/23 2301   11/10/23 2230  metroNIDAZOLE (FLAGYL) IVPB 500 mg        500 mg 100 mL/hr over 60 Minutes Intravenous  Once 11/10/23 2220 11/10/23 2350         Subjective: Patient seen and examined.  He says that he is feeling better.  No complaints at all.  Objective: Vitals:   11/11/23 1953 11/11/23 2000 11/12/23 0056 11/12/23 0442  BP:  (!) 107/57 111/60 122/65  Pulse:  96 79 71  Resp: (!) 22 16 16 17   Temp:  99.8 F (37.7 C) 100 F (37.8 C) 98 F (36.7 C)  TempSrc:  Oral Oral Oral  SpO2:  90% 95% 91%  Weight:    117.9 kg  Height:        Intake/Output Summary (Last 24 hours) at 11/12/2023 0981 Last data filed at 11/11/2023 1340  Gross per 24 hour  Intake 250.68 ml  Output --  Net 250.68 ml   Filed Weights   11/10/23 2058 11/11/23 1900 11/12/23 0442  Weight: 117 kg 118.9 kg 117.9 kg    Examination:  General exam: Appears calm and comfortable  Respiratory system: Clear to auscultation. Respiratory effort normal. Cardiovascular system: S1 & S2 heard, RRR. No JVD, murmurs, rubs, gallops or clicks. No pedal edema. Gastrointestinal system: Abdomen is nondistended, soft and nontender. No organomegaly or masses felt. Normal bowel sounds heard. Central nervous system: Alert and oriented. No focal neurological deficits. Extremities: Symmetric 5 x 5 power. Skin: Right great toe wound is healing appropriately.  No signs of active infection, no warmth or erythema. Psychiatry: Judgement and insight appear normal. Mood & affect appropriate.    Data Reviewed: I have personally reviewed following labs and imaging studies  CBC: Recent Labs  Lab 11/10/23 2109 11/11/23 0244 11/11/23 0432 11/12/23 0521  WBC 13.5* 18.3* 17.6* 12.0*  NEUTROABS 12.0* 16.1*  --   --   HGB 14.1 13.5 12.9* 12.0*  HCT 42.2 39.8 38.7* 37.1*  MCV 92.1 92.6 93.0 93.0  PLT 200 186 166 174   Basic Metabolic Panel: Recent Labs  Lab 11/10/23 2109  11/11/23 0432 11/11/23 1910 11/12/23 0521  NA 140 137  --  133*  K 4.4 4.0  --  3.9  CL 106 107  --  108  CO2 22 21*  --  22  GLUCOSE 126* 65*  --  138*  BUN 22 21  --  24*  CREATININE 1.19 1.17  --  1.24  CALCIUM 9.8 8.8*  --  8.5*  MG  --  1.3* 2.0 2.1   GFR: Estimated Creatinine Clearance: 69.3 mL/min (by C-G formula based on SCr of 1.24 mg/dL). Liver Function Tests: Recent Labs  Lab 11/10/23 2109 11/11/23 0432  AST 27 23  ALT 24 20  ALKPHOS 45 30*  BILITOT 1.6* 1.8*  PROT 6.5 5.2*  ALBUMIN 3.5 2.8*   No results for input(s): "LIPASE", "AMYLASE" in the last 168 hours. No results for input(s): "AMMONIA" in the last 168 hours. Coagulation Profile: Recent Labs  Lab 11/10/23 2109  INR 2.1*   Cardiac Enzymes: No results for input(s): "CKTOTAL", "CKMB", "CKMBINDEX", "TROPONINI" in the last 168 hours. BNP (last 3 results) No results for input(s): "PROBNP" in the last 8760 hours. HbA1C: No results for input(s): "HGBA1C" in the last 72 hours. CBG: Recent Labs  Lab 11/11/23 1213 11/11/23 1358 11/11/23 1758 11/11/23 2109 11/12/23 0620  GLUCAP 51* 106* 90 124* 151*   Lipid Profile: No results for input(s): "CHOL", "HDL", "LDLCALC", "TRIG", "CHOLHDL", "LDLDIRECT" in the last 72 hours. Thyroid Function Tests: No results for input(s): "TSH", "T4TOTAL", "FREET4", "T3FREE", "THYROIDAB" in the last 72 hours. Anemia Panel: No results for input(s): "VITAMINB12", "FOLATE", "FERRITIN", "TIBC", "IRON", "RETICCTPCT" in the last 72 hours. Sepsis Labs: Recent Labs  Lab 11/10/23 2105 11/10/23 2141 11/10/23 2333 11/11/23 0432  LATICACIDVEN 1.9 2.2* 2.3* 1.3    Recent Results (from the past 240 hours)  Blood Culture (routine x 2)     Status: Abnormal (Preliminary result)   Collection Time: 11/10/23  9:09 PM   Specimen: BLOOD LEFT FOREARM  Result Value Ref Range Status   Specimen Description BLOOD LEFT FOREARM  Final   Special Requests   Final    BOTTLES DRAWN AEROBIC  AND ANAEROBIC Blood Culture adequate volume   Culture  Setup Time   Final    GRAM  POSITIVE COCCI IN CHAINS IN BOTH AEROBIC AND ANAEROBIC BOTTLES CRITICAL RESULT CALLED TO, READ BACK BY AND VERIFIED WITH: E. SINCLAIR PHARMD, AT 1025 11/11/23 D. VANHOOK    Culture (A)  Final    STREPTOCOCCUS GROUP G SUSCEPTIBILITIES TO FOLLOW Performed at [facility], [address] [place], [place] [address]    Report Status PENDING  Incomplete  Blood Culture ID Panel (Reflexed)     Status: Abnormal   Collection Time: 11/10/23  9:09 PM  Result Value Ref Range Status   Enterococcus faecalis NOT DETECTED NOT DETECTED Final   Enterococcus Faecium NOT DETECTED NOT DETECTED Final   Listeria monocytogenes NOT DETECTED NOT DETECTED Final   Staphylococcus species NOT DETECTED NOT DETECTED Final   Staphylococcus aureus (BCID) NOT DETECTED NOT DETECTED Final   Staphylococcus epidermidis NOT DETECTED NOT DETECTED Final   Staphylococcus lugdunensis NOT DETECTED NOT DETECTED Final   Streptococcus species DETECTED (A) NOT DETECTED Final    Comment: Not Enterococcus species, Streptococcus agalactiae, Streptococcus pyogenes, or Streptococcus pneumoniae. CRITICAL RESULT CALLED TO, READ BACK BY AND VERIFIED WITH: E. SINCLAIR PHARMD, AT 1025 11/11/23 D. VANHOOK    Streptococcus agalactiae NOT DETECTED NOT DETECTED Final   Streptococcus pneumoniae NOT DETECTED NOT DETECTED Final   Streptococcus pyogenes NOT DETECTED NOT DETECTED Final   A.calcoaceticus-baumannii NOT DETECTED NOT DETECTED Final   Bacteroides fragilis NOT DETECTED NOT DETECTED Final   Enterobacterales NOT DETECTED NOT DETECTED Final   Enterobacter cloacae complex NOT DETECTED NOT DETECTED Final   Escherichia coli NOT DETECTED NOT DETECTED Final   Klebsiella aerogenes NOT DETECTED NOT DETECTED Final   Klebsiella oxytoca NOT DETECTED NOT DETECTED Final   Klebsiella pneumoniae NOT DETECTED NOT DETECTED Final   Proteus species NOT DETECTED NOT  DETECTED Final   Salmonella species NOT DETECTED NOT DETECTED Final   Serratia marcescens NOT DETECTED NOT DETECTED Final   Haemophilus influenzae NOT DETECTED NOT DETECTED Final   Neisseria meningitidis NOT DETECTED NOT DETECTED Final   Pseudomonas aeruginosa NOT DETECTED NOT DETECTED Final   Stenotrophomonas maltophilia NOT DETECTED NOT DETECTED Final   Candida albicans NOT DETECTED NOT DETECTED Final   Candida auris NOT DETECTED NOT DETECTED Final   Candida glabrata NOT DETECTED NOT DETECTED Final   Candida krusei NOT DETECTED NOT DETECTED Final   Candida parapsilosis NOT DETECTED NOT DETECTED Final   Candida tropicalis NOT DETECTED NOT DETECTED Final   Cryptococcus neoformans/gattii NOT DETECTED NOT DETECTED Final    Comment: Performed at [facility], [address] [place], [place] [address]  Blood Culture (routine x 2)     Status: Abnormal (Preliminary result)   Collection Time: 11/10/23  9:20 PM   Specimen: BLOOD RIGHT FOREARM  Result Value Ref Range Status   Specimen Description BLOOD RIGHT FOREARM  Final   Special Requests   Final    BOTTLES DRAWN AEROBIC AND ANAEROBIC Blood Culture adequate volume   Culture  Setup Time   Final    GRAM POSITIVE COCCI IN CHAINS IN BOTH AEROBIC AND ANAEROBIC BOTTLES CRITICAL RESULT CALLED TO, READ BACK BY AND VERIFIED WITH: E. SINCLAIR PHARMD, AT 1025 11/11/23 D. VANHOOK Performed at [facility], [address] [place], [place] [address]    Culture STREPTOCOCCUS GROUP G (A)  Final   Report Status PENDING  Incomplete  Resp panel by RT-PCR (RSV, Flu A&B, Covid) Anterior Nasal Swab     Status: None   Collection Time: 11/10/23 11:21 PM   Specimen:  Anterior Nasal Swab  Result Value Ref Range Status   SARS Coronavirus 2 by RT PCR NEGATIVE NEGATIVE Final   Influenza A by PCR NEGATIVE NEGATIVE Final   Influenza B by PCR NEGATIVE NEGATIVE Final    Comment: (NOTE) The Xpert Xpress SARS-CoV-2/FLU/RSV plus assay is intended as an  aid in the diagnosis of influenza from Nasopharyngeal swab specimens and should not be used as a sole basis for treatment. Nasal washings and aspirates are unacceptable for Xpert Xpress SARS-CoV-2/FLU/RSV testing.  Fact Sheet for Patients: BloggerCourse.com  Fact Sheet for Healthcare Providers: SeriousBroker.it  This test is not yet approved or cleared by the [place] FDA and has been authorized for detection and/or diagnosis of SARS-CoV-2 by FDA under an Emergency Use Authorization (EUA). This EUA will remain in effect (meaning this test can be used) for the duration of the COVID-19 declaration under Section 564(b)(1) of the Act, 21 U.S.C. section 360bbb-3(b)(1), unless the authorization is terminated or revoked.     Resp Syncytial Virus by PCR NEGATIVE NEGATIVE Final    Comment: (NOTE) Fact Sheet for Patients: BloggerCourse.com  Fact Sheet for Healthcare Providers: SeriousBroker.it  This test is not yet approved or cleared by the [place] FDA and has been authorized for detection and/or diagnosis of SARS-CoV-2 by FDA under an Emergency Use Authorization (EUA). This EUA will remain in effect (meaning this test can be used) for the duration of the COVID-19 declaration under Section 564(b)(1) of the Act, 21 U.S.C. section 360bbb-3(b)(1), unless the authorization is terminated or revoked.  Performed at [facility], [address], [place], [place] [address]      Radiology Studies: DG Foot Complete Right Result Date: 11/11/2023 CLINICAL DATA:  Right great toe wound. EXAM: RIGHT FOOT COMPLETE - 3+ VIEW COMPARISON:  None Available. FINDINGS: There is no evidence of an acute fracture or dislocation. A chronic appearing deformity is seen involving the large plantar calcaneal spur. Degenerative changes are seen involving the dorsal aspect of the mid right foot and  multiple interphalangeal joints. A small soft tissue ulceration is seen along the medial aspect of the right great toe. IMPRESSION: 1. Small soft tissue ulceration along the medial aspect of the right great toe. 2. No acute osseous abnormality. 3. Degenerative changes, as described above. Electronically Signed   By: Aram Candela M.D.   On: 11/11/2023 01:44   CT ABDOMEN PELVIS W CONTRAST Result Date: 11/11/2023 CLINICAL DATA:  Chills with right foot pain that radiates up to the groin. EXAM: CT ABDOMEN AND PELVIS WITH CONTRAST TECHNIQUE: Multidetector CT imaging of the abdomen and pelvis was performed using the standard protocol following bolus administration of intravenous contrast. RADIATION DOSE REDUCTION: This exam was performed according to the departmental dose-optimization program which includes automated exposure control, adjustment of the mA and/or kV according to patient size and/or use of iterative reconstruction technique. CONTRAST:  OMNIPAQUE IOHEXOL 350 MG/ML SOLN COMPARISON:  May 23, 2021 FINDINGS: Lower chest: No acute abnormality. Hepatobiliary: No focal liver abnormality is seen. A subcentimeter low-attenuation gallstone is seen within the dependent portion of the gallbladder lumen (axial CT image 25, CT series 3), without evidence of gallbladder wall thickening or biliary dilatation. Pancreas: Unremarkable. No pancreatic ductal dilatation or surrounding inflammatory changes. Spleen: Normal in size without focal abnormality. Adrenals/Urinary Tract: Adrenal glands are unremarkable. Kidneys are normal in size. Small parapelvic renal cysts are seen on the right. 2 mm and 3 mm nonobstructing renal calculi are noted within the right kidney.  There is no evidence of obstructing renal calculi or hydronephrosis. Bladder is unremarkable. Stomach/Bowel: Stomach is within normal limits. Appendix appears normal. No evidence of bowel wall thickening, distention, or inflammatory changes.  Vascular/Lymphatic: Aortic atherosclerosis. There is mild right inguinal lymphadenopathy. Reproductive: The prostate gland is moderately enlarged. Other: No abdominal wall hernia or abnormality. No abdominopelvic ascites. Musculoskeletal: Chronic, degenerative and postoperative changes are seen throughout the thoracolumbar spine. IMPRESSION: 1. Cholelithiasis. 2. Subcentimeter nonobstructing right renal calculi. 3. Mild right inguinal lymphadenopathy. 4. Moderate enlargement of the prostate gland. 5. Aortic atherosclerosis. Aortic Atherosclerosis (ICD10-I70.0). Electronically Signed   By: Aram Candela M.D.   On: 11/11/2023 00:12   DG Chest Port 1 View Result Date: 11/10/2023 CLINICAL DATA:  Questionable sepsis. EXAM: PORTABLE CHEST 1 VIEW COMPARISON:  September 27, 2023 FINDINGS: The heart size and mediastinal contours are within normal limits. There is no evidence of an acute infiltrate, pleural effusion or pneumothorax. Multilevel degenerative changes and postoperative changes are seen involving the thoracic spine. IMPRESSION: No active cardiopulmonary disease. Electronically Signed   By: Aram Candela M.D.   On: 11/10/2023 22:07    Scheduled Meds:  atorvastatin  20 mg Oral Daily   cholecalciferol  1,000 Units Oral Daily   ezetimibe  10 mg Oral Daily   folic acid  1 mg Oral Daily   gabapentin  100 mg Oral TID   insulin aspart  0-5 Units Subcutaneous QHS   insulin aspart  0-6 Units Subcutaneous TID WC   insulin glargine  35 Units Subcutaneous QHS   metoprolol succinate  50 mg Oral Daily   rivaroxaban  20 mg Oral QHS   sacubitril-valsartan  1 tablet Oral BID   sodium chloride flush  3 mL Intravenous Q12H   Continuous Infusions:  cefTRIAXone (ROCEPHIN)  IV Stopped (11/11/23 1340)     LOS: 1 day   Hughie Closs, MD [facility]  11/12/2023, 8:22 AM   *Please note that this is a verbal dictation therefore any spelling or grammatical errors are due to the "Dragon Medical One"  system interpretation.  Please page via Amion and do not message via secure chat for urgent patient care matters. Secure chat can be used for non urgent patient care matters.  How to contact the [facility] Attending or Consulting provider 7A - 7P or covering provider during after hours 7P -7A, for this patient?  Check the care team in [facility] and look for a) attending/consulting [facility] provider listed and b) the [facility] team listed. Page or secure chat 7A-7P. Log into [email or web address] and use [place]'s universal password to access. If you do not have the password, please contact the hospital operator. Locate the [facility] provider you are looking for under [facility] and page to a number that you can be directly reached. If you still have difficulty reaching the provider, please page the [facility] (Director on Call) for the Hospitalists listed on amion for assistance.

## 2023-11-12 NOTE — Consult Note (Signed)
 WOC Nurse Consult Note: Reason for Consult: Requested to assess a great toe wound. Wound type: DFU Pressure Injury POA: NA Measurement: 1.2 cm x 1 cm Wound bed: 80% red, 20% slough yellow. Drainage (amount, consistency, odor) Minumum amount. Periwound: intact Dressing procedure/placement/frequency: Apply Aquacel on the wound bed (#[id number]), cover with foam dressing. Change every 3 days or PRN. Pt is follow by a podiatric, next consult on next Monday.  WOC team will not plan to follow further.  Please reconsult if further assistance is needed. Thank-you,  Denyse Amass BSN, RN, [employer], WOC  (Pager: [phone])

## 2023-11-12 NOTE — Plan of Care (Signed)
 Problem: Education: Goal: Ability to describe self-care measures that may prevent or decrease complications (Diabetes Survival Skills Education) will improve 11/12/2023 0422 by Rockney Ghee, RN Outcome: Progressing 11/12/2023 0422 by Rockney Ghee, RN Outcome: Not Progressing 11/12/2023 0419 by Rockney Ghee, RN Outcome: Progressing Goal: Individualized Educational Video(s) 11/12/2023 0422 by Rockney Ghee, RN Outcome: Progressing 11/12/2023 0422 by Rockney Ghee, RN Outcome: Not Progressing 11/12/2023 0419 by Rockney Ghee, RN Outcome: Progressing   Problem: Coping: Goal: Ability to adjust to condition or change in health will improve 11/12/2023 0422 by Tianne Plott, Levonne Lapping, RN Outcome: Progressing 11/12/2023 0422 by Rockney Ghee, RN Outcome: Not Progressing 11/12/2023 0419 by Rockney Ghee, RN Outcome: Progressing   Problem: Fluid Volume: Goal: Ability to maintain a balanced intake and output will improve 11/12/2023 0422 by Rockney Ghee, RN Outcome: Progressing 11/12/2023 0422 by Rockney Ghee, RN Outcome: Not Progressing 11/12/2023 0419 by Rockney Ghee, RN Outcome: Progressing   Problem: Health Behavior/Discharge Planning: Goal: Ability to identify and utilize available resources and services will improve 11/12/2023 0422 by Anniebelle Devore, Levonne Lapping, RN Outcome: Progressing 11/12/2023 0422 by Rockney Ghee, RN Outcome: Not Progressing 11/12/2023 0419 by Rockney Ghee, RN Outcome: Progressing Goal: Ability to manage health-related needs will improve 11/12/2023 0422 by Monya Kozakiewicz, Levonne Lapping, RN Outcome: Progressing 11/12/2023 0422 by Rockney Ghee, RN Outcome: Not Progressing 11/12/2023 0419 by Rockney Ghee, RN Outcome: Progressing   Problem: Metabolic: Goal: Ability to maintain appropriate glucose levels will improve 11/12/2023 0422 by Rockney Ghee,  RN Outcome: Progressing 11/12/2023 0422 by Rockney Ghee, RN Outcome: Not Progressing 11/12/2023 0419 by Rockney Ghee, RN Outcome: Progressing   Problem: Nutritional: Goal: Maintenance of adequate nutrition will improve 11/12/2023 0422 by Rockney Ghee, RN Outcome: Progressing 11/12/2023 0422 by Rockney Ghee, RN Outcome: Not Progressing 11/12/2023 0419 by Rockney Ghee, RN Outcome: Progressing Goal: Progress toward achieving an optimal weight will improve 11/12/2023 0422 by Rockney Ghee, RN Outcome: Progressing 11/12/2023 0422 by Rockney Ghee, RN Outcome: Not Progressing 11/12/2023 0419 by Rockney Ghee, RN Outcome: Progressing   Problem: Skin Integrity: Goal: Risk for impaired skin integrity will decrease 11/12/2023 0422 by Rockney Ghee, RN Outcome: Progressing 11/12/2023 0422 by Rockney Ghee, RN Outcome: Not Progressing 11/12/2023 0419 by Rockney Ghee, RN Outcome: Progressing   Problem: Tissue Perfusion: Goal: Adequacy of tissue perfusion will improve 11/12/2023 0422 by Rockney Ghee, RN Outcome: Progressing 11/12/2023 0422 by Rockney Ghee, RN Outcome: Not Progressing 11/12/2023 0419 by Rockney Ghee, RN Outcome: Progressing   Problem: Fluid Volume: Goal: Hemodynamic stability will improve 11/12/2023 0422 by Rockney Ghee, RN Outcome: Progressing 11/12/2023 0422 by Rockney Ghee, RN Outcome: Not Progressing 11/12/2023 0419 by Rockney Ghee, RN Outcome: Progressing   Problem: Clinical Measurements: Goal: Diagnostic test results will improve 11/12/2023 0422 by Rockney Ghee, RN Outcome: Progressing 11/12/2023 0422 by Rockney Ghee, RN Outcome: Not Progressing 11/12/2023 0419 by Rockney Ghee, RN Outcome: Progressing Goal: Signs and symptoms of infection will decrease 11/12/2023 0422 by Rockney Ghee,  RN Outcome: Progressing 11/12/2023 0422 by Rockney Ghee, RN Outcome: Not Progressing 11/12/2023 0419 by Rockney Ghee, RN Outcome: Progressing   Problem: Respiratory: Goal: Ability to maintain adequate ventilation will improve 11/12/2023 0422 by Rockney Ghee, RN Outcome: Progressing 11/12/2023 0422 by Rockney Ghee, RN Outcome: Not Progressing 11/12/2023 0419 by Rockney Ghee,  RN Outcome: Progressing   Problem: Education: Goal: Knowledge of General Education information will improve Description: Including pain rating scale, medication(s)/side effects and non-pharmacologic comfort measures 11/12/2023 0422 by Rockney Ghee, RN Outcome: Progressing 11/12/2023 0422 by Rockney Ghee, RN Outcome: Not Progressing 11/12/2023 0419 by Rockney Ghee, RN Outcome: Not Progressing   Problem: Health Behavior/Discharge Planning: Goal: Ability to manage health-related needs will improve 11/12/2023 0422 by Jaana Brodt, Levonne Lapping, RN Outcome: Progressing 11/12/2023 0422 by Rockney Ghee, RN Outcome: Not Progressing 11/12/2023 0419 by Rockney Ghee, RN Outcome: Not Progressing   Problem: Clinical Measurements: Goal: Ability to maintain clinical measurements within normal limits will improve 11/12/2023 0422 by Xadrian Craighead, Levonne Lapping, RN Outcome: Progressing 11/12/2023 0422 by Rockney Ghee, RN Outcome: Not Progressing 11/12/2023 0419 by Rockney Ghee, RN Outcome: Not Progressing Goal: Will remain free from infection 11/12/2023 0422 by Rockney Ghee, RN Outcome: Progressing 11/12/2023 0422 by Rockney Ghee, RN Outcome: Not Progressing 11/12/2023 0419 by Rockney Ghee, RN Outcome: Not Progressing Goal: Diagnostic test results will improve 11/12/2023 0422 by Rockney Ghee, RN Outcome: Progressing 11/12/2023 0422 by Rockney Ghee, RN Outcome: Not Progressing 11/12/2023  0419 by Rockney Ghee, RN Outcome: Not Progressing Goal: Respiratory complications will improve 11/12/2023 0422 by Rockney Ghee, RN Outcome: Progressing 11/12/2023 0422 by Rockney Ghee, RN Outcome: Not Progressing 11/12/2023 0419 by Rockney Ghee, RN Outcome: Not Progressing Goal: Cardiovascular complication will be avoided 11/12/2023 0422 by Rockney Ghee, RN Outcome: Progressing 11/12/2023 0422 by Rockney Ghee, RN Outcome: Not Progressing 11/12/2023 0419 by Rockney Ghee, RN Outcome: Not Progressing   Problem: Activity: Goal: Risk for activity intolerance will decrease 11/12/2023 0422 by Jaycey Gens, Levonne Lapping, RN Outcome: Progressing 11/12/2023 0422 by Rockney Ghee, RN Outcome: Not Progressing 11/12/2023 0419 by Rockney Ghee, RN Outcome: Not Progressing   Problem: Nutrition: Goal: Adequate nutrition will be maintained 11/12/2023 0422 by Rockney Ghee, RN Outcome: Progressing 11/12/2023 0422 by Rockney Ghee, RN Outcome: Not Progressing 11/12/2023 0419 by Rockney Ghee, RN Outcome: Not Progressing   Problem: Coping: Goal: Level of anxiety will decrease 11/12/2023 0422 by Jenella Craigie, Levonne Lapping, RN Outcome: Progressing 11/12/2023 0422 by Rockney Ghee, RN Outcome: Not Progressing 11/12/2023 0419 by Rockney Ghee, RN Outcome: Not Progressing   Problem: Elimination: Goal: Will not experience complications related to bowel motility 11/12/2023 0422 by Rockney Ghee, RN Outcome: Progressing 11/12/2023 0422 by Rockney Ghee, RN Outcome: Not Progressing 11/12/2023 0419 by Rockney Ghee, RN Outcome: Not Progressing Goal: Will not experience complications related to urinary retention 11/12/2023 0422 by Kalijah Zeiss, Levonne Lapping, RN Outcome: Progressing 11/12/2023 0422 by Rockney Ghee, RN Outcome: Not Progressing 11/12/2023 0419 by Rockney Ghee, RN Outcome: Not Progressing   Problem: Pain Managment: Goal: General experience of comfort will improve and/or be controlled 11/12/2023 0422 by Rockney Ghee, RN Outcome: Progressing 11/12/2023 0422 by Rockney Ghee, RN Outcome: Not Progressing 11/12/2023 0419 by Rockney Ghee, RN Outcome: Not Progressing   Problem: Safety: Goal: Ability to remain free from injury will improve 11/12/2023 0422 by Linh Hedberg, Levonne Lapping, RN Outcome: Progressing 11/12/2023 0422 by Rockney Ghee, RN Outcome: Not Progressing 11/12/2023 0419 by Rockney Ghee, RN Outcome: Not Progressing   Problem: Skin Integrity: Goal: Risk for impaired skin integrity will decrease 11/12/2023 0422 by Aman Bonet, Levonne Lapping, RN Outcome: Progressing 11/12/2023 0422 by Rockney Ghee, RN Outcome: Not Progressing  11/12/2023 0419 by Rockney Ghee, RN Outcome: Progressing   Problem: Education: Goal: Knowledge of General Education information will improve Description: Including pain rating scale, medication(s)/side effects and non-pharmacologic comfort measures 11/12/2023 0422 by Rockney Ghee, RN Outcome: Progressing 11/12/2023 0422 by Rockney Ghee, RN Outcome: Not Progressing 11/12/2023 0419 by Rockney Ghee, RN Outcome: Not Progressing   Problem: Health Behavior/Discharge Planning: Goal: Ability to manage health-related needs will improve 11/12/2023 0422 by Rayane Gallardo, Levonne Lapping, RN Outcome: Progressing 11/12/2023 0422 by Rockney Ghee, RN Outcome: Not Progressing 11/12/2023 0419 by Rockney Ghee, RN Outcome: Not Progressing   Problem: Clinical Measurements: Goal: Ability to maintain clinical measurements within normal limits will improve 11/12/2023 0422 by Dustan Hyams, Levonne Lapping, RN Outcome: Progressing 11/12/2023 0422 by Rockney Ghee, RN Outcome: Not Progressing 11/12/2023 0419 by Rockney Ghee,  RN Outcome: Not Progressing Goal: Will remain free from infection 11/12/2023 0422 by Rockney Ghee, RN Outcome: Progressing 11/12/2023 0422 by Rockney Ghee, RN Outcome: Not Progressing 11/12/2023 0419 by Rockney Ghee, RN Outcome: Progressing Goal: Diagnostic test results will improve 11/12/2023 0422 by Rockney Ghee, RN Outcome: Progressing 11/12/2023 0422 by Rockney Ghee, RN Outcome: Not Progressing 11/12/2023 0419 by Rockney Ghee, RN Outcome: Progressing Goal: Respiratory complications will improve 11/12/2023 0422 by Rockney Ghee, RN Outcome: Progressing 11/12/2023 0422 by Rockney Ghee, RN Outcome: Not Progressing 11/12/2023 0419 by Rockney Ghee, RN Outcome: Progressing Goal: Cardiovascular complication will be avoided 11/12/2023 0422 by Rockney Ghee, RN Outcome: Progressing 11/12/2023 0422 by Rockney Ghee, RN Outcome: Not Progressing 11/12/2023 0419 by Rockney Ghee, RN Outcome: Progressing   Problem: Activity: Goal: Risk for activity intolerance will decrease 11/12/2023 0422 by Jozeph Persing, Levonne Lapping, RN Outcome: Progressing 11/12/2023 0422 by Rockney Ghee, RN Outcome: Not Progressing 11/12/2023 0419 by Rockney Ghee, RN Outcome: Progressing   Problem: Nutrition: Goal: Adequate nutrition will be maintained 11/12/2023 0422 by Rockney Ghee, RN Outcome: Progressing 11/12/2023 0422 by Rockney Ghee, RN Outcome: Not Progressing 11/12/2023 0419 by Rockney Ghee, RN Outcome: Progressing   Problem: Coping: Goal: Level of anxiety will decrease 11/12/2023 0422 by Rockney Ghee, RN Outcome: Progressing 11/12/2023 0422 by Rockney Ghee, RN Outcome: Not Progressing 11/12/2023 0419 by Rockney Ghee, RN Outcome: Progressing   Problem: Elimination: Goal: Will not experience complications related to bowel motility 11/12/2023  0422 by Rockney Ghee, RN Outcome: Progressing 11/12/2023 0422 by Rockney Ghee, RN Outcome: Not Progressing 11/12/2023 0419 by Rockney Ghee, RN Outcome: Progressing Goal: Will not experience complications related to urinary retention 11/12/2023 0422 by Kalianne Fetting, Levonne Lapping, RN Outcome: Progressing 11/12/2023 0422 by Rockney Ghee, RN Outcome: Not Progressing 11/12/2023 0419 by Rockney Ghee, RN Outcome: Progressing   Problem: Pain Managment: Goal: General experience of comfort will improve and/or be controlled 11/12/2023 0422 by Rockney Ghee, RN Outcome: Progressing 11/12/2023 0422 by Rockney Ghee, RN Outcome: Not Progressing 11/12/2023 0419 by Rockney Ghee, RN Outcome: Not Progressing   Problem: Safety: Goal: Ability to remain free from injury will improve 11/12/2023 0422 by Neena Beecham, Levonne Lapping, RN Outcome: Progressing 11/12/2023 0422 by Rockney Ghee, RN Outcome: Not Progressing 11/12/2023 0419 by Rockney Ghee, RN Outcome: Not Progressing   Problem: Skin Integrity: Goal: Risk for impaired skin integrity will decrease 11/12/2023 0422 by Rockney Ghee, RN Outcome: Progressing 11/12/2023 0422 by Rockney Ghee, RN Outcome: Not Progressing 11/12/2023 0419 by  Gust Eugene, Levonne Lapping, RN Outcome: Not Progressing

## 2023-11-13 ENCOUNTER — Inpatient Hospital Stay (HOSPITAL_COMMUNITY)

## 2023-11-13 DIAGNOSIS — I38 Endocarditis, valve unspecified: Secondary | ICD-10-CM | POA: Diagnosis not present

## 2023-11-13 DIAGNOSIS — L97519 Non-pressure chronic ulcer of other part of right foot with unspecified severity: Secondary | ICD-10-CM | POA: Diagnosis not present

## 2023-11-13 DIAGNOSIS — L03031 Cellulitis of right toe: Secondary | ICD-10-CM | POA: Diagnosis not present

## 2023-11-13 DIAGNOSIS — B954 Other streptococcus as the cause of diseases classified elsewhere: Secondary | ICD-10-CM | POA: Diagnosis not present

## 2023-11-13 DIAGNOSIS — A419 Sepsis, unspecified organism: Secondary | ICD-10-CM | POA: Diagnosis not present

## 2023-11-13 LAB — CBC
HCT: 37.4 % — ABNORMAL LOW (ref 39.0–52.0)
Hemoglobin: 13 g/dL (ref 13.0–17.0)
MCH: 31 pg (ref 26.0–34.0)
MCHC: 34.8 g/dL (ref 30.0–36.0)
MCV: 89.3 fL (ref 80.0–100.0)
Platelets: 187 10*3/uL (ref 150–400)
RBC: 4.19 MIL/uL — ABNORMAL LOW (ref 4.22–5.81)
RDW: 14.6 % (ref 11.5–15.5)
WBC: 12 10*3/uL — ABNORMAL HIGH (ref 4.0–10.5)
nRBC: 0 % (ref 0.0–0.2)

## 2023-11-13 LAB — BASIC METABOLIC PANEL
Anion gap: 8 (ref 5–15)
BUN: 32 mg/dL — ABNORMAL HIGH (ref 8–23)
CO2: 21 mmol/L — ABNORMAL LOW (ref 22–32)
Calcium: 8.9 mg/dL (ref 8.9–10.3)
Chloride: 108 mmol/L (ref 98–111)
Creatinine, Ser: 1.15 mg/dL (ref 0.61–1.24)
GFR, Estimated: >60 mL/min (ref >60)
Glucose, Bld: 212 mg/dL — ABNORMAL HIGH (ref 70–99)
Potassium: 4.3 mmol/L (ref 3.5–5.1)
Sodium: 137 mmol/L (ref 135–145)

## 2023-11-13 LAB — CULTURE, BLOOD (ROUTINE X 2)
Special Requests: ADEQUATE
Special Requests: ADEQUATE

## 2023-11-13 LAB — ECHOCARDIOGRAM COMPLETE
AR max vel: 1.59 cm2
AV Area VTI: 1.4 cm2
AV Area mean vel: 1.4 cm2
AV Mean grad: 10 mmHg
AV Peak grad: 14.1 mmHg
Ao pk vel: 1.88 m/s
Area-P 1/2: 2.19 cm2
Calc EF: 41.2 %
S' Lateral: 3.4 cm
Single Plane A2C EF: 38.1 %
Single Plane A4C EF: 42.7 %

## 2023-11-13 LAB — GLUCOSE, CAPILLARY
Glucose-Capillary: 178 mg/dL — ABNORMAL HIGH (ref 70–99)
Glucose-Capillary: 183 mg/dL — ABNORMAL HIGH (ref 70–99)
Glucose-Capillary: 167 mg/dL — ABNORMAL HIGH (ref 70–99)
Glucose-Capillary: 169 mg/dL — ABNORMAL HIGH (ref 70–99)

## 2023-11-13 MED ORDER — DAPAGLIFLOZIN PROPANEDIOL 10 MG PO TABS
10.0000 mg | ORAL_TABLET | Freq: Every day | ORAL | Status: DC
  Administered 2023-11-13 – 2023-11-16 (×4): 10 mg via ORAL
  Filled 2023-11-13 (×4): qty 1

## 2023-11-13 MED ORDER — PERFLUTREN LIPID MICROSPHERE
1.0000 mL | INTRAVENOUS | Status: AC | PRN
  Administered 2023-11-13: 2 mL via INTRAVENOUS

## 2023-11-13 MED ORDER — INSULIN GLARGINE 100 UNIT/ML ~~LOC~~ SOLN
40.0000 [IU] | Freq: Every day | SUBCUTANEOUS | Status: DC
  Administered 2023-11-13 – 2023-11-15 (×3): 40 [IU] via SUBCUTANEOUS
  Filled 2023-11-13 (×4): qty 0.4

## 2023-11-13 MED ORDER — CEFAZOLIN SODIUM-DEXTROSE 2-4 GM/100ML-% IV SOLN
2.0000 g | Freq: Three times a day (TID) | INTRAVENOUS | Status: DC
  Administered 2023-11-13 – 2023-11-16 (×10): 2 g via INTRAVENOUS
  Filled 2023-11-13 (×10): qty 100

## 2023-11-13 NOTE — Progress Notes (Signed)
    [facility] has been requested to perform a transesophageal echocardiogram on Anthony Case for Strep G bacteremia.  After careful review of history and examination, the risks and benefits of transesophageal echocardiogram have been explained including risks of esophageal damage, perforation (1:10,000 risk), bleeding, pharyngeal hematoma as well as other potential complications associated with anesthesia including aspiration, arrhythmia, respiratory failure and death. Alternatives to treatment were discussed, questions were answered. Patient is willing to proceed.   Patient who presented with R toe ulcer with cellulitis and strep bacteremia. Vital sign stable. Normal hemoglobin and platelet.   [place], [place] 11/13/2023 3:19 PM

## 2023-11-13 NOTE — Progress Notes (Signed)
 PROGRESS NOTE    [name]  OZD:[id number] DOB: Jan 05, 1951 DOA: 11/10/2023 PCP: Garlan Fillers, MD   Brief Narrative:  Anthony Case is a 73 y.o. male with medical history significant for hypertension, insulin-dependent diabetes mellitus, OSA, history of PE on Xarelto, HFmrEF, psoriatic arthritis, and admission last month for diabetic foot ulcer, presented with shaking chills and severe right groin pain that started suddenly a day prior when he took his dog for a walk, but developed shaking chills and severe pain in the right groin shortly after this.  The pain eventually eased off but he continues to have intermittent chills, prompting his presentation.  The right great toe ulcer has been improving per patient report.  No other complaints.   Upon arrival to the ED, patient was febrile to 39.5 C and saturating mid 90s on room air with tachycardia and hypotension.  EKG demonstrates sinus tachycardia with LBBB and prolonged QT interval.  Chest x-ray negative for acute cardiopulmonary disease and no acute findings seen on plain radiographs of the right foot.  CT of the abdomen pelvis notable for mild right inguinal adenopathy.  WBC 13,500, negative respiratory virus panel, and lactic acid 2.3. Blood cultures were collected in the ED and the patient was given 2 L of LR, cefepime, and Flagyl.  Admitted under hospital service with suspicion of bacteremia.  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  Patient received 2 L of IV fluid bolus in the ED, lactic acidosis resolved.  Still has leukocytosis and persistent fever.  CT abdomen and pelvis shows right inguinal adenopathy, suspect he is bacteremic from skin/soft tissue infection involving RLE.  Right foot x-ray does not show any osteomyelitis signs clinically either.  Started on broad-spectrum antibiotics with the suspicion of possible bacteremia and eventually grew group G Streptococcus and 3/4 bottles of the blood culture, antibiotics switched from cefepime/vancomycin/Flagyl to Rocephin 2 g daily.  Awaiting final identification and culture report.  Leukocytosis improving, patient afebrile since admission.  Right foot did not appear to be actively infected so source of bacteremia remained unclear so ID was consulted.  ID obtained right foot MRI which ruled out osteomyelitis or abscess and right ankle x-ray which was unremarkable as well.  Blood cultures repeated 11/12/2023 which are negative.  Transthoracic echo ordered which is pending.  Per ID, may need TEE eventually.  Appreciate ID help.   Chronic HFmrEF  - Appears compensated, continue Entresto and Toprol-XL.   Insulin-dependent DM  - A1c was 6.0% in February 2025.  Appears to be taking 48 units of long-acting insulin, metformin and glipizide as well as Ozempic PTA.  Continue to hold all oral home medications.  Slightly hyperglycemic today.  Increase Semglee to 40 units.   Hx of DVT/PE  - Xarelto     Psoriatic arthritis  - Managed with methotrexate and Cosentyx and we will hold those due to active infection.  Hyperlipidemia: Continue statin.  Hyperbilirubinemia: Slightly elevated.  Could be due to severe sepsis.  CT abdomen did not  show any liver pathology.  Hypomagnesemia: Resolved.  Cholelithiasis: Needs follow-up with general surgery as outpatient for elective cholecystectomy.  DVT prophylaxis: Xarelto   Code Status: Full Code  Family Communication:  None present at  bedside.  Plan of care discussed with patient in length and he/she verbalized understanding and agreed with it.  Status is: Inpatient Remains inpatient appropriate because: Awaiting final culture reports.   Estimated body mass index is 35.93 kg/m as calculated from the following:   Height as of this encounter: 5\' 11"  (1.803 m).   Weight as of this encounter: 116.8 kg.    Nutritional Assessment: Body mass index is 35.93 kg/m.Marland Kitchen Seen by dietician.  I agree with the assessment and plan as outlined below: Nutrition Status:        . Skin Assessment: I have examined the patient's skin and I agree with the wound assessment as performed by the wound care RN as outlined below:    Consultants:  None  Procedures:  None  Antimicrobials:  Anti-infectives (From admission, onward)    Start     Dose/Rate Route Frequency Ordered Stop   11/12/23 0500  vancomycin (VANCOREADY) IVPB 1500 mg/300 mL  Status:  Discontinued        1,500 mg 150 mL/hr over 120 Minutes Intravenous Every 24 hours 11/11/23 0449 11/11/23 1033   11/11/23 1300  cefTRIAXone (ROCEPHIN) 2 g in sodium chloride 0.9 % 100 mL IVPB        2 g 200 mL/hr over 30 Minutes Intravenous Every 24 hours 11/11/23 1033     11/11/23 1000  metroNIDAZOLE (FLAGYL) IVPB 500 mg  Status:  Discontinued        500 mg 100 mL/hr over 60 Minutes Intravenous Every 12 hours 11/11/23 0221 11/11/23 1033   11/11/23 0600  ceFEPIme (MAXIPIME) 2 g in sodium chloride 0.9 % 100 mL IVPB  Status:  Discontinued        2 g 200 mL/hr over 30 Minutes Intravenous Every 8 hours 11/11/23 0248 11/11/23 1033   11/11/23 0230  vancomycin (VANCOCIN) IVPB 1000 mg/200 mL premix  Status:  Discontinued        1,000 mg 200 mL/hr over 60 Minutes Intravenous  Once 11/11/23 0221 11/11/23 0229   11/11/23 0230  vancomycin (VANCOREADY) IVPB 2000 mg/400 mL        2,000 mg 200 mL/hr over 120 Minutes Intravenous  Once 11/11/23 0229 11/11/23 0446   11/10/23 2230  ceFEPIme  (MAXIPIME) 2 g in sodium chloride 0.9 % 100 mL IVPB        2 g 200 mL/hr over 30 Minutes Intravenous  Once 11/10/23 2220 11/10/23 2301   11/10/23 2230  metroNIDAZOLE (FLAGYL) IVPB 500 mg        500 mg 100 mL/hr over 60 Minutes Intravenous  Once 11/10/23 2220 11/10/23 2350         Subjective: Patient seen and examined.  No complaints.  Remains afebrile.  Had lots of questions which were addressed.  Objective: Vitals:   11/12/23 2129 11/13/23 0010 11/13/23 0513 11/13/23 0730  BP:  (!) 152/62 124/70 (!) 145/74  Pulse:  65  63  Resp: 17 17 (!) 22 20  Temp:  98 F (36.7 C) 97.8 F (36.6 C) 98 F (36.7 C)  TempSrc:  Oral  Oral  SpO2:  91% 98% 98%  Weight:   116.8 kg   Height:        Intake/Output Summary (Last 24 hours) at 11/13/2023 4098 Last data filed  at 11/12/2023 1340 Gross per 24 hour  Intake 655 ml  Output --  Net 655 ml   Filed Weights   11/11/23 1900 11/12/23 0442 11/13/23 0513  Weight: 118.9 kg 117.9 kg 116.8 kg    Examination:  General exam: Appears calm and comfortable  Respiratory system: Clear to auscultation. Respiratory effort normal. Cardiovascular system: S1 & S2 heard, RRR. No JVD, murmurs, rubs, gallops or clicks. No pedal edema. Gastrointestinal system: Abdomen is nondistended, soft and nontender. No organomegaly or masses felt. Normal bowel sounds heard. Central nervous system: Alert and oriented. No focal neurological deficits. Extremities: Symmetric 5 x 5 power. Skin: Right great toe wound is healing appropriately, minimal erythema but not acutely infectious.  No tenderness and warmth. Psychiatry: Judgement and insight appear normal. Mood & affect appropriate.   Data Reviewed: I have personally reviewed following labs and imaging studies  CBC: Recent Labs  Lab 11/10/23 2109 11/11/23 0244 11/11/23 0432 11/12/23 0521 11/13/23 0300  WBC 13.5* 18.3* 17.6* 12.0* 12.0*  NEUTROABS 12.0* 16.1*  --   --   --   HGB 14.1 13.5 12.9* 12.0* 13.0   HCT 42.2 39.8 38.7* 37.1* 37.4*  MCV 92.1 92.6 93.0 93.0 89.3  PLT 200 186 166 174 187   Basic Metabolic Panel: Recent Labs  Lab 11/10/23 2109 11/11/23 0432 11/11/23 1910 11/12/23 0521 11/13/23 0300  NA 140 137  --  133* 137  K 4.4 4.0  --  3.9 4.3  CL 106 107  --  108 108  CO2 22 21*  --  22 21*  GLUCOSE 126* 65*  --  138* 212*  BUN 22 21  --  24* 32*  CREATININE 1.19 1.17  --  1.24 1.15  CALCIUM 9.8 8.8*  --  8.5* 8.9  MG  --  1.3* 2.0 2.1  --    GFR: Estimated Creatinine Clearance: 74.4 mL/min (by C-G formula based on SCr of 1.15 mg/dL). Liver Function Tests: Recent Labs  Lab 11/10/23 2109 11/11/23 0432  AST 27 23  ALT 24 20  ALKPHOS 45 30*  BILITOT 1.6* 1.8*  PROT 6.5 5.2*  ALBUMIN 3.5 2.8*   No results for input(s): "LIPASE", "AMYLASE" in the last 168 hours. No results for input(s): "AMMONIA" in the last 168 hours. Coagulation Profile: Recent Labs  Lab 11/10/23 2109  INR 2.1*   Cardiac Enzymes: No results for input(s): "CKTOTAL", "CKMB", "CKMBINDEX", "TROPONINI" in the last 168 hours. BNP (last 3 results) No results for input(s): "PROBNP" in the last 8760 hours. HbA1C: No results for input(s): "HGBA1C" in the last 72 hours. CBG: Recent Labs  Lab 11/12/23 0843 11/12/23 1101 11/12/23 1602 11/12/23 2118 11/13/23 0624  GLUCAP 248* 238* 208* 247* 178*   Lipid Profile: No results for input(s): "CHOL", "HDL", "LDLCALC", "TRIG", "CHOLHDL", "LDLDIRECT" in the last 72 hours. Thyroid Function Tests: No results for input(s): "TSH", "T4TOTAL", "FREET4", "T3FREE", "THYROIDAB" in the last 72 hours. Anemia Panel: No results for input(s): "VITAMINB12", "FOLATE", "FERRITIN", "TIBC", "IRON", "RETICCTPCT" in the last 72 hours. Sepsis Labs: Recent Labs  Lab 11/10/23 2105 11/10/23 2141 11/10/23 2333 11/11/23 0432  LATICACIDVEN 1.9 2.2* 2.3* 1.3    Recent Results (from the past 240 hours)  Blood Culture (routine x 2)     Status: Abnormal   Collection  Time: 11/10/23  9:09 PM   Specimen: BLOOD LEFT FOREARM  Result Value Ref Range Status   Specimen Description BLOOD LEFT FOREARM  Final   Special Requests   Final  BOTTLES DRAWN AEROBIC AND ANAEROBIC Blood Culture adequate volume   Culture  Setup Time   Final    GRAM POSITIVE COCCI IN CHAINS IN BOTH AEROBIC AND ANAEROBIC BOTTLES CRITICAL RESULT CALLED TO, READ BACK BY AND VERIFIED WITH: E. SINCLAIR PHARMD, AT 1025 11/11/23 D. VANHOOK Performed at [facility], [address], [place], [place] [address]    Culture STREPTOCOCCUS GROUP G (A)  Final   Report Status 11/13/2023 FINAL  Final   Organism ID, Bacteria STREPTOCOCCUS GROUP G  Final      Susceptibility   Streptococcus group g - MIC*    CLINDAMYCIN <=0.25 SENSITIVE Sensitive     AMPICILLIN <=0.25 SENSITIVE Sensitive     ERYTHROMYCIN <=0.12 SENSITIVE Sensitive     VANCOMYCIN 0.5 SENSITIVE Sensitive     CEFTRIAXONE <=0.12 SENSITIVE Sensitive     LEVOFLOXACIN 0.5 SENSITIVE Sensitive     PENICILLIN <=0.06 SENSITIVE Sensitive     * STREPTOCOCCUS GROUP G  Blood Culture ID Panel (Reflexed)     Status: Abnormal   Collection Time: 11/10/23  9:09 PM  Result Value Ref Range Status   Enterococcus faecalis NOT DETECTED NOT DETECTED Final   Enterococcus Faecium NOT DETECTED NOT DETECTED Final   Listeria monocytogenes NOT DETECTED NOT DETECTED Final   Staphylococcus species NOT DETECTED NOT DETECTED Final   Staphylococcus aureus (BCID) NOT DETECTED NOT DETECTED Final   Staphylococcus epidermidis NOT DETECTED NOT DETECTED Final   Staphylococcus lugdunensis NOT DETECTED NOT DETECTED Final   Streptococcus species DETECTED (A) NOT DETECTED Final    Comment: Not Enterococcus species, Streptococcus agalactiae, Streptococcus pyogenes, or Streptococcus pneumoniae. CRITICAL RESULT CALLED TO, READ BACK BY AND VERIFIED WITH: E. SINCLAIR PHARMD, AT 1025 11/11/23 D. VANHOOK    Streptococcus agalactiae NOT DETECTED NOT DETECTED Final    Streptococcus pneumoniae NOT DETECTED NOT DETECTED Final   Streptococcus pyogenes NOT DETECTED NOT DETECTED Final   A.calcoaceticus-baumannii NOT DETECTED NOT DETECTED Final   Bacteroides fragilis NOT DETECTED NOT DETECTED Final   Enterobacterales NOT DETECTED NOT DETECTED Final   Enterobacter cloacae complex NOT DETECTED NOT DETECTED Final   Escherichia coli NOT DETECTED NOT DETECTED Final   Klebsiella aerogenes NOT DETECTED NOT DETECTED Final   Klebsiella oxytoca NOT DETECTED NOT DETECTED Final   Klebsiella pneumoniae NOT DETECTED NOT DETECTED Final   Proteus species NOT DETECTED NOT DETECTED Final   Salmonella species NOT DETECTED NOT DETECTED Final   Serratia marcescens NOT DETECTED NOT DETECTED Final   Haemophilus influenzae NOT DETECTED NOT DETECTED Final   Neisseria meningitidis NOT DETECTED NOT DETECTED Final   Pseudomonas aeruginosa NOT DETECTED NOT DETECTED Final   Stenotrophomonas maltophilia NOT DETECTED NOT DETECTED Final   Candida albicans NOT DETECTED NOT DETECTED Final   Candida auris NOT DETECTED NOT DETECTED Final   Candida glabrata NOT DETECTED NOT DETECTED Final   Candida krusei NOT DETECTED NOT DETECTED Final   Candida parapsilosis NOT DETECTED NOT DETECTED Final   Candida tropicalis NOT DETECTED NOT DETECTED Final   Cryptococcus neoformans/gattii NOT DETECTED NOT DETECTED Final    Comment: Performed at [facility], [address], [place], [place] [address]  Blood Culture (routine x 2)     Status: Abnormal   Collection Time: 11/10/23  9:20 PM   Specimen: BLOOD RIGHT FOREARM  Result Value Ref Range Status   Specimen Description BLOOD RIGHT FOREARM  Final   Special Requests   Final    BOTTLES DRAWN AEROBIC AND ANAEROBIC Blood Culture adequate  volume   Culture  Setup Time   Final    GRAM POSITIVE COCCI IN CHAINS IN BOTH AEROBIC AND ANAEROBIC BOTTLES CRITICAL RESULT CALLED TO, READ BACK BY AND VERIFIED WITH: E. SINCLAIR PHARMD, AT 1025 11/11/23 D. VANHOOK     Culture (A)  Final    STREPTOCOCCUS GROUP G SUSCEPTIBILITIES PERFORMED ON PREVIOUS CULTURE WITHIN THE LAST 5 DAYS. Performed at [facility], [address], [place], [place] [address]    Report Status 11/13/2023 FINAL  Final  Resp panel by RT-PCR (RSV, Flu A&B, Covid) Anterior Nasal Swab     Status: None   Collection Time: 11/10/23 11:21 PM   Specimen: Anterior Nasal Swab  Result Value Ref Range Status   SARS Coronavirus 2 by RT PCR NEGATIVE NEGATIVE Final   Influenza A by PCR NEGATIVE NEGATIVE Final   Influenza B by PCR NEGATIVE NEGATIVE Final    Comment: (NOTE) The Xpert Xpress SARS-CoV-2/FLU/RSV plus assay is intended as an aid in the diagnosis of influenza from Nasopharyngeal swab specimens and should not be used as a sole basis for treatment. Nasal washings and aspirates are unacceptable for Xpert Xpress SARS-CoV-2/FLU/RSV testing.  Fact Sheet for Patients: BloggerCourse.com  Fact Sheet for Healthcare Providers: SeriousBroker.it  This test is not yet approved or cleared by the [place] FDA and has been authorized for detection and/or diagnosis of SARS-CoV-2 by FDA under an Emergency Use Authorization (EUA). This EUA will remain in effect (meaning this test can be used) for the duration of the COVID-19 declaration under Section 564(b)(1) of the Act, 21 U.S.C. section 360bbb-3(b)(1), unless the authorization is terminated or revoked.     Resp Syncytial Virus by PCR NEGATIVE NEGATIVE Final    Comment: (NOTE) Fact Sheet for Patients: BloggerCourse.com  Fact Sheet for Healthcare Providers: SeriousBroker.it  This test is not yet approved or cleared by the [place] FDA and has been authorized for detection and/or diagnosis of SARS-CoV-2 by FDA under an Emergency Use Authorization (EUA). This EUA will remain in effect (meaning this test can be used) for the  duration of the COVID-19 declaration under Section 564(b)(1) of the Act, 21 U.S.C. section 360bbb-3(b)(1), unless the authorization is terminated or revoked.  Performed at [facility], [address] [place], [place] [address]   Culture, blood (Routine X 2) w Reflex to ID Panel     Status: None (Preliminary result)   Collection Time: 11/12/23 11:14 AM   Specimen: BLOOD LEFT ARM  Result Value Ref Range Status   Specimen Description BLOOD LEFT ARM  Final   Special Requests   Final    BOTTLES DRAWN AEROBIC AND ANAEROBIC Blood Culture results may not be optimal due to an inadequate volume of blood received in culture bottles   Culture   Final    NO GROWTH < 24 HOURS Performed at [facility], [address], [place], [place] [address]    Report Status PENDING  Incomplete  Culture, blood (Routine X 2) w Reflex to ID Panel     Status: None (Preliminary result)   Collection Time: 11/12/23 11:25 AM   Specimen: BLOOD LEFT HAND  Result Value Ref Range Status   Specimen Description BLOOD LEFT HAND  Final   Special Requests   Final    BOTTLES DRAWN AEROBIC AND ANAEROBIC Blood Culture results may not be optimal due to an inadequate volume of blood received in culture bottles   Culture   Final    NO  GROWTH < 24 HOURS Performed at [facility], [address] [place], [place] [address]    Report Status PENDING  Incomplete     Radiology Studies: MR TOES RIGHT W WO CONTRAST Result Date: 11/12/2023 CLINICAL DATA:  Chronic foot wound. Bacteremia with cellulitis. Evaluate for osteomyelitis. EXAM: MRI OF THE RIGHT TOES WITHOUT AND WITH CONTRAST TECHNIQUE: Multiplanar, multisequence MR imaging of the right toes was performed both before and after administration of intravenous contrast. CONTRAST:  10mL GADAVIST GADOBUTROL 1 MMOL/ML IV SOLN COMPARISON:  Right foot radiographs 11/11/2023 and 09/27/2023. FINDINGS: Technical note: Despite efforts by the technologist and patient, mild to  moderate motion artifact is present on today's exam and could not be eliminated. This reduces exam sensitivity and specificity. Bones/Joint/Cartilage Mild clawtoe deformities of the 2nd through 5th toes. No evidence of acute fracture, dislocation or osteomyelitis. 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 No significant joint effusions or abnormal synovial enhancement. Ligaments The collateral ligaments of the metatarsophalangeal joints appear intact. Muscles and Tendons Forefoot muscular atrophy without focal fluid collection or abnormal enhancement. The forefoot tendons appear intact, without significant tenosynovitis. Soft tissues Mild soft tissue swelling and enhancement within the great toe. No focal fluid collection, foreign body or soft tissue emphysema demonstrated. IMPRESSION: 1. Mild soft tissue swelling and enhancement within the great toe consistent with cellulitis. No evidence of abscess or osteomyelitis. 2. 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 Electronically Signed   By: Carey Bullocks M.D.   On: 11/12/2023 16:37   DG Ankle Complete Right Result Date: 11/12/2023 CLINICAL DATA:  Acute right ankle pain. EXAM: RIGHT ANKLE - COMPLETE 3+ VIEW COMPARISON:  Foot radiograph earlier today. FINDINGS: No fracture or dislocation. Tibial talar degenerative change with spurring. Large fragmented plantar calcaneal spur. Small Achilles tendon enthesophyte. No ankle joint effusion. Prominent vascular calcifications with mild soft tissue edema. No radiopaque foreign body or soft tissue gas. IMPRESSION: 1. No acute osseous findings of the right ankle. 2. Tibial talar degenerative change. 3. Large fragmented plantar calcaneal spur. Electronically Signed   By: Narda Rutherford M.D.   On: 11/12/2023 15:22    Scheduled Meds:  atorvastatin  20 mg Oral Daily   cholecalciferol  1,000 Units Oral Daily    ezetimibe  10 mg Oral Daily   folic acid  1 mg Oral Daily   gabapentin  100 mg Oral TID   insulin aspart  0-5 Units Subcutaneous QHS   insulin aspart  0-6 Units Subcutaneous TID WC   insulin glargine  35 Units Subcutaneous QHS   metoprolol succinate  50 mg Oral Daily   rivaroxaban  20 mg Oral QHS   sacubitril-valsartan  1 tablet Oral BID   sodium chloride flush  3 mL Intravenous Q12H   Continuous Infusions:  cefTRIAXone (ROCEPHIN)  IV 2 g (11/12/23 1301)     LOS: 2 days   Hughie Closs, MD [facility]  11/13/2023, 8:17 AM   *Please note that this is a verbal dictation therefore any spelling or grammatical errors are due to the "Dragon Medical One" system interpretation.  Please page via Amion and do not message via secure chat for urgent patient care matters. Secure chat can be used for non urgent patient care matters.  How to contact the [facility] Attending or Consulting provider 7A - 7P or covering provider during after hours 7P -7A, for this patient?  Check the care team in [facility] and look for a)  attending/consulting TRH provider listed and b) the [facility] team listed. Page or secure chat 7A-7P. Log into [email or web address] and use [place]'s universal password to access. If you do not have the password, please contact the hospital operator. Locate the [facility] provider you are looking for under [facility] and page to a number that you can be directly reached. If you still have difficulty reaching the provider, please page the [facility] (Director on Call) for the Hospitalists listed on amion for assistance.

## 2023-11-13 NOTE — Progress Notes (Signed)
  Echocardiogram 2D Echocardiogram has been performed.  Leda Roys RDCS 11/13/2023, 10:47 AM

## 2023-11-13 NOTE — Plan of Care (Signed)
  Problem: Education: Goal: Ability to describe self-care measures that may prevent or decrease complications (Diabetes Survival Skills Education) will improve Outcome: Progressing   Problem: Metabolic: Goal: Ability to maintain appropriate glucose levels will improve Outcome: Progressing   Problem: Nutritional: Goal: Maintenance of adequate nutrition will improve Outcome: Progressing   Problem: Tissue Perfusion: Goal: Adequacy of tissue perfusion will improve Outcome: Progressing   Problem: Clinical Measurements: Goal: Signs and symptoms of infection will decrease Outcome: Progressing

## 2023-11-13 NOTE — Consult Note (Signed)
 [facility] Liaison Consult Note   11/13/2023  AYCE PIETRZYK September 12, 1950 [id number]  Insurance:  Medicare ACO [employer]  Primary Care Provider: Garlan Fillers, MD, with [facility], this provider is listed for the transition of care follow up appointments  and [facility] calls   [facility] Liaison met patient at bedside at [facility]. Patient up on side of be, spouse at bedside, explained reason for visit for post hospital follow up needs. Patient endorses PCP and also acknowledges many community calls. Patient expresses doing well with managing diabetes.    The patient was screened for hospitalization with noted medium risk score for unplanned readmission risk 2 hospital admissions in 6 months.  The patient was assessed for potential [facility] Coordination service needs for post hospital transition for care coordination. Review of patient's electronic medical record reveals patient is admitted with Sepsis with hx of diabetes.   Plan: [facility] Liaison will continue to follow progress and disposition to assess for post hospital community care coordination/management needs.  Referral request for community care coordination: Denies needs and anticipate post hospital TOC calls for follow up but denies needs for ongoing calls.   [facility], Population Health does not replace or interfere with any arrangements made by the Inpatient Transition of Care team.   For questions contact:   Charlesetta Shanks, RN, BSN, CCM [place]  [facility], [facility] Liaison Direct Dial: [phone] or secure chat Email: [email or web address]

## 2023-11-13 NOTE — Progress Notes (Signed)
 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)   History of DVT (deep vein thrombosis)   History of pulmonary embolism   Psoriatic arthritis [facility])          Assessment: 73 year old male with past medical history of diabetic foot ulcer, PE on Xarelto, psoriatic arthritis on methotrexate/Cosentyx admitted with chills found to have #Strep G bacteremia secondary to right toe ulcer with cellulitis #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 Patient is followed by podiatry for diabetic ulcer of toe - Blood cultures from admission grew 2/2 group B strep.  CT abdomen pelvis showed cholelithiasis.  Chest x-ray no active disease.  X-ray of right foot showed soft tissue ulceration. - Noted to have erythema at right toe.  Patient denies any injury to that region. - MRI right toes showed mild soft tissue swelling consistent with cellulitis, no osteo or abscess. - X-ray right ankle no osteo noted - TTE showed calcified aortic valve with aortic stenosis.  Technically difficult study.  Recommendations: -Discontinue ceftriaxone - Start cefazolin - Follow repeat blood cultures to ensure clearance - Order TEE, patient cardiology - Spoke to Dr.Beekman yesterday, holding methotrexate and Cosentyx while patient is on IV antibiotics atleast. - Antibiotic plan pending repeat blood culture clearance and TEE findings.  Dr. Algis Liming is covering this weekend.  New ID team on Monday.   Evaluation of this patient requires complex antimicrobial therapy  evaluation and counseling + isolation needs for disease transmission risk assessment and mitigation     Microbiology:   Antibiotics: Vancomycin 3/18-present Cefepime 3/18 Ceftriaxone 3/19-present Metronidazole 3/18-19   Cultures: Blood 3/18 2/2 group G strep 3/20 pending  SUBJECTIVE: Seen in bed.  No new complaints. Interval: Afebrile overnight.  WBC 12K.  Review of Systems: Review of Systems  All other systems reviewed and are negative.    Scheduled Meds:  atorvastatin  20 mg Oral Daily   cholecalciferol  1,000 Units Oral Daily   dapagliflozin propanediol  10 mg Oral Daily   ezetimibe  10 mg Oral Daily   folic acid  1 mg Oral Daily   gabapentin  100 mg Oral TID   insulin aspart  0-5 Units Subcutaneous QHS   insulin aspart  0-6 Units Subcutaneous TID WC   insulin glargine  40 Units Subcutaneous QHS   metoprolol succinate  50 mg Oral Daily   rivaroxaban  20 mg Oral QHS   sacubitril-valsartan  1 tablet Oral BID   sodium chloride flush  3 mL Intravenous Q12H   Continuous Infusions:   ceFAZolin (ANCEF) IV 2 g (11/13/23 1305)   PRN Meds:.acetaminophen **OR** acetaminophen, fentaNYL (SUBLIMAZE) injection, mouth rinse, oxyCODONE, senna-docusate Allergies  Allergen Reactions   Ace Inhibitors Other (See Comments)   Lisinopril Cough    OBJECTIVE: Vitals:   11/13/23 0010 11/13/23 0513 11/13/23 0730 11/13/23 0942  BP: (!) 152/62 124/70 (!) 145/74 (!) 154/71  Pulse: 65  63 63  Resp: 17 (!) 22 20   Temp: 98 F (36.7 C) 97.8 F (36.6 C) 98 F (36.7 C)  TempSrc: Oral  Oral   SpO2: 91% 98% 98%   Weight:  116.8 kg    Height:       Body mass index is 35.93 kg/m.  Physical Exam Constitutional:      General: He is not in acute distress.    Appearance: He is normal weight. He is not toxic-appearing.  HENT:     Head: Normocephalic and atraumatic.     Right Ear: External ear normal.     Left Ear: External ear normal.     Nose: No congestion or rhinorrhea.      Mouth/Throat:     Mouth: Mucous membranes are moist.     Pharynx: Oropharynx is clear.  Eyes:     Extraocular Movements: Extraocular movements intact.     Conjunctiva/sclera: Conjunctivae normal.     Pupils: Pupils are equal, round, and reactive to light.  Cardiovascular:     Rate and Rhythm: Normal rate and regular rhythm.     Heart sounds: No murmur heard.    No friction rub. No gallop.  Pulmonary:     Effort: Pulmonary effort is normal.     Breath sounds: Normal breath sounds.  Abdominal:     General: Abdomen is flat. Bowel sounds are normal.     Palpations: Abdomen is soft.  Musculoskeletal:        General: No swelling. Normal range of motion.     Cervical back: Normal range of motion and neck supple.  Skin:    General: Skin is warm and dry.  Neurological:     General: No focal deficit present.     Mental Status: He is oriented to person, place, and time.  Psychiatric:        Mood and Affect: Mood normal.       Lab Results Lab Results  Component Value Date   WBC 12.0 (H) 11/13/2023   HGB 13.0 11/13/2023   HCT 37.4 (L) 11/13/2023   MCV 89.3 11/13/2023   PLT 187 11/13/2023    Lab Results  Component Value Date   CREATININE 1.15 11/13/2023   BUN 32 (H) 11/13/2023   NA 137 11/13/2023   K 4.3 11/13/2023   CL 108 11/13/2023   CO2 21 (L) 11/13/2023    Lab Results  Component Value Date   ALT 20 11/11/2023   AST 23 11/11/2023   ALKPHOS 30 (L) 11/11/2023   BILITOT 1.8 (H) 11/11/2023        Danelle Earthly, MD [facility] 11/13/2023, 2:20 PM

## 2023-11-14 DIAGNOSIS — A419 Sepsis, unspecified organism: Secondary | ICD-10-CM | POA: Diagnosis not present

## 2023-11-14 LAB — BASIC METABOLIC PANEL
Anion gap: 4 — ABNORMAL LOW (ref 5–15)
BUN: 25 mg/dL — ABNORMAL HIGH (ref 8–23)
CO2: 22 mmol/L (ref 22–32)
Calcium: 8.6 mg/dL — ABNORMAL LOW (ref 8.9–10.3)
Chloride: 111 mmol/L (ref 98–111)
Creatinine, Ser: 0.97 mg/dL (ref 0.61–1.24)
GFR, Estimated: >60 mL/min (ref >60)
Glucose, Bld: 132 mg/dL — ABNORMAL HIGH (ref 70–99)
Potassium: 4 mmol/L (ref 3.5–5.1)
Sodium: 137 mmol/L (ref 135–145)

## 2023-11-14 LAB — CBC
HCT: 37.5 % — ABNORMAL LOW (ref 39.0–52.0)
Hemoglobin: 12.8 g/dL — ABNORMAL LOW (ref 13.0–17.0)
MCH: 30.7 pg (ref 26.0–34.0)
MCHC: 34.1 g/dL (ref 30.0–36.0)
MCV: 89.9 fL (ref 80.0–100.0)
Platelets: 216 10*3/uL (ref 150–400)
RBC: 4.17 MIL/uL — ABNORMAL LOW (ref 4.22–5.81)
RDW: 14.6 % (ref 11.5–15.5)
WBC: 7.3 10*3/uL (ref 4.0–10.5)
nRBC: 0 % (ref 0.0–0.2)

## 2023-11-14 LAB — GLUCOSE, CAPILLARY
Glucose-Capillary: 110 mg/dL — ABNORMAL HIGH (ref 70–99)
Glucose-Capillary: 80 mg/dL (ref 70–99)
Glucose-Capillary: 190 mg/dL — ABNORMAL HIGH (ref 70–99)
Glucose-Capillary: 177 mg/dL — ABNORMAL HIGH (ref 70–99)

## 2023-11-14 NOTE — Progress Notes (Signed)
 PROGRESS NOTE    Anthony Case  NWG:[id number] DOB: 1951/06/12 DOA: 11/10/2023 PCP: Garlan Fillers, MD   Brief Narrative:  Anthony Case is a 73 y.o. male with medical history significant for hypertension, insulin-dependent diabetes mellitus, OSA, history of PE on Xarelto, HFmrEF, psoriatic arthritis, and admission last month for diabetic foot ulcer, presented with shaking chills and severe right groin pain that started suddenly a day prior when he took his dog for a walk, but developed shaking chills and severe pain in the right groin shortly after this.  The pain eventually eased off but he continues to have intermittent chills, prompting his presentation.  The right great toe ulcer has been improving per patient report.  No other complaints.   Upon arrival to the ED, patient was febrile to 39.5 C and saturating mid 90s on room air with tachycardia and hypotension.  EKG demonstrates sinus tachycardia with LBBB and prolonged QT interval.  Chest x-ray negative for acute cardiopulmonary disease and no acute findings seen on plain radiographs of the right foot.  CT of the abdomen pelvis notable for mild right inguinal adenopathy.  WBC 13,500, negative respiratory virus panel, and lactic acid 2.3. Blood cultures were collected in the ED and the patient was given 2 L of LR, cefepime, and Flagyl.  Admitted under hospital service with suspicion of bacteremia.  Details of hospitalization as below.  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  Patient received 2 L of IV fluid bolus in the ED, lactic acidosis resolved.  Still has leukocytosis and persistent fever.  CT abdomen and pelvis shows right inguinal adenopathy, suspect he is bacteremic from skin/soft tissue infection involving RLE.  Right foot x-ray does not show any osteomyelitis signs clinically either.  Started on broad-spectrum antibiotics with the suspicion of possible bacteremia and eventually grew group G Streptococcus and 3/4 bottles of the blood culture, antibiotics switched from cefepime/vancomycin/Flagyl to Rocephin 2 g daily.  Antibiotics transitioned to cefazolin 11/13/2023.  Leukocytosis resolved, patient afebrile since admission.  Right foot did not appear to be actively infected so source of bacteremia remained unclear so ID was consulted.  ID obtained right foot MRI which showed mild soft tissue swelling consistent with cellulitis, no osteo or abscess. - X-ray right ankle no osteo noted - TTE showed calcified aortic valve with aortic stenosis.  Technically difficult study.  ID has consulted cardiology for TEE.  Per patient, he is scheduled for TEE on Monday.   Chronic HFmrEF  - Appears compensated, continue Entresto, Farxiga and Toprol-XL.   Insulin-dependent DM  - A1c was 6.0% in February 2025.  Appears to be taking 48 units of long-acting insulin, metformin, Farxiga and glipizide as well as Ozempic PTA.  Continue to hold all oral home medications but resumed Farxiga 11/13/2023.  Blood sugar controlled.  Continue Semglee 40 units.   Hx of DVT/PE  - Xarelto     Psoriatic arthritis  - Managed with methotrexate and Cosentyx and we will hold those due to active infection.  Hyperlipidemia: Continue statin.  Hyperbilirubinemia: Slightly elevated.  Could be due to severe sepsis.  CT abdomen did not show any liver pathology.  Hypomagnesemia: Resolved.  Cholelithiasis: Needs follow-up with general surgery as outpatient  for elective cholecystectomy.  DVT prophylaxis: Xarelto   Code Status: Full Code  Family Communication:  None present at bedside.  Plan of care discussed with patient in length and he/she verbalized understanding and agreed with it.  Status is: Inpatient Remains inpatient appropriate because: TEE scheduled on Monday.   Estimated body mass index is 36.23 kg/m as calculated from the following:   Height as of this encounter: 5\' 11"  (1.803 m).   Weight as of this encounter: 117.8 kg.    Nutritional Assessment: Body mass index is 36.23 kg/m.Marland Kitchen Seen by dietician.  I agree with the assessment and plan as outlined below: Nutrition Status:        . Skin Assessment: I have examined the patient's skin and I agree with the wound assessment as performed by the wound care RN as outlined below:    Consultants:  None  Procedures:  None  Antimicrobials:  Anti-infectives (From admission, onward)    Start     Dose/Rate Route Frequency Ordered Stop   11/13/23 1400  ceFAZolin (ANCEF) IVPB 2g/100 mL premix        2 g 200 mL/hr over 30 Minutes Intravenous Every 8 hours 11/13/23 1129     11/12/23 0500  vancomycin (VANCOREADY) IVPB 1500 mg/300 mL  Status:  Discontinued        1,500 mg 150 mL/hr over 120 Minutes Intravenous Every 24 hours 11/11/23 0449 11/11/23 1033   11/11/23 1300  cefTRIAXone (ROCEPHIN) 2 g in sodium chloride 0.9 % 100 mL IVPB  Status:  Discontinued        2 g 200 mL/hr over 30 Minutes Intravenous Every 24 hours 11/11/23 1033 11/13/23 1129   11/11/23 1000  metroNIDAZOLE (FLAGYL) IVPB 500 mg  Status:  Discontinued        500 mg 100 mL/hr over 60 Minutes Intravenous Every 12 hours 11/11/23 0221 11/11/23 1033   11/11/23 0600  ceFEPIme (MAXIPIME) 2 g in sodium chloride 0.9 % 100 mL IVPB  Status:  Discontinued        2 g 200 mL/hr over 30 Minutes Intravenous Every 8 hours 11/11/23 0248 11/11/23 1033   11/11/23 0230  vancomycin (VANCOCIN) IVPB 1000 mg/200 mL premix  Status:   Discontinued        1,000 mg 200 mL/hr over 60 Minutes Intravenous  Once 11/11/23 0221 11/11/23 0229   11/11/23 0230  vancomycin (VANCOREADY) IVPB 2000 mg/400 mL        2,000 mg 200 mL/hr over 120 Minutes Intravenous  Once 11/11/23 0229 11/11/23 0446   11/10/23 2230  ceFEPIme (MAXIPIME) 2 g in sodium chloride 0.9 % 100 mL IVPB        2 g 200 mL/hr over 30 Minutes Intravenous  Once 11/10/23 2220 11/10/23 2301   11/10/23 2230  metroNIDAZOLE (FLAGYL) IVPB 500 mg        500 mg 100 mL/hr over 60 Minutes Intravenous  Once 11/10/23 2220 11/10/23 2350         Subjective: Patient seen and examined.  No complaints today, he told me he is scheduled for TEE Monday.  He has some concerns about intubation and I advised him to discuss that with cardiology/anesthesia.  Objective: Vitals:   11/13/23 2338 11/14/23 0020 11/14/23 0445 11/14/23 0448  BP: (!) 165/86 Marland Kitchen)  159/70  128/82  Pulse: 60   (!) 54  Resp: 19   19  Temp: 97.7 F (36.5 C)   97.7 F (36.5 C)  TempSrc: Oral   Axillary  SpO2: 98%   97%  Weight:   117.8 kg   Height:        Intake/Output Summary (Last 24 hours) at 11/14/2023 0748 Last data filed at 11/13/2023 1602 Gross per 24 hour  Intake 820 ml  Output --  Net 820 ml   Filed Weights   11/12/23 0442 11/13/23 0513 11/14/23 0445  Weight: 117.9 kg 116.8 kg 117.8 kg    Examination:  General exam: Appears calm and comfortable  Respiratory system: Clear to auscultation. Respiratory effort normal. Cardiovascular system: S1 & S2 heard, RRR. No JVD, murmurs, rubs, gallops or clicks. No pedal edema. Gastrointestinal system: Abdomen is nondistended, soft and nontender. No organomegaly or masses felt. Normal bowel sounds heard. Central nervous system: Alert and oriented. No focal neurological deficits. Extremities: Symmetric 5 x 5 power. Skin: Right great toe wound with almost no erythema, no tenderness or warmth. Psychiatry: Judgement and insight appear normal. Mood & affect  appropriate.    Data Reviewed: I have personally reviewed following labs and imaging studies  CBC: Recent Labs  Lab 11/10/23 2109 11/11/23 0244 11/11/23 0432 11/12/23 0521 11/13/23 0300 11/14/23 0252  WBC 13.5* 18.3* 17.6* 12.0* 12.0* 7.3  NEUTROABS 12.0* 16.1*  --   --   --   --   HGB 14.1 13.5 12.9* 12.0* 13.0 12.8*  HCT 42.2 39.8 38.7* 37.1* 37.4* 37.5*  MCV 92.1 92.6 93.0 93.0 89.3 89.9  PLT 200 186 166 174 187 216   Basic Metabolic Panel: Recent Labs  Lab 11/10/23 2109 11/11/23 0432 11/11/23 1910 11/12/23 0521 11/13/23 0300 11/14/23 0252  NA 140 137  --  133* 137 137  K 4.4 4.0  --  3.9 4.3 4.0  CL 106 107  --  108 108 111  CO2 22 21*  --  22 21* 22  GLUCOSE 126* 65*  --  138* 212* 132*  BUN 22 21  --  24* 32* 25*  CREATININE 1.19 1.17  --  1.24 1.15 0.97  CALCIUM 9.8 8.8*  --  8.5* 8.9 8.6*  MG  --  1.3* 2.0 2.1  --   --    GFR: Estimated Creatinine Clearance: 88.5 mL/min (by C-G formula based on SCr of 0.97 mg/dL). Liver Function Tests: Recent Labs  Lab 11/10/23 2109 11/11/23 0432  AST 27 23  ALT 24 20  ALKPHOS 45 30*  BILITOT 1.6* 1.8*  PROT 6.5 5.2*  ALBUMIN 3.5 2.8*   No results for input(s): "LIPASE", "AMYLASE" in the last 168 hours. No results for input(s): "AMMONIA" in the last 168 hours. Coagulation Profile: Recent Labs  Lab 11/10/23 2109  INR 2.1*   Cardiac Enzymes: No results for input(s): "CKTOTAL", "CKMB", "CKMBINDEX", "TROPONINI" in the last 168 hours. BNP (last 3 results) No results for input(s): "PROBNP" in the last 8760 hours. HbA1C: No results for input(s): "HGBA1C" in the last 72 hours. CBG: Recent Labs  Lab 11/13/23 0624 11/13/23 1057 11/13/23 1616 11/13/23 2114 11/14/23 0602  GLUCAP 178* 183* 167* 169* 110*   Lipid Profile: No results for input(s): "CHOL", "HDL", "LDLCALC", "TRIG", "CHOLHDL", "LDLDIRECT" in the last 72 hours. Thyroid Function Tests: No results for input(s): "TSH", "T4TOTAL", "FREET4", "T3FREE",  "THYROIDAB" in the last 72 hours. Anemia Panel: No results for input(s): "VITAMINB12", "FOLATE", "FERRITIN", "TIBC", "IRON", "  RETICCTPCT" in the last 72 hours. Sepsis Labs: Recent Labs  Lab 11/10/23 2105 11/10/23 2141 11/10/23 2333 11/11/23 0432  LATICACIDVEN 1.9 2.2* 2.3* 1.3    Recent Results (from the past 240 hours)  Blood Culture (routine x 2)     Status: Abnormal   Collection Time: 11/10/23  9:09 PM   Specimen: BLOOD LEFT FOREARM  Result Value Ref Range Status   Specimen Description BLOOD LEFT FOREARM  Final   Special Requests   Final    BOTTLES DRAWN AEROBIC AND ANAEROBIC Blood Culture adequate volume   Culture  Setup Time   Final    GRAM POSITIVE COCCI IN CHAINS IN BOTH AEROBIC AND ANAEROBIC BOTTLES CRITICAL RESULT CALLED TO, READ BACK BY AND VERIFIED WITH: E. SINCLAIR PHARMD, AT 1025 11/11/23 D. VANHOOK Performed at [facility], [address] [place], [place] [address]    Culture STREPTOCOCCUS GROUP G (A)  Final   Report Status 11/13/2023 FINAL  Final   Organism ID, Bacteria STREPTOCOCCUS GROUP G  Final      Susceptibility   Streptococcus group g - MIC*    CLINDAMYCIN <=0.25 SENSITIVE Sensitive     AMPICILLIN <=0.25 SENSITIVE Sensitive     ERYTHROMYCIN <=0.12 SENSITIVE Sensitive     VANCOMYCIN 0.5 SENSITIVE Sensitive     CEFTRIAXONE <=0.12 SENSITIVE Sensitive     LEVOFLOXACIN 0.5 SENSITIVE Sensitive     PENICILLIN <=0.06 SENSITIVE Sensitive     * STREPTOCOCCUS GROUP G  Blood Culture ID Panel (Reflexed)     Status: Abnormal   Collection Time: 11/10/23  9:09 PM  Result Value Ref Range Status   Enterococcus faecalis NOT DETECTED NOT DETECTED Final   Enterococcus Faecium NOT DETECTED NOT DETECTED Final   Listeria monocytogenes NOT DETECTED NOT DETECTED Final   Staphylococcus species NOT DETECTED NOT DETECTED Final   Staphylococcus aureus (BCID) NOT DETECTED NOT DETECTED Final   Staphylococcus epidermidis NOT DETECTED NOT DETECTED Final   Staphylococcus  lugdunensis NOT DETECTED NOT DETECTED Final   Streptococcus species DETECTED (A) NOT DETECTED Final    Comment: Not Enterococcus species, Streptococcus agalactiae, Streptococcus pyogenes, or Streptococcus pneumoniae. CRITICAL RESULT CALLED TO, READ BACK BY AND VERIFIED WITH: E. SINCLAIR PHARMD, AT 1025 11/11/23 D. VANHOOK    Streptococcus agalactiae NOT DETECTED NOT DETECTED Final   Streptococcus pneumoniae NOT DETECTED NOT DETECTED Final   Streptococcus pyogenes NOT DETECTED NOT DETECTED Final   A.calcoaceticus-baumannii NOT DETECTED NOT DETECTED Final   Bacteroides fragilis NOT DETECTED NOT DETECTED Final   Enterobacterales NOT DETECTED NOT DETECTED Final   Enterobacter cloacae complex NOT DETECTED NOT DETECTED Final   Escherichia coli NOT DETECTED NOT DETECTED Final   Klebsiella aerogenes NOT DETECTED NOT DETECTED Final   Klebsiella oxytoca NOT DETECTED NOT DETECTED Final   Klebsiella pneumoniae NOT DETECTED NOT DETECTED Final   Proteus species NOT DETECTED NOT DETECTED Final   Salmonella species NOT DETECTED NOT DETECTED Final   Serratia marcescens NOT DETECTED NOT DETECTED Final   Haemophilus influenzae NOT DETECTED NOT DETECTED Final   Neisseria meningitidis NOT DETECTED NOT DETECTED Final   Pseudomonas aeruginosa NOT DETECTED NOT DETECTED Final   Stenotrophomonas maltophilia NOT DETECTED NOT DETECTED Final   Candida albicans NOT DETECTED NOT DETECTED Final   Candida auris NOT DETECTED NOT DETECTED Final   Candida glabrata NOT DETECTED NOT DETECTED Final   Candida krusei NOT DETECTED NOT DETECTED Final   Candida parapsilosis NOT DETECTED NOT DETECTED Final   Candida tropicalis NOT DETECTED NOT  DETECTED Final   Cryptococcus neoformans/gattii NOT DETECTED NOT DETECTED Final    Comment: Performed at [facility], [address], [place], [place] [address]  Blood Culture (routine x 2)     Status: Abnormal   Collection Time: 11/10/23  9:20 PM   Specimen: BLOOD RIGHT FOREARM   Result Value Ref Range Status   Specimen Description BLOOD RIGHT FOREARM  Final   Special Requests   Final    BOTTLES DRAWN AEROBIC AND ANAEROBIC Blood Culture adequate volume   Culture  Setup Time   Final    GRAM POSITIVE COCCI IN CHAINS IN BOTH AEROBIC AND ANAEROBIC BOTTLES CRITICAL RESULT CALLED TO, READ BACK BY AND VERIFIED WITH: E. SINCLAIR PHARMD, AT 1025 11/11/23 D. VANHOOK    Culture (A)  Final    STREPTOCOCCUS GROUP G SUSCEPTIBILITIES PERFORMED ON PREVIOUS CULTURE WITHIN THE LAST 5 DAYS. Performed at [facility], [address], [place], [place] [address]    Report Status 11/13/2023 FINAL  Final  Resp panel by RT-PCR (RSV, Flu A&B, Covid) Anterior Nasal Swab     Status: None   Collection Time: 11/10/23 11:21 PM   Specimen: Anterior Nasal Swab  Result Value Ref Range Status   SARS Coronavirus 2 by RT PCR NEGATIVE NEGATIVE Final   Influenza A by PCR NEGATIVE NEGATIVE Final   Influenza B by PCR NEGATIVE NEGATIVE Final    Comment: (NOTE) The Xpert Xpress SARS-CoV-2/FLU/RSV plus assay is intended as an aid in the diagnosis of influenza from Nasopharyngeal swab specimens and should not be used as a sole basis for treatment. Nasal washings and aspirates are unacceptable for Xpert Xpress SARS-CoV-2/FLU/RSV testing.  Fact Sheet for Patients: BloggerCourse.com  Fact Sheet for Healthcare Providers: SeriousBroker.it  This test is not yet approved or cleared by the [place] FDA and has been authorized for detection and/or diagnosis of SARS-CoV-2 by FDA under an Emergency Use Authorization (EUA). This EUA will remain in effect (meaning this test can be used) for the duration of the COVID-19 declaration under Section 564(b)(1) of the Act, 21 U.S.C. section 360bbb-3(b)(1), unless the authorization is terminated or revoked.     Resp Syncytial Virus by PCR NEGATIVE NEGATIVE Final    Comment: (NOTE) Fact Sheet for  Patients: BloggerCourse.com  Fact Sheet for Healthcare Providers: SeriousBroker.it  This test is not yet approved or cleared by the [place] FDA and has been authorized for detection and/or diagnosis of SARS-CoV-2 by FDA under an Emergency Use Authorization (EUA). This EUA will remain in effect (meaning this test can be used) for the duration of the COVID-19 declaration under Section 564(b)(1) of the Act, 21 U.S.C. section 360bbb-3(b)(1), unless the authorization is terminated or revoked.  Performed at [facility], [address], [place], [place] [address]   Culture, blood (Routine X 2) w Reflex to ID Panel     Status: None (Preliminary result)   Collection Time: 11/12/23 11:14 AM   Specimen: BLOOD LEFT ARM  Result Value Ref Range Status   Specimen Description BLOOD LEFT ARM  Final   Special Requests   Final    BOTTLES DRAWN AEROBIC AND ANAEROBIC Blood Culture results may not be optimal due to an inadequate volume of blood received in culture bottles   Culture   Final    NO GROWTH < 24 HOURS Performed at [facility], [address] [place], [place] [address]    Report Status PENDING  Incomplete  Culture, blood (  Routine X 2) w Reflex to ID Panel     Status: None (Preliminary result)   Collection Time: 11/12/23 11:25 AM   Specimen: BLOOD LEFT HAND  Result Value Ref Range Status   Specimen Description BLOOD LEFT HAND  Final   Special Requests   Final    BOTTLES DRAWN AEROBIC AND ANAEROBIC Blood Culture results may not be optimal due to an inadequate volume of blood received in culture bottles   Culture   Final    NO GROWTH < 24 HOURS Performed at [facility], [address], [place], [place] [address]    Report Status PENDING  Incomplete     Radiology Studies: ECHOCARDIOGRAM COMPLETE Result Date: 11/13/2023    ECHOCARDIOGRAM REPORT   Patient Name:   Anthony Case Date of Exam: 11/13/2023 Medical  Rec #:  [id number]          Height:       71.0 in Accession #:    [id number]         Weight:       257.6 lb Date of Birth:  23-Nov-1950          BSA:          2.348 m Patient Age:    72 years           BP:           154/71 mmHg Patient Gender: M                  HR:           62 bpm. Exam Location:  Inpatient Procedure: 2D Echo, Color Doppler, Cardiac Doppler and Intracardiac            Opacification Agent (Both Spectral and Color Flow Doppler were            utilized during procedure). Indications:    Endocarditis I38  History:        Patient has prior history of Echocardiogram examinations, most                 recent 08/11/2023.  Sonographer:    Harriette Bouillon RDCS Referring Phys: [facility]  Sonographer Comments: Technically difficult study due to poor echo windows. IMPRESSIONS  1. Technically difficult study with reduced echo windows  2. Left ventricular ejection fraction, by estimation, is 40 to 45%. Left ventricular ejection fraction by 2D MOD biplane is 41.2 %. The left ventricle has mildly decreased function. The left ventricle demonstrates global hypokinesis. There is mild left ventricular hypertrophy. Left ventricular diastolic parameters are consistent with Grade I diastolic dysfunction (impaired relaxation).  3. Right ventricular systolic function is normal. The right ventricular size is normal. Tricuspid regurgitation signal is inadequate for assessing PA pressure.  4. The mitral valve is grossly normal. No evidence of mitral valve regurgitation.  5. The aortic valve is calcified. There is moderate calcification of the aortic valve. Aortic valve regurgitation is not visualized. Moderate aortic valve stenosis. Aortic valve area, by VTI measures 1.40 cm. Aortic valve mean gradient measures 10.0 mmHg. Aortic valve Vmax measures 1.88 m/s.  6. Aortic dilatation noted. There is borderline dilatation of the ascending aorta, measuring 39 mm. Comparison(s): No significant change from prior study. 08/11/2023:  LVEF 40-45%. Conclusion(s)/Recommendation(s): No evidence of valvular vegetations on this transthoracic echocardiogram. Consider a transesophageal echocardiogram to exclude infective endocarditis if clinically indicated. FINDINGS  Left Ventricle: Left ventricular ejection fraction, by estimation, is 40 to 45%. Left ventricular  ejection fraction by 2D MOD biplane is 41.2 %. The left ventricle has mildly decreased function. The left ventricle demonstrates global hypokinesis. Definity contrast agent was given IV to delineate the left ventricular endocardial borders. The left ventricular internal cavity size was normal in size. There is mild left ventricular hypertrophy. Abnormal (paradoxical) septal motion, consistent with left bundle branch block. Left ventricular diastolic parameters are consistent with Grade I diastolic dysfunction (impaired relaxation). Indeterminate filling pressures. Right Ventricle: The right ventricular size is normal. No increase in right ventricular wall thickness. Right ventricular systolic function is normal. Tricuspid regurgitation signal is inadequate for assessing PA pressure. Left Atrium: Left atrial size was normal in size. Right Atrium: Right atrial size was normal in size. Pericardium: There is no evidence of pericardial effusion. Mitral Valve: The mitral valve is grossly normal. No evidence of mitral valve regurgitation. Tricuspid Valve: The tricuspid valve is not well visualized. Tricuspid valve regurgitation is trivial. Aortic Valve: The aortic valve is calcified. There is moderate calcification of the aortic valve. Aortic valve regurgitation is not visualized. Moderate aortic stenosis is present. Aortic valve mean gradient measures 10.0 mmHg. Aortic valve peak gradient  measures 14.1 mmHg. Aortic valve area, by VTI measures 1.40 cm. Pulmonic Valve: The pulmonic valve was not well visualized. Pulmonic valve regurgitation is not visualized. Aorta: Aortic dilatation noted. There is  borderline dilatation of the ascending aorta, measuring 39 mm. Venous: The inferior vena cava was not well visualized. IAS/Shunts: No atrial level shunt detected by color flow Doppler.  LEFT VENTRICLE PLAX 2D                        Biplane EF (MOD) LVIDd:         5.70 cm         LV Biplane EF:   Left LVIDs:         3.40 cm                          ventricular LV PW:         1.20 cm                          ejection LV IVS:        1.20 cm                          fraction by LVOT diam:     2.10 cm                          2D MOD LV SV:         82                               biplane is LV SV Index:   35                               41.2 %. LVOT Area:     3.46 cm                                Diastology  LV e' medial:    6.31 cm/s LV Volumes (MOD)               LV E/e' medial:  13.7 LV vol d, MOD    134.6 ml      LV e' lateral:   8.92 cm/s A2C:                           LV E/e' lateral: 9.7 LV vol d, MOD    178.7 ml A4C: LV vol s, MOD    83.3 ml A2C: LV vol s, MOD    102.4 ml A4C: LV SV MOD A2C:   51.3 ml LV SV MOD A4C:   178.7 ml LV SV MOD BP:    64.9 ml RIGHT VENTRICLE RV S prime:     14.80 cm/s TAPSE (M-mode): 2.5 cm LEFT ATRIUM           Index LA diam:      4.80 cm 2.04 cm/m LA Vol (A4C): 77.5 ml 33.01 ml/m  AORTIC VALVE AV Area (Vmax):    1.59 cm AV Area (Vmean):   1.40 cm AV Area (VTI):     1.40 cm AV Vmax:           188.00 cm/s AV Vmean:          155.000 cm/s AV VTI:            0.582 m AV Peak Grad:      14.1 mmHg AV Mean Grad:      10.0 mmHg LVOT Vmax:         86.10 cm/s LVOT Vmean:        62.800 cm/s LVOT VTI:          0.236 m LVOT/AV VTI ratio: 0.41  AORTA Ao Root diam: 3.70 cm Ao Asc diam:  3.90 cm MITRAL VALVE MV Area (PHT): 2.19 cm     SHUNTS MV Decel Time: 346 msec     Systemic VTI:  0.24 m MV E velocity: 86.50 cm/s   Systemic Diam: 2.10 cm MV A velocity: 102.00 cm/s MV E/A ratio:  0.85 Zoila Shutter MD Electronically signed by Zoila Shutter MD Signature Date/Time:  11/13/2023/12:39:02 PM    Final    MR TOES RIGHT W WO CONTRAST Result Date: 11/12/2023 CLINICAL DATA:  Chronic foot wound. Bacteremia with cellulitis. Evaluate for osteomyelitis. EXAM: MRI OF THE RIGHT TOES WITHOUT AND WITH CONTRAST TECHNIQUE: Multiplanar, multisequence MR imaging of the right toes was performed both before and after administration of intravenous contrast. CONTRAST:  10mL GADAVIST GADOBUTROL 1 MMOL/ML IV SOLN COMPARISON:  Right foot radiographs 11/11/2023 and 09/27/2023. FINDINGS: Technical note: Despite efforts by the technologist and patient, mild to moderate motion artifact is present on today's exam and could not be eliminated. This reduces exam sensitivity and specificity. Bones/Joint/Cartilage Mild clawtoe deformities of the 2nd through 5th toes. No evidence of acute fracture, dislocation or osteomyelitis. 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 No significant joint effusions or abnormal synovial enhancement. Ligaments The collateral ligaments of the metatarsophalangeal joints appear intact. Muscles and Tendons Forefoot muscular atrophy without focal fluid collection or abnormal enhancement. The forefoot tendons appear intact, without significant tenosynovitis. Soft tissues Mild soft tissue swelling and enhancement within the great toe. No focal fluid collection, foreign body or soft tissue emphysema demonstrated. IMPRESSION: 1. Mild soft tissue swelling and enhancement within the great toe consistent with cellulitis. No evidence of  abscess or osteomyelitis. 2. 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 Electronically Signed   By: Carey Bullocks M.D.   On: 11/12/2023 16:37   DG Ankle Complete Right Result Date: 11/12/2023 CLINICAL DATA:  Acute right ankle pain. EXAM: RIGHT ANKLE - COMPLETE 3+ VIEW COMPARISON:  Foot radiograph earlier today. FINDINGS: No fracture or dislocation.  Tibial talar degenerative change with spurring. Large fragmented plantar calcaneal spur. Small Achilles tendon enthesophyte. No ankle joint effusion. Prominent vascular calcifications with mild soft tissue edema. No radiopaque foreign body or soft tissue gas. IMPRESSION: 1. No acute osseous findings of the right ankle. 2. Tibial talar degenerative change. 3. Large fragmented plantar calcaneal spur. Electronically Signed   By: Narda Rutherford M.D.   On: 11/12/2023 15:22    Scheduled Meds:  atorvastatin  20 mg Oral Daily   cholecalciferol  1,000 Units Oral Daily   dapagliflozin propanediol  10 mg Oral Daily   ezetimibe  10 mg Oral Daily   folic acid  1 mg Oral Daily   gabapentin  100 mg Oral TID   insulin aspart  0-5 Units Subcutaneous QHS   insulin aspart  0-6 Units Subcutaneous TID WC   insulin glargine  40 Units Subcutaneous QHS   metoprolol succinate  50 mg Oral Daily   rivaroxaban  20 mg Oral QHS   sacubitril-valsartan  1 tablet Oral BID   sodium chloride flush  3 mL Intravenous Q12H   Continuous Infusions:   ceFAZolin (ANCEF) IV 2 g (11/14/23 0532)     LOS: 3 days   Hughie Closs, MD [facility]  11/14/2023, 7:48 AM   *Please note that this is a verbal dictation therefore any spelling or grammatical errors are due to the "[employer]agon [employer] One" system interpretation.  Please page via Amion and do not message via secure chat for urgent patient care matters. Secure chat can be used for non urgent patient care matters.  How to contact the [facility] Attending or Consulting provider 7A - 7P or covering provider during after hours 7P -7A, for this patient?  Check the care team in [facility] and look for a) attending/consulting [facility] provider listed and b) the [facility] team listed. Page or secure chat 7A-7P. Log into [email or web address] and use [place]'s universal password to access. If you do not have the password, please contact the hospital operator. Locate the [facility] provider you are looking for under  [facility] and page to a number that you can be directly reached. If you still have difficulty reaching the provider, please page the [facility] (Director on Call) for the Hospitalists listed on amion for assistance.

## 2023-11-14 NOTE — Plan of Care (Signed)
  Problem: Health Behavior/Discharge Planning: Goal: Ability to identify and utilize available resources and services will improve Outcome: Progressing   Problem: Metabolic: Goal: Ability to maintain appropriate glucose levels will improve Outcome: Progressing   Problem: Nutritional: Goal: Maintenance of adequate nutrition will improve Outcome: Progressing

## 2023-11-14 NOTE — Progress Notes (Signed)
 [facility] night cross cover note:   I was notified by RN of the patient's sustained heart rates into the high 40s.  He is currently asymptomatic, with additional vital signs stable, including afebrile, blood pressure 159/70.  He has a history of obstructive sleep apnea, and is currently on CPAP, with oxygen saturations in the high 90s.  Per brief chart review, including review of prior heart rates, it appears that last evening, his heart rates frequently decreased into the low 50s although not quite into the high 40s.  He has noted to be on metoprolol succinate 50 mg p.o. daily.  I have ordered an EKG to further evaluate, and also added hold parameters for bradycardia for his metoprolol succinate.  As the patient is asymptomatic, with vital signs otherwise stable, will otherwise continue to monitor at this time.   Newton Pigg, DO Hospitalist

## 2023-11-15 ENCOUNTER — Encounter: Payer: Self-pay | Admitting: Podiatry

## 2023-11-15 DIAGNOSIS — A419 Sepsis, unspecified organism: Secondary | ICD-10-CM | POA: Diagnosis not present

## 2023-11-15 DIAGNOSIS — B955 Unspecified streptococcus as the cause of diseases classified elsewhere: Secondary | ICD-10-CM | POA: Diagnosis present

## 2023-11-15 LAB — GLUCOSE, CAPILLARY
Glucose-Capillary: 101 mg/dL — ABNORMAL HIGH (ref 70–99)
Glucose-Capillary: 139 mg/dL — ABNORMAL HIGH (ref 70–99)
Glucose-Capillary: 171 mg/dL — ABNORMAL HIGH (ref 70–99)
Glucose-Capillary: 168 mg/dL — ABNORMAL HIGH (ref 70–99)

## 2023-11-15 NOTE — Progress Notes (Signed)
       Date: 11/15/2023  Patient name: Anthony Case  Medical record number: [id number]  Date of birth: 11-19-1950    Repeat blood cultures NGTD   [name] 11/15/2023, 6:40 PM

## 2023-11-15 NOTE — H&P (View-Only) (Signed)
 PROGRESS NOTE    Anthony Case  ZOX:[id number] DOB: 07-27-1951 DOA: 11/10/2023 PCP: Garlan Fillers, MD   Brief Narrative:  73 y.o. male with medical history significant for hypertension, insulin-dependent diabetes mellitus, OSA, history of PE on Xarelto, HFmrEF, psoriatic arthritis, and admission last month for diabetic foot ulcer, presented with shaking chills and severe right groin pain. On presentation, he was febrile, tachycardic and hypotensive.  Chest x-ray and right foot x-ray were negative.  CT of abdomen and pelvis notable for mild right inguinal adenopathy.  WBC 13,500, negative COVID/RSV/influenza PCR, lactic acid 2.3.  He was started on IV fluids and antibiotics.  Subsequently, he had group G Streptococcus bacteremia.  ID was consulted.  MRI of right foot showed cellulitis with no osteomyelitis or abscess.  TTE showed calcified aortic valve with aortic stenosis.  TEE possibly planned for Monday.  Assessment & Plan:   Severe sepsis: Present on admission Right foot cellulitis Group G Streptococcus bacteremia -ID following.  Currently on IV Ancef. -MRI of right foot showed cellulitis with no osteomyelitis or abscess.  TTE showed calcified aortic valve with aortic stenosis.  TEE possibly planned for Monday. -Currently afebrile and hemodynamically stable  Chronic systolic heart failure Hypertension-hyperlipidemia -Compensated.  Continue Entresto, Farxiga and Toprol XL.  Continue statin and ezetimibe -Strict input and output.  Daily weights.  Fluid restriction.  Leukocytosis -Resolved  Diabetes mellitus type 2 with hyperglycemia -Continue long-acting insulin along with CBGs with SSI.  Carb modified diet  History of DVT/PE -Continue Xarelto  Anemia of chronic disease -From chronic illnesses.  Monitor intermittently  Psoriatic arthritis -Methotrexate and Cosentyx held due to active infection  Cholelithiasis -Needs elective outpatient follow-up with general surgery  if symptomatic  Obesity class I -Outpatient follow-up   DVT prophylaxis: Xarelto Code Status: Full Family Communication: None at bedside Disposition Plan: Status is: Inpatient Remains inpatient appropriate because: Of severity of illness  Consultants: ID  Procedures: 2D echo  Antimicrobials:  Anti-infectives (From admission, onward)    Start     Dose/Rate Route Frequency Ordered Stop   11/13/23 1400  ceFAZolin (ANCEF) IVPB 2g/100 mL premix        2 g 200 mL/hr over 30 Minutes Intravenous Every 8 hours 11/13/23 1129     11/12/23 0500  vancomycin (VANCOREADY) IVPB 1500 mg/300 mL  Status:  Discontinued        1,500 mg 150 mL/hr over 120 Minutes Intravenous Every 24 hours 11/11/23 0449 11/11/23 1033   11/11/23 1300  cefTRIAXone (ROCEPHIN) 2 g in sodium chloride 0.9 % 100 mL IVPB  Status:  Discontinued        2 g 200 mL/hr over 30 Minutes Intravenous Every 24 hours 11/11/23 1033 11/13/23 1129   11/11/23 1000  metroNIDAZOLE (FLAGYL) IVPB 500 mg  Status:  Discontinued        500 mg 100 mL/hr over 60 Minutes Intravenous Every 12 hours 11/11/23 0221 11/11/23 1033   11/11/23 0600  ceFEPIme (MAXIPIME) 2 g in sodium chloride 0.9 % 100 mL IVPB  Status:  Discontinued        2 g 200 mL/hr over 30 Minutes Intravenous Every 8 hours 11/11/23 0248 11/11/23 1033   11/11/23 0230  vancomycin (VANCOCIN) IVPB 1000 mg/200 mL premix  Status:  Discontinued        1,000 mg 200 mL/hr over 60 Minutes Intravenous  Once 11/11/23 0221 11/11/23 0229   11/11/23 0230  vancomycin (VANCOREADY) IVPB 2000 mg/400 mL  2,000 mg 200 mL/hr over 120 Minutes Intravenous  Once 11/11/23 0229 11/11/23 0446   11/10/23 2230  ceFEPIme (MAXIPIME) 2 g in sodium chloride 0.9 % 100 mL IVPB        2 g 200 mL/hr over 30 Minutes Intravenous  Once 11/10/23 2220 11/10/23 2301   11/10/23 2230  metroNIDAZOLE (FLAGYL) IVPB 500 mg        500 mg 100 mL/hr over 60 Minutes Intravenous  Once 11/10/23 2220 11/10/23 2350         Subjective: Patient seen and examined at bedside.  Denies any chest pain, fever or shortness of breath.  Objective: Vitals:   11/15/23 0049 11/15/23 0430 11/15/23 0447 11/15/23 0718  BP: 128/76  121/69 138/72  Pulse: 62  74 (!) 55  Resp: 19  16 14   Temp: (!) 97.5 F (36.4 C)  (!) 97.5 F (36.4 C) (!) 97.4 F (36.3 C)  TempSrc: Oral  Axillary Oral  SpO2: 95%  96% 94%  Weight:  114.5 kg    Height:        Intake/Output Summary (Last 24 hours) at 11/15/2023 0752 Last data filed at 11/14/2023 2121 Gross per 24 hour  Intake 540 ml  Output --  Net 540 ml   Filed Weights   11/13/23 0513 11/14/23 0445 11/15/23 0430  Weight: 116.8 kg 117.8 kg 114.5 kg    Examination:  General exam: Appears calm and comfortable.  On room air. Respiratory system: Bilateral decreased breath sounds at bases Cardiovascular system: S1 & S2 heard, Rate controlled Gastrointestinal system: Abdomen is obese, distended slightly, soft and nontender.  Bowel sounds heard. Extremities: No cyanosis, clubbing, edema  Central nervous system: Alert and awake. No focal neurological deficits. Moving extremities Skin: Right great toe wound with almost no erythema or tenderness  psychiatry: Normal mood, affect and judgement   Data Reviewed: I have personally reviewed following labs and imaging studies  CBC: Recent Labs  Lab 11/10/23 2109 11/11/23 0244 11/11/23 0432 11/12/23 0521 11/13/23 0300 11/14/23 0252  WBC 13.5* 18.3* 17.6* 12.0* 12.0* 7.3  NEUTROABS 12.0* 16.1*  --   --   --   --   HGB 14.1 13.5 12.9* 12.0* 13.0 12.8*  HCT 42.2 39.8 38.7* 37.1* 37.4* 37.5*  MCV 92.1 92.6 93.0 93.0 89.3 89.9  PLT 200 186 166 174 187 216   Basic Metabolic Panel: Recent Labs  Lab 11/10/23 2109 11/11/23 0432 11/11/23 1910 11/12/23 0521 11/13/23 0300 11/14/23 0252  NA 140 137  --  133* 137 137  K 4.4 4.0  --  3.9 4.3 4.0  CL 106 107  --  108 108 111  CO2 22 21*  --  22 21* 22  GLUCOSE 126* 65*  --   138* 212* 132*  BUN 22 21  --  24* 32* 25*  CREATININE 1.19 1.17  --  1.24 1.15 0.97  CALCIUM 9.8 8.8*  --  8.5* 8.9 8.6*  MG  --  1.3* 2.0 2.1  --   --    GFR: Estimated Creatinine Clearance: 87.3 mL/min (by C-G formula based on SCr of 0.97 mg/dL). Liver Function Tests: Recent Labs  Lab 11/10/23 2109 11/11/23 0432  AST 27 23  ALT 24 20  ALKPHOS 45 30*  BILITOT 1.6* 1.8*  PROT 6.5 5.2*  ALBUMIN 3.5 2.8*   No results for input(s): "LIPASE", "AMYLASE" in the last 168 hours. No results for input(s): "AMMONIA" in the last 168 hours. Coagulation Profile: Recent Labs  Lab  11/10/23 2109  INR 2.1*   Cardiac Enzymes: No results for input(s): "CKTOTAL", "CKMB", "CKMBINDEX", "TROPONINI" in the last 168 hours. BNP (last 3 results) No results for input(s): "PROBNP" in the last 8760 hours. HbA1C: No results for input(s): "HGBA1C" in the last 72 hours. CBG: Recent Labs  Lab 11/14/23 0602 11/14/23 1130 11/14/23 1632 11/14/23 2013 11/15/23 0637  GLUCAP 110* 80 190* 177* 101*   Lipid Profile: No results for input(s): "CHOL", "HDL", "LDLCALC", "TRIG", "CHOLHDL", "LDLDIRECT" in the last 72 hours. Thyroid Function Tests: No results for input(s): "TSH", "T4TOTAL", "FREET4", "T3FREE", "THYROIDAB" in the last 72 hours. Anemia Panel: No results for input(s): "VITAMINB12", "FOLATE", "FERRITIN", "TIBC", "IRON", "RETICCTPCT" in the last 72 hours. Sepsis Labs: Recent Labs  Lab 11/10/23 2105 11/10/23 2141 11/10/23 2333 11/11/23 0432  LATICACIDVEN 1.9 2.2* 2.3* 1.3    Recent Results (from the past 240 hours)  Blood Culture (routine x 2)     Status: Abnormal   Collection Time: 11/10/23  9:09 PM   Specimen: BLOOD LEFT FOREARM  Result Value Ref Range Status   Specimen Description BLOOD LEFT FOREARM  Final   Special Requests   Final    BOTTLES DRAWN AEROBIC AND ANAEROBIC Blood Culture adequate volume   Culture  Setup Time   Final    GRAM POSITIVE COCCI IN CHAINS IN BOTH AEROBIC AND  ANAEROBIC BOTTLES CRITICAL RESULT CALLED TO, READ BACK BY AND VERIFIED WITH: E. SINCLAIR PHARMD, AT 1025 11/11/23 D. VANHOOK Performed at [facility], [address], [place], [place] [address]    Culture STREPTOCOCCUS GROUP G (A)  Final   Report Status 11/13/2023 FINAL  Final   Organism ID, Bacteria STREPTOCOCCUS GROUP G  Final      Susceptibility   Streptococcus group g - MIC*    CLINDAMYCIN <=0.25 SENSITIVE Sensitive     AMPICILLIN <=0.25 SENSITIVE Sensitive     ERYTHROMYCIN <=0.12 SENSITIVE Sensitive     VANCOMYCIN 0.5 SENSITIVE Sensitive     CEFTRIAXONE <=0.12 SENSITIVE Sensitive     LEVOFLOXACIN 0.5 SENSITIVE Sensitive     PENICILLIN <=0.06 SENSITIVE Sensitive     * STREPTOCOCCUS GROUP G  Blood Culture ID Panel (Reflexed)     Status: Abnormal   Collection Time: 11/10/23  9:09 PM  Result Value Ref Range Status   Enterococcus faecalis NOT DETECTED NOT DETECTED Final   Enterococcus Faecium NOT DETECTED NOT DETECTED Final   Listeria monocytogenes NOT DETECTED NOT DETECTED Final   Staphylococcus species NOT DETECTED NOT DETECTED Final   Staphylococcus aureus (BCID) NOT DETECTED NOT DETECTED Final   Staphylococcus epidermidis NOT DETECTED NOT DETECTED Final   Staphylococcus lugdunensis NOT DETECTED NOT DETECTED Final   Streptococcus species DETECTED (A) NOT DETECTED Final    Comment: Not Enterococcus species, Streptococcus agalactiae, Streptococcus pyogenes, or Streptococcus pneumoniae. CRITICAL RESULT CALLED TO, READ BACK BY AND VERIFIED WITH: E. SINCLAIR PHARMD, AT 1025 11/11/23 D. VANHOOK    Streptococcus agalactiae NOT DETECTED NOT DETECTED Final   Streptococcus pneumoniae NOT DETECTED NOT DETECTED Final   Streptococcus pyogenes NOT DETECTED NOT DETECTED Final   A.calcoaceticus-baumannii NOT DETECTED NOT DETECTED Final   Bacteroides fragilis NOT DETECTED NOT DETECTED Final   Enterobacterales NOT DETECTED NOT DETECTED Final   Enterobacter cloacae complex NOT DETECTED NOT  DETECTED Final   Escherichia coli NOT DETECTED NOT DETECTED Final   Klebsiella aerogenes NOT DETECTED NOT DETECTED Final   Klebsiella oxytoca NOT DETECTED NOT DETECTED Final   Klebsiella pneumoniae NOT DETECTED NOT  DETECTED Final   Proteus species NOT DETECTED NOT DETECTED Final   Salmonella species NOT DETECTED NOT DETECTED Final   Serratia marcescens NOT DETECTED NOT DETECTED Final   Haemophilus influenzae NOT DETECTED NOT DETECTED Final   Neisseria meningitidis NOT DETECTED NOT DETECTED Final   Pseudomonas aeruginosa NOT DETECTED NOT DETECTED Final   Stenotrophomonas maltophilia NOT DETECTED NOT DETECTED Final   Candida albicans NOT DETECTED NOT DETECTED Final   Candida auris NOT DETECTED NOT DETECTED Final   Candida glabrata NOT DETECTED NOT DETECTED Final   Candida krusei NOT DETECTED NOT DETECTED Final   Candida parapsilosis NOT DETECTED NOT DETECTED Final   Candida tropicalis NOT DETECTED NOT DETECTED Final   Cryptococcus neoformans/gattii NOT DETECTED NOT DETECTED Final    Comment: Performed at [facility], [address] [place], [place] [address]  Blood Culture (routine x 2)     Status: Abnormal   Collection Time: 11/10/23  9:20 PM   Specimen: BLOOD RIGHT FOREARM  Result Value Ref Range Status   Specimen Description BLOOD RIGHT FOREARM  Final   Special Requests   Final    BOTTLES DRAWN AEROBIC AND ANAEROBIC Blood Culture adequate volume   Culture  Setup Time   Final    GRAM POSITIVE COCCI IN CHAINS IN BOTH AEROBIC AND ANAEROBIC BOTTLES CRITICAL RESULT CALLED TO, READ BACK BY AND VERIFIED WITH: E. SINCLAIR PHARMD, AT 1025 11/11/23 D. VANHOOK    Culture (A)  Final    STREPTOCOCCUS GROUP G SUSCEPTIBILITIES PERFORMED ON PREVIOUS CULTURE WITHIN THE LAST 5 DAYS. Performed at [facility], [address], [place], [place] [address]    Report Status 11/13/2023 FINAL  Final  Resp panel by RT-PCR (RSV, Flu A&B, Covid) Anterior Nasal Swab     Status: None    Collection Time: 11/10/23 11:21 PM   Specimen: Anterior Nasal Swab  Result Value Ref Range Status   SARS Coronavirus 2 by RT PCR NEGATIVE NEGATIVE Final   Influenza A by PCR NEGATIVE NEGATIVE Final   Influenza B by PCR NEGATIVE NEGATIVE Final    Comment: (NOTE) The Xpert Xpress SARS-CoV-2/FLU/RSV plus assay is intended as an aid in the diagnosis of influenza from Nasopharyngeal swab specimens and should not be used as a sole basis for treatment. Nasal washings and aspirates are unacceptable for Xpert Xpress SARS-CoV-2/FLU/RSV testing.  Fact Sheet for Patients: BloggerCourse.com  Fact Sheet for Healthcare Providers: SeriousBroker.it  This test is not yet approved or cleared by the [place] FDA and has been authorized for detection and/or diagnosis of SARS-CoV-2 by FDA under an Emergency Use Authorization (EUA). This EUA will remain in effect (meaning this test can be used) for the duration of the COVID-19 declaration under Section 564(b)(1) of the Act, 21 U.S.C. section 360bbb-3(b)(1), unless the authorization is terminated or revoked.     Resp Syncytial Virus by PCR NEGATIVE NEGATIVE Final    Comment: (NOTE) Fact Sheet for Patients: BloggerCourse.com  Fact Sheet for Healthcare Providers: SeriousBroker.it  This test is not yet approved or cleared by the [place] FDA and has been authorized for detection and/or diagnosis of SARS-CoV-2 by FDA under an Emergency Use Authorization (EUA). This EUA will remain in effect (meaning this test can be used) for the duration of the COVID-19 declaration under Section 564(b)(1) of the Act, 21 U.S.C. section 360bbb-3(b)(1), unless the authorization is terminated or revoked.  Performed at [facility], [address], [place], [place] [address]   Culture,  blood (Routine X 2) w Reflex to ID Panel     Status: None (Preliminary  result)   Collection Time: 11/12/23 11:14 AM   Specimen: BLOOD LEFT ARM  Result Value Ref Range Status   Specimen Description BLOOD LEFT ARM  Final   Special Requests   Final    BOTTLES DRAWN AEROBIC AND ANAEROBIC Blood Culture results may not be optimal due to an inadequate volume of blood received in culture bottles   Culture   Final    NO GROWTH 2 DAYS Performed at [facility], [address] [place], [place] [address]    Report Status PENDING  Incomplete  Culture, blood (Routine X 2) w Reflex to ID Panel     Status: None (Preliminary result)   Collection Time: 11/12/23 11:25 AM   Specimen: BLOOD LEFT HAND  Result Value Ref Range Status   Specimen Description BLOOD LEFT HAND  Final   Special Requests   Final    BOTTLES DRAWN AEROBIC AND ANAEROBIC Blood Culture results may not be optimal due to an inadequate volume of blood received in culture bottles   Culture   Final    NO GROWTH 2 DAYS Performed at [facility], [address], [place], [place] [address]    Report Status PENDING  Incomplete         Radiology Studies: ECHOCARDIOGRAM COMPLETE Result Date: 11/13/2023    ECHOCARDIOGRAM REPORT   Patient Name:   Anthony Case Date of Exam: 11/13/2023 Medical Rec #:  [id number]          Height:       71.0 in Accession #:    [id number]         Weight:       257.6 lb Date of Birth:  07-Mar-1951          BSA:          2.348 m Patient Age:    72 years           BP:           154/71 mmHg Patient Gender: M                  HR:           62 bpm. Exam Location:  Inpatient Procedure: 2D Echo, Color Doppler, Cardiac Doppler and Intracardiac            Opacification Agent (Both Spectral and Color Flow Doppler were            utilized during procedure). Indications:    Endocarditis I38  History:        Patient has prior history of Echocardiogram examinations, most                 recent 08/11/2023.  Sonographer:    Harriette Bouillon RDCS Referring Phys: [facility]  Sonographer  Comments: Technically difficult study due to poor echo windows. IMPRESSIONS  1. Technically difficult study with reduced echo windows  2. Left ventricular ejection fraction, by estimation, is 40 to 45%. Left ventricular ejection fraction by 2D MOD biplane is 41.2 %. The left ventricle has mildly decreased function. The left ventricle demonstrates global hypokinesis. There is mild left ventricular hypertrophy. Left ventricular diastolic parameters are consistent with Grade I diastolic dysfunction (impaired relaxation).  3. Right ventricular systolic function is normal. The right ventricular size is normal. Tricuspid regurgitation signal is inadequate for assessing PA pressure.  4. The mitral valve  is grossly normal. No evidence of mitral valve regurgitation.  5. The aortic valve is calcified. There is moderate calcification of the aortic valve. Aortic valve regurgitation is not visualized. Moderate aortic valve stenosis. Aortic valve area, by VTI measures 1.40 cm. Aortic valve mean gradient measures 10.0 mmHg. Aortic valve Vmax measures 1.88 m/s.  6. Aortic dilatation noted. There is borderline dilatation of the ascending aorta, measuring 39 mm. Comparison(s): No significant change from prior study. 08/11/2023: LVEF 40-45%. Conclusion(s)/Recommendation(s): No evidence of valvular vegetations on this transthoracic echocardiogram. Consider a transesophageal echocardiogram to exclude infective endocarditis if clinically indicated. FINDINGS  Left Ventricle: Left ventricular ejection fraction, by estimation, is 40 to 45%. Left ventricular ejection fraction by 2D MOD biplane is 41.2 %. The left ventricle has mildly decreased function. The left ventricle demonstrates global hypokinesis. Definity contrast agent was given IV to delineate the left ventricular endocardial borders. The left ventricular internal cavity size was normal in size. There is mild left ventricular hypertrophy. Abnormal (paradoxical) septal motion,  consistent with left bundle branch block. Left ventricular diastolic parameters are consistent with Grade I diastolic dysfunction (impaired relaxation). Indeterminate filling pressures. Right Ventricle: The right ventricular size is normal. No increase in right ventricular wall thickness. Right ventricular systolic function is normal. Tricuspid regurgitation signal is inadequate for assessing PA pressure. Left Atrium: Left atrial size was normal in size. Right Atrium: Right atrial size was normal in size. Pericardium: There is no evidence of pericardial effusion. Mitral Valve: The mitral valve is grossly normal. No evidence of mitral valve regurgitation. Tricuspid Valve: The tricuspid valve is not well visualized. Tricuspid valve regurgitation is trivial. Aortic Valve: The aortic valve is calcified. There is moderate calcification of the aortic valve. Aortic valve regurgitation is not visualized. Moderate aortic stenosis is present. Aortic valve mean gradient measures 10.0 mmHg. Aortic valve peak gradient  measures 14.1 mmHg. Aortic valve area, by VTI measures 1.40 cm. Pulmonic Valve: The pulmonic valve was not well visualized. Pulmonic valve regurgitation is not visualized. Aorta: Aortic dilatation noted. There is borderline dilatation of the ascending aorta, measuring 39 mm. Venous: The inferior vena cava was not well visualized. IAS/Shunts: No atrial level shunt detected by color flow Doppler.  LEFT VENTRICLE PLAX 2D                        Biplane EF (MOD) LVIDd:         5.70 cm         LV Biplane EF:   Left LVIDs:         3.40 cm                          ventricular LV PW:         1.20 cm                          ejection LV IVS:        1.20 cm                          fraction by LVOT diam:     2.10 cm                          2D MOD LV SV:         82  biplane is LV SV Index:   35                               41.2 %. LVOT Area:     3.46 cm                                 Diastology                                LV e' medial:    6.31 cm/s LV Volumes (MOD)               LV E/e' medial:  13.7 LV vol d, MOD    134.6 ml      LV e' lateral:   8.92 cm/s A2C:                           LV E/e' lateral: 9.7 LV vol d, MOD    178.7 ml A4C: LV vol s, MOD    83.3 ml A2C: LV vol s, MOD    102.4 ml A4C: LV SV MOD A2C:   51.3 ml LV SV MOD A4C:   178.7 ml LV SV MOD BP:    64.9 ml RIGHT VENTRICLE RV S prime:     14.80 cm/s TAPSE (M-mode): 2.5 cm LEFT ATRIUM           Index LA diam:      4.80 cm 2.04 cm/m LA Vol (A4C): 77.5 ml 33.01 ml/m  AORTIC VALVE AV Area (Vmax):    1.59 cm AV Area (Vmean):   1.40 cm AV Area (VTI):     1.40 cm AV Vmax:           188.00 cm/s AV Vmean:          155.000 cm/s AV VTI:            0.582 m AV Peak Grad:      14.1 mmHg AV Mean Grad:      10.0 mmHg LVOT Vmax:         86.10 cm/s LVOT Vmean:        62.800 cm/s LVOT VTI:          0.236 m LVOT/AV VTI ratio: 0.41  AORTA Ao Root diam: 3.70 cm Ao Asc diam:  3.90 cm MITRAL VALVE MV Area (PHT): 2.19 cm     SHUNTS MV Decel Time: 346 msec     Systemic VTI:  0.24 m MV E velocity: 86.50 cm/s   Systemic Diam: 2.10 cm MV A velocity: 102.00 cm/s MV E/A ratio:  0.85 Zoila Shutter MD Electronically signed by Zoila Shutter MD Signature Date/Time: 11/13/2023/12:39:02 PM    Final         Scheduled Meds:  atorvastatin  20 mg Oral Daily   cholecalciferol  1,000 Units Oral Daily   dapagliflozin propanediol  10 mg Oral Daily   ezetimibe  10 mg Oral Daily   folic acid  1 mg Oral Daily   gabapentin  100 mg Oral TID   insulin aspart  0-5 Units Subcutaneous QHS   insulin aspart  0-6 Units Subcutaneous TID WC   insulin glargine  40 Units Subcutaneous QHS   metoprolol succinate  50 mg Oral Daily   rivaroxaban  20 mg Oral QHS   sacubitril-valsartan  1 tablet Oral BID   sodium chloride flush  3 mL Intravenous Q12H   Continuous Infusions:   ceFAZolin (ANCEF) IV 2 g (11/15/23 0600)          Glade Lloyd, MD [facility] 11/15/2023, 7:52 AM

## 2023-11-15 NOTE — Progress Notes (Signed)
 PROGRESS NOTE    Anthony Case  ZOX:[id number] DOB: 07-27-1951 DOA: 11/10/2023 PCP: Garlan Fillers, MD   Brief Narrative:  73 y.o. male with medical history significant for hypertension, insulin-dependent diabetes mellitus, OSA, history of PE on Xarelto, HFmrEF, psoriatic arthritis, and admission last month for diabetic foot ulcer, presented with shaking chills and severe right groin pain. On presentation, he was febrile, tachycardic and hypotensive.  Chest x-ray and right foot x-ray were negative.  CT of abdomen and pelvis notable for mild right inguinal adenopathy.  WBC 13,500, negative COVID/RSV/influenza PCR, lactic acid 2.3.  He was started on IV fluids and antibiotics.  Subsequently, he had group G Streptococcus bacteremia.  ID was consulted.  MRI of right foot showed cellulitis with no osteomyelitis or abscess.  TTE showed calcified aortic valve with aortic stenosis.  TEE possibly planned for Monday.  Assessment & Plan:   Severe sepsis: Present on admission Right foot cellulitis Group G Streptococcus bacteremia -ID following.  Currently on IV Ancef. -MRI of right foot showed cellulitis with no osteomyelitis or abscess.  TTE showed calcified aortic valve with aortic stenosis.  TEE possibly planned for Monday. -Currently afebrile and hemodynamically stable  Chronic systolic heart failure Hypertension-hyperlipidemia -Compensated.  Continue Entresto, Farxiga and Toprol XL.  Continue statin and ezetimibe -Strict input and output.  Daily weights.  Fluid restriction.  Leukocytosis -Resolved  Diabetes mellitus type 2 with hyperglycemia -Continue long-acting insulin along with CBGs with SSI.  Carb modified diet  History of DVT/PE -Continue Xarelto  Anemia of chronic disease -From chronic illnesses.  Monitor intermittently  Psoriatic arthritis -Methotrexate and Cosentyx held due to active infection  Cholelithiasis -Needs elective outpatient follow-up with general surgery  if symptomatic  Obesity class I -Outpatient follow-up   DVT prophylaxis: Xarelto Code Status: Full Family Communication: None at bedside Disposition Plan: Status is: Inpatient Remains inpatient appropriate because: Of severity of illness  Consultants: ID  Procedures: 2D echo  Antimicrobials:  Anti-infectives (From admission, onward)    Start     Dose/Rate Route Frequency Ordered Stop   11/13/23 1400  ceFAZolin (ANCEF) IVPB 2g/100 mL premix        2 g 200 mL/hr over 30 Minutes Intravenous Every 8 hours 11/13/23 1129     11/12/23 0500  vancomycin (VANCOREADY) IVPB 1500 mg/300 mL  Status:  Discontinued        1,500 mg 150 mL/hr over 120 Minutes Intravenous Every 24 hours 11/11/23 0449 11/11/23 1033   11/11/23 1300  cefTRIAXone (ROCEPHIN) 2 g in sodium chloride 0.9 % 100 mL IVPB  Status:  Discontinued        2 g 200 mL/hr over 30 Minutes Intravenous Every 24 hours 11/11/23 1033 11/13/23 1129   11/11/23 1000  metroNIDAZOLE (FLAGYL) IVPB 500 mg  Status:  Discontinued        500 mg 100 mL/hr over 60 Minutes Intravenous Every 12 hours 11/11/23 0221 11/11/23 1033   11/11/23 0600  ceFEPIme (MAXIPIME) 2 g in sodium chloride 0.9 % 100 mL IVPB  Status:  Discontinued        2 g 200 mL/hr over 30 Minutes Intravenous Every 8 hours 11/11/23 0248 11/11/23 1033   11/11/23 0230  vancomycin (VANCOCIN) IVPB 1000 mg/200 mL premix  Status:  Discontinued        1,000 mg 200 mL/hr over 60 Minutes Intravenous  Once 11/11/23 0221 11/11/23 0229   11/11/23 0230  vancomycin (VANCOREADY) IVPB 2000 mg/400 mL  2,000 mg 200 mL/hr over 120 Minutes Intravenous  Once 11/11/23 0229 11/11/23 0446   11/10/23 2230  ceFEPIme (MAXIPIME) 2 g in sodium chloride 0.9 % 100 mL IVPB        2 g 200 mL/hr over 30 Minutes Intravenous  Once 11/10/23 2220 11/10/23 2301   11/10/23 2230  metroNIDAZOLE (FLAGYL) IVPB 500 mg        500 mg 100 mL/hr over 60 Minutes Intravenous  Once 11/10/23 2220 11/10/23 2350         Subjective: Patient seen and examined at bedside.  Denies any chest pain, fever or shortness of breath.  Objective: Vitals:   11/15/23 0049 11/15/23 0430 11/15/23 0447 11/15/23 0718  BP: 128/76  121/69 138/72  Pulse: 62  74 (!) 55  Resp: 19  16 14   Temp: (!) 97.5 F (36.4 C)  (!) 97.5 F (36.4 C) (!) 97.4 F (36.3 C)  TempSrc: Oral  Axillary Oral  SpO2: 95%  96% 94%  Weight:  114.5 kg    Height:        Intake/Output Summary (Last 24 hours) at 11/15/2023 0752 Last data filed at 11/14/2023 2121 Gross per 24 hour  Intake 540 ml  Output --  Net 540 ml   Filed Weights   11/13/23 0513 11/14/23 0445 11/15/23 0430  Weight: 116.8 kg 117.8 kg 114.5 kg    Examination:  General exam: Appears calm and comfortable.  On room air. Respiratory system: Bilateral decreased breath sounds at bases Cardiovascular system: S1 & S2 heard, Rate controlled Gastrointestinal system: Abdomen is obese, distended slightly, soft and nontender.  Bowel sounds heard. Extremities: No cyanosis, clubbing, edema  Central nervous system: Alert and awake. No focal neurological deficits. Moving extremities Skin: Right great toe wound with almost no erythema or tenderness  psychiatry: Normal mood, affect and judgement   Data Reviewed: I have personally reviewed following labs and imaging studies  CBC: Recent Labs  Lab 11/10/23 2109 11/11/23 0244 11/11/23 0432 11/12/23 0521 11/13/23 0300 11/14/23 0252  WBC 13.5* 18.3* 17.6* 12.0* 12.0* 7.3  NEUTROABS 12.0* 16.1*  --   --   --   --   HGB 14.1 13.5 12.9* 12.0* 13.0 12.8*  HCT 42.2 39.8 38.7* 37.1* 37.4* 37.5*  MCV 92.1 92.6 93.0 93.0 89.3 89.9  PLT 200 186 166 174 187 216   Basic Metabolic Panel: Recent Labs  Lab 11/10/23 2109 11/11/23 0432 11/11/23 1910 11/12/23 0521 11/13/23 0300 11/14/23 0252  NA 140 137  --  133* 137 137  K 4.4 4.0  --  3.9 4.3 4.0  CL 106 107  --  108 108 111  CO2 22 21*  --  22 21* 22  GLUCOSE 126* 65*  --   138* 212* 132*  BUN 22 21  --  24* 32* 25*  CREATININE 1.19 1.17  --  1.24 1.15 0.97  CALCIUM 9.8 8.8*  --  8.5* 8.9 8.6*  MG  --  1.3* 2.0 2.1  --   --    GFR: Estimated Creatinine Clearance: 87.3 mL/min (by C-G formula based on SCr of 0.97 mg/dL). Liver Function Tests: Recent Labs  Lab 11/10/23 2109 11/11/23 0432  AST 27 23  ALT 24 20  ALKPHOS 45 30*  BILITOT 1.6* 1.8*  PROT 6.5 5.2*  ALBUMIN 3.5 2.8*   No results for input(s): "LIPASE", "AMYLASE" in the last 168 hours. No results for input(s): "AMMONIA" in the last 168 hours. Coagulation Profile: Recent Labs  Lab  11/10/23 2109  INR 2.1*   Cardiac Enzymes: No results for input(s): "CKTOTAL", "CKMB", "CKMBINDEX", "TROPONINI" in the last 168 hours. BNP (last 3 results) No results for input(s): "PROBNP" in the last 8760 hours. HbA1C: No results for input(s): "HGBA1C" in the last 72 hours. CBG: Recent Labs  Lab 11/14/23 0602 11/14/23 1130 11/14/23 1632 11/14/23 2013 11/15/23 0637  GLUCAP 110* 80 190* 177* 101*   Lipid Profile: No results for input(s): "CHOL", "HDL", "LDLCALC", "TRIG", "CHOLHDL", "LDLDIRECT" in the last 72 hours. Thyroid Function Tests: No results for input(s): "TSH", "T4TOTAL", "FREET4", "T3FREE", "THYROIDAB" in the last 72 hours. Anemia Panel: No results for input(s): "VITAMINB12", "FOLATE", "FERRITIN", "TIBC", "IRON", "RETICCTPCT" in the last 72 hours. Sepsis Labs: Recent Labs  Lab 11/10/23 2105 11/10/23 2141 11/10/23 2333 11/11/23 0432  LATICACIDVEN 1.9 2.2* 2.3* 1.3    Recent Results (from the past 240 hours)  Blood Culture (routine x 2)     Status: Abnormal   Collection Time: 11/10/23  9:09 PM   Specimen: BLOOD LEFT FOREARM  Result Value Ref Range Status   Specimen Description BLOOD LEFT FOREARM  Final   Special Requests   Final    BOTTLES DRAWN AEROBIC AND ANAEROBIC Blood Culture adequate volume   Culture  Setup Time   Final    GRAM POSITIVE COCCI IN CHAINS IN BOTH AEROBIC AND  ANAEROBIC BOTTLES CRITICAL RESULT CALLED TO, READ BACK BY AND VERIFIED WITH: E. SINCLAIR PHARMD, AT 1025 11/11/23 D. VANHOOK Performed at [facility], [address], [place], [place] [address]    Culture STREPTOCOCCUS GROUP G (A)  Final   Report Status 11/13/2023 FINAL  Final   Organism ID, Bacteria STREPTOCOCCUS GROUP G  Final      Susceptibility   Streptococcus group g - MIC*    CLINDAMYCIN <=0.25 SENSITIVE Sensitive     AMPICILLIN <=0.25 SENSITIVE Sensitive     ERYTHROMYCIN <=0.12 SENSITIVE Sensitive     VANCOMYCIN 0.5 SENSITIVE Sensitive     CEFTRIAXONE <=0.12 SENSITIVE Sensitive     LEVOFLOXACIN 0.5 SENSITIVE Sensitive     PENICILLIN <=0.06 SENSITIVE Sensitive     * STREPTOCOCCUS GROUP G  Blood Culture ID Panel (Reflexed)     Status: Abnormal   Collection Time: 11/10/23  9:09 PM  Result Value Ref Range Status   Enterococcus faecalis NOT DETECTED NOT DETECTED Final   Enterococcus Faecium NOT DETECTED NOT DETECTED Final   Listeria monocytogenes NOT DETECTED NOT DETECTED Final   Staphylococcus species NOT DETECTED NOT DETECTED Final   Staphylococcus aureus (BCID) NOT DETECTED NOT DETECTED Final   Staphylococcus epidermidis NOT DETECTED NOT DETECTED Final   Staphylococcus lugdunensis NOT DETECTED NOT DETECTED Final   Streptococcus species DETECTED (A) NOT DETECTED Final    Comment: Not Enterococcus species, Streptococcus agalactiae, Streptococcus pyogenes, or Streptococcus pneumoniae. CRITICAL RESULT CALLED TO, READ BACK BY AND VERIFIED WITH: E. SINCLAIR PHARMD, AT 1025 11/11/23 D. VANHOOK    Streptococcus agalactiae NOT DETECTED NOT DETECTED Final   Streptococcus pneumoniae NOT DETECTED NOT DETECTED Final   Streptococcus pyogenes NOT DETECTED NOT DETECTED Final   A.calcoaceticus-baumannii NOT DETECTED NOT DETECTED Final   Bacteroides fragilis NOT DETECTED NOT DETECTED Final   Enterobacterales NOT DETECTED NOT DETECTED Final   Enterobacter cloacae complex NOT DETECTED NOT  DETECTED Final   Escherichia coli NOT DETECTED NOT DETECTED Final   Klebsiella aerogenes NOT DETECTED NOT DETECTED Final   Klebsiella oxytoca NOT DETECTED NOT DETECTED Final   Klebsiella pneumoniae NOT DETECTED NOT  DETECTED Final   Proteus species NOT DETECTED NOT DETECTED Final   Salmonella species NOT DETECTED NOT DETECTED Final   Serratia marcescens NOT DETECTED NOT DETECTED Final   Haemophilus influenzae NOT DETECTED NOT DETECTED Final   Neisseria meningitidis NOT DETECTED NOT DETECTED Final   Pseudomonas aeruginosa NOT DETECTED NOT DETECTED Final   Stenotrophomonas maltophilia NOT DETECTED NOT DETECTED Final   Candida albicans NOT DETECTED NOT DETECTED Final   Candida auris NOT DETECTED NOT DETECTED Final   Candida glabrata NOT DETECTED NOT DETECTED Final   Candida krusei NOT DETECTED NOT DETECTED Final   Candida parapsilosis NOT DETECTED NOT DETECTED Final   Candida tropicalis NOT DETECTED NOT DETECTED Final   Cryptococcus neoformans/gattii NOT DETECTED NOT DETECTED Final    Comment: Performed at [facility], [address] [place], [place] [address]  Blood Culture (routine x 2)     Status: Abnormal   Collection Time: 11/10/23  9:20 PM   Specimen: BLOOD RIGHT FOREARM  Result Value Ref Range Status   Specimen Description BLOOD RIGHT FOREARM  Final   Special Requests   Final    BOTTLES DRAWN AEROBIC AND ANAEROBIC Blood Culture adequate volume   Culture  Setup Time   Final    GRAM POSITIVE COCCI IN CHAINS IN BOTH AEROBIC AND ANAEROBIC BOTTLES CRITICAL RESULT CALLED TO, READ BACK BY AND VERIFIED WITH: E. SINCLAIR PHARMD, AT 1025 11/11/23 D. VANHOOK    Culture (A)  Final    STREPTOCOCCUS GROUP G SUSCEPTIBILITIES PERFORMED ON PREVIOUS CULTURE WITHIN THE LAST 5 DAYS. Performed at [facility], [address], [place], [place] [address]    Report Status 11/13/2023 FINAL  Final  Resp panel by RT-PCR (RSV, Flu A&B, Covid) Anterior Nasal Swab     Status: None    Collection Time: 11/10/23 11:21 PM   Specimen: Anterior Nasal Swab  Result Value Ref Range Status   SARS Coronavirus 2 by RT PCR NEGATIVE NEGATIVE Final   Influenza A by PCR NEGATIVE NEGATIVE Final   Influenza B by PCR NEGATIVE NEGATIVE Final    Comment: (NOTE) The Xpert Xpress SARS-CoV-2/FLU/RSV plus assay is intended as an aid in the diagnosis of influenza from Nasopharyngeal swab specimens and should not be used as a sole basis for treatment. Nasal washings and aspirates are unacceptable for Xpert Xpress SARS-CoV-2/FLU/RSV testing.  Fact Sheet for Patients: BloggerCourse.com  Fact Sheet for Healthcare Providers: SeriousBroker.it  This test is not yet approved or cleared by the [place] FDA and has been authorized for detection and/or diagnosis of SARS-CoV-2 by FDA under an Emergency Use Authorization (EUA). This EUA will remain in effect (meaning this test can be used) for the duration of the COVID-19 declaration under Section 564(b)(1) of the Act, 21 U.S.C. section 360bbb-3(b)(1), unless the authorization is terminated or revoked.     Resp Syncytial Virus by PCR NEGATIVE NEGATIVE Final    Comment: (NOTE) Fact Sheet for Patients: BloggerCourse.com  Fact Sheet for Healthcare Providers: SeriousBroker.it  This test is not yet approved or cleared by the [place] FDA and has been authorized for detection and/or diagnosis of SARS-CoV-2 by FDA under an Emergency Use Authorization (EUA). This EUA will remain in effect (meaning this test can be used) for the duration of the COVID-19 declaration under Section 564(b)(1) of the Act, 21 U.S.C. section 360bbb-3(b)(1), unless the authorization is terminated or revoked.  Performed at [facility], [address], [place], [place] [address]   Culture,  blood (Routine X 2) w Reflex to ID Panel     Status: None (Preliminary  result)   Collection Time: 11/12/23 11:14 AM   Specimen: BLOOD LEFT ARM  Result Value Ref Range Status   Specimen Description BLOOD LEFT ARM  Final   Special Requests   Final    BOTTLES DRAWN AEROBIC AND ANAEROBIC Blood Culture results may not be optimal due to an inadequate volume of blood received in culture bottles   Culture   Final    NO GROWTH 2 DAYS Performed at [facility], [address] [place], [place] [address]    Report Status PENDING  Incomplete  Culture, blood (Routine X 2) w Reflex to ID Panel     Status: None (Preliminary result)   Collection Time: 11/12/23 11:25 AM   Specimen: BLOOD LEFT HAND  Result Value Ref Range Status   Specimen Description BLOOD LEFT HAND  Final   Special Requests   Final    BOTTLES DRAWN AEROBIC AND ANAEROBIC Blood Culture results may not be optimal due to an inadequate volume of blood received in culture bottles   Culture   Final    NO GROWTH 2 DAYS Performed at [facility], [address], [place], [place] [address]    Report Status PENDING  Incomplete         Radiology Studies: ECHOCARDIOGRAM COMPLETE Result Date: 11/13/2023    ECHOCARDIOGRAM REPORT   Patient Name:   Anthony Case Date of Exam: 11/13/2023 Medical Rec #:  [id number]          Height:       71.0 in Accession #:    [id number]         Weight:       257.6 lb Date of Birth:  07-Mar-1951          BSA:          2.348 m Patient Age:    72 years           BP:           154/71 mmHg Patient Gender: M                  HR:           62 bpm. Exam Location:  Inpatient Procedure: 2D Echo, Color Doppler, Cardiac Doppler and Intracardiac            Opacification Agent (Both Spectral and Color Flow Doppler were            utilized during procedure). Indications:    Endocarditis I38  History:        Patient has prior history of Echocardiogram examinations, most                 recent 08/11/2023.  Sonographer:    Harriette Bouillon RDCS Referring Phys: [facility]  Sonographer  Comments: Technically difficult study due to poor echo windows. IMPRESSIONS  1. Technically difficult study with reduced echo windows  2. Left ventricular ejection fraction, by estimation, is 40 to 45%. Left ventricular ejection fraction by 2D MOD biplane is 41.2 %. The left ventricle has mildly decreased function. The left ventricle demonstrates global hypokinesis. There is mild left ventricular hypertrophy. Left ventricular diastolic parameters are consistent with Grade I diastolic dysfunction (impaired relaxation).  3. Right ventricular systolic function is normal. The right ventricular size is normal. Tricuspid regurgitation signal is inadequate for assessing PA pressure.  4. The mitral valve  is grossly normal. No evidence of mitral valve regurgitation.  5. The aortic valve is calcified. There is moderate calcification of the aortic valve. Aortic valve regurgitation is not visualized. Moderate aortic valve stenosis. Aortic valve area, by VTI measures 1.40 cm. Aortic valve mean gradient measures 10.0 mmHg. Aortic valve Vmax measures 1.88 m/s.  6. Aortic dilatation noted. There is borderline dilatation of the ascending aorta, measuring 39 mm. Comparison(s): No significant change from prior study. 08/11/2023: LVEF 40-45%. Conclusion(s)/Recommendation(s): No evidence of valvular vegetations on this transthoracic echocardiogram. Consider a transesophageal echocardiogram to exclude infective endocarditis if clinically indicated. FINDINGS  Left Ventricle: Left ventricular ejection fraction, by estimation, is 40 to 45%. Left ventricular ejection fraction by 2D MOD biplane is 41.2 %. The left ventricle has mildly decreased function. The left ventricle demonstrates global hypokinesis. Definity contrast agent was given IV to delineate the left ventricular endocardial borders. The left ventricular internal cavity size was normal in size. There is mild left ventricular hypertrophy. Abnormal (paradoxical) septal motion,  consistent with left bundle branch block. Left ventricular diastolic parameters are consistent with Grade I diastolic dysfunction (impaired relaxation). Indeterminate filling pressures. Right Ventricle: The right ventricular size is normal. No increase in right ventricular wall thickness. Right ventricular systolic function is normal. Tricuspid regurgitation signal is inadequate for assessing PA pressure. Left Atrium: Left atrial size was normal in size. Right Atrium: Right atrial size was normal in size. Pericardium: There is no evidence of pericardial effusion. Mitral Valve: The mitral valve is grossly normal. No evidence of mitral valve regurgitation. Tricuspid Valve: The tricuspid valve is not well visualized. Tricuspid valve regurgitation is trivial. Aortic Valve: The aortic valve is calcified. There is moderate calcification of the aortic valve. Aortic valve regurgitation is not visualized. Moderate aortic stenosis is present. Aortic valve mean gradient measures 10.0 mmHg. Aortic valve peak gradient  measures 14.1 mmHg. Aortic valve area, by VTI measures 1.40 cm. Pulmonic Valve: The pulmonic valve was not well visualized. Pulmonic valve regurgitation is not visualized. Aorta: Aortic dilatation noted. There is borderline dilatation of the ascending aorta, measuring 39 mm. Venous: The inferior vena cava was not well visualized. IAS/Shunts: No atrial level shunt detected by color flow Doppler.  LEFT VENTRICLE PLAX 2D                        Biplane EF (MOD) LVIDd:         5.70 cm         LV Biplane EF:   Left LVIDs:         3.40 cm                          ventricular LV PW:         1.20 cm                          ejection LV IVS:        1.20 cm                          fraction by LVOT diam:     2.10 cm                          2D MOD LV SV:         82  biplane is LV SV Index:   35                               41.2 %. LVOT Area:     3.46 cm                                 Diastology                                LV e' medial:    6.31 cm/s LV Volumes (MOD)               LV E/e' medial:  13.7 LV vol d, MOD    134.6 ml      LV e' lateral:   8.92 cm/s A2C:                           LV E/e' lateral: 9.7 LV vol d, MOD    178.7 ml A4C: LV vol s, MOD    83.3 ml A2C: LV vol s, MOD    102.4 ml A4C: LV SV MOD A2C:   51.3 ml LV SV MOD A4C:   178.7 ml LV SV MOD BP:    64.9 ml RIGHT VENTRICLE RV S prime:     14.80 cm/s TAPSE (M-mode): 2.5 cm LEFT ATRIUM           Index LA diam:      4.80 cm 2.04 cm/m LA Vol (A4C): 77.5 ml 33.01 ml/m  AORTIC VALVE AV Area (Vmax):    1.59 cm AV Area (Vmean):   1.40 cm AV Area (VTI):     1.40 cm AV Vmax:           188.00 cm/s AV Vmean:          155.000 cm/s AV VTI:            0.582 m AV Peak Grad:      14.1 mmHg AV Mean Grad:      10.0 mmHg LVOT Vmax:         86.10 cm/s LVOT Vmean:        62.800 cm/s LVOT VTI:          0.236 m LVOT/AV VTI ratio: 0.41  AORTA Ao Root diam: 3.70 cm Ao Asc diam:  3.90 cm MITRAL VALVE MV Area (PHT): 2.19 cm     SHUNTS MV Decel Time: 346 msec     Systemic VTI:  0.24 m MV E velocity: 86.50 cm/s   Systemic Diam: 2.10 cm MV A velocity: 102.00 cm/s MV E/A ratio:  0.85 Zoila Shutter MD Electronically signed by Zoila Shutter MD Signature Date/Time: 11/13/2023/12:39:02 PM    Final         Scheduled Meds:  atorvastatin  20 mg Oral Daily   cholecalciferol  1,000 Units Oral Daily   dapagliflozin propanediol  10 mg Oral Daily   ezetimibe  10 mg Oral Daily   folic acid  1 mg Oral Daily   gabapentin  100 mg Oral TID   insulin aspart  0-5 Units Subcutaneous QHS   insulin aspart  0-6 Units Subcutaneous TID WC   insulin glargine  40 Units Subcutaneous QHS   metoprolol succinate  50 mg Oral Daily   rivaroxaban  20 mg Oral QHS   sacubitril-valsartan  1 tablet Oral BID   sodium chloride flush  3 mL Intravenous Q12H   Continuous Infusions:   ceFAZolin (ANCEF) IV 2 g (11/15/23 0600)          Glade Lloyd, MD [facility] 11/15/2023, 7:52 AM

## 2023-11-15 NOTE — Plan of Care (Signed)
  Problem: Health Behavior/Discharge Planning: Goal: Ability to identify and utilize available resources and services will improve Outcome: Progressing   Problem: Health Behavior/Discharge Planning: Goal: Ability to manage health-related needs will improve Outcome: Progressing   Problem: Metabolic: Goal: Ability to maintain appropriate glucose levels will improve Outcome: Progressing

## 2023-11-16 ENCOUNTER — Inpatient Hospital Stay (HOSPITAL_COMMUNITY)

## 2023-11-16 ENCOUNTER — Inpatient Hospital Stay (HOSPITAL_COMMUNITY): Admitting: Anesthesiology

## 2023-11-16 ENCOUNTER — Ambulatory Visit: Admitting: Podiatry

## 2023-11-16 ENCOUNTER — Encounter (HOSPITAL_COMMUNITY)
Admission: EM | Disposition: A | Discharge status: Home / Self Care | Payer: Self-pay | Source: Home / Self Care | Attending: Family Medicine

## 2023-11-16 ENCOUNTER — Encounter (HOSPITAL_COMMUNITY): Payer: Self-pay | Admitting: Cardiovascular Disease

## 2023-11-16 DIAGNOSIS — I110 Hypertensive heart disease with heart failure: Secondary | ICD-10-CM | POA: Diagnosis not present

## 2023-11-16 DIAGNOSIS — I509 Heart failure, unspecified: Secondary | ICD-10-CM

## 2023-11-16 DIAGNOSIS — B954 Other streptococcus as the cause of diseases classified elsewhere: Secondary | ICD-10-CM | POA: Diagnosis not present

## 2023-11-16 DIAGNOSIS — R7881 Bacteremia: Secondary | ICD-10-CM

## 2023-11-16 DIAGNOSIS — I340 Nonrheumatic mitral (valve) insufficiency: Secondary | ICD-10-CM

## 2023-11-16 DIAGNOSIS — L03032 Cellulitis of left toe: Secondary | ICD-10-CM

## 2023-11-16 DIAGNOSIS — E119 Type 2 diabetes mellitus without complications: Secondary | ICD-10-CM | POA: Diagnosis not present

## 2023-11-16 DIAGNOSIS — I350 Nonrheumatic aortic (valve) stenosis: Secondary | ICD-10-CM

## 2023-11-16 DIAGNOSIS — B955 Unspecified streptococcus as the cause of diseases classified elsewhere: Secondary | ICD-10-CM | POA: Diagnosis not present

## 2023-11-16 DIAGNOSIS — L97519 Non-pressure chronic ulcer of other part of right foot with unspecified severity: Secondary | ICD-10-CM | POA: Diagnosis not present

## 2023-11-16 HISTORY — PX: TRANSESOPHAGEAL ECHOCARDIOGRAM (CATH LAB): EP1270

## 2023-11-16 LAB — ECHO TEE
AR max vel: 1.97 cm2
AV Area VTI: 1.35 cm2
AV Area mean vel: 1.66 cm2
AV Mean grad: 13.4 mmHg
AV Peak grad: 21.9 mmHg
Ao pk vel: 2.34 m/s

## 2023-11-16 LAB — GLUCOSE, CAPILLARY
Glucose-Capillary: 178 mg/dL — ABNORMAL HIGH (ref 70–99)
Glucose-Capillary: 139 mg/dL — ABNORMAL HIGH (ref 70–99)
Glucose-Capillary: 223 mg/dL — ABNORMAL HIGH (ref 70–99)

## 2023-11-16 SURGERY — TRANSESOPHAGEAL ECHOCARDIOGRAM (TEE) (CATHLAB)
Anesthesia: Monitor Anesthesia Care

## 2023-11-16 MED ORDER — PROPOFOL 500 MG/50ML IV EMUL
INTRAVENOUS | Status: DC | PRN
  Administered 2023-11-16: 55 ug/kg/min via INTRAVENOUS

## 2023-11-16 MED ORDER — PROPOFOL 10 MG/ML IV BOLUS
INTRAVENOUS | Status: DC | PRN
  Administered 2023-11-16: 80 mg via INTRAVENOUS
  Administered 2023-11-16: 20 mg via INTRAVENOUS

## 2023-11-16 MED ORDER — LIDOCAINE 2% (20 MG/ML) 5 ML SYRINGE
INTRAMUSCULAR | Status: DC | PRN
  Administered 2023-11-16: 40 mg via INTRAVENOUS

## 2023-11-16 MED ORDER — INSULIN GLARGINE-YFGN 100 UNIT/ML ~~LOC~~ SOLN
40.0000 [IU] | Freq: Every day | SUBCUTANEOUS | Status: DC
  Filled 2023-11-16: qty 0.4

## 2023-11-16 MED ORDER — SODIUM CHLORIDE 0.9% FLUSH: 3.0000 mL | INTRAVENOUS | Status: DC | PRN

## 2023-11-16 MED ORDER — BETAMETHASONE DIPROPIONATE AUG 0.05 % EX CREA: 1.0000 | TOPICAL_CREAM | CUTANEOUS | Status: DC | PRN

## 2023-11-16 MED ORDER — SODIUM CHLORIDE 0.9% FLUSH: 3.0000 mL | Freq: Two times a day (BID) | INTRAVENOUS | Status: DC

## 2023-11-16 MED ORDER — AMOXICILLIN 500 MG PO CAPS
1000.0000 mg | ORAL_CAPSULE | Freq: Three times a day (TID) | ORAL | Status: DC
Start: 2023-11-16 — End: 2023-11-16

## 2023-11-16 MED ORDER — AMOXICILLIN 500 MG PO CAPS: 1000.0000 mg | ORAL_CAPSULE | Freq: Three times a day (TID) | ORAL | 0 refills | Status: AC

## 2023-11-16 MED ORDER — SODIUM CHLORIDE 0.9 % IV SOLN: INTRAVENOUS | Status: DC | PRN

## 2023-11-16 NOTE — Plan of Care (Signed)
   Problem: Coping: Goal: Ability to adjust to condition or change in health will improve Outcome: Progressing

## 2023-11-16 NOTE — Progress Notes (Signed)
  Echocardiogram Echocardiogram Transesophageal has been performed.  [name] 11/16/2023, 10:52 AM

## 2023-11-16 NOTE — Progress Notes (Signed)
 PROGRESS NOTE    Anthony Case  NFA:[id number] DOB: 30-Jan-1951 DOA: 11/10/2023 PCP: Garlan Fillers, MD   Brief Narrative:  73 y.o. male with medical history significant for hypertension, insulin-dependent diabetes mellitus, OSA, history of PE on Xarelto, HFmrEF, psoriatic arthritis, and admission last month for diabetic foot ulcer, presented with shaking chills and severe right groin pain. On presentation, he was febrile, tachycardic and hypotensive.  Chest x-ray and right foot x-ray were negative.  CT of abdomen and pelvis notable for mild right inguinal adenopathy.  WBC 13,500, negative COVID/RSV/influenza PCR, lactic acid 2.3.  He was started on IV fluids and antibiotics.  Subsequently, he had group G Streptococcus bacteremia.  ID was consulted.  MRI of right foot showed cellulitis with no osteomyelitis or abscess.  TTE showed calcified aortic valve with aortic stenosis.  TEE possibly planned for Monday.  Assessment & Plan:   Severe sepsis: Present on admission Right foot cellulitis Group G Streptococcus bacteremia -ID following.  Currently on IV Ancef. -MRI of right foot showed cellulitis with no osteomyelitis or abscess.  TTE showed calcified aortic valve with aortic stenosis.  TEE possibly planned for today. -Currently afebrile and hemodynamically stable.  Repeat blood cultures have been negative so far.  Chronic systolic heart failure Hypertension-hyperlipidemia -Compensated.  Continue Entresto, Farxiga and Toprol XL.  Continue statin and ezetimibe -Strict input and output.  Daily weights.  Fluid restriction.  Leukocytosis -Resolved  Diabetes mellitus type 2 with hyperglycemia -Continue long-acting insulin along with CBGs with SSI.  Carb modified diet  History of DVT/PE -Continue Xarelto  Anemia of chronic disease -From chronic illnesses.  Monitor intermittently  Psoriatic arthritis -Methotrexate and Cosentyx held due to active infection  Cholelithiasis -Needs  elective outpatient follow-up with general surgery if symptomatic  Obesity class I -Outpatient follow-up   DVT prophylaxis: Xarelto Code Status: Full Family Communication: Wife at bedside Disposition Plan: Status is: Inpatient Remains inpatient appropriate because: Of severity of illness  Consultants: ID  Procedures: 2D echo  Antimicrobials:  Anti-infectives (From admission, onward)    Start     Dose/Rate Route Frequency Ordered Stop   11/13/23 1400  ceFAZolin (ANCEF) IVPB 2g/100 mL premix        2 g 200 mL/hr over 30 Minutes Intravenous Every 8 hours 11/13/23 1129     11/12/23 0500  vancomycin (VANCOREADY) IVPB 1500 mg/300 mL  Status:  Discontinued        1,500 mg 150 mL/hr over 120 Minutes Intravenous Every 24 hours 11/11/23 0449 11/11/23 1033   11/11/23 1300  cefTRIAXone (ROCEPHIN) 2 g in sodium chloride 0.9 % 100 mL IVPB  Status:  Discontinued        2 g 200 mL/hr over 30 Minutes Intravenous Every 24 hours 11/11/23 1033 11/13/23 1129   11/11/23 1000  metroNIDAZOLE (FLAGYL) IVPB 500 mg  Status:  Discontinued        500 mg 100 mL/hr over 60 Minutes Intravenous Every 12 hours 11/11/23 0221 11/11/23 1033   11/11/23 0600  ceFEPIme (MAXIPIME) 2 g in sodium chloride 0.9 % 100 mL IVPB  Status:  Discontinued        2 g 200 mL/hr over 30 Minutes Intravenous Every 8 hours 11/11/23 0248 11/11/23 1033   11/11/23 0230  vancomycin (VANCOCIN) IVPB 1000 mg/200 mL premix  Status:  Discontinued        1,000 mg 200 mL/hr over 60 Minutes Intravenous  Once 11/11/23 0221 11/11/23 0229   11/11/23 0230  vancomycin (VANCOREADY) IVPB  2000 mg/400 mL        2,000 mg 200 mL/hr over 120 Minutes Intravenous  Once 11/11/23 0229 11/11/23 0446   11/10/23 2230  ceFEPIme (MAXIPIME) 2 g in sodium chloride 0.9 % 100 mL IVPB        2 g 200 mL/hr over 30 Minutes Intravenous  Once 11/10/23 2220 11/10/23 2301   11/10/23 2230  metroNIDAZOLE (FLAGYL) IVPB 500 mg        500 mg 100 mL/hr over 60 Minutes  Intravenous  Once 11/10/23 2220 11/10/23 2350        Subjective: Patient seen and examined at bedside.  Denies worsening shortness of breath, fever, vomiting. Objective: Vitals:   11/15/23 1602 11/15/23 1952 11/16/23 0110 11/16/23 0411  BP: (!) 151/78 128/76 101/61 (!) 101/58  Pulse: (!) 49 64 66 72  Resp: 16 16 15 16   Temp:  97.9 F (36.6 C) (!) 97 F (36.1 C) 97.6 F (36.4 C)  TempSrc:  Oral Axillary Oral  SpO2: 96% 95% 97% 94%  Weight:    112.6 kg  Height:        Intake/Output Summary (Last 24 hours) at 11/16/2023 0724 Last data filed at 11/15/2023 2149 Gross per 24 hour  Intake 477 ml  Output --  Net 477 ml   Filed Weights   11/14/23 0445 11/15/23 0430 11/16/23 0411  Weight: 117.8 kg 114.5 kg 112.6 kg    Examination:  General: Currently on room air.  No distress ENT/neck: No thyromegaly.  JVD is not elevated  respiratory: Decreased breath sounds at bases bilaterally with some crackles; no wheezing  CVS: S1-S2 heard, rate controlled currently Abdominal: Soft, nontender, slightly distended; no organomegaly, bowel sounds are heard Extremities: Trace lower extremity edema; no cyanosis  CNS: Awake and alert.  No focal neurologic deficit.  Moves extremities Lymph: No obvious lymphadenopathy Skin: No obvious ecchymosis/lesions  psych: Affect, judgment and mood are normal  musculoskeletal: No obvious joint swelling/deformity     Data Reviewed: I have personally reviewed following labs and imaging studies  CBC: Recent Labs  Lab 11/10/23 2109 11/11/23 0244 11/11/23 0432 11/12/23 0521 11/13/23 0300 11/14/23 0252  WBC 13.5* 18.3* 17.6* 12.0* 12.0* 7.3  NEUTROABS 12.0* 16.1*  --   --   --   --   HGB 14.1 13.5 12.9* 12.0* 13.0 12.8*  HCT 42.2 39.8 38.7* 37.1* 37.4* 37.5*  MCV 92.1 92.6 93.0 93.0 89.3 89.9  PLT 200 186 166 174 187 216   Basic Metabolic Panel: Recent Labs  Lab 11/10/23 2109 11/11/23 0432 11/11/23 1910 11/12/23 0521 11/13/23 0300  11/14/23 0252  NA 140 137  --  133* 137 137  K 4.4 4.0  --  3.9 4.3 4.0  CL 106 107  --  108 108 111  CO2 22 21*  --  22 21* 22  GLUCOSE 126* 65*  --  138* 212* 132*  BUN 22 21  --  24* 32* 25*  CREATININE 1.19 1.17  --  1.24 1.15 0.97  CALCIUM 9.8 8.8*  --  8.5* 8.9 8.6*  MG  --  1.3* 2.0 2.1  --   --    GFR: Estimated Creatinine Clearance: 86.5 mL/min (by C-G formula based on SCr of 0.97 mg/dL). Liver Function Tests: Recent Labs  Lab 11/10/23 2109 11/11/23 0432  AST 27 23  ALT 24 20  ALKPHOS 45 30*  BILITOT 1.6* 1.8*  PROT 6.5 5.2*  ALBUMIN 3.5 2.8*   No results for input(s): "LIPASE", "  AMYLASE" in the last 168 hours. No results for input(s): "AMMONIA" in the last 168 hours. Coagulation Profile: Recent Labs  Lab 11/10/23 2109  INR 2.1*   Cardiac Enzymes: No results for input(s): "CKTOTAL", "CKMB", "CKMBINDEX", "TROPONINI" in the last 168 hours. BNP (last 3 results) No results for input(s): "PROBNP" in the last 8760 hours. HbA1C: No results for input(s): "HGBA1C" in the last 72 hours. CBG: Recent Labs  Lab 11/15/23 0637 11/15/23 1107 11/15/23 1600 11/15/23 2107 11/16/23 0624  GLUCAP 101* 139* 171* 168* 178*   Lipid Profile: No results for input(s): "CHOL", "HDL", "LDLCALC", "TRIG", "CHOLHDL", "LDLDIRECT" in the last 72 hours. Thyroid Function Tests: No results for input(s): "TSH", "T4TOTAL", "FREET4", "T3FREE", "THYROIDAB" in the last 72 hours. Anemia Panel: No results for input(s): "VITAMINB12", "FOLATE", "FERRITIN", "TIBC", "IRON", "RETICCTPCT" in the last 72 hours. Sepsis Labs: Recent Labs  Lab 11/10/23 2105 11/10/23 2141 11/10/23 2333 11/11/23 0432  LATICACIDVEN 1.9 2.2* 2.3* 1.3    Recent Results (from the past 240 hours)  Blood Culture (routine x 2)     Status: Abnormal   Collection Time: 11/10/23  9:09 PM   Specimen: BLOOD LEFT FOREARM  Result Value Ref Range Status   Specimen Description BLOOD LEFT FOREARM  Final   Special Requests    Final    BOTTLES DRAWN AEROBIC AND ANAEROBIC Blood Culture adequate volume   Culture  Setup Time   Final    GRAM POSITIVE COCCI IN CHAINS IN BOTH AEROBIC AND ANAEROBIC BOTTLES CRITICAL RESULT CALLED TO, READ BACK BY AND VERIFIED WITH: E. SINCLAIR PHARMD, AT 1025 11/11/23 D. VANHOOK Performed at [facility], [address] [place], [place] [address]    Culture STREPTOCOCCUS GROUP G (A)  Final   Report Status 11/13/2023 FINAL  Final   Organism ID, Bacteria STREPTOCOCCUS GROUP G  Final      Susceptibility   Streptococcus group g - MIC*    CLINDAMYCIN <=0.25 SENSITIVE Sensitive     AMPICILLIN <=0.25 SENSITIVE Sensitive     ERYTHROMYCIN <=0.12 SENSITIVE Sensitive     VANCOMYCIN 0.5 SENSITIVE Sensitive     CEFTRIAXONE <=0.12 SENSITIVE Sensitive     LEVOFLOXACIN 0.5 SENSITIVE Sensitive     PENICILLIN <=0.06 SENSITIVE Sensitive     * STREPTOCOCCUS GROUP G  Blood Culture ID Panel (Reflexed)     Status: Abnormal   Collection Time: 11/10/23  9:09 PM  Result Value Ref Range Status   Enterococcus faecalis NOT DETECTED NOT DETECTED Final   Enterococcus Faecium NOT DETECTED NOT DETECTED Final   Listeria monocytogenes NOT DETECTED NOT DETECTED Final   Staphylococcus species NOT DETECTED NOT DETECTED Final   Staphylococcus aureus (BCID) NOT DETECTED NOT DETECTED Final   Staphylococcus epidermidis NOT DETECTED NOT DETECTED Final   Staphylococcus lugdunensis NOT DETECTED NOT DETECTED Final   Streptococcus species DETECTED (A) NOT DETECTED Final    Comment: Not Enterococcus species, Streptococcus agalactiae, Streptococcus pyogenes, or Streptococcus pneumoniae. CRITICAL RESULT CALLED TO, READ BACK BY AND VERIFIED WITH: E. SINCLAIR PHARMD, AT 1025 11/11/23 D. VANHOOK    Streptococcus agalactiae NOT DETECTED NOT DETECTED Final   Streptococcus pneumoniae NOT DETECTED NOT DETECTED Final   Streptococcus pyogenes NOT DETECTED NOT DETECTED Final   A.calcoaceticus-baumannii NOT DETECTED NOT DETECTED  Final   Bacteroides fragilis NOT DETECTED NOT DETECTED Final   Enterobacterales NOT DETECTED NOT DETECTED Final   Enterobacter cloacae complex NOT DETECTED NOT DETECTED Final   Escherichia coli NOT DETECTED NOT DETECTED Final   Klebsiella  aerogenes NOT DETECTED NOT DETECTED Final   Klebsiella oxytoca NOT DETECTED NOT DETECTED Final   Klebsiella pneumoniae NOT DETECTED NOT DETECTED Final   Proteus species NOT DETECTED NOT DETECTED Final   Salmonella species NOT DETECTED NOT DETECTED Final   Serratia marcescens NOT DETECTED NOT DETECTED Final   Haemophilus influenzae NOT DETECTED NOT DETECTED Final   Neisseria meningitidis NOT DETECTED NOT DETECTED Final   Pseudomonas aeruginosa NOT DETECTED NOT DETECTED Final   Stenotrophomonas maltophilia NOT DETECTED NOT DETECTED Final   Candida albicans NOT DETECTED NOT DETECTED Final   Candida auris NOT DETECTED NOT DETECTED Final   Candida glabrata NOT DETECTED NOT DETECTED Final   Candida krusei NOT DETECTED NOT DETECTED Final   Candida parapsilosis NOT DETECTED NOT DETECTED Final   Candida tropicalis NOT DETECTED NOT DETECTED Final   Cryptococcus neoformans/gattii NOT DETECTED NOT DETECTED Final    Comment: Performed at [facility], [address], [place], [place] [address]  Blood Culture (routine x 2)     Status: Abnormal   Collection Time: 11/10/23  9:20 PM   Specimen: BLOOD RIGHT FOREARM  Result Value Ref Range Status   Specimen Description BLOOD RIGHT FOREARM  Final   Special Requests   Final    BOTTLES DRAWN AEROBIC AND ANAEROBIC Blood Culture adequate volume   Culture  Setup Time   Final    GRAM POSITIVE COCCI IN CHAINS IN BOTH AEROBIC AND ANAEROBIC BOTTLES CRITICAL RESULT CALLED TO, READ BACK BY AND VERIFIED WITH: E. SINCLAIR PHARMD, AT 1025 11/11/23 D. VANHOOK    Culture (A)  Final    STREPTOCOCCUS GROUP G SUSCEPTIBILITIES PERFORMED ON PREVIOUS CULTURE WITHIN THE LAST 5 DAYS. Performed at [facility], [address], [place], [place] [address]    Report Status 11/13/2023 FINAL  Final  Resp panel by RT-PCR (RSV, Flu A&B, Covid) Anterior Nasal Swab     Status: None   Collection Time: 11/10/23 11:21 PM   Specimen: Anterior Nasal Swab  Result Value Ref Range Status   SARS Coronavirus 2 by RT PCR NEGATIVE NEGATIVE Final   Influenza A by PCR NEGATIVE NEGATIVE Final   Influenza B by PCR NEGATIVE NEGATIVE Final    Comment: (NOTE) The Xpert Xpress SARS-CoV-2/FLU/RSV plus assay is intended as an aid in the diagnosis of influenza from Nasopharyngeal swab specimens and should not be used as a sole basis for treatment. Nasal washings and aspirates are unacceptable for Xpert Xpress SARS-CoV-2/FLU/RSV testing.  Fact Sheet for Patients: BloggerCourse.com  Fact Sheet for Healthcare Providers: SeriousBroker.it  This test is not yet approved or cleared by the [place] FDA and has been authorized for detection and/or diagnosis of SARS-CoV-2 by FDA under an Emergency Use Authorization (EUA). This EUA will remain in effect (meaning this test can be used) for the duration of the COVID-19 declaration under Section 564(b)(1) of the Act, 21 U.S.C. section 360bbb-3(b)(1), unless the authorization is terminated or revoked.     Resp Syncytial Virus by PCR NEGATIVE NEGATIVE Final    Comment: (NOTE) Fact Sheet for Patients: BloggerCourse.com  Fact Sheet for Healthcare Providers: SeriousBroker.it  This test is not yet approved or cleared by the [place] FDA and has been authorized for detection and/or diagnosis of SARS-CoV-2 by FDA under an Emergency Use Authorization (EUA). This EUA will remain in effect (meaning this test can be used) for the duration of the COVID-19 declaration under Section 564(b)(1) of the Act, 21 U.S.C. section 360bbb-3(b)(1), unless the  authorization is terminated  or revoked.  Performed at [facility], [address] [place], [place] [address]   Culture, blood (Routine X 2) w Reflex to ID Panel     Status: None (Preliminary result)   Collection Time: 11/12/23 11:14 AM   Specimen: BLOOD LEFT ARM  Result Value Ref Range Status   Specimen Description BLOOD LEFT ARM  Final   Special Requests   Final    BOTTLES DRAWN AEROBIC AND ANAEROBIC Blood Culture results may not be optimal due to an inadequate volume of blood received in culture bottles   Culture   Final    NO GROWTH 3 DAYS Performed at [facility], [address] [place], [place] [address]    Report Status PENDING  Incomplete  Culture, blood (Routine X 2) w Reflex to ID Panel     Status: None (Preliminary result)   Collection Time: 11/12/23 11:25 AM   Specimen: BLOOD LEFT HAND  Result Value Ref Range Status   Specimen Description BLOOD LEFT HAND  Final   Special Requests   Final    BOTTLES DRAWN AEROBIC AND ANAEROBIC Blood Culture results may not be optimal due to an inadequate volume of blood received in culture bottles   Culture   Final    NO GROWTH 3 DAYS Performed at [facility], [address], [place], [place] [address]    Report Status PENDING  Incomplete         Radiology Studies: No results found.       Scheduled Meds:  atorvastatin  20 mg Oral Daily   cholecalciferol  1,000 Units Oral Daily   dapagliflozin propanediol  10 mg Oral Daily   ezetimibe  10 mg Oral Daily   folic acid  1 mg Oral Daily   gabapentin  100 mg Oral TID   insulin aspart  0-5 Units Subcutaneous QHS   insulin aspart  0-6 Units Subcutaneous TID WC   insulin glargine  40 Units Subcutaneous QHS   metoprolol succinate  50 mg Oral Daily   rivaroxaban  20 mg Oral QHS   sacubitril-valsartan  1 tablet Oral BID   sodium chloride flush  3 mL Intravenous Q12H   sodium chloride flush  3-10 mL Intravenous Q12H   Continuous Infusions:   ceFAZolin (ANCEF) IV 2 g (11/16/23 3664)           Glade Lloyd, MD [facility] 11/16/2023, 7:24 AM

## 2023-11-16 NOTE — Plan of Care (Signed)
 Problem: Education: Goal: Ability to describe self-care measures that may prevent or decrease complications (Diabetes Survival Skills Education) will improve 11/16/2023 1702 by Martie Round, RN Outcome: Adequate for Discharge 11/16/2023 1702 by Martie Round, RN Outcome: Progressing Goal: Individualized Educational Video(s) 11/16/2023 1702 by Martie Round, RN Outcome: Adequate for Discharge 11/16/2023 1702 by Martie Round, RN Outcome: Progressing   Problem: Coping: Goal: Ability to adjust to condition or change in health will improve 11/16/2023 1702 by Martie Round, RN Outcome: Adequate for Discharge 11/16/2023 1702 by Martie Round, RN Outcome: Progressing 11/16/2023 1321 by Martie Round, RN Outcome: Progressing   Problem: Fluid Volume: Goal: Ability to maintain a balanced intake and output will improve 11/16/2023 1702 by Martie Round, RN Outcome: Adequate for Discharge 11/16/2023 1702 by Martie Round, RN Outcome: Progressing   Problem: Health Behavior/Discharge Planning: Goal: Ability to identify and utilize available resources and services will improve 11/16/2023 1702 by Martie Round, RN Outcome: Adequate for Discharge 11/16/2023 1702 by Martie Round, RN Outcome: Progressing Goal: Ability to manage health-related needs will improve 11/16/2023 1702 by Martie Round, RN Outcome: Adequate for Discharge 11/16/2023 1702 by Martie Round, RN Outcome: Progressing   Problem: Metabolic: Goal: Ability to maintain appropriate glucose levels will improve 11/16/2023 1702 by Martie Round, RN Outcome: Adequate for Discharge 11/16/2023 1702 by Martie Round, RN Outcome: Progressing   Problem: Nutritional: Goal: Maintenance of adequate nutrition will improve 11/16/2023 1702 by Martie Round, RN Outcome: Adequate for Discharge 11/16/2023 1702 by Martie Round, RN Outcome: Progressing Goal: Progress toward achieving an optimal weight  will improve 11/16/2023 1702 by Martie Round, RN Outcome: Adequate for Discharge 11/16/2023 1702 by Martie Round, RN Outcome: Progressing   Problem: Skin Integrity: Goal: Risk for impaired skin integrity will decrease 11/16/2023 1702 by Martie Round, RN Outcome: Adequate for Discharge 11/16/2023 1702 by Martie Round, RN Outcome: Progressing   Problem: Tissue Perfusion: Goal: Adequacy of tissue perfusion will improve 11/16/2023 1702 by Martie Round, RN Outcome: Adequate for Discharge 11/16/2023 1702 by Martie Round, RN Outcome: Progressing   Problem: Fluid Volume: Goal: Hemodynamic stability will improve 11/16/2023 1702 by Martie Round, RN Outcome: Adequate for Discharge 11/16/2023 1702 by Martie Round, RN Outcome: Progressing   Problem: Clinical Measurements: Goal: Diagnostic test results will improve 11/16/2023 1702 by Martie Round, RN Outcome: Adequate for Discharge 11/16/2023 1702 by Martie Round, RN Outcome: Progressing Goal: Signs and symptoms of infection will decrease 11/16/2023 1702 by Martie Round, RN Outcome: Adequate for Discharge 11/16/2023 1702 by Martie Round, RN Outcome: Progressing   Problem: Respiratory: Goal: Ability to maintain adequate ventilation will improve 11/16/2023 1702 by Martie Round, RN Outcome: Adequate for Discharge 11/16/2023 1702 by Martie Round, RN Outcome: Progressing   Problem: Education: Goal: Knowledge of General Education information will improve Description: Including pain rating scale, medication(s)/side effects and non-pharmacologic comfort measures 11/16/2023 1702 by Martie Round, RN Outcome: Adequate for Discharge 11/16/2023 1702 by Martie Round, RN Outcome: Progressing   Problem: Health Behavior/Discharge Planning: Goal: Ability to manage health-related needs will improve 11/16/2023 1702 by Martie Round, RN Outcome: Adequate for Discharge 11/16/2023 1702 by Martie Round, RN Outcome: Progressing   Problem: Clinical Measurements: Goal: Ability to maintain clinical measurements within normal limits will improve 11/16/2023 1702 by Martie Round, RN Outcome: Adequate for Discharge 11/16/2023 1702 by Derrell Lolling,  Amparo Bristol, RN Outcome: Progressing Goal: Will remain free from infection 11/16/2023 1702 by Martie Round, RN Outcome: Adequate for Discharge 11/16/2023 1702 by Martie Round, RN Outcome: Progressing Goal: Diagnostic test results will improve 11/16/2023 1702 by Martie Round, RN Outcome: Adequate for Discharge 11/16/2023 1702 by Martie Round, RN Outcome: Progressing Goal: Respiratory complications will improve 11/16/2023 1702 by Martie Round, RN Outcome: Adequate for Discharge 11/16/2023 1702 by Martie Round, RN Outcome: Progressing Goal: Cardiovascular complication will be avoided 11/16/2023 1702 by Martie Round, RN Outcome: Adequate for Discharge 11/16/2023 1702 by Martie Round, RN Outcome: Progressing   Problem: Activity: Goal: Risk for activity intolerance will decrease 11/16/2023 1702 by Martie Round, RN Outcome: Adequate for Discharge 11/16/2023 1702 by Martie Round, RN Outcome: Progressing   Problem: Nutrition: Goal: Adequate nutrition will be maintained 11/16/2023 1702 by Martie Round, RN Outcome: Adequate for Discharge 11/16/2023 1702 by Martie Round, RN Outcome: Progressing   Problem: Coping: Goal: Level of anxiety will decrease 11/16/2023 1702 by Martie Round, RN Outcome: Adequate for Discharge 11/16/2023 1702 by Martie Round, RN Outcome: Progressing   Problem: Elimination: Goal: Will not experience complications related to bowel motility 11/16/2023 1702 by Martie Round, RN Outcome: Adequate for Discharge 11/16/2023 1702 by Martie Round, RN Outcome: Progressing Goal: Will not experience complications related to urinary retention 11/16/2023 1702 by Martie Round, RN Outcome: Adequate for Discharge 11/16/2023 1702 by Martie Round, RN Outcome: Progressing   Problem: Pain Managment: Goal: General experience of comfort will improve and/or be controlled 11/16/2023 1702 by Martie Round, RN Outcome: Adequate for Discharge 11/16/2023 1702 by Martie Round, RN Outcome: Progressing   Problem: Safety: Goal: Ability to remain free from injury will improve 11/16/2023 1702 by Martie Round, RN Outcome: Adequate for Discharge 11/16/2023 1702 by Martie Round, RN Outcome: Progressing   Problem: Skin Integrity: Goal: Risk for impaired skin integrity will decrease 11/16/2023 1702 by Martie Round, RN Outcome: Adequate for Discharge 11/16/2023 1702 by Martie Round, RN Outcome: Progressing   Problem: Education: Goal: Knowledge of General Education information will improve Description: Including pain rating scale, medication(s)/side effects and non-pharmacologic comfort measures 11/16/2023 1702 by Martie Round, RN Outcome: Adequate for Discharge 11/16/2023 1702 by Martie Round, RN Outcome: Progressing   Problem: Health Behavior/Discharge Planning: Goal: Ability to manage health-related needs will improve 11/16/2023 1702 by Martie Round, RN Outcome: Adequate for Discharge 11/16/2023 1702 by Martie Round, RN Outcome: Progressing   Problem: Clinical Measurements: Goal: Ability to maintain clinical measurements within normal limits will improve 11/16/2023 1702 by Martie Round, RN Outcome: Adequate for Discharge 11/16/2023 1702 by Martie Round, RN Outcome: Progressing Goal: Will remain free from infection 11/16/2023 1702 by Martie Round, RN Outcome: Adequate for Discharge 11/16/2023 1702 by Martie Round, RN Outcome: Progressing Goal: Diagnostic test results will improve 11/16/2023 1702 by Martie Round, RN Outcome: Adequate for Discharge 11/16/2023 1702 by Martie Round, RN Outcome:  Progressing Goal: Respiratory complications will improve 11/16/2023 1702 by Martie Round, RN Outcome: Adequate for Discharge 11/16/2023 1702 by Martie Round, RN Outcome: Progressing Goal: Cardiovascular complication will be avoided 11/16/2023 1702 by Martie Round, RN Outcome: Adequate for Discharge 11/16/2023 1702 by Martie Round, RN Outcome: Progressing   Problem: Activity: Goal: Risk for activity intolerance will decrease 11/16/2023 1702 by Martie Round, RN Outcome: Adequate  for Discharge 11/16/2023 1702 by Martie Round, RN Outcome: Progressing   Problem: Nutrition: Goal: Adequate nutrition will be maintained 11/16/2023 1702 by Martie Round, RN Outcome: Adequate for Discharge 11/16/2023 1702 by Martie Round, RN Outcome: Progressing   Problem: Coping: Goal: Level of anxiety will decrease 11/16/2023 1702 by Martie Round, RN Outcome: Adequate for Discharge 11/16/2023 1702 by Martie Round, RN Outcome: Progressing   Problem: Elimination: Goal: Will not experience complications related to bowel motility 11/16/2023 1702 by Martie Round, RN Outcome: Adequate for Discharge 11/16/2023 1702 by Martie Round, RN Outcome: Progressing Goal: Will not experience complications related to urinary retention 11/16/2023 1702 by Martie Round, RN Outcome: Adequate for Discharge 11/16/2023 1702 by Martie Round, RN Outcome: Progressing   Problem: Pain Managment: Goal: General experience of comfort will improve and/or be controlled 11/16/2023 1702 by Martie Round, RN Outcome: Adequate for Discharge 11/16/2023 1702 by Martie Round, RN Outcome: Progressing   Problem: Safety: Goal: Ability to remain free from injury will improve 11/16/2023 1702 by Martie Round, RN Outcome: Adequate for Discharge 11/16/2023 1702 by Martie Round, RN Outcome: Progressing   Problem: Skin Integrity: Goal: Risk for impaired skin integrity will  decrease 11/16/2023 1702 by Martie Round, RN Outcome: Adequate for Discharge 11/16/2023 1702 by Martie Round, RN Outcome: Progressing

## 2023-11-16 NOTE — Discharge Summary (Signed)
 Physician Discharge Summary  Anthony Case KGM:[id number] DOB: 1951-04-05 DOA: 11/10/2023  PCP: Garlan Fillers, MD  Admit date: 11/10/2023 Discharge date: 11/16/2023  Admitted From: Home Disposition: Home  Recommendations for Outpatient Follow-up:  Follow up with PCP in 1 week with repeat CBC/BMP Outpatient follow-up with cardiology, podiatry and rheumatology Follow up in ED if symptoms worsen or new appear   Home Health: No Equipment/Devices: None  Discharge Condition: Stable CODE STATUS: Full Diet recommendation: Heart healthy/carb modified  Brief/Interim Summary: 73 y.o. male with medical history significant for hypertension, insulin-dependent diabetes mellitus, OSA, history of PE on Xarelto, HFmrEF, psoriatic arthritis, and admission last month for diabetic foot ulcer, presented with shaking chills and severe right groin pain. On presentation, he was febrile, tachycardic and hypotensive.  Chest x-ray and right foot x-ray were negative.  CT of abdomen and pelvis notable for mild right inguinal adenopathy.  WBC 13,500, negative COVID/RSV/influenza PCR, lactic acid 2.3.  He was started on IV fluids and antibiotics.  Subsequently, he had group G Streptococcus bacteremia.  ID was consulted.  MRI of right foot showed cellulitis with no osteomyelitis or abscess.  TTE showed calcified aortic valve with aortic stenosis.  TEE was negative for any vegetation.  ID has switched him to oral amoxicillin and cleared him for discharge.  He will be discharged home today.  Discharge Diagnoses:   Severe sepsis: Present on admission: Resolved Right foot cellulitis Group G Streptococcus bacteremia Diabetic right toe ulcer -ID following.  Currently on IV Ancef. -MRI of right foot showed cellulitis with no osteomyelitis or abscess.  TTE showed calcified aortic valve with aortic stenosis.   -Currently afebrile and hemodynamically stable.  Repeat blood cultures have been negative so far. -TEE was  negative for any vegetation.  ID has switched him to oral amoxicillin till 11/26/2023 and cleared him for discharge.  He will be discharged home today. -Outpatient follow-up with podiatry   Chronic systolic heart failure Hypertension-hyperlipidemia -Compensated.  Continue Entresto, Farxiga and Toprol XL.  Continue statin and ezetimibe -Continue diet and fluid restriction.  Outpatient follow-up with cardiology   Leukocytosis -Resolved   Diabetes mellitus type 2 with hyperglycemia -Continue long-acting insulin.  Carb modified diet.  Outpatient follow-up with PCP   History of DVT/PE -Continue Xarelto   Anemia of chronic disease -From chronic illnesses.  Monitor intermittently as an outpatient   Psoriatic arthritis -Methotrexate and Cosentyx to remain on hold due to active infection.  Outpatient follow-up with rheumatology for resumption of the same.   Cholelithiasis -Needs elective outpatient follow-up with general surgery if symptomatic   Obesity class I -Outpatient follow-up  Discharge Instructions  Discharge Instructions     Diet - low sodium heart healthy   Complete by: As directed    Diet Carb Modified   Complete by: As directed    Increase activity slowly   Complete by: As directed    No wound care   Complete by: As directed       Allergies as of 11/16/2023       Reactions   Ace Inhibitors Other (See Comments)   Lisinopril Cough        Medication List     STOP taking these medications    COSENTYX IV   HYDROcodone-acetaminophen 5-325 MG tablet Commonly known as: NORCO/VICODIN   methotrexate 2.5 MG tablet Commonly known as: RHEUMATREX       TAKE these medications    acetaminophen 500 MG tablet Commonly known as: TYLENOL Take 1,000 mg  by mouth every 6 (six) hours as needed for moderate pain.   amoxicillin 500 MG capsule Commonly known as: AMOXIL Take 2 capsules (1,000 mg total) by mouth every 8 (eight) hours for 10 days.   atorvastatin 20 MG  tablet Commonly known as: LIPITOR Take 20 mg by mouth daily.   augmented betamethasone dipropionate 0.05 % cream Commonly known as: DIPROLENE-AF Apply 1 Application topically as needed. What changed:  when to take this reasons to take this   cholecalciferol 25 MCG (1000 UNIT) tablet Commonly known as: VITAMIN D3 Take 1,000 Units by mouth daily.   dapagliflozin propanediol 10 MG Tabs tablet Commonly known as: FARXIGA Take 10 mg by mouth daily.   Entresto 97-103 MG Generic drug: sacubitril-valsartan Take 1 tablet by mouth 2 (two) times daily.   ezetimibe 10 MG tablet Commonly known as: ZETIA Take 1 tablet (10 mg total) by mouth daily.   Fish Oil 1000 MG Caps Take 1,000 mg by mouth daily.   folic acid 1 MG tablet Commonly known as: FOLVITE Take 1 mg by mouth daily.   gabapentin 100 MG capsule Commonly known as: NEURONTIN Take 100 mg by mouth 3 (three) times daily.   glipiZIDE 2.5 MG 24 hr tablet Commonly known as: GLUCOTROL XL Take 5 mg by mouth daily with breakfast.   metFORMIN 500 MG tablet Commonly known as: GLUCOPHAGE Take 500 mg by mouth 2 (two) times daily with a meal.   metoprolol succinate 50 MG 24 hr tablet Commonly known as: TOPROL-XL Take 1 tablet (50 mg total) by mouth daily.   multivitamin with minerals Tabs tablet Take 1 tablet by mouth daily.   Ozempic (0.25 or 0.5 MG/DOSE) 2 MG/3ML Sopn Generic drug: Semaglutide(0.25 or 0.5MG /DOS) SMARTSIG:0.25 Milligram(s) SUB-Q Once a Week   rivaroxaban 20 MG Tabs tablet Commonly known as: XARELTO Take 1 tablet (20 mg total) by mouth daily. 08/04/13 last dose of xarelto in preparation for colonoscopy   TRESIBA FLEXTOUCH Campbell Inject 48 Units into the skin daily.   vitamin C 1000 MG tablet Take 1,000 mg by mouth daily.        Follow-up Information     Garlan Fillers, MD. Schedule an appointment as soon as possible for a visit in 1 week(s).   Specialty: Internal Medicine Contact  information: [address] [place] [address] [phone]         Edwin Cap, DPM. Schedule an appointment as soon as possible for a visit in 1 week(s).   Specialty: Podiatry Contact information: [address] [place] [address] [phone]         Rheumatologist. Schedule an appointment as soon as possible for a visit in 1 week(s).                 Allergies  Allergen Reactions   Ace Inhibitors Other (See Comments)   Lisinopril Cough    Consultations: ID   Procedures/Studies: ECHO TEE Result Date: 11/16/2023    TRANSESOPHOGEAL ECHO REPORT   Patient Name:   DAMOND BORCHERS Date of Exam: 11/16/2023 Medical Rec #:  [id number]          Height:       71.0 in Accession #:    [id number]         Weight:       248.2 lb Date of Birth:  1950-09-15          BSA:          2.311 m Patient Age:  72 years           BP:           111/74 mmHg Patient Gender: M                  HR:           65 bpm. Exam Location:  Inpatient Procedure: Transesophageal Echo, Cardiac Doppler and Color Doppler (Both            Spectral and Color Flow Doppler were utilized during procedure). Indications:     I50.40* Unspecified combined systolic (congestive) and                  diastolic (congestive) heart failure. Bacteremia.; I35.0                  Nonrheumatic aortic (valve) stenosis  History:         Patient has prior history of Echocardiogram examinations, most                  recent 11/13/2023. Cardiomyopathy, Abnormal ECG, Aortic Valve                  Disease, Arrythmias:LBBB, Signs/Symptoms:Bacteremia and Edema;                  Risk Factors:Dyslipidemia, Diabetes and Sleep Apnea. Pulmonary                  embolus. Aortic stenosis.  Sonographer:     Sheralyn Boatman RDCS Referring Phys:  [id number] HAO MENG Diagnosing Phys: Thurmon Fair MD PROCEDURE: After discussion of the risks and benefits of a TEE, an informed consent was obtained from the patient. The transesophogeal probe was passed  without difficulty through the esophogus of the patient. Imaged were obtained with the patient in a supine position. Sedation performed by different physician. The patient was monitored while under deep sedation. Anesthestetic sedation was provided intravenously by Anesthesiology: 150mg  of Propofol, 40mg  of Lidocaine. The patient's vital signs; including heart rate, blood pressure, and oxygen saturation; remained stable throughout the procedure. The patient developed no complications during the procedure.  IMPRESSIONS  1. Left ventricular ejection fraction, by estimation, is 40 to 45%. The left ventricle has mildly decreased function. The left ventricle demonstrates global hypokinesis. Left ventricular diastolic parameters are consistent with Grade I diastolic dysfunction (impaired relaxation).  2. Right ventricular systolic function is low normal. The right ventricular size is normal.  3. Left atrial size was mildly dilated. No left atrial/left atrial appendage thrombus was detected. The LAA emptying velocity was 80 cm/s.  4. The mitral valve is degenerative. Mild to moderate mitral valve regurgitation. No evidence of mitral stenosis.  5. The aortic valve is tricuspid. There is moderate calcification of the aortic valve. There is moderate thickening of the aortic valve. Aortic valve regurgitation is not visualized. Mild to moderate aortic valve stenosis.  6. There is mild dilatation of the ascending aorta, measuring 39 mm. There is Moderate (Grade III) protruding plaque involving the descending aorta. Conclusion(s)/Recommendation(s): No evidence of vegetation/infective endocarditis on this transesophageael echocardiogram. FINDINGS  Left Ventricle: Left ventricular ejection fraction, by estimation, is 40 to 45%. The left ventricle has mildly decreased function. The left ventricle demonstrates global hypokinesis. The left ventricular internal cavity size was normal in size. There is  no left ventricular hypertrophy.  Abnormal (paradoxical) septal motion, consistent with left bundle branch block. Left ventricular diastolic parameters are consistent with Grade I diastolic dysfunction (impaired  relaxation). Right Ventricle: The right ventricular size is normal. No increase in right ventricular wall thickness. Right ventricular systolic function is low normal. Left Atrium: Left atrial size was mildly dilated. No left atrial/left atrial appendage thrombus was detected. The LAA emptying velocity was 80 cm/s. Right Atrium: Right atrial size was normal in size. Pericardium: There is no evidence of pericardial effusion. Mitral Valve: The mitral valve is degenerative in appearance. Mild to moderate mitral valve regurgitation, with centrally-directed jet. No evidence of mitral valve stenosis. Tricuspid Valve: The tricuspid valve is normal in structure. Tricuspid valve regurgitation is trivial. No evidence of tricuspid stenosis. Aortic Valve: The aortic valve is tricuspid. There is moderate calcification of the aortic valve. There is moderate thickening of the aortic valve. Aortic valve regurgitation is not visualized. Mild to moderate aortic stenosis is present. Aortic valve mean gradient measures 13.4 mmHg. Aortic valve peak gradient measures 21.9 mmHg. Aortic valve area, by VTI measures 1.35 cm. Pulmonic Valve: The pulmonic valve was normal in structure. Pulmonic valve regurgitation is not visualized. No evidence of pulmonic stenosis. Aorta: The aortic root is normal in size and structure. There is mild dilatation of the ascending aorta, measuring 39 mm. There is moderate (Grade III) protruding plaque involving the descending aorta. IAS/Shunts: No atrial level shunt detected by color flow Doppler. Additional Comments: Spectral Doppler performed. LEFT VENTRICLE PLAX 2D LVOT diam:     2.30 cm LV SV:         74 LV SV Index:   32 LVOT Area:     4.15 cm  AORTIC VALVE AV Area (Vmax):    1.97 cm AV Area (Vmean):   1.66 cm AV Area (VTI):      1.35 cm AV Vmax:           233.96 cm/s AV Vmean:          176.889 cm/s AV VTI:            0.551 m AV Peak Grad:      21.9 mmHg AV Mean Grad:      13.4 mmHg LVOT Vmax:         111.17 cm/s LVOT Vmean:        70.615 cm/s LVOT VTI:          0.179 m LVOT/AV VTI ratio: 0.32  SHUNTS Systemic VTI:  0.18 m Systemic Diam: 2.30 cm Thurmon Fair MD Electronically signed by Thurmon Fair MD Signature Date/Time: 11/16/2023/1:18:58 PM    Final    EP STUDY Result Date: 11/16/2023 See surgical note for result.  ECHOCARDIOGRAM COMPLETE Result Date: 11/13/2023    ECHOCARDIOGRAM REPORT   Patient Name:   ESTES LEHNER Date of Exam: 11/13/2023 Medical Rec #:  [id number]          Height:       71.0 in Accession #:    [id number]         Weight:       257.6 lb Date of Birth:  August 08, 1951          BSA:          2.348 m Patient Age:    72 years           BP:           154/71 mmHg Patient Gender: M                  HR:           62 bpm. Exam Location:  Inpatient Procedure: 2D Echo, Color Doppler, Cardiac Doppler and Intracardiac            Opacification Agent (Both Spectral and Color Flow Doppler were            utilized during procedure). Indications:    Endocarditis I38  History:        Patient has prior history of Echocardiogram examinations, most                 recent 08/11/2023.  Sonographer:    Harriette Bouillon RDCS Referring Phys: [facility]  Sonographer Comments: Technically difficult study due to poor echo windows. IMPRESSIONS  1. Technically difficult study with reduced echo windows  2. Left ventricular ejection fraction, by estimation, is 40 to 45%. Left ventricular ejection fraction by 2D MOD biplane is 41.2 %. The left ventricle has mildly decreased function. The left ventricle demonstrates global hypokinesis. There is mild left ventricular hypertrophy. Left ventricular diastolic parameters are consistent with Grade I diastolic dysfunction (impaired relaxation).  3. Right ventricular systolic function is normal. The  right ventricular size is normal. Tricuspid regurgitation signal is inadequate for assessing PA pressure.  4. The mitral valve is grossly normal. No evidence of mitral valve regurgitation.  5. The aortic valve is calcified. There is moderate calcification of the aortic valve. Aortic valve regurgitation is not visualized. Moderate aortic valve stenosis. Aortic valve area, by VTI measures 1.40 cm. Aortic valve mean gradient measures 10.0 mmHg. Aortic valve Vmax measures 1.88 m/s.  6. Aortic dilatation noted. There is borderline dilatation of the ascending aorta, measuring 39 mm. Comparison(s): No significant change from prior study. 08/11/2023: LVEF 40-45%. Conclusion(s)/Recommendation(s): No evidence of valvular vegetations on this transthoracic echocardiogram. Consider a transesophageal echocardiogram to exclude infective endocarditis if clinically indicated. FINDINGS  Left Ventricle: Left ventricular ejection fraction, by estimation, is 40 to 45%. Left ventricular ejection fraction by 2D MOD biplane is 41.2 %. The left ventricle has mildly decreased function. The left ventricle demonstrates global hypokinesis. Definity contrast agent was given IV to delineate the left ventricular endocardial borders. The left ventricular internal cavity size was normal in size. There is mild left ventricular hypertrophy. Abnormal (paradoxical) septal motion, consistent with left bundle branch block. Left ventricular diastolic parameters are consistent with Grade I diastolic dysfunction (impaired relaxation). Indeterminate filling pressures. Right Ventricle: The right ventricular size is normal. No increase in right ventricular wall thickness. Right ventricular systolic function is normal. Tricuspid regurgitation signal is inadequate for assessing PA pressure. Left Atrium: Left atrial size was normal in size. Right Atrium: Right atrial size was normal in size. Pericardium: There is no evidence of pericardial effusion. Mitral Valve:  The mitral valve is grossly normal. No evidence of mitral valve regurgitation. Tricuspid Valve: The tricuspid valve is not well visualized. Tricuspid valve regurgitation is trivial. Aortic Valve: The aortic valve is calcified. There is moderate calcification of the aortic valve. Aortic valve regurgitation is not visualized. Moderate aortic stenosis is present. Aortic valve mean gradient measures 10.0 mmHg. Aortic valve peak gradient  measures 14.1 mmHg. Aortic valve area, by VTI measures 1.40 cm. Pulmonic Valve: The pulmonic valve was not well visualized. Pulmonic valve regurgitation is not visualized. Aorta: Aortic dilatation noted. There is borderline dilatation of the ascending aorta, measuring 39 mm. Venous: The inferior vena cava was not well visualized. IAS/Shunts: No atrial level shunt detected by color flow Doppler.  LEFT VENTRICLE PLAX 2D  Biplane EF (MOD) LVIDd:         5.70 cm         LV Biplane EF:   Left LVIDs:         3.40 cm                          ventricular LV PW:         1.20 cm                          ejection LV IVS:        1.20 cm                          fraction by LVOT diam:     2.10 cm                          2D MOD LV SV:         82                               biplane is LV SV Index:   35                               41.2 %. LVOT Area:     3.46 cm                                Diastology                                LV e' medial:    6.31 cm/s LV Volumes (MOD)               LV E/e' medial:  13.7 LV vol d, MOD    134.6 ml      LV e' lateral:   8.92 cm/s A2C:                           LV E/e' lateral: 9.7 LV vol d, MOD    178.7 ml A4C: LV vol s, MOD    83.3 ml A2C: LV vol s, MOD    102.4 ml A4C: LV SV MOD A2C:   51.3 ml LV SV MOD A4C:   178.7 ml LV SV MOD BP:    64.9 ml RIGHT VENTRICLE RV S prime:     14.80 cm/s TAPSE (M-mode): 2.5 cm LEFT ATRIUM           Index LA diam:      4.80 cm 2.04 cm/m LA Vol (A4C): 77.5 ml 33.01 ml/m  AORTIC VALVE AV Area (Vmax):     1.59 cm AV Area (Vmean):   1.40 cm AV Area (VTI):     1.40 cm AV Vmax:           188.00 cm/s AV Vmean:          155.000 cm/s AV VTI:            0.582 m AV Peak Grad:      14.1 mmHg AV Mean Grad:      10.0  mmHg LVOT Vmax:         86.10 cm/s LVOT Vmean:        62.800 cm/s LVOT VTI:          0.236 m LVOT/AV VTI ratio: 0.41  AORTA Ao Root diam: 3.70 cm Ao Asc diam:  3.90 cm MITRAL VALVE MV Area (PHT): 2.19 cm     SHUNTS MV Decel Time: 346 msec     Systemic VTI:  0.24 m MV E velocity: 86.50 cm/s   Systemic Diam: 2.10 cm MV A velocity: 102.00 cm/s MV E/A ratio:  0.85 Zoila Shutter MD Electronically signed by Zoila Shutter MD Signature Date/Time: 11/13/2023/12:39:02 PM    Final    MR TOES RIGHT W WO CONTRAST Result Date: 11/12/2023 CLINICAL DATA:  Chronic foot wound. Bacteremia with cellulitis. Evaluate for osteomyelitis. EXAM: MRI OF THE RIGHT TOES WITHOUT AND WITH CONTRAST TECHNIQUE: Multiplanar, multisequence MR imaging of the right toes was performed both before and after administration of intravenous contrast. CONTRAST:  10mL GADAVIST GADOBUTROL 1 MMOL/ML IV SOLN COMPARISON:  Right foot radiographs 11/11/2023 and 09/27/2023. FINDINGS: Technical note: Despite efforts by the technologist and patient, mild to moderate motion artifact is present on today's exam and could not be eliminated. This reduces exam sensitivity and specificity. Bones/Joint/Cartilage Mild clawtoe deformities of the 2nd through 5th toes. No evidence of acute fracture, dislocation or osteomyelitis. 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 No significant joint effusions or abnormal synovial enhancement. Ligaments The collateral ligaments of the metatarsophalangeal joints appear intact. Muscles and Tendons Forefoot muscular atrophy without focal fluid collection or abnormal enhancement. The forefoot tendons appear intact, without significant tenosynovitis. Soft tissues Mild soft tissue  swelling and enhancement within the great toe. No focal fluid collection, foreign body or soft tissue emphysema demonstrated. IMPRESSION: 1. Mild soft tissue swelling and enhancement within the great toe consistent with cellulitis. No evidence of abscess or osteomyelitis. 2. 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 Electronically Signed   By: Carey Bullocks M.D.   On: 11/12/2023 16:37   DG Ankle Complete Right Result Date: 11/12/2023 CLINICAL DATA:  Acute right ankle pain. EXAM: RIGHT ANKLE - COMPLETE 3+ VIEW COMPARISON:  Foot radiograph earlier today. FINDINGS: No fracture or dislocation. Tibial talar degenerative change with spurring. Large fragmented plantar calcaneal spur. Small Achilles tendon enthesophyte. No ankle joint effusion. Prominent vascular calcifications with mild soft tissue edema. No radiopaque foreign body or soft tissue gas. IMPRESSION: 1. No acute osseous findings of the right ankle. 2. Tibial talar degenerative change. 3. Large fragmented plantar calcaneal spur. Electronically Signed   By: Narda Rutherford M.D.   On: 11/12/2023 15:22   DG Foot Complete Right Result Date: 11/11/2023 CLINICAL DATA:  Right great toe wound. EXAM: RIGHT FOOT COMPLETE - 3+ VIEW COMPARISON:  None Available. FINDINGS: There is no evidence of an acute fracture or dislocation. A chronic appearing deformity is seen involving the large plantar calcaneal spur. Degenerative changes are seen involving the dorsal aspect of the mid right foot and multiple interphalangeal joints. A small soft tissue ulceration is seen along the medial aspect of the right great toe. IMPRESSION: 1. Small soft tissue ulceration along the medial aspect of the right great toe. 2. No acute osseous abnormality. 3. Degenerative changes, as described above. Electronically Signed   By: Aram Candela M.D.   On: 11/11/2023 01:44   CT ABDOMEN PELVIS W CONTRAST Result Date:  11/11/2023 CLINICAL DATA:  Chills  with right foot pain that radiates up to the groin. EXAM: CT ABDOMEN AND PELVIS WITH CONTRAST TECHNIQUE: Multidetector CT imaging of the abdomen and pelvis was performed using the standard protocol following bolus administration of intravenous contrast. RADIATION DOSE REDUCTION: This exam was performed according to the departmental dose-optimization program which includes automated exposure control, adjustment of the mA and/or kV according to patient size and/or use of iterative reconstruction technique. CONTRAST:  OMNIPAQUE IOHEXOL 350 MG/ML SOLN COMPARISON:  May 23, 2021 FINDINGS: Lower chest: No acute abnormality. Hepatobiliary: No focal liver abnormality is seen. A subcentimeter low-attenuation gallstone is seen within the dependent portion of the gallbladder lumen (axial CT image 25, CT series 3), without evidence of gallbladder wall thickening or biliary dilatation. Pancreas: Unremarkable. No pancreatic ductal dilatation or surrounding inflammatory changes. Spleen: Normal in size without focal abnormality. Adrenals/Urinary Tract: Adrenal glands are unremarkable. Kidneys are normal in size. Small parapelvic renal cysts are seen on the right. 2 mm and 3 mm nonobstructing renal calculi are noted within the right kidney. There is no evidence of obstructing renal calculi or hydronephrosis. Bladder is unremarkable. Stomach/Bowel: Stomach is within normal limits. Appendix appears normal. No evidence of bowel wall thickening, distention, or inflammatory changes. Vascular/Lymphatic: Aortic atherosclerosis. There is mild right inguinal lymphadenopathy. Reproductive: The prostate gland is moderately enlarged. Other: No abdominal wall hernia or abnormality. No abdominopelvic ascites. Musculoskeletal: Chronic, degenerative and postoperative changes are seen throughout the thoracolumbar spine. IMPRESSION: 1. Cholelithiasis. 2. Subcentimeter nonobstructing right renal calculi. 3.  Mild right inguinal lymphadenopathy. 4. Moderate enlargement of the prostate gland. 5. Aortic atherosclerosis. Aortic Atherosclerosis (ICD10-I70.0). Electronically Signed   By: Aram Candela M.D.   On: 11/11/2023 00:12   DG Chest Port 1 View Result Date: 11/10/2023 CLINICAL DATA:  Questionable sepsis. EXAM: PORTABLE CHEST 1 VIEW COMPARISON:  September 27, 2023 FINDINGS: The heart size and mediastinal contours are within normal limits. There is no evidence of an acute infiltrate, pleural effusion or pneumothorax. Multilevel degenerative changes and postoperative changes are seen involving the thoracic spine. IMPRESSION: No active cardiopulmonary disease. Electronically Signed   By: Aram Candela M.D.   On: 11/10/2023 22:07      Subjective: Patient seen and examined at bedside.  Denies worsening shortness of breath, fever, vomiting.   Discharge Exam: Vitals:   11/16/23 1145 11/16/23 1530  BP: (!) 97/57 (!) 140/70  Pulse: 60 (!) 58  Resp:  18  Temp:  97.9 F (36.6 C)  SpO2:  97%    General: Currently on room air.  No distress ENT/neck: No thyromegaly.  JVD is not elevated  respiratory: Decreased breath sounds at bases bilaterally with some crackles; no wheezing  CVS: S1-S2 heard, rate controlled currently Abdominal: Soft, nontender, slightly distended; no organomegaly, bowel sounds are heard Extremities: Trace lower extremity edema; no cyanosis  CNS: Awake and alert.  No focal neurologic deficit.  Moves extremities Lymph: No obvious lymphadenopathy Skin: No obvious ecchymosis/lesions  psych: Affect, judgment and mood are normal  musculoskeletal: No obvious joint swelling/deformity     The results of significant diagnostics from this hospitalization (including imaging, microbiology, ancillary and laboratory) are listed below for reference.     Microbiology: Recent Results (from the past 240 hours)  Blood Culture (routine x 2)     Status: Abnormal   Collection Time:  11/10/23  9:09 PM   Specimen: BLOOD LEFT FOREARM  Result Value Ref Range Status   Specimen Description BLOOD LEFT FOREARM  Final   Special Requests  Final    BOTTLES DRAWN AEROBIC AND ANAEROBIC Blood Culture adequate volume   Culture  Setup Time   Final    GRAM POSITIVE COCCI IN CHAINS IN BOTH AEROBIC AND ANAEROBIC BOTTLES CRITICAL RESULT CALLED TO, READ BACK BY AND VERIFIED WITH: E. SINCLAIR PHARMD, AT 1025 11/11/23 D. VANHOOK Performed at [facility], [address] [place], [place] [address]    Culture STREPTOCOCCUS GROUP G (A)  Final   Report Status 11/13/2023 FINAL  Final   Organism ID, Bacteria STREPTOCOCCUS GROUP G  Final      Susceptibility   Streptococcus group g - MIC*    CLINDAMYCIN <=0.25 SENSITIVE Sensitive     AMPICILLIN <=0.25 SENSITIVE Sensitive     ERYTHROMYCIN <=0.12 SENSITIVE Sensitive     VANCOMYCIN 0.5 SENSITIVE Sensitive     CEFTRIAXONE <=0.12 SENSITIVE Sensitive     LEVOFLOXACIN 0.5 SENSITIVE Sensitive     PENICILLIN <=0.06 SENSITIVE Sensitive     * STREPTOCOCCUS GROUP G  Blood Culture ID Panel (Reflexed)     Status: Abnormal   Collection Time: 11/10/23  9:09 PM  Result Value Ref Range Status   Enterococcus faecalis NOT DETECTED NOT DETECTED Final   Enterococcus Faecium NOT DETECTED NOT DETECTED Final   Listeria monocytogenes NOT DETECTED NOT DETECTED Final   Staphylococcus species NOT DETECTED NOT DETECTED Final   Staphylococcus aureus (BCID) NOT DETECTED NOT DETECTED Final   Staphylococcus epidermidis NOT DETECTED NOT DETECTED Final   Staphylococcus lugdunensis NOT DETECTED NOT DETECTED Final   Streptococcus species DETECTED (A) NOT DETECTED Final    Comment: Not Enterococcus species, Streptococcus agalactiae, Streptococcus pyogenes, or Streptococcus pneumoniae. CRITICAL RESULT CALLED TO, READ BACK BY AND VERIFIED WITH: E. SINCLAIR PHARMD, AT 1025 11/11/23 D. VANHOOK    Streptococcus agalactiae NOT DETECTED NOT DETECTED Final   Streptococcus  pneumoniae NOT DETECTED NOT DETECTED Final   Streptococcus pyogenes NOT DETECTED NOT DETECTED Final   A.calcoaceticus-baumannii NOT DETECTED NOT DETECTED Final   Bacteroides fragilis NOT DETECTED NOT DETECTED Final   Enterobacterales NOT DETECTED NOT DETECTED Final   Enterobacter cloacae complex NOT DETECTED NOT DETECTED Final   Escherichia coli NOT DETECTED NOT DETECTED Final   Klebsiella aerogenes NOT DETECTED NOT DETECTED Final   Klebsiella oxytoca NOT DETECTED NOT DETECTED Final   Klebsiella pneumoniae NOT DETECTED NOT DETECTED Final   Proteus species NOT DETECTED NOT DETECTED Final   Salmonella species NOT DETECTED NOT DETECTED Final   Serratia marcescens NOT DETECTED NOT DETECTED Final   Haemophilus influenzae NOT DETECTED NOT DETECTED Final   Neisseria meningitidis NOT DETECTED NOT DETECTED Final   Pseudomonas aeruginosa NOT DETECTED NOT DETECTED Final   Stenotrophomonas maltophilia NOT DETECTED NOT DETECTED Final   Candida albicans NOT DETECTED NOT DETECTED Final   Candida auris NOT DETECTED NOT DETECTED Final   Candida glabrata NOT DETECTED NOT DETECTED Final   Candida krusei NOT DETECTED NOT DETECTED Final   Candida parapsilosis NOT DETECTED NOT DETECTED Final   Candida tropicalis NOT DETECTED NOT DETECTED Final   Cryptococcus neoformans/gattii NOT DETECTED NOT DETECTED Final    Comment: Performed at [facility], [address], [place], [place] [address]  Blood Culture (routine x 2)     Status: Abnormal   Collection Time: 11/10/23  9:20 PM   Specimen: BLOOD RIGHT FOREARM  Result Value Ref Range Status   Specimen Description BLOOD RIGHT FOREARM  Final   Special Requests   Final    BOTTLES DRAWN AEROBIC AND  ANAEROBIC Blood Culture adequate volume   Culture  Setup Time   Final    GRAM POSITIVE COCCI IN CHAINS IN BOTH AEROBIC AND ANAEROBIC BOTTLES CRITICAL RESULT CALLED TO, READ BACK BY AND VERIFIED WITH: E. SINCLAIR PHARMD, AT 1025 11/11/23 D. VANHOOK    Culture (A)   Final    STREPTOCOCCUS GROUP G SUSCEPTIBILITIES PERFORMED ON PREVIOUS CULTURE WITHIN THE LAST 5 DAYS. Performed at [facility], [address], [place], [place] [address]    Report Status 11/13/2023 FINAL  Final  Resp panel by RT-PCR (RSV, Flu A&B, Covid) Anterior Nasal Swab     Status: None   Collection Time: 11/10/23 11:21 PM   Specimen: Anterior Nasal Swab  Result Value Ref Range Status   SARS Coronavirus 2 by RT PCR NEGATIVE NEGATIVE Final   Influenza A by PCR NEGATIVE NEGATIVE Final   Influenza B by PCR NEGATIVE NEGATIVE Final    Comment: (NOTE) The Xpert Xpress SARS-CoV-2/FLU/RSV plus assay is intended as an aid in the diagnosis of influenza from Nasopharyngeal swab specimens and should not be used as a sole basis for treatment. Nasal washings and aspirates are unacceptable for Xpert Xpress SARS-CoV-2/FLU/RSV testing.  Fact Sheet for Patients: BloggerCourse.com  Fact Sheet for Healthcare Providers: SeriousBroker.it  This test is not yet approved or cleared by the [place] FDA and has been authorized for detection and/or diagnosis of SARS-CoV-2 by FDA under an Emergency Use Authorization (EUA). This EUA will remain in effect (meaning this test can be used) for the duration of the COVID-19 declaration under Section 564(b)(1) of the Act, 21 U.S.C. section 360bbb-3(b)(1), unless the authorization is terminated or revoked.     Resp Syncytial Virus by PCR NEGATIVE NEGATIVE Final    Comment: (NOTE) Fact Sheet for Patients: BloggerCourse.com  Fact Sheet for Healthcare Providers: SeriousBroker.it  This test is not yet approved or cleared by the [place] FDA and has been authorized for detection and/or diagnosis of SARS-CoV-2 by FDA under an Emergency Use Authorization (EUA). This EUA will remain in effect (meaning this test can be used) for the duration of  the COVID-19 declaration under Section 564(b)(1) of the Act, 21 U.S.C. section 360bbb-3(b)(1), unless the authorization is terminated or revoked.  Performed at [facility], [address], [place], [place] [address]   Culture, blood (Routine X 2) w Reflex to ID Panel     Status: None (Preliminary result)   Collection Time: 11/12/23 11:14 AM   Specimen: BLOOD LEFT ARM  Result Value Ref Range Status   Specimen Description BLOOD LEFT ARM  Final   Special Requests   Final    BOTTLES DRAWN AEROBIC AND ANAEROBIC Blood Culture results may not be optimal due to an inadequate volume of blood received in culture bottles   Culture   Final    NO GROWTH 4 DAYS Performed at [facility], [address], [place], [place] [address]    Report Status PENDING  Incomplete  Culture, blood (Routine X 2) w Reflex to ID Panel     Status: None (Preliminary result)   Collection Time: 11/12/23 11:25 AM   Specimen: BLOOD LEFT HAND  Result Value Ref Range Status   Specimen Description BLOOD LEFT HAND  Final   Special Requests   Final    BOTTLES DRAWN AEROBIC AND ANAEROBIC Blood Culture results may not be optimal due to an inadequate volume of blood received in culture bottles   Culture   Final  NO GROWTH 4 DAYS Performed at [facility], [address] [place], [place] [address]    Report Status PENDING  Incomplete     Labs: BNP (last 3 results) No results for input(s): "BNP" in the last 8760 hours. Basic Metabolic Panel: Recent Labs  Lab 11/10/23 2109 11/11/23 0432 11/11/23 1910 11/12/23 0521 11/13/23 0300 11/14/23 0252  NA 140 137  --  133* 137 137  K 4.4 4.0  --  3.9 4.3 4.0  CL 106 107  --  108 108 111  CO2 22 21*  --  22 21* 22  GLUCOSE 126* 65*  --  138* 212* 132*  BUN 22 21  --  24* 32* 25*  CREATININE 1.19 1.17  --  1.24 1.15 0.97  CALCIUM 9.8 8.8*  --  8.5* 8.9 8.6*  MG  --  1.3* 2.0 2.1  --   --    Liver Function Tests: Recent Labs  Lab 11/10/23 2109  11/11/23 0432  AST 27 23  ALT 24 20  ALKPHOS 45 30*  BILITOT 1.6* 1.8*  PROT 6.5 5.2*  ALBUMIN 3.5 2.8*   No results for input(s): "LIPASE", "AMYLASE" in the last 168 hours. No results for input(s): "AMMONIA" in the last 168 hours. CBC: Recent Labs  Lab 11/10/23 2109 11/11/23 0244 11/11/23 0432 11/12/23 0521 11/13/23 0300 11/14/23 0252  WBC 13.5* 18.3* 17.6* 12.0* 12.0* 7.3  NEUTROABS 12.0* 16.1*  --   --   --   --   HGB 14.1 13.5 12.9* 12.0* 13.0 12.8*  HCT 42.2 39.8 38.7* 37.1* 37.4* 37.5*  MCV 92.1 92.6 93.0 93.0 89.3 89.9  PLT 200 186 166 174 187 216   Cardiac Enzymes: No results for input(s): "CKTOTAL", "CKMB", "CKMBINDEX", "TROPONINI" in the last 168 hours. BNP: Invalid input(s): "POCBNP" CBG: Recent Labs  Lab 11/15/23 1600 11/15/23 2107 11/16/23 0624 11/16/23 1137 11/16/23 1527  GLUCAP 171* 168* 178* 139* 223*   D-Dimer No results for input(s): "DDIMER" in the last 72 hours. Hgb A1c No results for input(s): "HGBA1C" in the last 72 hours. Lipid Profile No results for input(s): "CHOL", "HDL", "LDLCALC", "TRIG", "CHOLHDL", "LDLDIRECT" in the last 72 hours. Thyroid function studies No results for input(s): "TSH", "T4TOTAL", "T3FREE", "THYROIDAB" in the last 72 hours.  Invalid input(s): "FREET3" Anemia work up No results for input(s): "VITAMINB12", "FOLATE", "FERRITIN", "TIBC", "IRON", "RETICCTPCT" in the last 72 hours. Urinalysis    Component Value Date/Time   COLORURINE YELLOW 11/10/2023 2353   APPEARANCEUR HAZY (A) 11/10/2023 2353   LABSPEC 1.037 (H) 11/10/2023 2353   PHURINE 5.0 11/10/2023 2353   GLUCOSEU >=500 (A) 11/10/2023 2353   HGBUR NEGATIVE 11/10/2023 2353   BILIRUBINUR NEGATIVE 11/10/2023 2353   KETONESUR NEGATIVE 11/10/2023 2353   PROTEINUR NEGATIVE 11/10/2023 2353   UROBILINOGEN 0.2 04/04/2015 2047   NITRITE NEGATIVE 11/10/2023 2353   LEUKOCYTESUR NEGATIVE 11/10/2023 2353   Sepsis Labs Recent Labs  Lab 11/11/23 0432  11/12/23 0521 11/13/23 0300 11/14/23 0252  WBC 17.6* 12.0* 12.0* 7.3   Microbiology Recent Results (from the past 240 hours)  Blood Culture (routine x 2)     Status: Abnormal   Collection Time: 11/10/23  9:09 PM   Specimen: BLOOD LEFT FOREARM  Result Value Ref Range Status   Specimen Description BLOOD LEFT FOREARM  Final   Special Requests   Final    BOTTLES DRAWN AEROBIC AND ANAEROBIC Blood Culture adequate volume   Culture  Setup Time   Final  GRAM POSITIVE COCCI IN CHAINS IN BOTH AEROBIC AND ANAEROBIC BOTTLES CRITICAL RESULT CALLED TO, READ BACK BY AND VERIFIED WITH: E. SINCLAIR PHARMD, AT 1025 11/11/23 D. VANHOOK Performed at [facility], [address] [place], [place] [address]    Culture STREPTOCOCCUS GROUP G (A)  Final   Report Status 11/13/2023 FINAL  Final   Organism ID, Bacteria STREPTOCOCCUS GROUP G  Final      Susceptibility   Streptococcus group g - MIC*    CLINDAMYCIN <=0.25 SENSITIVE Sensitive     AMPICILLIN <=0.25 SENSITIVE Sensitive     ERYTHROMYCIN <=0.12 SENSITIVE Sensitive     VANCOMYCIN 0.5 SENSITIVE Sensitive     CEFTRIAXONE <=0.12 SENSITIVE Sensitive     LEVOFLOXACIN 0.5 SENSITIVE Sensitive     PENICILLIN <=0.06 SENSITIVE Sensitive     * STREPTOCOCCUS GROUP G  Blood Culture ID Panel (Reflexed)     Status: Abnormal   Collection Time: 11/10/23  9:09 PM  Result Value Ref Range Status   Enterococcus faecalis NOT DETECTED NOT DETECTED Final   Enterococcus Faecium NOT DETECTED NOT DETECTED Final   Listeria monocytogenes NOT DETECTED NOT DETECTED Final   Staphylococcus species NOT DETECTED NOT DETECTED Final   Staphylococcus aureus (BCID) NOT DETECTED NOT DETECTED Final   Staphylococcus epidermidis NOT DETECTED NOT DETECTED Final   Staphylococcus lugdunensis NOT DETECTED NOT DETECTED Final   Streptococcus species DETECTED (A) NOT DETECTED Final    Comment: Not Enterococcus species, Streptococcus agalactiae, Streptococcus pyogenes, or  Streptococcus pneumoniae. CRITICAL RESULT CALLED TO, READ BACK BY AND VERIFIED WITH: E. SINCLAIR PHARMD, AT 1025 11/11/23 D. VANHOOK    Streptococcus agalactiae NOT DETECTED NOT DETECTED Final   Streptococcus pneumoniae NOT DETECTED NOT DETECTED Final   Streptococcus pyogenes NOT DETECTED NOT DETECTED Final   A.calcoaceticus-baumannii NOT DETECTED NOT DETECTED Final   Bacteroides fragilis NOT DETECTED NOT DETECTED Final   Enterobacterales NOT DETECTED NOT DETECTED Final   Enterobacter cloacae complex NOT DETECTED NOT DETECTED Final   Escherichia coli NOT DETECTED NOT DETECTED Final   Klebsiella aerogenes NOT DETECTED NOT DETECTED Final   Klebsiella oxytoca NOT DETECTED NOT DETECTED Final   Klebsiella pneumoniae NOT DETECTED NOT DETECTED Final   Proteus species NOT DETECTED NOT DETECTED Final   Salmonella species NOT DETECTED NOT DETECTED Final   Serratia marcescens NOT DETECTED NOT DETECTED Final   Haemophilus influenzae NOT DETECTED NOT DETECTED Final   Neisseria meningitidis NOT DETECTED NOT DETECTED Final   Pseudomonas aeruginosa NOT DETECTED NOT DETECTED Final   Stenotrophomonas maltophilia NOT DETECTED NOT DETECTED Final   Candida albicans NOT DETECTED NOT DETECTED Final   Candida auris NOT DETECTED NOT DETECTED Final   Candida glabrata NOT DETECTED NOT DETECTED Final   Candida krusei NOT DETECTED NOT DETECTED Final   Candida parapsilosis NOT DETECTED NOT DETECTED Final   Candida tropicalis NOT DETECTED NOT DETECTED Final   Cryptococcus neoformans/gattii NOT DETECTED NOT DETECTED Final    Comment: Performed at [facility], [address] [place], [place] [address]  Blood Culture (routine x 2)     Status: Abnormal   Collection Time: 11/10/23  9:20 PM   Specimen: BLOOD RIGHT FOREARM  Result Value Ref Range Status   Specimen Description BLOOD RIGHT FOREARM  Final   Special Requests   Final    BOTTLES DRAWN AEROBIC AND ANAEROBIC Blood Culture adequate volume   Culture   Setup Time   Final    GRAM POSITIVE COCCI IN CHAINS IN BOTH  AEROBIC AND ANAEROBIC BOTTLES CRITICAL RESULT CALLED TO, READ BACK BY AND VERIFIED WITH: E. SINCLAIR PHARMD, AT 1025 11/11/23 D. VANHOOK    Culture (A)  Final    STREPTOCOCCUS GROUP G SUSCEPTIBILITIES PERFORMED ON PREVIOUS CULTURE WITHIN THE LAST 5 DAYS. Performed at [facility], [address], [place], [place] [address]    Report Status 11/13/2023 FINAL  Final  Resp panel by RT-PCR (RSV, Flu A&B, Covid) Anterior Nasal Swab     Status: None   Collection Time: 11/10/23 11:21 PM   Specimen: Anterior Nasal Swab  Result Value Ref Range Status   SARS Coronavirus 2 by RT PCR NEGATIVE NEGATIVE Final   Influenza A by PCR NEGATIVE NEGATIVE Final   Influenza B by PCR NEGATIVE NEGATIVE Final    Comment: (NOTE) The Xpert Xpress SARS-CoV-2/FLU/RSV plus assay is intended as an aid in the diagnosis of influenza from Nasopharyngeal swab specimens and should not be used as a sole basis for treatment. Nasal washings and aspirates are unacceptable for Xpert Xpress SARS-CoV-2/FLU/RSV testing.  Fact Sheet for Patients: BloggerCourse.com  Fact Sheet for Healthcare Providers: SeriousBroker.it  This test is not yet approved or cleared by the [place] FDA and has been authorized for detection and/or diagnosis of SARS-CoV-2 by FDA under an Emergency Use Authorization (EUA). This EUA will remain in effect (meaning this test can be used) for the duration of the COVID-19 declaration under Section 564(b)(1) of the Act, 21 U.S.C. section 360bbb-3(b)(1), unless the authorization is terminated or revoked.     Resp Syncytial Virus by PCR NEGATIVE NEGATIVE Final    Comment: (NOTE) Fact Sheet for Patients: BloggerCourse.com  Fact Sheet for Healthcare Providers: SeriousBroker.it  This test is not yet approved or cleared by the [place] FDA and has been authorized for detection and/or diagnosis of SARS-CoV-2 by FDA under an Emergency Use Authorization (EUA). This EUA will remain in effect (meaning this test can be used) for the duration of the COVID-19 declaration under Section 564(b)(1) of the Act, 21 U.S.C. section 360bbb-3(b)(1), unless the authorization is terminated or revoked.  Performed at [facility], [address] [place], [place] [address]   Culture, blood (Routine X 2) w Reflex to ID Panel     Status: None (Preliminary result)   Collection Time: 11/12/23 11:14 AM   Specimen: BLOOD LEFT ARM  Result Value Ref Range Status   Specimen Description BLOOD LEFT ARM  Final   Special Requests   Final    BOTTLES DRAWN AEROBIC AND ANAEROBIC Blood Culture results may not be optimal due to an inadequate volume of blood received in culture bottles   Culture   Final    NO GROWTH 4 DAYS Performed at [facility], [address] [place], [place] [address]    Report Status PENDING  Incomplete  Culture, blood (Routine X 2) w Reflex to ID Panel     Status: None (Preliminary result)   Collection Time: 11/12/23 11:25 AM   Specimen: BLOOD LEFT HAND  Result Value Ref Range Status   Specimen Description BLOOD LEFT HAND  Final   Special Requests   Final    BOTTLES DRAWN AEROBIC AND ANAEROBIC Blood Culture results may not be optimal due to an inadequate volume of blood received in culture bottles   Culture   Final    NO GROWTH 4 DAYS Performed at [facility], [address], [place], [place] [address]    Report Status PENDING  Incomplete     Time coordinating discharge: 35 minutes  SIGNED:   Glade Lloyd, MD  [facility] 11/16/2023, 5:06 PM

## 2023-11-16 NOTE — Op Note (Signed)
 INDICATIONS: Endocarditis, Streptococcal  PROCEDURE:   Informed consent was obtained prior to the procedure. The risks, benefits and alternatives for the procedure were discussed and the patient comprehended these risks.  Risks include, but are not limited to, cough, sore throat, vomiting, nausea, somnolence, esophageal and stomach trauma or perforation, bleeding, low blood pressure, aspiration, pneumonia, infection, trauma to the teeth and death.    After a procedural time-out, the oropharynx was anesthetized with 20% benzocaine spray.   During this procedure the patient was administered IV propofol by Anesthesiology, Dr. Stephannie Peters  The transesophageal probe was inserted in the esophagus and stomach without difficulty and multiple views were obtained.  The patient was kept under observation until the patient left the procedure room.  The patient left the procedure room in stable condition.   Agitated microbubble saline contrast was not administered.  COMPLICATIONS:    There were no immediate complications.  FINDINGS:  No vegetations are seen. 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 LVEF 45-50% without regional wall motion abnormalities.  RECOMMENDATIONS:     No TEE evidence of endocarditis.  Time Spent Directly with the Patient:  30 minutes   Regan Llorente 11/16/2023, 10:35 AM

## 2023-11-16 NOTE — TOC Progression Note (Signed)
 Transition of Care [facility]) - Progression Note    Patient Details  Name: Anthony Case MRN: [id number] Date of Birth: Dec 16, 1950  [facility] of [facility]) CM/SW Contact  Leone Haven, RN Phone Number: 11/16/2023, 3:13 PM  Clinical Narrative:    For TEE today, was also on xarelto pta .  TOC following.        Expected Discharge Plan and Services                                               Social Determinants of Health (SDOH) Interventions SDOH Screenings   Food Insecurity: No Food Insecurity (11/11/2023)  Housing: Low Risk  (11/11/2023)  Transportation Needs: No Transportation Needs (11/11/2023)  Utilities: Not At Risk (11/11/2023)  Social Connections: Socially Integrated (11/11/2023)  Tobacco Use: Low Risk  (11/11/2023)    Readmission Risk Interventions     No data to display

## 2023-11-16 NOTE — Transfer of Care (Signed)
 Immediate Anesthesia Transfer of Care Note  Patient: Anthony Case  Procedure(s) Performed: TRANSESOPHAGEAL ECHOCARDIOGRAM  Patient Location: PACU  Anesthesia Type:MAC  Level of Consciousness: sedated  Airway & Oxygen Therapy: Patient Spontanous Breathing  Post-op Assessment: Report given to RN  Post vital signs: Reviewed and stable  Last Vitals:  Vitals Value Taken Time  BP 89/65   Temp    Pulse 54 11/16/23 1037  Resp 24 11/16/23 1037  SpO2 98 % 11/16/23 1037  Vitals shown include unfiled device data.  Last Pain:  Vitals:   11/16/23 0928  TempSrc: Temporal  PainSc:          Complications: No notable events documented.

## 2023-11-16 NOTE — Anesthesia Preprocedure Evaluation (Signed)
 Anesthesia Evaluation  Patient identified by MRN, date of birth, ID band Patient awake    Reviewed: Allergy & Precautions, Patient's Chart, lab work & pertinent test results  History of Anesthesia Complications (+) DIFFICULT AIRWAY and history of anesthetic complications (previous Glidescope intubation)  Airway Mallampati: II  TM Distance: >3 FB Neck ROM: Full    Dental no notable dental hx.    Pulmonary sleep apnea    Pulmonary exam normal        Cardiovascular hypertension, +CHF  Normal cardiovascular exam+ dysrhythmias Atrial Fibrillation   TTE 10/24/23: EF 40-45%, global hypokinesis,mild LVH, grade I DD, moderate AS, borderline dilatation of ascending aorta measuring 39mm    Neuro/Psych negative neurological ROS     GI/Hepatic negative GI ROS, Neg liver ROS,,,  Endo/Other  diabetes, Type 2, Insulin Dependent, Oral Hypoglycemic Agents    Renal/GU Renal InsufficiencyRenal disease     Musculoskeletal  (+) Arthritis ,    Abdominal   Peds  Hematology  (+) Blood dyscrasia (Hgb 12.8), anemia   Anesthesia Other Findings bacteremia  Reproductive/Obstetrics                              Anesthesia Physical Anesthesia Plan  ASA: 3  Anesthesia Plan: MAC   Post-op Pain Management: Minimal or no pain anticipated   Induction:   PONV Risk Score and Plan: Treatment may vary due to age or medical condition and Propofol infusion  Airway Management Planned: Natural Airway and Nasal Cannula  Additional Equipment: None  Intra-op Plan:   Post-operative Plan:   Informed Consent: I have reviewed the patients History and Physical, chart, labs and discussed the procedure including the risks, benefits and alternatives for the proposed anesthesia with the patient or authorized representative who has indicated his/her understanding and acceptance.       Plan Discussed with: CRNA  Anesthesia Plan  Comments:          Anesthesia Quick Evaluation

## 2023-11-16 NOTE — Anesthesia Postprocedure Evaluation (Signed)
 Anesthesia Post Note  Patient: Anthony Case  Procedure(s) Performed: TRANSESOPHAGEAL ECHOCARDIOGRAM     Patient location during evaluation: PACU Anesthesia Type: MAC Level of consciousness: awake and alert Pain management: pain level controlled Vital Signs Assessment: post-procedure vital signs reviewed and stable Respiratory status: spontaneous breathing, nonlabored ventilation and respiratory function stable Cardiovascular status: blood pressure returned to baseline Postop Assessment: no apparent nausea or vomiting Anesthetic complications: no   No notable events documented.  Last Vitals:  Vitals:   11/16/23 1050 11/16/23 1055  BP: (!) 83/59 (!) 88/55  Pulse: (!) 57 (!) 55  Resp: 10 14  Temp: (!) 36.2 C   SpO2: 96% 96%    Last Pain:  Vitals:   11/16/23 0928  TempSrc: Temporal  PainSc:                  [name]

## 2023-11-16 NOTE — Progress Notes (Addendum)
 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 Repeat blood cx NG in 4 days. 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 Patient is followed by podiatry for diabetic ulcer of toe - Blood cultures from admission grew 2/2 group B strep.  CT abdomen pelvis showed cholelithiasis.  Chest x-ray no active disease.  X-ray of right foot showed soft tissue ulceration. - Noted to have erythema at right toe.  Patient denies any injury to that region. - MRI right toes showed mild soft tissue swelling consistent with cellulitis, no osteo or abscess. - X-ray right ankle no osteo noted - TTE showed calcified aortic valve with aortic stenosis.  Technically difficult study. TEE with no vegetations or endocarditis    Recommendations - will change IV cefazolin to PO amoxicillin to complete 2 weeks course from  negative blood cultures. EOT 11/26/23 - Immunosuppressant to be hold at least till the duration of abtx if not longer. Fu with Rheumatology for resuming  - Fu with Podiatry for rt toe wound ( clinically healing) - Standard isolation precautions - ID will so. Recall back if needed, questions.   Rest of the management as per the primary team. Thank you for the consult. Please page with pertinent questions or concerns.  ______________________________________________________________________ Subjective patient seen and examined at the bedside. No concerns, eager to go home  Past Medical History:  Diagnosis Date   Arthritis    Diabetes mellitus    Hx pulmonary embolism 08/26/2007   required emergent tPA treatment   Hypertension    Psoriatic arthritis (HCC) 09/27/2023   Shortness of breath    Sleep apnea    Past Surgical History:  Procedure Laterality Date   BACK SURGERY     COLONOSCOPY  2009   adenoma polyp   CYSTOSCOPY/URETEROSCOPY/HOLMIUM LASER/STENT PLACEMENT Left 07/02/2021   Procedure: CYSTOSCOPY/RETROGRADE/URETEROSCOPY/HOLMIUM LASER/STENT PLACEMENT;  Surgeon: Jerilee Field, MD;  Location: [facility];  Service: Urology;  Laterality: Left;   ELBOW ARTHROSCOPY  2010   IR RADIOLOGIST EVAL & MGMT  12/25/2020   IR RADIOLOGIST EVAL & MGMT  12/28/2020   LUMBAR PERCUTANEOUS PEDICLE SCREW 3 LEVEL N/A 05/25/2013   Procedure: Posterior Percutaneous Fixation from Thoracic nine to Lumbar one vertebrae with arthrodesis of Thoracic eleven Fracture;  Surgeon: Barnett Abu, MD;  Location: [facility];  Service: Neurosurgery;  Laterality: N/A;  Posterior Percutaneous Fixation from Thoracic nine to Lumbar one vertebrae with arthrodesis of Thoracic eleven Fracture   RIGHT/LEFT HEART CATH AND CORONARY ANGIOGRAPHY N/A 08/27/2021   Procedure: RIGHT/LEFT HEART CATH  AND CORONARY ANGIOGRAPHY;  Surgeon: Yates Decamp, MD;  Location: [facility];  Service: Cardiovascular;  Laterality: N/A;    Vitals BP (!)  97/57   Pulse 60   Temp 97.8 F (36.6 C) (Oral)   Resp 18   Ht 5\' 11"  (1.803 m)   Wt 112.6 kg   SpO2 94%   BMI 34.62 kg/m     Physical Exam Constitutional:  adult male sitting in the bed, comfortable, not in acute distress    Comments: HEENT wnl  Cardiovascular:     Rate and Rhythm: Normal rate and regular rhythm.     Heart sounds: s1s2  Pulmonary:     Effort: Pulmonary effort is normal.     Comments: Normal breath sounds   Abdominal:     Palpations: Abdomen is soft.     Tenderness: non distended and non tender  Musculoskeletal:        General: No swelling or tenderness in peripheral joints. Rt great toe wound healing   Skin:    Comments: no rashes   Neurological:     General:  awake, alert and oriented, grossly non focal   Psychiatric:        Mood and Affect: Mood normal.   Pertinent Microbiology Results for orders placed or performed during the hospital encounter of 11/10/23  Blood Culture (routine x 2)     Status: Abnormal   Collection Time: 11/10/23  9:09 PM   Specimen: BLOOD LEFT FOREARM  Result Value Ref Range Status   Specimen Description BLOOD LEFT FOREARM  Final   Special Requests   Final    BOTTLES DRAWN AEROBIC AND ANAEROBIC Blood Culture adequate volume   Culture  Setup Time   Final    GRAM POSITIVE COCCI IN CHAINS IN BOTH AEROBIC AND ANAEROBIC BOTTLES CRITICAL RESULT CALLED TO, READ BACK BY AND VERIFIED WITH: E. SINCLAIR PHARMD, AT 1025 11/11/23 D. VANHOOK Performed at [facility], [address], [place], [place] [address]    Culture STREPTOCOCCUS GROUP G (A)  Final   Report Status 11/13/2023 FINAL  Final   Organism ID, Bacteria STREPTOCOCCUS GROUP G  Final      Susceptibility   Streptococcus group g - MIC*    CLINDAMYCIN <=0.25 SENSITIVE Sensitive     AMPICILLIN <=0.25 SENSITIVE Sensitive     ERYTHROMYCIN <=0.12 SENSITIVE Sensitive     VANCOMYCIN 0.5 SENSITIVE Sensitive     CEFTRIAXONE <=0.12 SENSITIVE Sensitive     LEVOFLOXACIN  0.5 SENSITIVE Sensitive     PENICILLIN <=0.06 SENSITIVE Sensitive     * STREPTOCOCCUS GROUP G  Blood Culture ID Panel (Reflexed)     Status: Abnormal   Collection Time: 11/10/23  9:09 PM  Result Value Ref Range Status   Enterococcus faecalis NOT DETECTED NOT DETECTED Final   Enterococcus Faecium NOT DETECTED NOT DETECTED Final   Listeria monocytogenes NOT DETECTED NOT DETECTED Final   Staphylococcus species NOT DETECTED NOT DETECTED Final   Staphylococcus aureus (BCID) NOT DETECTED NOT DETECTED Final   Staphylococcus epidermidis NOT DETECTED NOT DETECTED Final   Staphylococcus lugdunensis NOT DETECTED NOT DETECTED Final   Streptococcus species DETECTED (A) NOT DETECTED Final    Comment: Not Enterococcus species, Streptococcus agalactiae, Streptococcus pyogenes, or Streptococcus pneumoniae. CRITICAL RESULT CALLED TO, READ BACK BY AND VERIFIED WITH: E. SINCLAIR PHARMD, AT 1025 11/11/23 D. VANHOOK    Streptococcus agalactiae NOT DETECTED NOT DETECTED Final   Streptococcus pneumoniae NOT DETECTED NOT DETECTED Final  Streptococcus pyogenes NOT DETECTED NOT DETECTED Final   A.calcoaceticus-baumannii NOT DETECTED NOT DETECTED Final   Bacteroides fragilis NOT DETECTED NOT DETECTED Final   Enterobacterales NOT DETECTED NOT DETECTED Final   Enterobacter cloacae complex NOT DETECTED NOT DETECTED Final   Escherichia coli NOT DETECTED NOT DETECTED Final   Klebsiella aerogenes NOT DETECTED NOT DETECTED Final   Klebsiella oxytoca NOT DETECTED NOT DETECTED Final   Klebsiella pneumoniae NOT DETECTED NOT DETECTED Final   Proteus species NOT DETECTED NOT DETECTED Final   Salmonella species NOT DETECTED NOT DETECTED Final   Serratia marcescens NOT DETECTED NOT DETECTED Final   Haemophilus influenzae NOT DETECTED NOT DETECTED Final   Neisseria meningitidis NOT DETECTED NOT DETECTED Final   Pseudomonas aeruginosa NOT DETECTED NOT DETECTED Final   Stenotrophomonas maltophilia NOT DETECTED NOT DETECTED  Final   Candida albicans NOT DETECTED NOT DETECTED Final   Candida auris NOT DETECTED NOT DETECTED Final   Candida glabrata NOT DETECTED NOT DETECTED Final   Candida krusei NOT DETECTED NOT DETECTED Final   Candida parapsilosis NOT DETECTED NOT DETECTED Final   Candida tropicalis NOT DETECTED NOT DETECTED Final   Cryptococcus neoformans/gattii NOT DETECTED NOT DETECTED Final    Comment: Performed at [facility], [address], [place], [place] [address]  Blood Culture (routine x 2)     Status: Abnormal   Collection Time: 11/10/23  9:20 PM   Specimen: BLOOD RIGHT FOREARM  Result Value Ref Range Status   Specimen Description BLOOD RIGHT FOREARM  Final   Special Requests   Final    BOTTLES DRAWN AEROBIC AND ANAEROBIC Blood Culture adequate volume   Culture  Setup Time   Final    GRAM POSITIVE COCCI IN CHAINS IN BOTH AEROBIC AND ANAEROBIC BOTTLES CRITICAL RESULT CALLED TO, READ BACK BY AND VERIFIED WITH: E. SINCLAIR PHARMD, AT 1025 11/11/23 D. VANHOOK    Culture (A)  Final    STREPTOCOCCUS GROUP G SUSCEPTIBILITIES PERFORMED ON PREVIOUS CULTURE WITHIN THE LAST 5 DAYS. Performed at [facility], [address], [place], [place] [address]    Report Status 11/13/2023 FINAL  Final  Resp panel by RT-PCR (RSV, Flu A&B, Covid) Anterior Nasal Swab     Status: None   Collection Time: 11/10/23 11:21 PM   Specimen: Anterior Nasal Swab  Result Value Ref Range Status   SARS Coronavirus 2 by RT PCR NEGATIVE NEGATIVE Final   Influenza A by PCR NEGATIVE NEGATIVE Final   Influenza B by PCR NEGATIVE NEGATIVE Final    Comment: (NOTE) The Xpert Xpress SARS-CoV-2/FLU/RSV plus assay is intended as an aid in the diagnosis of influenza from Nasopharyngeal swab specimens and should not be used as a sole basis for treatment. Nasal washings and aspirates are unacceptable for Xpert Xpress SARS-CoV-2/FLU/RSV testing.  Fact Sheet for Patients: BloggerCourse.com  Fact  Sheet for Healthcare Providers: SeriousBroker.it  This test is not yet approved or cleared by the [place] FDA and has been authorized for detection and/or diagnosis of SARS-CoV-2 by FDA under an Emergency Use Authorization (EUA). This EUA will remain in effect (meaning this test can be used) for the duration of the COVID-19 declaration under Section 564(b)(1) of the Act, 21 U.S.C. section 360bbb-3(b)(1), unless the authorization is terminated or revoked.     Resp Syncytial Virus by PCR NEGATIVE NEGATIVE Final    Comment: (NOTE) Fact Sheet for Patients: BloggerCourse.com  Fact Sheet for Healthcare Providers: SeriousBroker.it  This test is not yet approved or  cleared by the [place] and has been authorized for detection and/or diagnosis of SARS-CoV-2 by FDA under an Emergency Use Authorization (EUA). This EUA will remain in effect (meaning this test can be used) for the duration of the COVID-19 declaration under Section 564(b)(1) of the Act, 21 U.S.C. section 360bbb-3(b)(1), unless the authorization is terminated or revoked.  Performed at [facility], [address] [place], [place] [address]   Culture, blood (Routine X 2) w Reflex to ID Panel     Status: None (Preliminary result)   Collection Time: 11/12/23 11:14 AM   Specimen: BLOOD LEFT ARM  Result Value Ref Range Status   Specimen Description BLOOD LEFT ARM  Final   Special Requests   Final    BOTTLES DRAWN AEROBIC AND ANAEROBIC Blood Culture results may not be optimal due to an inadequate volume of blood received in culture bottles   Culture   Final    NO GROWTH 4 DAYS Performed at [facility], [address], [place], [place] [address]    Report Status PENDING  Incomplete  Culture, blood (Routine X 2) w Reflex to ID Panel     Status: None (Preliminary result)   Collection Time: 11/12/23 11:25 AM   Specimen: BLOOD  LEFT HAND  Result Value Ref Range Status   Specimen Description BLOOD LEFT HAND  Final   Special Requests   Final    BOTTLES DRAWN AEROBIC AND ANAEROBIC Blood Culture results may not be optimal due to an inadequate volume of blood received in culture bottles   Culture   Final    NO GROWTH 4 DAYS Performed at [facility], [address], [place], [place] [address]    Report Status PENDING  Incomplete   Pertinent Lab.    Latest Ref Rng & Units 11/14/2023    2:52 AM 11/13/2023    3:00 AM 11/12/2023    5:21 AM  CBC  WBC 4.0 - 10.5 K/uL 7.3  12.0  12.0   Hemoglobin 13.0 - 17.0 g/dL 91.4  78.2  95.6   Hematocrit 39.0 - 52.0 % 37.5  37.4  37.1   Platelets 150 - 400 K/uL 216  187  174       Latest Ref Rng & Units 11/14/2023    2:52 AM 11/13/2023    3:00 AM 11/12/2023    5:21 AM  CMP  Glucose 70 - 99 mg/dL 213  086  578   BUN 8 - 23 mg/dL 25  32  24   Creatinine 0.61 - 1.24 mg/dL 4.69  6.29  5.28   Sodium 135 - 145 mmol/L 137  137  133   Potassium 3.5 - 5.1 mmol/L 4.0  4.3  3.9   Chloride 98 - 111 mmol/L 111  108  108   CO2 22 - 32 mmol/L 22  21  22    Calcium 8.9 - 10.3 mg/dL 8.6  8.9  8.5      Pertinent Imaging today Plain films and CT images have been personally visualized and interpreted; radiology reports have been reviewed. Decision making incorporated into the Impression   ECHO TEE Result Date: 11/16/2023    TRANSESOPHOGEAL ECHO REPORT   Patient Name:   Anthony Case Date of Exam: 11/16/2023 Medical Rec #:  [id number]          Height:       71.0 in Accession #:    [id number]  Weight:       248.2 lb Date of Birth:  Jan 07, 1951          BSA:          2.311 m Patient Age:    35 years           BP:           111/74 mmHg Patient Gender: M                  HR:           65 bpm. Exam Location:  Inpatient Procedure: Transesophageal Echo, Cardiac Doppler and Color Doppler (Both            Spectral and Color Flow Doppler were utilized during procedure). Indications:      I50.40* Unspecified combined systolic (congestive) and                  diastolic (congestive) heart failure. Bacteremia.; I35.0                  Nonrheumatic aortic (valve) stenosis  History:         Patient has prior history of Echocardiogram examinations, most                  recent 11/13/2023. Cardiomyopathy, Abnormal ECG, Aortic Valve                  Disease, Arrythmias:LBBB, Signs/Symptoms:Bacteremia and Edema;                  Risk Factors:Dyslipidemia, Diabetes and Sleep Apnea. Pulmonary                  embolus. Aortic stenosis.  Sonographer:     Sheralyn Boatman RDCS Referring Phys:  [id number] HAO MENG Diagnosing Phys: Thurmon Fair MD PROCEDURE: After discussion of the risks and benefits of a TEE, an informed consent was obtained from the patient. The transesophogeal probe was passed without difficulty through the esophogus of the patient. Imaged were obtained with the patient in a supine position. Sedation performed by different physician. The patient was monitored while under deep sedation. Anesthestetic sedation was provided intravenously by Anesthesiology: 150mg  of Propofol, 40mg  of Lidocaine. The patient's vital signs; including heart rate, blood pressure, and oxygen saturation; remained stable throughout the procedure. The patient developed no complications during the procedure.  IMPRESSIONS  1. Left ventricular ejection fraction, by estimation, is 40 to 45%. The left ventricle has mildly decreased function. The left ventricle demonstrates global hypokinesis. Left ventricular diastolic parameters are consistent with Grade I diastolic dysfunction (impaired relaxation).  2. Right ventricular systolic function is low normal. The right ventricular size is normal.  3. Left atrial size was mildly dilated. No left atrial/left atrial appendage thrombus was detected. The LAA emptying velocity was 80 cm/s.  4. The mitral valve is degenerative. Mild to moderate mitral valve regurgitation. No evidence of mitral  stenosis.  5. The aortic valve is tricuspid. There is moderate calcification of the aortic valve. There is moderate thickening of the aortic valve. Aortic valve regurgitation is not visualized. Mild to moderate aortic valve stenosis.  6. There is mild dilatation of the ascending aorta, measuring 39 mm. There is Moderate (Grade III) protruding plaque involving the descending aorta. Conclusion(s)/Recommendation(s): No evidence of vegetation/infective endocarditis on this transesophageael echocardiogram. FINDINGS  Left Ventricle: Left ventricular ejection fraction, by estimation, is 40 to 45%. The left ventricle has mildly decreased function. The left ventricle demonstrates global  hypokinesis. The left ventricular internal cavity size was normal in size. There is  no left ventricular hypertrophy. Abnormal (paradoxical) septal motion, consistent with left bundle branch block. Left ventricular diastolic parameters are consistent with Grade I diastolic dysfunction (impaired relaxation). Right Ventricle: The right ventricular size is normal. No increase in right ventricular wall thickness. Right ventricular systolic function is low normal. Left Atrium: Left atrial size was mildly dilated. No left atrial/left atrial appendage thrombus was detected. The LAA emptying velocity was 80 cm/s. Right Atrium: Right atrial size was normal in size. Pericardium: There is no evidence of pericardial effusion. Mitral Valve: The mitral valve is degenerative in appearance. Mild to moderate mitral valve regurgitation, with centrally-directed jet. No evidence of mitral valve stenosis. Tricuspid Valve: The tricuspid valve is normal in structure. Tricuspid valve regurgitation is trivial. No evidence of tricuspid stenosis. Aortic Valve: The aortic valve is tricuspid. There is moderate calcification of the aortic valve. There is moderate thickening of the aortic valve. Aortic valve regurgitation is not visualized. Mild to moderate aortic stenosis  is present. Aortic valve mean gradient measures 13.4 mmHg. Aortic valve peak gradient measures 21.9 mmHg. Aortic valve area, by VTI measures 1.35 cm. Pulmonic Valve: The pulmonic valve was normal in structure. Pulmonic valve regurgitation is not visualized. No evidence of pulmonic stenosis. Aorta: The aortic root is normal in size and structure. There is mild dilatation of the ascending aorta, measuring 39 mm. There is moderate (Grade III) protruding plaque involving the descending aorta. IAS/Shunts: No atrial level shunt detected by color flow Doppler. Additional Comments: Spectral Doppler performed. LEFT VENTRICLE PLAX 2D LVOT diam:     2.30 cm LV SV:         74 LV SV Index:   32 LVOT Area:     4.15 cm  AORTIC VALVE AV Area (Vmax):    1.97 cm AV Area (Vmean):   1.66 cm AV Area (VTI):     1.35 cm AV Vmax:           233.96 cm/s AV Vmean:          176.889 cm/s AV VTI:            0.551 m AV Peak Grad:      21.9 mmHg AV Mean Grad:      13.4 mmHg LVOT Vmax:         111.17 cm/s LVOT Vmean:        70.615 cm/s LVOT VTI:          0.179 m LVOT/AV VTI ratio: 0.32  SHUNTS Systemic VTI:  0.18 m Systemic Diam: 2.30 cm Thurmon Fair MD Electronically signed by Thurmon Fair MD Signature Date/Time: 11/16/2023/1:18:58 PM    Final    EP STUDY Result Date: 11/16/2023 See surgical note for result.  ECHOCARDIOGRAM COMPLETE Result Date: 11/13/2023    ECHOCARDIOGRAM REPORT   Patient Name:   Anthony Case Date of Exam: 11/13/2023 Medical Rec #:  [id number]          Height:       71.0 in Accession #:    [id number]         Weight:       257.6 lb Date of Birth:  [birth year]          BSA:          2.348 m Patient Age:    72 years           BP:  154/71 mmHg Patient Gender: M                  HR:           62 bpm. Exam Location:  Inpatient Procedure: 2D Echo, Color Doppler, Cardiac Doppler and Intracardiac            Opacification Agent (Both Spectral and Color Flow Doppler were            utilized during procedure).  Indications:    Endocarditis I38  History:        Patient has prior history of Echocardiogram examinations, most                 recent 08/11/2023.  Sonographer:    Harriette Bouillon RDCS Referring Phys: [facility]  Sonographer Comments: Technically difficult study due to poor echo windows. IMPRESSIONS  1. Technically difficult study with reduced echo windows  2. Left ventricular ejection fraction, by estimation, is 40 to 45%. Left ventricular ejection fraction by 2D MOD biplane is 41.2 %. The left ventricle has mildly decreased function. The left ventricle demonstrates global hypokinesis. There is mild left ventricular hypertrophy. Left ventricular diastolic parameters are consistent with Grade I diastolic dysfunction (impaired relaxation).  3. Right ventricular systolic function is normal. The right ventricular size is normal. Tricuspid regurgitation signal is inadequate for assessing PA pressure.  4. The mitral valve is grossly normal. No evidence of mitral valve regurgitation.  5. The aortic valve is calcified. There is moderate calcification of the aortic valve. Aortic valve regurgitation is not visualized. Moderate aortic valve stenosis. Aortic valve area, by VTI measures 1.40 cm. Aortic valve mean gradient measures 10.0 mmHg. Aortic valve Vmax measures 1.88 m/s.  6. Aortic dilatation noted. There is borderline dilatation of the ascending aorta, measuring 39 mm. Comparison(s): No significant change from prior study. 08/11/2023: LVEF 40-45%. Conclusion(s)/Recommendation(s): No evidence of valvular vegetations on this transthoracic echocardiogram. Consider a transesophageal echocardiogram to exclude infective endocarditis if clinically indicated. FINDINGS  Left Ventricle: Left ventricular ejection fraction, by estimation, is 40 to 45%. Left ventricular ejection fraction by 2D MOD biplane is 41.2 %. The left ventricle has mildly decreased function. The left ventricle demonstrates global hypokinesis. Definity  contrast agent was given IV to delineate the left ventricular endocardial borders. The left ventricular internal cavity size was normal in size. There is mild left ventricular hypertrophy. Abnormal (paradoxical) septal motion, consistent with left bundle branch block. Left ventricular diastolic parameters are consistent with Grade I diastolic dysfunction (impaired relaxation). Indeterminate filling pressures. Right Ventricle: The right ventricular size is normal. No increase in right ventricular wall thickness. Right ventricular systolic function is normal. Tricuspid regurgitation signal is inadequate for assessing PA pressure. Left Atrium: Left atrial size was normal in size. Right Atrium: Right atrial size was normal in size. Pericardium: There is no evidence of pericardial effusion. Mitral Valve: The mitral valve is grossly normal. No evidence of mitral valve regurgitation. Tricuspid Valve: The tricuspid valve is not well visualized. Tricuspid valve regurgitation is trivial. Aortic Valve: The aortic valve is calcified. There is moderate calcification of the aortic valve. Aortic valve regurgitation is not visualized. Moderate aortic stenosis is present. Aortic valve mean gradient measures 10.0 mmHg. Aortic valve peak gradient  measures 14.1 mmHg. Aortic valve area, by VTI measures 1.40 cm. Pulmonic Valve: The pulmonic valve was not well visualized. Pulmonic valve regurgitation is not visualized. Aorta: Aortic dilatation noted. There is borderline dilatation of the ascending aorta, measuring 39 mm. Venous:  The inferior vena cava was not well visualized. IAS/Shunts: No atrial level shunt detected by color flow Doppler.  LEFT VENTRICLE PLAX 2D                        Biplane EF (MOD) LVIDd:         5.70 cm         LV Biplane EF:   Left LVIDs:         3.40 cm                          ventricular LV PW:         1.20 cm                          ejection LV IVS:        1.20 cm                          fraction by LVOT  diam:     2.10 cm                          2D MOD LV SV:         82                               biplane is LV SV Index:   35                               41.2 %. LVOT Area:     3.46 cm                                Diastology                                LV e' medial:    6.31 cm/s LV Volumes (MOD)               LV E/e' medial:  13.7 LV vol d, MOD    134.6 ml      LV e' lateral:   8.92 cm/s A2C:                           LV E/e' lateral: 9.7 LV vol d, MOD    178.7 ml A4C: LV vol s, MOD    83.3 ml A2C: LV vol s, MOD    102.4 ml A4C: LV SV MOD A2C:   51.3 ml LV SV MOD A4C:   178.7 ml LV SV MOD BP:    64.9 ml RIGHT VENTRICLE RV S prime:     14.80 cm/s TAPSE (M-mode): 2.5 cm LEFT ATRIUM           Index LA diam:      4.80 cm 2.04 cm/m LA Vol (A4C): 77.5 ml 33.01 ml/m  AORTIC VALVE AV Area (Vmax):    1.59 cm AV Area (Vmean):   1.40 cm AV Area (VTI):     1.40 cm AV Vmax:           188.00 cm/s AV  Vmean:          155.000 cm/s AV VTI:            0.582 m AV Peak Grad:      14.1 mmHg AV Mean Grad:      10.0 mmHg LVOT Vmax:         86.10 cm/s LVOT Vmean:        62.800 cm/s LVOT VTI:          0.236 m LVOT/AV VTI ratio: 0.41  AORTA Ao Root diam: 3.70 cm Ao Asc diam:  3.90 cm MITRAL VALVE MV Area (PHT): 2.19 cm     SHUNTS MV Decel Time: 346 msec     Systemic VTI:  0.24 m MV E velocity: 86.50 cm/s   Systemic Diam: 2.10 cm MV A velocity: 102.00 cm/s MV E/A ratio:  0.85 Zoila Shutter MD Electronically signed by Zoila Shutter MD Signature Date/Time: 11/13/2023/12:39:02 PM    Final    MR TOES RIGHT W WO CONTRAST Result Date: 11/12/2023 CLINICAL DATA:  Chronic foot wound. Bacteremia with cellulitis. Evaluate for osteomyelitis. EXAM: MRI OF THE RIGHT TOES WITHOUT AND WITH CONTRAST TECHNIQUE: Multiplanar, multisequence MR imaging of the right toes was performed both before and after administration of intravenous contrast. CONTRAST:  10mL GADAVIST GADOBUTROL 1 MMOL/ML IV SOLN COMPARISON:  Right foot radiographs 11/11/2023 and  09/27/2023. FINDINGS: Technical note: Despite efforts by the technologist and patient, mild to moderate motion artifact is present on today's exam and could not be eliminated. This reduces exam sensitivity and specificity. Bones/Joint/Cartilage Mild clawtoe deformities of the 2nd through 5th toes. No evidence of acute fracture, dislocation or osteomyelitis. 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 No significant joint effusions or abnormal synovial enhancement. Ligaments The collateral ligaments of the metatarsophalangeal joints appear intact. Muscles and Tendons Forefoot muscular atrophy without focal fluid collection or abnormal enhancement. The forefoot tendons appear intact, without significant tenosynovitis. Soft tissues Mild soft tissue swelling and enhancement within the great toe. No focal fluid collection, foreign body or soft tissue emphysema demonstrated. IMPRESSION: 1. Mild soft tissue swelling and enhancement within the great toe consistent with cellulitis. No evidence of abscess or osteomyelitis. 2. 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 Electronically Signed   By: Carey Bullocks M.D.   On: 11/12/2023 16:37   DG Ankle Complete Right Result Date: 11/12/2023 CLINICAL DATA:  Acute right ankle pain. EXAM: RIGHT ANKLE - COMPLETE 3+ VIEW COMPARISON:  Foot radiograph earlier today. FINDINGS: No fracture or dislocation. Tibial talar degenerative change with spurring. Large fragmented plantar calcaneal spur. Small Achilles tendon enthesophyte. No ankle joint effusion. Prominent vascular calcifications with mild soft tissue edema. No radiopaque foreign body or soft tissue gas. IMPRESSION: 1. No acute osseous findings of the right ankle. 2. Tibial talar degenerative change. 3. Large fragmented plantar calcaneal spur. Electronically Signed   By: Narda Rutherford M.D.   On: 11/12/2023 15:22   DG Foot  Complete Right Result Date: 11/11/2023 CLINICAL DATA:  Right great toe wound. EXAM: RIGHT FOOT COMPLETE - 3+ VIEW COMPARISON:  None Available. FINDINGS: There is no evidence of an acute fracture or dislocation. A chronic appearing deformity is seen involving the large plantar calcaneal spur. Degenerative changes are seen involving the dorsal aspect of the mid right foot and multiple interphalangeal joints. A small soft tissue ulceration is seen along the medial aspect of the right great toe. IMPRESSION: 1. Small soft tissue ulceration along the  medial aspect of the right great toe. 2. No acute osseous abnormality. 3. Degenerative changes, as described above. Electronically Signed   By: Aram Candela M.D.   On: 11/11/2023 01:44   CT ABDOMEN PELVIS W CONTRAST Result Date: 11/11/2023 CLINICAL DATA:  Chills with right foot pain that radiates up to the groin. EXAM: CT ABDOMEN AND PELVIS WITH CONTRAST TECHNIQUE: Multidetector CT imaging of the abdomen and pelvis was performed using the standard protocol following bolus administration of intravenous contrast. RADIATION DOSE REDUCTION: This exam was performed according to the departmental dose-optimization program which includes automated exposure control, adjustment of the mA and/or kV according to patient size and/or use of iterative reconstruction technique. CONTRAST:  OMNIPAQUE IOHEXOL 350 MG/ML SOLN COMPARISON:  May 23, 2021 FINDINGS: Lower chest: No acute abnormality. Hepatobiliary: No focal liver abnormality is seen. A subcentimeter low-attenuation gallstone is seen within the dependent portion of the gallbladder lumen (axial CT image 25, CT series 3), without evidence of gallbladder wall thickening or biliary dilatation. Pancreas: Unremarkable. No pancreatic ductal dilatation or surrounding inflammatory changes. Spleen: Normal in size without focal abnormality. Adrenals/Urinary Tract: Adrenal glands are unremarkable. Kidneys are normal in size.  Small parapelvic renal cysts are seen on the right. 2 mm and 3 mm nonobstructing renal calculi are noted within the right kidney. There is no evidence of obstructing renal calculi or hydronephrosis. Bladder is unremarkable. Stomach/Bowel: Stomach is within normal limits. Appendix appears normal. No evidence of bowel wall thickening, distention, or inflammatory changes. Vascular/Lymphatic: Aortic atherosclerosis. There is mild right inguinal lymphadenopathy. Reproductive: The prostate gland is moderately enlarged. Other: No abdominal wall hernia or abnormality. No abdominopelvic ascites. Musculoskeletal: Chronic, degenerative and postoperative changes are seen throughout the thoracolumbar spine. IMPRESSION: 1. Cholelithiasis. 2. Subcentimeter nonobstructing right renal calculi. 3. Mild right inguinal lymphadenopathy. 4. Moderate enlargement of the prostate gland. 5. Aortic atherosclerosis. Aortic Atherosclerosis (ICD10-I70.0). Electronically Signed   By: Aram Candela M.D.   On: 11/11/2023 00:12   DG Chest Port 1 View Result Date: 11/10/2023 CLINICAL DATA:  Questionable sepsis. EXAM: PORTABLE CHEST 1 VIEW COMPARISON:  September 27, 2023 FINDINGS: The heart size and mediastinal contours are within normal limits. There is no evidence of an acute infiltrate, pleural effusion or pneumothorax. Multilevel degenerative changes and postoperative changes are seen involving the thoracic spine. IMPRESSION: No active cardiopulmonary disease. Electronically Signed   By: Aram Candela M.D.   On: 11/10/2023 22:07   I have personally spent 51 minutes involved in face-to-face and non-face-to-face activities for this patient on the day of the visit. Professional time spent includes the following activities: Preparing to see the patient (review of tests), Obtaining and/or reviewing separately obtained history (admission/discharge record), Performing a medically appropriate examination and/or evaluation , Ordering  medications/tests/procedures, referring and communicating with other health care professionals, Documenting clinical information in the EMR, Independently interpreting results (not separately reported), Communicating results to the patient/family/caregiver, Counseling and educating the patient/family/caregiver and Care coordination (not separately reported).   Plan d/w requesting provider as well as ID pharm D  Of note, portions of this note may have been created with voice recognition software. While this note has been edited for accuracy, occasional wrong-word or 'sound-a-like' substitutions may have occurred due to the inherent limitations of voice recognition software.   Electronically signed by:   Odette Fraction, MD Infectious Disease Physician [facility] Pager: [phone]

## 2023-11-16 NOTE — Interval H&P Note (Signed)
 History and Physical Interval Note:  11/16/2023 9:20 AM  Anthony Case  has presented today for surgery, with the diagnosis of Bacteremia.  The various methods of treatment have been discussed with the patient and family. After consideration of risks, benefits and other options for treatment, the patient has consented to  Procedure(s): TRANSESOPHAGEAL ECHOCARDIOGRAM (N/A) as a surgical intervention.  The patient's history has been reviewed, patient examined, no change in status, stable for surgery.  I have reviewed the patient's chart and labs.  Questions were answered to the patient's satisfaction.     [name]

## 2023-11-17 LAB — CULTURE, BLOOD (ROUTINE X 2)
Culture: NO GROWTH
Culture: NO GROWTH

## 2023-11-19 DIAGNOSIS — Z79899 Other long term (current) drug therapy: Secondary | ICD-10-CM | POA: Diagnosis not present

## 2023-11-19 DIAGNOSIS — I5020 Unspecified systolic (congestive) heart failure: Secondary | ICD-10-CM | POA: Diagnosis not present

## 2023-11-19 DIAGNOSIS — E669 Obesity, unspecified: Secondary | ICD-10-CM | POA: Diagnosis not present

## 2023-11-19 DIAGNOSIS — L4050 Arthropathic psoriasis, unspecified: Secondary | ICD-10-CM | POA: Diagnosis not present

## 2023-11-19 DIAGNOSIS — R7989 Other specified abnormal findings of blood chemistry: Secondary | ICD-10-CM | POA: Diagnosis not present

## 2023-11-19 DIAGNOSIS — L409 Psoriasis, unspecified: Secondary | ICD-10-CM | POA: Diagnosis not present

## 2023-11-19 DIAGNOSIS — Z6833 Body mass index (BMI) 33.0-33.9, adult: Secondary | ICD-10-CM | POA: Diagnosis not present

## 2023-11-19 DIAGNOSIS — S22080A Wedge compression fracture of T11-T12 vertebra, initial encounter for closed fracture: Secondary | ICD-10-CM | POA: Diagnosis not present

## 2023-11-19 DIAGNOSIS — M1991 Primary osteoarthritis, unspecified site: Secondary | ICD-10-CM | POA: Diagnosis not present

## 2023-11-20 ENCOUNTER — Other Ambulatory Visit: Payer: Self-pay

## 2023-11-20 DIAGNOSIS — I739 Peripheral vascular disease, unspecified: Secondary | ICD-10-CM

## 2023-11-23 DIAGNOSIS — K8020 Calculus of gallbladder without cholecystitis without obstruction: Secondary | ICD-10-CM | POA: Diagnosis not present

## 2023-11-23 DIAGNOSIS — Z8619 Personal history of other infectious and parasitic diseases: Secondary | ICD-10-CM | POA: Diagnosis not present

## 2023-11-23 DIAGNOSIS — Z7901 Long term (current) use of anticoagulants: Secondary | ICD-10-CM | POA: Diagnosis not present

## 2023-11-23 DIAGNOSIS — E11621 Type 2 diabetes mellitus with foot ulcer: Secondary | ICD-10-CM | POA: Diagnosis not present

## 2023-11-23 DIAGNOSIS — R8271 Bacteriuria: Secondary | ICD-10-CM | POA: Diagnosis not present

## 2023-11-23 DIAGNOSIS — E1129 Type 2 diabetes mellitus with other diabetic kidney complication: Secondary | ICD-10-CM | POA: Diagnosis not present

## 2023-11-23 DIAGNOSIS — L97511 Non-pressure chronic ulcer of other part of right foot limited to breakdown of skin: Secondary | ICD-10-CM | POA: Diagnosis not present

## 2023-11-23 DIAGNOSIS — Z794 Long term (current) use of insulin: Secondary | ICD-10-CM | POA: Diagnosis not present

## 2023-11-23 DIAGNOSIS — I110 Hypertensive heart disease with heart failure: Secondary | ICD-10-CM | POA: Diagnosis not present

## 2023-11-23 DIAGNOSIS — E1151 Type 2 diabetes mellitus with diabetic peripheral angiopathy without gangrene: Secondary | ICD-10-CM | POA: Diagnosis not present

## 2023-11-23 DIAGNOSIS — I5022 Chronic systolic (congestive) heart failure: Secondary | ICD-10-CM | POA: Diagnosis not present

## 2023-11-23 DIAGNOSIS — Z86711 Personal history of pulmonary embolism: Secondary | ICD-10-CM | POA: Diagnosis not present

## 2023-11-23 DIAGNOSIS — R2681 Unsteadiness on feet: Secondary | ICD-10-CM | POA: Diagnosis not present

## 2023-11-26 DIAGNOSIS — E1129 Type 2 diabetes mellitus with other diabetic kidney complication: Secondary | ICD-10-CM | POA: Diagnosis not present

## 2023-11-26 DIAGNOSIS — E1140 Type 2 diabetes mellitus with diabetic neuropathy, unspecified: Secondary | ICD-10-CM | POA: Diagnosis not present

## 2023-11-26 DIAGNOSIS — R82998 Other abnormal findings in urine: Secondary | ICD-10-CM | POA: Diagnosis not present

## 2023-11-26 DIAGNOSIS — E1151 Type 2 diabetes mellitus with diabetic peripheral angiopathy without gangrene: Secondary | ICD-10-CM | POA: Diagnosis not present

## 2023-11-30 ENCOUNTER — Encounter: Payer: Self-pay | Admitting: Podiatry

## 2023-11-30 ENCOUNTER — Ambulatory Visit (INDEPENDENT_AMBULATORY_CARE_PROVIDER_SITE_OTHER): Payer: Medicare Other | Admitting: Podiatry

## 2023-11-30 DIAGNOSIS — L089 Local infection of the skin and subcutaneous tissue, unspecified: Secondary | ICD-10-CM

## 2023-11-30 DIAGNOSIS — M79675 Pain in left toe(s): Secondary | ICD-10-CM | POA: Diagnosis not present

## 2023-11-30 DIAGNOSIS — B351 Tinea unguium: Secondary | ICD-10-CM

## 2023-11-30 DIAGNOSIS — E11628 Type 2 diabetes mellitus with other skin complications: Secondary | ICD-10-CM | POA: Diagnosis not present

## 2023-11-30 NOTE — Progress Notes (Signed)
This patient returns to my office for at risk foot care.  This patient requires this care by a professional since this patient will be at risk due to having diabetic neuropathy and history of DVT.   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 This patient is unable to cut nails himself since the patient cannot reach his nails.These nails are painful walking and wearing shoes. This patient presents for at risk foot care today.  General Appearance  Alert, conversant and in no acute stress.  Vascular  Dorsalis pedis and posterior tibial  pulses are palpable  bilaterally.  Capillary return is within normal limits  bilaterally. Temperature is within normal limits  bilaterally.  Neurologic  Senn-Weinstein monofilament wire test within normal limits  Left foot.  LOPS absent rght foot.. Muscle power within normal limits bilaterally.  Nails Thick disfigured discolored nails with subungual debris hallux toenails. No evidence of bacterial infection or drainage bilaterally.  Orthopedic  No limitations of motion  feet .  No crepitus or effusions noted.  No bony pathology or digital deformities noted.  HAV  B/L.  DJD right midfoot.  Skin  normotropic skin with no porokeratosis noted bilaterally.  No signs of infections .  Healed ulcer left hallux.  Asymptomatic callus sub 5th left foot.  Onychomycosis  Pain in right toes  Pain in left toes  Consent was obtained for treatment procedures.   Mechanical debridement of nails  hallux  left. performed with a nail nipper.  Filed with dremel without incident.    Return office visit   6 months .                 Told patient to return for periodic foot care and evaluation due to potential at risk complications.   Helane Gunther DPM

## 2023-12-01 ENCOUNTER — Encounter: Payer: Self-pay | Admitting: Podiatry

## 2023-12-01 ENCOUNTER — Encounter: Payer: Self-pay | Admitting: Cardiology

## 2023-12-01 ENCOUNTER — Ambulatory Visit (INDEPENDENT_AMBULATORY_CARE_PROVIDER_SITE_OTHER): Admitting: Podiatry

## 2023-12-01 ENCOUNTER — Ambulatory Visit: Attending: Cardiology | Admitting: Cardiology

## 2023-12-01 VITALS — BP 102/60 | HR 73 | Resp 16 | Ht 71.0 in | Wt 254.0 lb

## 2023-12-01 DIAGNOSIS — I110 Hypertensive heart disease with heart failure: Secondary | ICD-10-CM | POA: Diagnosis not present

## 2023-12-01 DIAGNOSIS — I10 Essential (primary) hypertension: Secondary | ICD-10-CM

## 2023-12-01 DIAGNOSIS — E785 Hyperlipidemia, unspecified: Secondary | ICD-10-CM | POA: Diagnosis not present

## 2023-12-01 DIAGNOSIS — I447 Left bundle-branch block, unspecified: Secondary | ICD-10-CM | POA: Diagnosis not present

## 2023-12-01 DIAGNOSIS — I5022 Chronic systolic (congestive) heart failure: Secondary | ICD-10-CM

## 2023-12-01 DIAGNOSIS — I428 Other cardiomyopathies: Secondary | ICD-10-CM

## 2023-12-01 DIAGNOSIS — Z86711 Personal history of pulmonary embolism: Secondary | ICD-10-CM | POA: Diagnosis not present

## 2023-12-01 DIAGNOSIS — E11621 Type 2 diabetes mellitus with foot ulcer: Secondary | ICD-10-CM | POA: Diagnosis not present

## 2023-12-01 DIAGNOSIS — Z7901 Long term (current) use of anticoagulants: Secondary | ICD-10-CM | POA: Diagnosis not present

## 2023-12-01 DIAGNOSIS — L97512 Non-pressure chronic ulcer of other part of right foot with fat layer exposed: Secondary | ICD-10-CM | POA: Diagnosis not present

## 2023-12-01 DIAGNOSIS — Z1331 Encounter for screening for depression: Secondary | ICD-10-CM | POA: Diagnosis not present

## 2023-12-01 DIAGNOSIS — G4733 Obstructive sleep apnea (adult) (pediatric): Secondary | ICD-10-CM | POA: Diagnosis not present

## 2023-12-01 DIAGNOSIS — E1151 Type 2 diabetes mellitus with diabetic peripheral angiopathy without gangrene: Secondary | ICD-10-CM | POA: Diagnosis not present

## 2023-12-01 DIAGNOSIS — M79605 Pain in left leg: Secondary | ICD-10-CM | POA: Diagnosis not present

## 2023-12-01 DIAGNOSIS — Q231 Congenital insufficiency of aortic valve: Secondary | ICD-10-CM | POA: Diagnosis not present

## 2023-12-01 DIAGNOSIS — I739 Peripheral vascular disease, unspecified: Secondary | ICD-10-CM | POA: Diagnosis not present

## 2023-12-01 DIAGNOSIS — Z0000 Encounter for general adult medical examination without abnormal findings: Secondary | ICD-10-CM | POA: Diagnosis not present

## 2023-12-01 DIAGNOSIS — Z1339 Encounter for screening examination for other mental health and behavioral disorders: Secondary | ICD-10-CM | POA: Diagnosis not present

## 2023-12-01 NOTE — Progress Notes (Signed)
 Cardiology Office Note:  .   Date:  12/01/2023  ID:  Anthony Case, DOB 02/24/1951, MRN [id number] PCP: Garlan Fillers, MD   HeartCare Providers Cardiologist:  Yates Decamp, MD   History of Present Illness: .   Anthony Case is a 73 y.o. 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   Patient was admitted on 11/10/2023 with sepsis and developed diabetic foot ulcer.  He had group G Streptococcus bacteremia from diabetic foot.  TEE did not reveal any endocarditis.  Eventually recovered and discharged home.  Discussed the use of AI scribe software for clinical note transcription with the patient, who gave verbal consent to proceed.  The patient, with a history of diabetes and heart failure, presents after a recent hospitalization for sepsis secondary to a poorly managed foot blister. The blister developed during a camping trip in [place]. The patient returned home due to unexpected family circumstances and noticed his foot and leg were 'beet red and swollen.' He was hospitalized and treated with IV antibiotics, which seemed to clear the infection. He has recuperated well and presently asymptomatic.  Labs   Lab Results  Component Value Date   NA 137 11/14/2023   K 4.0 11/14/2023   CO2 22 11/14/2023   GLUCOSE 132 (H) 11/14/2023   BUN 25 (H) 11/14/2023   CREATININE 0.97 11/14/2023   CALCIUM 8.6 (L) 11/14/2023   GFRNONAA >60 11/14/2023      Latest Ref Rng & Units 11/14/2023    2:52 AM 11/13/2023    3:00 AM 11/12/2023    5:21 AM  BMP  Glucose 70 - 99 mg/dL 086  578  469    BUN 8 - 23 mg/dL 25  32  24   Creatinine 0.61 - 1.24 mg/dL 6.29  5.28  4.13   Sodium 135 - 145 mmol/L 137  137  133   Potassium 3.5 - 5.1 mmol/L 4.0  4.3  3.9   Chloride 98 - 111 mmol/L 111  108  108   CO2 22 - 32 mmol/L 22  21  22    Calcium 8.9 - 10.3 mg/dL 8.6  8.9  8.5       Latest Ref Rng & Units 11/14/2023    2:52 AM 11/13/2023    3:00 AM 11/12/2023    5:21 AM  CBC  WBC 4.0 - 10.5 K/uL 7.3  12.0  12.0   Hemoglobin 13.0 - 17.0 g/dL 24.4  01.0  27.2   Hematocrit 39.0 - 52.0 % 37.5  37.4  37.1   Platelets 150 - 400 K/uL 216  187  174    Lab Results  Component Value Date   HGBA1C 6.0 (H) 09/27/2023    No results found for: "TSH"   Review of Systems  Cardiovascular:  Negative for chest pain, dyspnea on exertion and leg swelling.   Physical Exam:   VS:  BP 102/60 (BP Location: Left Arm, Patient Position: Sitting, Cuff Size: Normal)   Pulse 73   Resp 16   Ht 5\' 11"  (1.803 m)   Wt 254 lb (115.2 kg)   BMI 35.43 kg/m    Wt Readings from  Last 3 Encounters:  12/01/23 254 lb (115.2 kg)  11/16/23 248 lb 3.2 oz (112.6 kg)  11/03/23 259 lb (117.5 kg)     Physical Exam Neck:     Vascular: No carotid bruit or JVD.  Cardiovascular:     Rate and Rhythm: Normal rate and regular rhythm.     Pulses: Intact distal pulses.          Dorsalis pedis pulses are 1+ on the right side and 1+ on the left side.       Posterior tibial pulses are 1+ on the right side and 1+ on the left side.     Heart sounds: Normal heart sounds. No murmur heard.    No gallop.  Pulmonary:     Effort: Pulmonary effort is normal.     Breath sounds: Normal breath sounds.  Abdominal:     General: Bowel sounds are normal.     Palpations: Abdomen is soft.  Musculoskeletal:     Right lower leg: No edema.     Left lower leg: No edema.    Studies Reviewed: Marland Kitchen    ECHOCARDIOGRAM COMPLETE 11/13/2023  1. Technically difficult study with reduced echo windows 2. Left ventricular ejection fraction, by estimation, is  40 to 45%. Left ventricular ejection fraction by 2D MOD biplane is 41.2 %. The left ventricle has mildly decreased function. The left ventricle demonstrates global hypokinesis. There is mild left ventricular hypertrophy. Left ventricular diastolic parameters are consistent with Grade I diastolic dysfunction (impaired relaxation). 3. Right ventricular systolic function is normal. The right ventricular size is normal. Tricuspid regurgitation signal is inadequate for assessing PA pressure. 4. The mitral valve is grossly normal. No evidence of mitral valve regurgitation. 5. The aortic valve is calcified. There is moderate calcification of the aortic valve. Aortic valve regurgitation is not visualized. Moderate aortic valve stenosis. Aortic valve area, by VTI measures 1.40 cm. Aortic valve mean gradient measures 10.0 mmHg. Aortic valve Vmax measures 1.88 m/s. 6. Aortic dilatation noted. There is borderline dilatation of the ascending aorta, measuring 39 mm.   Comparison(s): No significant change from prior study. 08/11/2023: LVEF 40-45%. No significant change from 08/27/2022.  ABI 09/29/2023: Right ABI noncompressible, TBI 55 is abnormal. Left ABI 1.47, TBI 0.62, abnormal. Normal triphasic waveforms at the level of the ankle bilaterally.  EKG:    EKG Interpretation Date/Time:  Tuesday December 01 2023 11:26:13 EDT Ventricular Rate:  73 PR Interval:  264 QRS Duration:  160 QT Interval:  444 QTC Calculation: 489 R Axis:   -18  Text Interpretation: EKG 12/01/2023: Sinus rhythm with first-degree block at the rate of 73 bpm, left bundle branch block.  No further analysis.  Compared to 11/10/2023, probable atypical atrial flutter with 2: 1 conduction with ventricular rate of 112 bpm no longer present. Compared to 09/27/2023, no change. Confirmed by Delrae Rend [phone]) on 12/01/2023 11:37:42 AM    Medications and allergies    Allergies  Allergen Reactions   Ace Inhibitors Other (See Comments)    Lisinopril Cough     Current Outpatient Medications:    acetaminophen (TYLENOL) 500 MG tablet, Take 1,000 mg by mouth every 6 (six) hours as needed for moderate pain., Disp: , Rfl:    Ascorbic Acid (VITAMIN C) 1000 MG tablet, Take 1,000 mg by mouth daily., Disp: , Rfl:    atorvastatin (LIPITOR) 20 MG tablet, Take 20 mg by mouth daily., Disp: , Rfl:    augmented betamethasone dipropionate (DIPROLENE-AF) 0.05 % cream, Apply 1  Application topically as needed., Disp: , Rfl:    cholecalciferol (VITAMIN D3) 25 MCG (1000 UNIT) tablet, Take 1,000 Units by mouth daily., Disp: , Rfl:    dapagliflozin propanediol (FARXIGA) 10 MG TABS tablet, Take 10 mg by mouth daily., Disp: , Rfl:    ezetimibe (ZETIA) 10 MG tablet, Take 1 tablet (10 mg total) by mouth daily., Disp: 90 tablet, Rfl: 3   folic acid (FOLVITE) 1 MG tablet, Take 1 mg by mouth daily., Disp: , Rfl:    gabapentin (NEURONTIN) 100 MG capsule, Take 100 mg by mouth 3 (three) times daily., Disp: , Rfl:    glipiZIDE (GLUCOTROL XL) 2.5 MG 24 hr tablet, Take 5 mg by mouth daily with breakfast., Disp: , Rfl:    Insulin Degludec (TRESIBA FLEXTOUCH [place]), Inject 48 Units into the skin daily., Disp: , Rfl:    metFORMIN (GLUCOPHAGE) 500 MG tablet, Take 500 mg by mouth 2 (two) times daily with a meal., Disp: , Rfl:    metoprolol succinate (TOPROL-XL) 50 MG 24 hr tablet, Take 1 tablet (50 mg total) by mouth daily., Disp: 90 tablet, Rfl: 1   Multiple Vitamin (MULTIVITAMIN WITH MINERALS) TABS tablet, Take 1 tablet by mouth daily., Disp: , Rfl:    Omega-3 Fatty Acids (FISH OIL) 1000 MG CAPS, Take 1,000 mg by mouth daily., Disp: , Rfl:    OZEMPIC, 0.25 OR 0.5 MG/DOSE, 2 MG/3ML SOPN, SMARTSIG:0.25 Milligram(s) SUB-Q Once a Week, Disp: , Rfl:    rivaroxaban (XARELTO) 20 MG TABS tablet, Take 1 tablet (20 mg total) by mouth daily. 08/04/13 last dose of xarelto in preparation for colonoscopy, Disp: 30 tablet, Rfl:    sacubitril-valsartan (ENTRESTO) 97-103 MG, Take 1  tablet by mouth 2 (two) times daily., Disp: 180 tablet, Rfl: 3   Apremilast (OTEZLA PO), Take by mouth. (Patient not taking: Reported on 12/01/2023), Disp: , Rfl:    No orders of the defined types were placed in this encounter.    There are no discontinued medications.   ASSESSMENT AND PLAN: .      ICD-10-CM   1. Chronic heart failure with mildly reduced ejection fraction (HFmrEF, 41-49%) (HCC)  I50.22     2. Non-ischemic cardiomyopathy (HCC)  I42.8     3. Essential hypertension  I10     4. LBBB (left bundle branch block)  I44.7 EKG 12-Lead      1. Chronic heart failure with mildly reduced ejection fraction (HFmrEF, 41-49%) (HCC) Heart function is well-managed with an EF of 40-45% despite a recent infection. He is on metoprolol, Entresto, and Farxiga, with well-controlled blood pressure and slight weight reduction. Continue metoprolol, Entresto, and Farxiga.   2. Non-ischemic cardiomyopathy (HCC) No clinical e/o CHF.  3. Essential hypertension Blood pressure is well-controlled on above medical therapy as well, today's blood pressure was 112/60 mmHg.  4. LBBB (left bundle branch block) Chronic left bundle branch block, however LVEF is improved and is no indication for BiV ICD/pacemaker.  He is on guideline directed medical therapy with maximum tolerated dose of Entresto, metoprolol succinate and Farxiga.  Recent hospitalization records when he presented with infected right toe after he ignored a blister while he was on a camping trip to [place], led to hospitalization and sepsis which has now cleared.  ABI reviewed, he has small vessel disease but triphasic waveforms and noncompressible vessels, do not think he has significant PAD beyond this.  The patient plans to embark on a 90-day cross-country trip and has concerns about his balance  due to neuropathy. He uses trekking poles for support. He is also awaiting a follow-up appointment in December.  I do not see any  contraindication for this.  I will see him back in 6 months.  Signed,  Yates Decamp, MD, [facility] 12/01/2023, 11:44 AM [facility] [address] [place], [place] [address] Phone: [phone]. Fax:  [phone]

## 2023-12-01 NOTE — Patient Instructions (Signed)
 Medication Instructions:  Your physician recommends that you continue on your current medications as directed. Please refer to the Current Medication list given to you today.  *If you need a refill on your cardiac medications before your next appointment, please call your pharmacy*  Lab Work: none If you have labs (blood work) drawn today and your tests are completely normal, you will receive your results only by: MyChart Message (if you have MyChart) OR A paper copy in the mail If you have any lab test that is abnormal or we need to change your treatment, we will call you to review the results.  Testing/Procedures: none  Follow-Up: At [facility], you and your health needs are our priority.  As part of our continuing mission to provide you with exceptional heart care, our providers are all part of one team.  This team includes your primary Cardiologist (physician) and Advanced Practice Providers or APPs (Physician Assistants and Nurse Practitioners) who all work together to provide you with the care you need, when you need it.  Your next appointment:   6 month(s)  Provider:   Yates Decamp, MD     We recommend signing up for the patient portal called "MyChart".  Sign up information is provided on this After Visit Summary.  MyChart is used to connect with patients for Virtual Visits (Telemedicine).  Patients are able to view lab/test results, encounter notes, upcoming appointments, etc.  Non-urgent messages can be sent to your provider as well.   To learn more about what you can do with MyChart, go to [email or web address].   Other Instructions        1st Floor: - Lobby - Registration  - Pharmacy  - Lab - Cafe  2nd Floor: - PV Lab - Diagnostic Testing (echo, CT, nuclear med)  3rd Floor: - Vacant  4th Floor: - TCTS (cardiothoracic surgery) - AFib Clinic - Structural Heart Clinic - Vascular Surgery  - Vascular Ultrasound  5th Floor: - HeartCare Cardiology  (general and EP) - Clinical Pharmacy for coumadin, hypertension, lipid, weight-loss medications, and med management appointments    Valet parking services will be available as well.

## 2023-12-02 DIAGNOSIS — R260 Ataxic gait: Secondary | ICD-10-CM | POA: Diagnosis not present

## 2023-12-02 NOTE — Progress Notes (Unsigned)
 HISTORY AND PHYSICAL     CC:  follow up. Requesting Provider:  Garlan Fillers, MD  HPI: This is a 73 y.o. male who is here today for follow up for PAD.  Pt was originally seen on 2/20/205 by Dr. Karin Lieu at the  request of Dr. Sharl Ma, D.P.M., for evaluation of right foot diabetic ulcer with falsely elevated ABIs.   At that time, his wound was healing and Dr. Lilian Kapur was seeing him every two weeks.  He did have some claudication but could ambulate about 0.5 mile depending on terrain.  He did not have rest pain.  He scheduled him for 3 month follow up and if wound continuing to heal, we would continue conservative therapy.  The pt returns today for follow up.  He denies any pain in his feet at night or claudication symptoms.  The wound on his right great toe is improving slowly.  He states that he does have neuropathy in his feet but he does have some feeling in his feet.  He has some pain in the right lateral ankle when laying on his side.  He did have an xray of his right ankle last month and no acute findings of the right ankle.   He has a new pair of tennis shoes that he is wearing that protects his feet.  He states that the factor that limits his walking is mainly his ankylosing spondylitis.  He also has some balance issues.  He does take narcotic pain medication when he knows he will be walking for a longer distance.   He states that after he saw Dr. Karin Lieu in February, he developed a staph cellulitis in the lower extremity.  After that, he was admitted to the hospital for sepsis.  He did have a TEE and did not have any vegetations.    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   He is leaving in the near future for 3 months to go on a national park tour.    The pt is on a statin for cholesterol management.    The pt is not on an aspirin.    Other AC:  Xarelto The pt is on BB, ARB for hypertension.  The pt is  on medication for diabetes. Tobacco hx:  never  Pt does not have  family hx of AAA.  Past Medical History:  Diagnosis Date   Arthritis    Diabetes mellitus    Hx pulmonary embolism 08/26/2007   required emergent tPA treatment   Hypertension    Psoriatic arthritis (HCC) 09/27/2023   Shortness of breath    Sleep apnea     Past Surgical History:  Procedure Laterality Date   BACK SURGERY     COLONOSCOPY  2009   adenoma polyp   CYSTOSCOPY/URETEROSCOPY/HOLMIUM LASER/STENT PLACEMENT Left 07/02/2021   Procedure: CYSTOSCOPY/RETROGRADE/URETEROSCOPY/HOLMIUM LASER/STENT PLACEMENT;  Surgeon: Jerilee Field, MD;  Location: [facility];  Service: Urology;  Laterality: Left;   ELBOW ARTHROSCOPY  2010   IR RADIOLOGIST EVAL & MGMT  12/25/2020   IR RADIOLOGIST EVAL & MGMT  12/28/2020   LUMBAR PERCUTANEOUS PEDICLE SCREW 3 LEVEL N/A 05/25/2013   Procedure: Posterior Percutaneous Fixation from Thoracic nine to Lumbar one vertebrae with arthrodesis of Thoracic eleven Fracture;  Surgeon: Barnett Abu, MD;  Location: [facility];  Service: Neurosurgery;  Laterality: N/A;  Posterior Percutaneous Fixation from Thoracic nine to Lumbar one vertebrae with arthrodesis of Thoracic eleven Fracture   RIGHT/LEFT HEART CATH  AND CORONARY ANGIOGRAPHY N/A 08/27/2021   Procedure: RIGHT/LEFT HEART CATH AND CORONARY ANGIOGRAPHY;  Surgeon: Yates Decamp, MD;  Location: [facility];  Service: Cardiovascular;  Laterality: N/A;   TRANSESOPHAGEAL ECHOCARDIOGRAM (CATH LAB) N/A 11/16/2023   Procedure: TRANSESOPHAGEAL ECHOCARDIOGRAM;  Surgeon: Thurmon Fair, MD;  Location: [facility];  Service: Cardiovascular;  Laterality: N/A;    Allergies  Allergen Reactions   Ace Inhibitors Other (See Comments)   Lisinopril Cough    Current Outpatient Medications  Medication Sig Dispense Refill   acetaminophen (TYLENOL) 500 MG tablet Take 1,000 mg by mouth every 6 (six) hours as needed for moderate pain.     Apremilast (OTEZLA PO) Take by mouth.     Ascorbic Acid (VITAMIN C) 1000 MG tablet Take  1,000 mg by mouth daily.     atorvastatin (LIPITOR) 20 MG tablet Take 20 mg by mouth daily.     augmented betamethasone dipropionate (DIPROLENE-AF) 0.05 % cream Apply 1 Application topically as needed.     cholecalciferol (VITAMIN D3) 25 MCG (1000 UNIT) tablet Take 1,000 Units by mouth daily.     dapagliflozin propanediol (FARXIGA) 10 MG TABS tablet Take 10 mg by mouth daily.     ezetimibe (ZETIA) 10 MG tablet Take 1 tablet (10 mg total) by mouth daily. 90 tablet 3   folic acid (FOLVITE) 1 MG tablet Take 1 mg by mouth daily.     gabapentin (NEURONTIN) 100 MG capsule Take 100 mg by mouth 3 (three) times daily.     glipiZIDE (GLUCOTROL XL) 2.5 MG 24 hr tablet Take 5 mg by mouth daily with breakfast.     Insulin Degludec (TRESIBA FLEXTOUCH [place]) Inject 48 Units into the skin daily.     metFORMIN (GLUCOPHAGE) 500 MG tablet Take 500 mg by mouth 2 (two) times daily with a meal.     metoprolol succinate (TOPROL-XL) 50 MG 24 hr tablet Take 1 tablet (50 mg total) by mouth daily. 90 tablet 1   Multiple Vitamin (MULTIVITAMIN WITH MINERALS) TABS tablet Take 1 tablet by mouth daily.     Omega-3 Fatty Acids (FISH OIL) 1000 MG CAPS Take 1,000 mg by mouth daily.     OZEMPIC, 0.25 OR 0.5 MG/DOSE, 2 MG/3ML SOPN SMARTSIG:0.25 Milligram(s) SUB-Q Once a Week     rivaroxaban (XARELTO) 20 MG TABS tablet Take 1 tablet (20 mg total) by mouth daily. 08/04/13 last dose of xarelto in preparation for colonoscopy 30 tablet    sacubitril-valsartan (ENTRESTO) 97-103 MG Take 1 tablet by mouth 2 (two) times daily. 180 tablet 3   No current facility-administered medications for this visit.    Family History  Problem Relation Age of Onset   Heart disease Mother    Schizophrenia Mother    Heart disease Father    Diabetes Father    Heart disease Brother     Social History   Socioeconomic History   Marital status: Married    Spouse name: Not on file   Number of children: 3   Years of education: 18   Highest education  level: Not on file  Occupational History   Occupation: Programmer, applications: [employer]  Tobacco Use   Smoking status: Never   Smokeless tobacco: Never  Vaping Use   Vaping status: Never Used  Substance and Sexual Activity   Alcohol use: Not Currently    Comment: Previously drinking 3-5 drinks nightly x 20 year   Drug use: No   Sexual activity: Not on file  Other Topics Concern   Not on file  Social History Narrative      Lives with wife      One story home      Highest level edu- masters      Right handed      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   Social Drivers of Corporate investment banker Strain: Not on file  Food Insecurity: No Food Insecurity (11/11/2023)   Hunger Vital Sign    Worried About Running Out of Food in the Last Year: Never true    Ran Out of Food in the Last Year: Never true  Transportation Needs: No Transportation Needs (11/11/2023)   PRAPARE - Administrator, Civil Service (Medical): No    Lack of Transportation (Non-Medical): No  Physical Activity: Not on file  Stress: Not on file  Social Connections: Socially Integrated (11/11/2023)   Social Connection and Isolation Panel [NHANES]    Frequency of Communication with Friends and Family: More than three times a week    Frequency of Social Gatherings with Friends and Family: More than three times a week    Attends Religious Services: More than 4 times per year    Active Member of [employer] or Organizations: No    Attends Engineer, structural: More than 4 times per year    Marital Status: Married  Catering manager Violence: Not At Risk (11/11/2023)   Humiliation, Afraid, Rape, and Kick questionnaire    Fear of Current or Ex-Partner: No    Emotionally Abused: No    Physically Abused: No    Sexually Abused: No     REVIEW OF SYSTEMS:   [X]  denotes positive finding, [ ]  denotes negative finding Cardiac  Comments:  Chest pain or chest pressure:     Shortness of breath upon exertion:    Short of breath when lying flat:    Irregular heart rhythm:        Vascular    Pain in calf, thigh, or hip brought on by ambulation:    Pain in feet at night that wakes you up from your sleep:     Blood clot in your veins:    Leg swelling:         Pulmonary    Oxygen at home:    Productive cough:     Wheezing:         Neurologic    Sudden weakness in arms or legs:     Sudden numbness in arms or legs:     Sudden onset of difficulty speaking or slurred speech:    Temporary loss of vision in one eye:     Problems with dizziness:         Gastrointestinal    Blood in stool:     Vomited blood:         Genitourinary    Burning when urinating:     Blood in urine:        Psychiatric    Major depression:         Hematologic    Bleeding problems:    Problems with blood clotting too easily:        Skin    Rashes or ulcers: x       Constitutional    Fever or chills:      PHYSICAL EXAMINATION:  Today's Vitals   12/03/23 0911  BP: 111/68  Pulse: 67  Temp: 97.9 F (36.6 C)  TempSrc: Temporal  SpO2: 97%  Weight: 250 lb 11.2 oz (113.7 kg)  Height: 5\' 10"  (1.778 m)   Body mass index is 35.97 kg/m.   General:  WDWN in NAD; vital signs documented above Gait: Not observed HENT: WNL, normocephalic Pulmonary: normal non-labored breathing , without wheezing Cardiac: regular HR, without carotid bruits Abdomen: soft, NT; aortic pulse is not palpable Skin: without rashes Vascular Exam/Pulses: Palpable right DP pulse Extremities: right great toe wound improved from previous pictures in chart  Musculoskeletal: no muscle wasting or atrophy  Neurologic: A&O X 3 Psychiatric:  The pt has Normal affect.   Non-Invasive Vascular Imaging:   ABI's/TBI's on 09/29/2023: Right:  [place]/0.55 - Great toe pressure: 81 Left:  [place]/0.62 - Great toe pressure: 91    ASSESSMENT/PLAN:: 73 y.o. male here for follow up for PAD with hx of right great toe  wound.   -pt's right great toe wound is slowly improving.  He does have a 1+ palpable right DP pulse.  He does not have any rest pain or claudication.   -discussed with pt to protect his feet and inspect them daily.   -he will continue his follow up with Dr. Lilian Kapur and we will plan to see him in one year with repeat ABI.  I discussed with him that if he develops rest pain or worsening of this wound or new wounds that are slow to heal, we would need to see him sooner.  He expressed good understanding.  -continue statin   Doreatha Massed, [facility] : [facility] [phone]  Clinic MD:   Karin Lieu

## 2023-12-03 ENCOUNTER — Ambulatory Visit (HOSPITAL_COMMUNITY): Payer: Medicare Other

## 2023-12-03 ENCOUNTER — Ambulatory Visit: Payer: Medicare Other

## 2023-12-03 ENCOUNTER — Ambulatory Visit (INDEPENDENT_AMBULATORY_CARE_PROVIDER_SITE_OTHER): Admitting: Physician Assistant

## 2023-12-03 VITALS — BP 111/68 | HR 67 | Temp 97.9°F | Ht 70.0 in | Wt 250.7 lb

## 2023-12-03 DIAGNOSIS — I739 Peripheral vascular disease, unspecified: Secondary | ICD-10-CM | POA: Diagnosis not present

## 2023-12-04 ENCOUNTER — Telehealth: Payer: Self-pay

## 2023-12-04 ENCOUNTER — Encounter: Payer: Self-pay | Admitting: Podiatry

## 2023-12-04 NOTE — Telephone Encounter (Signed)
 Order form, office note and demographics faxed to [facility] [phone]

## 2023-12-04 NOTE — Telephone Encounter (Signed)
-----   Message from Edwin Cap sent at 12/04/2023  8:51 AM EDT ----- Can you send a supply order to [facility] for this patient?  Wound measurements 1.0 x 0.8 x 0.2 cm right great toe ulcer L97.512 Moderate drainage, 90-day supply, change q. OD with Prisma collagen, 4 x 4 gauze roll and tape.  Thank you!

## 2023-12-04 NOTE — Progress Notes (Signed)
  Subjective:  Patient ID: Anthony Case, male    DOB: 10-Jan-1951,  MRN: [id number]  Chief Complaint  Patient presents with   Diabetic Ulcer    Right 1st hallux. Diabetic ulcer. Doing okay. Has noticed some drainage with walking, no pain, no swelling, no redness. Does seem to be improving. He was recently hospitalized due to sepsis. Seen by Dr. Stacie Acres yesterday for [facility].     73 y.o. male presents with the above complaint. History confirmed with patient.     Objective:  Physical Exam: warm, good capillary refill and weakly palpable DP and PT pulses skin temperature is good has a full-thickness ulceration measuring 1.0 x 0.8 x 0.2 cm on the right plantar medial hallux.  Exposed subcutaneous tissue there is surrounding hyperkeratosis and fibrotic eschar.  No signs of infection  Measurements: 10/01/2023 1.3 x 1.5 x 0.3 centimeters    Radiographs: Right foot radiographs taken on 09/27/2023 shows no erosions or osteomyelitis  Results  Collected Updated Procedure   09/27/2023 0718 09/27/2023 1441 Hemoglobin A1c [[id number]]  (Abnormal)  Blood   Component Value Units  Hgb A1c MFr Bld 6.0 High   %  Mean Plasma Glucose 125.5  mg/dL       78/29/5621 3086 57/84/6962 1312 Sedimentation rate [[id number]]  (Abnormal)  Blood   Component Value Units  Sed Rate 50 High   mm/hr       09/27/2023 0718 09/27/2023 0733 CBC [[id number]]  (Abnormal)  Blood   Component Value Units  WBC 6.9 K/uL  RBC 4.24 MIL/uL  Hemoglobin 12.9 Low  g/dL  HCT 66.4 %  MCV 40.3 fL  MCH 30.4 pg  MCHC 32.7 g/dL  RDW 47.4 %  Platelets 272 K/uL  nRBC 0.0  %     Assessment:   1. Diabetic ulcer of toe of right foot associated with type 2 diabetes mellitus, with fat layer exposed (HCC)      Plan:  Patient was evaluated and treated and all questions answered.  Ulcer right hallux -We discussed the etiology and factors that are a part of the wound healing process.  We also discussed the risk of infection both  soft tissue and osteomyelitis from open ulceration.  Discussed the risk of limb loss if this happens or worsens. -Debridement as below. -Dressed with Prisma, DSD. -Continue home dressing changes every other day or every 3 days with gauze and Prisma -Continue off-loading with surgical shoe. -Vascular surgery feels he has adequate perfusion -Last antibiotics: No further oral antibiotics -Imaging: x-ray reviewed, shows no signs of erosions, osteolysis, osteomyelitis or emphysema. - Has made improvement so far.  Planning a 61-month trip in May I will see him back before he goes. - Supply order will be sent to [facility]  Procedure: Excisional Debridement of Wound Rationale: Removal of non-viable soft tissue from the wound to promote healing.  Anesthesia: none Post-Debridement Wound Measurements: Noted above Type of Debridement: Sharp Excisional Tissue Removed: Non-viable soft tissue Depth of Debridement: subcutaneous tissue. Technique: Sharp excisional debridement to bleeding, viable wound base.  Dressing: Dry, sterile, compression dressing. Disposition: Patient tolerated procedure well.     Return in about 2 weeks (around 12/15/2023) for wound care.

## 2023-12-08 DIAGNOSIS — L03115 Cellulitis of right lower limb: Secondary | ICD-10-CM | POA: Diagnosis not present

## 2023-12-08 DIAGNOSIS — E1151 Type 2 diabetes mellitus with diabetic peripheral angiopathy without gangrene: Secondary | ICD-10-CM | POA: Diagnosis not present

## 2023-12-08 DIAGNOSIS — I739 Peripheral vascular disease, unspecified: Secondary | ICD-10-CM | POA: Diagnosis not present

## 2023-12-08 DIAGNOSIS — R260 Ataxic gait: Secondary | ICD-10-CM | POA: Diagnosis not present

## 2023-12-10 DIAGNOSIS — R260 Ataxic gait: Secondary | ICD-10-CM | POA: Diagnosis not present

## 2023-12-15 ENCOUNTER — Encounter: Payer: Self-pay | Admitting: Podiatry

## 2023-12-15 ENCOUNTER — Ambulatory Visit (INDEPENDENT_AMBULATORY_CARE_PROVIDER_SITE_OTHER): Admitting: Podiatry

## 2023-12-15 DIAGNOSIS — E11621 Type 2 diabetes mellitus with foot ulcer: Secondary | ICD-10-CM | POA: Diagnosis not present

## 2023-12-15 DIAGNOSIS — L97512 Non-pressure chronic ulcer of other part of right foot with fat layer exposed: Secondary | ICD-10-CM | POA: Diagnosis not present

## 2023-12-15 DIAGNOSIS — R260 Ataxic gait: Secondary | ICD-10-CM | POA: Diagnosis not present

## 2023-12-15 NOTE — Progress Notes (Signed)
  Subjective:  Patient ID: Anthony Case, male    DOB: 1950/11/18,  MRN: [id number]  Chief Complaint  Patient presents with   Wound Check    Patient states everything has been going ok with his wound, there is no pain but when he walks a lot there is some drainage from his left toe     73 y.o. male presents with the above complaint. History confirmed with patient.     Objective:  Physical Exam: warm, good capillary refill and weakly palpable DP and PT pulses skin temperature is good has a full-thickness ulceration measuring 0.8 x 0.6 x 0.2 cm on the right plantar medial hallux.  Exposed subcutaneous tissue there is surrounding hyperkeratosis and fibrotic eschar.  No signs of infection  Measurements: 10/01/2023 1.3 x 1.5 x 0.3 centimeters    Radiographs: Right foot radiographs taken on 09/27/2023 shows no erosions or osteomyelitis  Results  Collected Updated Procedure   09/27/2023 0718 09/27/2023 1441 Hemoglobin A1c [[id number]]  (Abnormal)  Blood   Component Value Units  Hgb A1c MFr Bld 6.0 High   %  Mean Plasma Glucose 125.5  mg/dL       41/66/0630 1601 09/32/3557 1312 Sedimentation rate [[id number]]  (Abnormal)  Blood   Component Value Units  Sed Rate 50 High   mm/hr       09/27/2023 0718 09/27/2023 0733 CBC [[id number]]  (Abnormal)  Blood   Component Value Units  WBC 6.9 K/uL  RBC 4.24 MIL/uL  Hemoglobin 12.9 Low  g/dL  HCT 15.1 %  MCV 76.1 fL  MCH 30.4 pg  MCHC 32.7 g/dL  RDW 60.7 %  Platelets 272 K/uL  nRBC 0.0  %     Assessment:   1. Diabetic ulcer of toe of right foot associated with type 2 diabetes mellitus, with fat layer exposed (HCC)      Plan:  Patient was evaluated and treated and all questions answered.  Ulcer right hallux -We discussed the etiology and factors that are a part of the wound healing process.  We also discussed the risk of infection both soft tissue and osteomyelitis from open ulceration.  Discussed the risk of limb loss if  this happens or worsens. -Debridement as below. -Dressed with Prisma, DSD. -Continue home dressing changes every other day or every 3 days with gauze and Prisma, or ColActive collagen dressings -Continue off-loading with surgical shoe.  May return to regular shoe gear as tolerated for his trip -Vascular surgery feels he has adequate perfusion -Last antibiotics: No further oral antibiotics -Imaging: x-ray reviewed, shows no signs of erosions, osteolysis, osteomyelitis or emphysema. - Has an upcoming cross-country trip to visit national parks.  I did recommend him to plan his route and consider finding someone to evaluate this at least once per month while he is gone.  He is returning in August and I will reevaluate him then.  He will let me know if there are any issues or signs of infection while he is gone as well  Procedure: Excisional Debridement of Wound Rationale: Removal of non-viable soft tissue from the wound to promote healing.  Anesthesia: none Post-Debridement Wound Measurements: Noted above Type of Debridement: Sharp Excisional Tissue Removed: Non-viable soft tissue Depth of Debridement: subcutaneous tissue. Technique: Sharp excisional debridement to bleeding, viable wound base.  Dressing: Dry, sterile, compression dressing. Disposition: Patient tolerated procedure well.     No follow-ups on file.

## 2023-12-16 DIAGNOSIS — I5022 Chronic systolic (congestive) heart failure: Secondary | ICD-10-CM | POA: Diagnosis not present

## 2023-12-16 DIAGNOSIS — I739 Peripheral vascular disease, unspecified: Secondary | ICD-10-CM | POA: Diagnosis not present

## 2023-12-16 DIAGNOSIS — E1151 Type 2 diabetes mellitus with diabetic peripheral angiopathy without gangrene: Secondary | ICD-10-CM | POA: Diagnosis not present

## 2023-12-16 DIAGNOSIS — L03031 Cellulitis of right toe: Secondary | ICD-10-CM | POA: Diagnosis not present

## 2023-12-16 DIAGNOSIS — L03115 Cellulitis of right lower limb: Secondary | ICD-10-CM | POA: Diagnosis not present

## 2023-12-17 DIAGNOSIS — R260 Ataxic gait: Secondary | ICD-10-CM | POA: Diagnosis not present

## 2023-12-22 DIAGNOSIS — R260 Ataxic gait: Secondary | ICD-10-CM | POA: Diagnosis not present

## 2023-12-24 DIAGNOSIS — R260 Ataxic gait: Secondary | ICD-10-CM | POA: Diagnosis not present

## 2023-12-28 DIAGNOSIS — E113293 Type 2 diabetes mellitus with mild nonproliferative diabetic retinopathy without macular edema, bilateral: Secondary | ICD-10-CM | POA: Diagnosis not present

## 2023-12-28 DIAGNOSIS — R260 Ataxic gait: Secondary | ICD-10-CM | POA: Diagnosis not present

## 2024-01-15 ENCOUNTER — Ambulatory Visit: Admitting: Cardiology

## 2024-02-24 DIAGNOSIS — Z7901 Long term (current) use of anticoagulants: Secondary | ICD-10-CM | POA: Diagnosis not present

## 2024-02-24 DIAGNOSIS — E785 Hyperlipidemia, unspecified: Secondary | ICD-10-CM | POA: Diagnosis not present

## 2024-02-24 DIAGNOSIS — R2241 Localized swelling, mass and lump, right lower limb: Secondary | ICD-10-CM | POA: Diagnosis not present

## 2024-02-24 DIAGNOSIS — Z7984 Long term (current) use of oral hypoglycemic drugs: Secondary | ICD-10-CM | POA: Diagnosis not present

## 2024-02-24 DIAGNOSIS — Z79631 Long term (current) use of antimetabolite agent: Secondary | ICD-10-CM | POA: Diagnosis not present

## 2024-02-24 DIAGNOSIS — E872 Acidosis, unspecified: Secondary | ICD-10-CM | POA: Diagnosis not present

## 2024-02-24 DIAGNOSIS — L0390 Cellulitis, unspecified: Secondary | ICD-10-CM | POA: Diagnosis not present

## 2024-02-24 DIAGNOSIS — L4052 Psoriatic arthritis mutilans: Secondary | ICD-10-CM | POA: Diagnosis not present

## 2024-02-24 DIAGNOSIS — I428 Other cardiomyopathies: Secondary | ICD-10-CM | POA: Diagnosis not present

## 2024-02-24 DIAGNOSIS — I4891 Unspecified atrial fibrillation: Secondary | ICD-10-CM | POA: Diagnosis not present

## 2024-02-24 DIAGNOSIS — K759 Inflammatory liver disease, unspecified: Secondary | ICD-10-CM | POA: Diagnosis not present

## 2024-02-24 DIAGNOSIS — B199 Unspecified viral hepatitis without hepatic coma: Secondary | ICD-10-CM | POA: Diagnosis not present

## 2024-02-24 DIAGNOSIS — E11649 Type 2 diabetes mellitus with hypoglycemia without coma: Secondary | ICD-10-CM | POA: Diagnosis not present

## 2024-02-24 DIAGNOSIS — A419 Sepsis, unspecified organism: Secondary | ICD-10-CM | POA: Diagnosis not present

## 2024-02-24 DIAGNOSIS — R7989 Other specified abnormal findings of blood chemistry: Secondary | ICD-10-CM | POA: Diagnosis not present

## 2024-02-24 DIAGNOSIS — I5020 Unspecified systolic (congestive) heart failure: Secondary | ICD-10-CM | POA: Diagnosis not present

## 2024-02-24 DIAGNOSIS — Z86711 Personal history of pulmonary embolism: Secondary | ICD-10-CM | POA: Diagnosis not present

## 2024-02-24 DIAGNOSIS — L4050 Arthropathic psoriasis, unspecified: Secondary | ICD-10-CM | POA: Diagnosis not present

## 2024-02-24 DIAGNOSIS — N179 Acute kidney failure, unspecified: Secondary | ICD-10-CM | POA: Diagnosis not present

## 2024-02-24 DIAGNOSIS — D72829 Elevated white blood cell count, unspecified: Secondary | ICD-10-CM | POA: Diagnosis not present

## 2024-02-24 DIAGNOSIS — I5032 Chronic diastolic (congestive) heart failure: Secondary | ICD-10-CM | POA: Diagnosis not present

## 2024-02-24 DIAGNOSIS — R6521 Severe sepsis with septic shock: Secondary | ICD-10-CM | POA: Diagnosis not present

## 2024-02-24 DIAGNOSIS — Z8614 Personal history of Methicillin resistant Staphylococcus aureus infection: Secondary | ICD-10-CM | POA: Diagnosis not present

## 2024-02-24 DIAGNOSIS — I5030 Unspecified diastolic (congestive) heart failure: Secondary | ICD-10-CM | POA: Diagnosis not present

## 2024-02-24 DIAGNOSIS — R590 Localized enlarged lymph nodes: Secondary | ICD-10-CM | POA: Diagnosis not present

## 2024-02-24 DIAGNOSIS — R6520 Severe sepsis without septic shock: Secondary | ICD-10-CM | POA: Diagnosis not present

## 2024-02-24 DIAGNOSIS — Z794 Long term (current) use of insulin: Secondary | ICD-10-CM | POA: Diagnosis not present

## 2024-02-24 DIAGNOSIS — Z86718 Personal history of other venous thrombosis and embolism: Secondary | ICD-10-CM | POA: Diagnosis not present

## 2024-02-24 DIAGNOSIS — L03115 Cellulitis of right lower limb: Secondary | ICD-10-CM | POA: Diagnosis not present

## 2024-02-24 DIAGNOSIS — M19071 Primary osteoarthritis, right ankle and foot: Secondary | ICD-10-CM | POA: Diagnosis not present

## 2024-02-24 DIAGNOSIS — I4589 Other specified conduction disorders: Secondary | ICD-10-CM | POA: Diagnosis not present

## 2024-02-24 DIAGNOSIS — E8809 Other disorders of plasma-protein metabolism, not elsewhere classified: Secondary | ICD-10-CM | POA: Diagnosis not present

## 2024-02-24 DIAGNOSIS — E119 Type 2 diabetes mellitus without complications: Secondary | ICD-10-CM | POA: Diagnosis not present

## 2024-02-24 DIAGNOSIS — I959 Hypotension, unspecified: Secondary | ICD-10-CM | POA: Diagnosis not present

## 2024-02-25 DIAGNOSIS — I4589 Other specified conduction disorders: Secondary | ICD-10-CM | POA: Diagnosis not present

## 2024-02-25 DIAGNOSIS — L4052 Psoriatic arthritis mutilans: Secondary | ICD-10-CM | POA: Diagnosis not present

## 2024-02-25 DIAGNOSIS — D72829 Elevated white blood cell count, unspecified: Secondary | ICD-10-CM | POA: Diagnosis not present

## 2024-02-25 DIAGNOSIS — R2241 Localized swelling, mass and lump, right lower limb: Secondary | ICD-10-CM | POA: Diagnosis not present

## 2024-02-25 DIAGNOSIS — E119 Type 2 diabetes mellitus without complications: Secondary | ICD-10-CM | POA: Diagnosis not present

## 2024-02-25 DIAGNOSIS — L03115 Cellulitis of right lower limb: Secondary | ICD-10-CM | POA: Diagnosis not present

## 2024-02-25 DIAGNOSIS — I5030 Unspecified diastolic (congestive) heart failure: Secondary | ICD-10-CM | POA: Diagnosis not present

## 2024-02-25 DIAGNOSIS — E785 Hyperlipidemia, unspecified: Secondary | ICD-10-CM | POA: Diagnosis not present

## 2024-02-25 DIAGNOSIS — I4891 Unspecified atrial fibrillation: Secondary | ICD-10-CM | POA: Diagnosis not present

## 2024-02-25 DIAGNOSIS — R7989 Other specified abnormal findings of blood chemistry: Secondary | ICD-10-CM | POA: Diagnosis not present

## 2024-02-25 DIAGNOSIS — A419 Sepsis, unspecified organism: Secondary | ICD-10-CM | POA: Diagnosis not present

## 2024-02-25 DIAGNOSIS — I959 Hypotension, unspecified: Secondary | ICD-10-CM | POA: Diagnosis not present

## 2024-02-26 DIAGNOSIS — E11649 Type 2 diabetes mellitus with hypoglycemia without coma: Secondary | ICD-10-CM | POA: Diagnosis not present

## 2024-02-26 DIAGNOSIS — B199 Unspecified viral hepatitis without hepatic coma: Secondary | ICD-10-CM | POA: Diagnosis not present

## 2024-02-26 DIAGNOSIS — A419 Sepsis, unspecified organism: Secondary | ICD-10-CM | POA: Diagnosis not present

## 2024-02-26 DIAGNOSIS — R590 Localized enlarged lymph nodes: Secondary | ICD-10-CM | POA: Diagnosis not present

## 2024-02-26 DIAGNOSIS — L03115 Cellulitis of right lower limb: Secondary | ICD-10-CM | POA: Diagnosis not present

## 2024-02-26 DIAGNOSIS — E8809 Other disorders of plasma-protein metabolism, not elsewhere classified: Secondary | ICD-10-CM | POA: Diagnosis not present

## 2024-02-26 DIAGNOSIS — R6521 Severe sepsis with septic shock: Secondary | ICD-10-CM | POA: Diagnosis not present

## 2024-02-27 DIAGNOSIS — I5020 Unspecified systolic (congestive) heart failure: Secondary | ICD-10-CM | POA: Diagnosis not present

## 2024-02-27 DIAGNOSIS — K759 Inflammatory liver disease, unspecified: Secondary | ICD-10-CM | POA: Diagnosis not present

## 2024-02-27 DIAGNOSIS — E8809 Other disorders of plasma-protein metabolism, not elsewhere classified: Secondary | ICD-10-CM | POA: Diagnosis not present

## 2024-02-27 DIAGNOSIS — E872 Acidosis, unspecified: Secondary | ICD-10-CM | POA: Diagnosis not present

## 2024-02-27 DIAGNOSIS — E11649 Type 2 diabetes mellitus with hypoglycemia without coma: Secondary | ICD-10-CM | POA: Diagnosis not present

## 2024-02-27 DIAGNOSIS — A419 Sepsis, unspecified organism: Secondary | ICD-10-CM | POA: Diagnosis not present

## 2024-02-27 DIAGNOSIS — R6520 Severe sepsis without septic shock: Secondary | ICD-10-CM | POA: Diagnosis not present

## 2024-02-27 DIAGNOSIS — L03115 Cellulitis of right lower limb: Secondary | ICD-10-CM | POA: Diagnosis not present

## 2024-02-28 DIAGNOSIS — R6520 Severe sepsis without septic shock: Secondary | ICD-10-CM | POA: Diagnosis not present

## 2024-02-28 DIAGNOSIS — E8809 Other disorders of plasma-protein metabolism, not elsewhere classified: Secondary | ICD-10-CM | POA: Diagnosis not present

## 2024-02-28 DIAGNOSIS — E872 Acidosis, unspecified: Secondary | ICD-10-CM | POA: Diagnosis not present

## 2024-02-28 DIAGNOSIS — A419 Sepsis, unspecified organism: Secondary | ICD-10-CM | POA: Diagnosis not present

## 2024-02-28 DIAGNOSIS — I5020 Unspecified systolic (congestive) heart failure: Secondary | ICD-10-CM | POA: Diagnosis not present

## 2024-02-28 DIAGNOSIS — L03115 Cellulitis of right lower limb: Secondary | ICD-10-CM | POA: Diagnosis not present

## 2024-02-28 DIAGNOSIS — K759 Inflammatory liver disease, unspecified: Secondary | ICD-10-CM | POA: Diagnosis not present

## 2024-02-28 DIAGNOSIS — E11649 Type 2 diabetes mellitus with hypoglycemia without coma: Secondary | ICD-10-CM | POA: Diagnosis not present

## 2024-02-29 DIAGNOSIS — I5020 Unspecified systolic (congestive) heart failure: Secondary | ICD-10-CM | POA: Diagnosis not present

## 2024-02-29 DIAGNOSIS — K759 Inflammatory liver disease, unspecified: Secondary | ICD-10-CM | POA: Diagnosis not present

## 2024-02-29 DIAGNOSIS — R6520 Severe sepsis without septic shock: Secondary | ICD-10-CM | POA: Diagnosis not present

## 2024-02-29 DIAGNOSIS — A419 Sepsis, unspecified organism: Secondary | ICD-10-CM | POA: Diagnosis not present

## 2024-02-29 DIAGNOSIS — E8809 Other disorders of plasma-protein metabolism, not elsewhere classified: Secondary | ICD-10-CM | POA: Diagnosis not present

## 2024-02-29 DIAGNOSIS — E872 Acidosis, unspecified: Secondary | ICD-10-CM | POA: Diagnosis not present

## 2024-02-29 DIAGNOSIS — E11649 Type 2 diabetes mellitus with hypoglycemia without coma: Secondary | ICD-10-CM | POA: Diagnosis not present

## 2024-02-29 DIAGNOSIS — L03115 Cellulitis of right lower limb: Secondary | ICD-10-CM | POA: Diagnosis not present

## 2024-03-01 DIAGNOSIS — A419 Sepsis, unspecified organism: Secondary | ICD-10-CM | POA: Diagnosis not present

## 2024-03-01 DIAGNOSIS — E8809 Other disorders of plasma-protein metabolism, not elsewhere classified: Secondary | ICD-10-CM | POA: Diagnosis not present

## 2024-03-01 DIAGNOSIS — I959 Hypotension, unspecified: Secondary | ICD-10-CM | POA: Diagnosis not present

## 2024-03-01 DIAGNOSIS — L0390 Cellulitis, unspecified: Secondary | ICD-10-CM | POA: Diagnosis not present

## 2024-03-01 DIAGNOSIS — I5020 Unspecified systolic (congestive) heart failure: Secondary | ICD-10-CM | POA: Diagnosis not present

## 2024-03-01 DIAGNOSIS — E11649 Type 2 diabetes mellitus with hypoglycemia without coma: Secondary | ICD-10-CM | POA: Diagnosis not present

## 2024-03-01 DIAGNOSIS — K759 Inflammatory liver disease, unspecified: Secondary | ICD-10-CM | POA: Diagnosis not present

## 2024-03-01 DIAGNOSIS — R6520 Severe sepsis without septic shock: Secondary | ICD-10-CM | POA: Diagnosis not present

## 2024-03-29 ENCOUNTER — Other Ambulatory Visit: Payer: Self-pay

## 2024-03-29 DIAGNOSIS — T50901A Poisoning by unspecified drugs, medicaments and biological substances, accidental (unintentional), initial encounter: Secondary | ICD-10-CM | POA: Diagnosis not present

## 2024-03-29 DIAGNOSIS — E1151 Type 2 diabetes mellitus with diabetic peripheral angiopathy without gangrene: Secondary | ICD-10-CM | POA: Diagnosis not present

## 2024-03-29 DIAGNOSIS — L4050 Arthropathic psoriasis, unspecified: Secondary | ICD-10-CM | POA: Diagnosis not present

## 2024-03-29 DIAGNOSIS — G4733 Obstructive sleep apnea (adult) (pediatric): Secondary | ICD-10-CM | POA: Diagnosis not present

## 2024-03-29 DIAGNOSIS — L03115 Cellulitis of right lower limb: Secondary | ICD-10-CM | POA: Diagnosis not present

## 2024-03-29 DIAGNOSIS — I5022 Chronic systolic (congestive) heart failure: Secondary | ICD-10-CM | POA: Diagnosis not present

## 2024-03-29 DIAGNOSIS — Q230 Congenital stenosis of aortic valve: Secondary | ICD-10-CM | POA: Diagnosis not present

## 2024-03-29 DIAGNOSIS — I739 Peripheral vascular disease, unspecified: Secondary | ICD-10-CM | POA: Diagnosis not present

## 2024-03-29 DIAGNOSIS — L97511 Non-pressure chronic ulcer of other part of right foot limited to breakdown of skin: Secondary | ICD-10-CM | POA: Diagnosis not present

## 2024-03-29 MED ORDER — EMETROL 230 MG PO CHEW: 2.0000 | CHEWABLE_TABLET | ORAL | 0 refills | Status: DC | PRN

## 2024-04-04 DIAGNOSIS — E113393 Type 2 diabetes mellitus with moderate nonproliferative diabetic retinopathy without macular edema, bilateral: Secondary | ICD-10-CM | POA: Diagnosis not present

## 2024-04-04 DIAGNOSIS — H2513 Age-related nuclear cataract, bilateral: Secondary | ICD-10-CM | POA: Diagnosis not present

## 2024-04-14 DIAGNOSIS — I5020 Unspecified systolic (congestive) heart failure: Secondary | ICD-10-CM | POA: Diagnosis not present

## 2024-04-14 DIAGNOSIS — L4050 Arthropathic psoriasis, unspecified: Secondary | ICD-10-CM | POA: Diagnosis not present

## 2024-04-14 DIAGNOSIS — R7989 Other specified abnormal findings of blood chemistry: Secondary | ICD-10-CM | POA: Diagnosis not present

## 2024-04-14 DIAGNOSIS — Z79899 Other long term (current) drug therapy: Secondary | ICD-10-CM | POA: Diagnosis not present

## 2024-04-14 DIAGNOSIS — L409 Psoriasis, unspecified: Secondary | ICD-10-CM | POA: Diagnosis not present

## 2024-04-14 DIAGNOSIS — Z6831 Body mass index (BMI) 31.0-31.9, adult: Secondary | ICD-10-CM | POA: Diagnosis not present

## 2024-04-14 DIAGNOSIS — S22080A Wedge compression fracture of T11-T12 vertebra, initial encounter for closed fracture: Secondary | ICD-10-CM | POA: Diagnosis not present

## 2024-04-14 DIAGNOSIS — E669 Obesity, unspecified: Secondary | ICD-10-CM | POA: Diagnosis not present

## 2024-04-14 DIAGNOSIS — M1991 Primary osteoarthritis, unspecified site: Secondary | ICD-10-CM | POA: Diagnosis not present

## 2024-04-19 ENCOUNTER — Ambulatory Visit (INDEPENDENT_AMBULATORY_CARE_PROVIDER_SITE_OTHER): Admitting: Podiatry

## 2024-04-19 VITALS — Ht 70.0 in | Wt 250.7 lb

## 2024-04-19 DIAGNOSIS — H2513 Age-related nuclear cataract, bilateral: Secondary | ICD-10-CM | POA: Diagnosis not present

## 2024-04-19 DIAGNOSIS — E113293 Type 2 diabetes mellitus with mild nonproliferative diabetic retinopathy without macular edema, bilateral: Secondary | ICD-10-CM | POA: Diagnosis not present

## 2024-04-19 DIAGNOSIS — H18413 Arcus senilis, bilateral: Secondary | ICD-10-CM | POA: Diagnosis not present

## 2024-04-19 DIAGNOSIS — H2511 Age-related nuclear cataract, right eye: Secondary | ICD-10-CM | POA: Diagnosis not present

## 2024-04-19 DIAGNOSIS — E11621 Type 2 diabetes mellitus with foot ulcer: Secondary | ICD-10-CM | POA: Diagnosis not present

## 2024-04-19 DIAGNOSIS — L97511 Non-pressure chronic ulcer of other part of right foot limited to breakdown of skin: Secondary | ICD-10-CM | POA: Diagnosis not present

## 2024-04-19 DIAGNOSIS — H25013 Cortical age-related cataract, bilateral: Secondary | ICD-10-CM | POA: Diagnosis not present

## 2024-04-21 ENCOUNTER — Encounter: Payer: Self-pay | Admitting: Podiatry

## 2024-04-21 ENCOUNTER — Ambulatory Visit (INDEPENDENT_AMBULATORY_CARE_PROVIDER_SITE_OTHER): Admitting: Podiatry

## 2024-04-21 DIAGNOSIS — L089 Local infection of the skin and subcutaneous tissue, unspecified: Secondary | ICD-10-CM

## 2024-04-21 DIAGNOSIS — B351 Tinea unguium: Secondary | ICD-10-CM

## 2024-04-21 DIAGNOSIS — M79675 Pain in left toe(s): Secondary | ICD-10-CM | POA: Diagnosis not present

## 2024-04-21 DIAGNOSIS — E11628 Type 2 diabetes mellitus with other skin complications: Secondary | ICD-10-CM

## 2024-04-21 DIAGNOSIS — E11621 Type 2 diabetes mellitus with foot ulcer: Secondary | ICD-10-CM

## 2024-04-21 DIAGNOSIS — L97512 Non-pressure chronic ulcer of other part of right foot with fat layer exposed: Secondary | ICD-10-CM

## 2024-04-21 NOTE — Progress Notes (Signed)
This patient returns to my office for at risk foot care.  This patient requires this care by a professional since this patient will be at risk due to having diabetic neuropathy and history of DVT.   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 This patient is unable to cut nails himself since the patient cannot reach his nails.These nails are painful walking and wearing shoes. This patient presents for at risk foot care today.  General Appearance  Alert, conversant and in no acute stress.  Vascular  Dorsalis pedis and posterior tibial  pulses are palpable  bilaterally.  Capillary return is within normal limits  bilaterally. Temperature is within normal limits  bilaterally.  Neurologic  Senn-Weinstein monofilament wire test within normal limits  Left foot.  LOPS absent rght foot.. Muscle power within normal limits bilaterally.  Nails Thick disfigured discolored nails with subungual debris hallux toenails. No evidence of bacterial infection or drainage bilaterally.  Orthopedic  No limitations of motion  feet .  No crepitus or effusions noted.  No bony pathology or digital deformities noted.  HAV  B/L.  DJD right midfoot.  Skin  normotropic skin with no porokeratosis noted bilaterally.  No signs of infections .  Healed ulcer left hallux.  Asymptomatic callus sub 5th left foot.  Onychomycosis  Pain in right toes  Pain in left toes  Consent was obtained for treatment procedures.   Mechanical debridement of nails  hallux  left. performed with a nail nipper.  Filed with dremel without incident.    Return office visit   6 months .                 Told patient to return for periodic foot care and evaluation due to potential at risk complications.   Helane Gunther DPM

## 2024-04-22 NOTE — Progress Notes (Signed)
  Subjective:  Patient ID: Anthony Case, male    DOB: 06-01-1951,  MRN: [id number]  Chief Complaint  Patient presents with   Diabetic Ulcer    RM 2 Patient is here for diabetic ulcer of the right hallux. Wound has scabbed over with no drainage. Patient states no pain or discomfort from ulcer.    73 y.o. male presents with the above complaint. History confirmed with patient.   His wound has healed since he was on his trip.  He had a bout of cellulitis that had to be admitted for [facility]  Objective:  Physical Exam: warm, good capillary refill and weakly palpable DP and PT pulses skin temperature is good h prior medial hallux ulcer has healed completely  Measurements: 10/01/2023 1.3 x 1.5 x 0.3 centimeters    Radiographs: Right foot radiographs taken on 09/27/2023 shows no erosions or osteomyelitis  Results  Collected Updated Procedure   09/27/2023 0718 09/27/2023 1441 Hemoglobin A1c [[id number]]  (Abnormal)  Blood   Component Value Units  Hgb A1c MFr Bld 6.0 High   %  Mean Plasma Glucose 125.5  mg/dL       97/97/7974 9281 97/97/7974 1312 Sedimentation rate [[id number]]  (Abnormal)  Blood   Component Value Units  Sed Rate 50 High   mm/hr       09/27/2023 0718 09/27/2023 0733 CBC [[id number]]  (Abnormal)  Blood   Component Value Units  WBC 6.9 K/uL  RBC 4.24 MIL/uL  Hemoglobin 12.9 Low  g/dL  HCT 60.4 %  MCV 06.7 fL  MCH 30.4 pg  MCHC 32.7 g/dL  RDW 85.1 %  Platelets 272 K/uL  nRBC 0.0  %     Assessment:   1. Diabetic ulcer of toe of right foot associated with type 2 diabetes mellitus, limited to breakdown of skin (HCC)      Plan:  Patient was evaluated and treated and all questions answered.  Ulcer right hallux - Ulceration is healed.  We discussed the risk of recurrence.  Follow-up with me as needed, monitor closely for signs of recurrence    Return if symptoms worsen or fail to improve.

## 2024-04-26 DIAGNOSIS — Z23 Encounter for immunization: Secondary | ICD-10-CM | POA: Diagnosis not present

## 2024-04-27 DIAGNOSIS — H43823 Vitreomacular adhesion, bilateral: Secondary | ICD-10-CM | POA: Diagnosis not present

## 2024-04-27 DIAGNOSIS — H2513 Age-related nuclear cataract, bilateral: Secondary | ICD-10-CM | POA: Diagnosis not present

## 2024-04-27 DIAGNOSIS — E113393 Type 2 diabetes mellitus with moderate nonproliferative diabetic retinopathy without macular edema, bilateral: Secondary | ICD-10-CM | POA: Diagnosis not present

## 2024-05-02 DIAGNOSIS — Z23 Encounter for immunization: Secondary | ICD-10-CM | POA: Diagnosis not present

## 2024-06-16 DIAGNOSIS — T148XXA Other injury of unspecified body region, initial encounter: Secondary | ICD-10-CM | POA: Diagnosis not present

## 2024-06-16 DIAGNOSIS — B029 Zoster without complications: Secondary | ICD-10-CM | POA: Diagnosis not present

## 2024-06-22 DIAGNOSIS — B029 Zoster without complications: Secondary | ICD-10-CM | POA: Diagnosis not present

## 2024-06-28 DIAGNOSIS — I5022 Chronic systolic (congestive) heart failure: Secondary | ICD-10-CM | POA: Diagnosis not present

## 2024-06-28 DIAGNOSIS — E1151 Type 2 diabetes mellitus with diabetic peripheral angiopathy without gangrene: Secondary | ICD-10-CM | POA: Diagnosis not present

## 2024-06-28 DIAGNOSIS — G4733 Obstructive sleep apnea (adult) (pediatric): Secondary | ICD-10-CM | POA: Diagnosis not present

## 2024-06-28 DIAGNOSIS — L97511 Non-pressure chronic ulcer of other part of right foot limited to breakdown of skin: Secondary | ICD-10-CM | POA: Diagnosis not present

## 2024-06-28 DIAGNOSIS — Z7901 Long term (current) use of anticoagulants: Secondary | ICD-10-CM | POA: Diagnosis not present

## 2024-06-28 DIAGNOSIS — Q230 Congenital stenosis of aortic valve: Secondary | ICD-10-CM | POA: Diagnosis not present

## 2024-06-28 DIAGNOSIS — E785 Hyperlipidemia, unspecified: Secondary | ICD-10-CM | POA: Diagnosis not present

## 2024-06-28 DIAGNOSIS — I739 Peripheral vascular disease, unspecified: Secondary | ICD-10-CM | POA: Diagnosis not present

## 2024-06-28 DIAGNOSIS — R2681 Unsteadiness on feet: Secondary | ICD-10-CM | POA: Diagnosis not present

## 2024-06-30 DIAGNOSIS — R972 Elevated prostate specific antigen [PSA]: Secondary | ICD-10-CM | POA: Diagnosis not present

## 2024-07-07 DIAGNOSIS — N401 Enlarged prostate with lower urinary tract symptoms: Secondary | ICD-10-CM | POA: Diagnosis not present

## 2024-07-07 DIAGNOSIS — R972 Elevated prostate specific antigen [PSA]: Secondary | ICD-10-CM | POA: Diagnosis not present

## 2024-07-18 ENCOUNTER — Telehealth: Payer: Self-pay | Admitting: Cardiology

## 2024-07-18 NOTE — Telephone Encounter (Signed)
 After looking at the chart, it looks like the prescription has not been refilled since 2023. Just want to verify that patient should be on this med and dose.

## 2024-07-18 NOTE — Telephone Encounter (Signed)
 Sending message to verify that you are okay with transitioning to generic Entresto ?

## 2024-07-18 NOTE — Telephone Encounter (Signed)
 Yes

## 2024-07-18 NOTE — Telephone Encounter (Signed)
 Pt c/o medication issue:  1. Name of Medication:   sacubitril -valsartan  (ENTRESTO ) 97-103 MG   2. How are you currently taking this medication (dosage and times per day)?   As prescribed  3. Are you having a reaction (difficulty breathing--STAT)?   4. What is your medication issue?   Patient stated he wants to get prescription of the generic version of this medication.

## 2024-07-18 NOTE — Telephone Encounter (Signed)
 He is on   Sacubitril -Valsartan  97-103 MG 1 tablet Oral 2 times daily

## 2024-07-19 MED ORDER — SACUBITRIL-VALSARTAN 97-103 MG PO TABS: 1.0000 | ORAL_TABLET | Freq: Two times a day (BID) | ORAL | 3 refills | Status: DC

## 2024-07-19 NOTE — Telephone Encounter (Signed)
 Pt returning call about medication

## 2024-07-19 NOTE — Telephone Encounter (Signed)
 I called and spoke with the patient. He stated he would like a refill on his Entresto  but be able to get the generic version of the medication. I was able to put that refill order in and send it over to the preferred pharmacy. He also stated that he was never notified for his annual appointment reminder with Dr. Ladona and would like to get that scheduled. I was able to get him scheduled for an office visit with Dr. Ladona on 10/03/24. The patient was very appreciative of the call back and said he will let us  know if he needed anything else.

## 2024-07-28 DIAGNOSIS — R260 Ataxic gait: Secondary | ICD-10-CM | POA: Diagnosis not present

## 2024-08-02 DIAGNOSIS — R260 Ataxic gait: Secondary | ICD-10-CM | POA: Diagnosis not present

## 2024-08-31 ENCOUNTER — Emergency Department (HOSPITAL_COMMUNITY)

## 2024-08-31 ENCOUNTER — Encounter (HOSPITAL_COMMUNITY): Payer: Self-pay

## 2024-08-31 ENCOUNTER — Inpatient Hospital Stay (HOSPITAL_COMMUNITY)
Admission: EM | Admit: 2024-08-31 | Discharge: 2024-09-06 | DRG: 872 | Disposition: A | Discharge status: Home / Self Care | Attending: Internal Medicine | Admitting: Internal Medicine

## 2024-08-31 ENCOUNTER — Other Ambulatory Visit: Payer: Self-pay

## 2024-08-31 DIAGNOSIS — L4050 Arthropathic psoriasis, unspecified: Secondary | ICD-10-CM | POA: Diagnosis present

## 2024-08-31 DIAGNOSIS — Z794 Long term (current) use of insulin: Secondary | ICD-10-CM

## 2024-08-31 DIAGNOSIS — A419 Sepsis, unspecified organism: Principal | ICD-10-CM | POA: Diagnosis present

## 2024-08-31 DIAGNOSIS — L03115 Cellulitis of right lower limb: Secondary | ICD-10-CM | POA: Diagnosis present

## 2024-08-31 DIAGNOSIS — I110 Hypertensive heart disease with heart failure: Secondary | ICD-10-CM | POA: Diagnosis present

## 2024-08-31 DIAGNOSIS — D84821 Immunodeficiency due to drugs: Secondary | ICD-10-CM | POA: Diagnosis present

## 2024-08-31 DIAGNOSIS — E782 Mixed hyperlipidemia: Secondary | ICD-10-CM | POA: Diagnosis not present

## 2024-08-31 DIAGNOSIS — I5020 Unspecified systolic (congestive) heart failure: Secondary | ICD-10-CM | POA: Diagnosis present

## 2024-08-31 DIAGNOSIS — Z833 Family history of diabetes mellitus: Secondary | ICD-10-CM | POA: Diagnosis not present

## 2024-08-31 DIAGNOSIS — Z8249 Family history of ischemic heart disease and other diseases of the circulatory system: Secondary | ICD-10-CM | POA: Diagnosis not present

## 2024-08-31 DIAGNOSIS — M79671 Pain in right foot: Secondary | ICD-10-CM | POA: Diagnosis not present

## 2024-08-31 DIAGNOSIS — I5042 Chronic combined systolic (congestive) and diastolic (congestive) heart failure: Secondary | ICD-10-CM | POA: Diagnosis present

## 2024-08-31 DIAGNOSIS — J310 Chronic rhinitis: Secondary | ICD-10-CM | POA: Diagnosis present

## 2024-08-31 DIAGNOSIS — Z86711 Personal history of pulmonary embolism: Secondary | ICD-10-CM

## 2024-08-31 DIAGNOSIS — Z7984 Long term (current) use of oral hypoglycemic drugs: Secondary | ICD-10-CM

## 2024-08-31 DIAGNOSIS — Z7960 Long term (current) use of unspecified immunomodulators and immunosuppressants: Secondary | ICD-10-CM | POA: Diagnosis not present

## 2024-08-31 DIAGNOSIS — Z86718 Personal history of other venous thrombosis and embolism: Secondary | ICD-10-CM | POA: Diagnosis not present

## 2024-08-31 DIAGNOSIS — N179 Acute kidney failure, unspecified: Secondary | ICD-10-CM | POA: Diagnosis present

## 2024-08-31 DIAGNOSIS — E785 Hyperlipidemia, unspecified: Secondary | ICD-10-CM | POA: Diagnosis present

## 2024-08-31 DIAGNOSIS — E11621 Type 2 diabetes mellitus with foot ulcer: Secondary | ICD-10-CM | POA: Diagnosis present

## 2024-08-31 DIAGNOSIS — Z888 Allergy status to other drugs, medicaments and biological substances status: Secondary | ICD-10-CM

## 2024-08-31 DIAGNOSIS — I350 Nonrheumatic aortic (valve) stenosis: Secondary | ICD-10-CM | POA: Diagnosis present

## 2024-08-31 DIAGNOSIS — R6520 Severe sepsis without septic shock: Secondary | ICD-10-CM | POA: Diagnosis present

## 2024-08-31 DIAGNOSIS — N19 Unspecified kidney failure: Secondary | ICD-10-CM | POA: Diagnosis present

## 2024-08-31 DIAGNOSIS — E119 Type 2 diabetes mellitus without complications: Secondary | ICD-10-CM

## 2024-08-31 DIAGNOSIS — E1142 Type 2 diabetes mellitus with diabetic polyneuropathy: Secondary | ICD-10-CM | POA: Diagnosis present

## 2024-08-31 DIAGNOSIS — Z818 Family history of other mental and behavioral disorders: Secondary | ICD-10-CM

## 2024-08-31 DIAGNOSIS — Z7901 Long term (current) use of anticoagulants: Secondary | ICD-10-CM | POA: Diagnosis not present

## 2024-08-31 DIAGNOSIS — Z7985 Long-term (current) use of injectable non-insulin antidiabetic drugs: Secondary | ICD-10-CM

## 2024-08-31 DIAGNOSIS — M7989 Other specified soft tissue disorders: Secondary | ICD-10-CM | POA: Diagnosis not present

## 2024-08-31 DIAGNOSIS — I890 Lymphedema, not elsewhere classified: Secondary | ICD-10-CM | POA: Diagnosis present

## 2024-08-31 DIAGNOSIS — L0390 Cellulitis, unspecified: Secondary | ICD-10-CM | POA: Diagnosis present

## 2024-08-31 DIAGNOSIS — E1140 Type 2 diabetes mellitus with diabetic neuropathy, unspecified: Secondary | ICD-10-CM | POA: Diagnosis present

## 2024-08-31 DIAGNOSIS — I5022 Chronic systolic (congestive) heart failure: Secondary | ICD-10-CM

## 2024-08-31 LAB — CBC WITH DIFFERENTIAL/PLATELET
Abs Immature Granulocytes: 0.36 K/uL — ABNORMAL HIGH (ref 0.00–0.07)
Basophils Absolute: 0 K/uL (ref 0.0–0.1)
Basophils Relative: 0 %
Eosinophils Absolute: 0 K/uL (ref 0.0–0.5)
Eosinophils Relative: 0 %
HCT: 42.7 % (ref 39.0–52.0)
Hemoglobin: 14.7 g/dL (ref 13.0–17.0)
Immature Granulocytes: 2 %
Lymphocytes Relative: 4 %
Lymphs Abs: 0.7 K/uL (ref 0.7–4.0)
MCH: 30.8 pg (ref 26.0–34.0)
MCHC: 34.4 g/dL (ref 30.0–36.0)
MCV: 89.5 fL (ref 80.0–100.0)
Monocytes Absolute: 0.7 K/uL (ref 0.1–1.0)
Monocytes Relative: 4 %
Neutro Abs: 16.9 K/uL — ABNORMAL HIGH (ref 1.7–7.7)
Neutrophils Relative %: 90 %
Platelets: 235 K/uL (ref 150–400)
RBC: 4.77 MIL/uL (ref 4.22–5.81)
RDW: 14.3 % (ref 11.5–15.5)
WBC: 18.7 K/uL — ABNORMAL HIGH (ref 4.0–10.5)
nRBC: 0 % (ref 0.0–0.2)

## 2024-08-31 LAB — COMPREHENSIVE METABOLIC PANEL WITH GFR
ALT: 14 U/L (ref 0–44)
AST: 60 U/L — ABNORMAL HIGH (ref 15–41)
Albumin: 3.7 g/dL (ref 3.5–5.0)
Alkaline Phosphatase: 67 U/L (ref 38–126)
Anion gap: 12 (ref 5–15)
BUN: 39 mg/dL — ABNORMAL HIGH (ref 8–23)
CO2: 21 mmol/L — ABNORMAL LOW (ref 22–32)
Calcium: 9.7 mg/dL (ref 8.9–10.3)
Chloride: 104 mmol/L (ref 98–111)
Creatinine, Ser: 1.59 mg/dL — ABNORMAL HIGH (ref 0.61–1.24)
GFR, Estimated: 45 mL/min — ABNORMAL LOW
Glucose, Bld: 133 mg/dL — ABNORMAL HIGH (ref 70–99)
Potassium: 4.3 mmol/L (ref 3.5–5.1)
Sodium: 137 mmol/L (ref 135–145)
Total Bilirubin: 1.8 mg/dL — ABNORMAL HIGH (ref 0.0–1.2)
Total Protein: 7 g/dL (ref 6.5–8.1)

## 2024-08-31 LAB — I-STAT CG4 LACTIC ACID, ED
Lactic Acid, Venous: 1.6 mmol/L (ref 0.5–1.9)
Lactic Acid, Venous: 1.8 mmol/L (ref 0.5–1.9)

## 2024-08-31 MED ORDER — MELATONIN 3 MG PO TABS
3.0000 mg | ORAL_TABLET | Freq: Every evening | ORAL | Status: AC | PRN
  Filled 2024-08-31: qty 1

## 2024-08-31 MED ORDER — SODIUM CHLORIDE 0.9 % IV SOLN
2.0000 g | INTRAVENOUS | Status: DC
  Administered 2024-08-31: 2 g via INTRAVENOUS
  Filled 2024-08-31: qty 20

## 2024-08-31 MED ORDER — INSULIN ASPART 100 UNIT/ML IJ SOLN
0.0000 [IU] | Freq: Three times a day (TID) | INTRAMUSCULAR | Status: DC
  Administered 2024-09-02: 1 [IU] via SUBCUTANEOUS
  Administered 2024-09-02 – 2024-09-03 (×2): 2 [IU] via SUBCUTANEOUS
  Administered 2024-09-03: 1 [IU] via SUBCUTANEOUS
  Filled 2024-08-31 (×2): qty 1
  Filled 2024-08-31 (×2): qty 2

## 2024-08-31 MED ORDER — RIVAROXABAN 20 MG PO TABS
20.0000 mg | ORAL_TABLET | Freq: Every day | ORAL | Status: DC
  Administered 2024-09-01 – 2024-09-06 (×6): 20 mg via ORAL
  Filled 2024-08-31 (×7): qty 1

## 2024-08-31 MED ORDER — INSULIN GLARGINE-YFGN 100 UNIT/ML ~~LOC~~ SOLN
12.0000 [IU] | Freq: Every day | SUBCUTANEOUS | Status: DC
  Administered 2024-09-01: 12 [IU] via SUBCUTANEOUS
  Filled 2024-08-31 (×2): qty 0.12

## 2024-08-31 MED ORDER — ACETAMINOPHEN 325 MG PO TABS
650.0000 mg | ORAL_TABLET | Freq: Four times a day (QID) | ORAL | Status: AC | PRN
  Filled 2024-08-31 (×2): qty 2

## 2024-08-31 MED ORDER — METOPROLOL SUCCINATE ER 25 MG PO TB24
50.0000 mg | ORAL_TABLET | Freq: Every day | ORAL | Status: AC
  Administered 2024-09-01 – 2024-09-03 (×3): 50 mg via ORAL
  Filled 2024-08-31: qty 2
  Filled 2024-08-31 (×2): qty 1
  Filled 2024-08-31 (×2): qty 2
  Filled 2024-08-31: qty 1

## 2024-08-31 MED ORDER — ONDANSETRON HCL 4 MG/2ML IJ SOLN: 4.0000 mg | Freq: Four times a day (QID) | INTRAMUSCULAR | Status: DC | PRN

## 2024-08-31 MED ORDER — ATORVASTATIN CALCIUM 40 MG PO TABS
40.0000 mg | ORAL_TABLET | Freq: Every day | ORAL | Status: DC
  Administered 2024-09-01 – 2024-09-06 (×6): 40 mg via ORAL
  Filled 2024-08-31 (×7): qty 1

## 2024-08-31 MED ORDER — EZETIMIBE 10 MG PO TABS
10.0000 mg | ORAL_TABLET | Freq: Every day | ORAL | Status: AC
  Administered 2024-09-01 – 2024-09-06 (×6): 10 mg via ORAL
  Filled 2024-08-31 (×7): qty 1

## 2024-08-31 MED ORDER — ACETAMINOPHEN 650 MG RE SUPP: 650.0000 mg | Freq: Four times a day (QID) | RECTAL | Status: DC | PRN

## 2024-08-31 MED ORDER — LACTATED RINGERS IV SOLN: INTRAVENOUS | Status: AC

## 2024-08-31 MED ORDER — INSULIN ASPART 100 UNIT/ML IJ SOLN: 0.0000 [IU] | Freq: Every day | INTRAMUSCULAR | Status: DC

## 2024-08-31 MED ORDER — LACTATED RINGERS IV BOLUS
500.0000 mL | Freq: Once | INTRAVENOUS | Status: AC
  Administered 2024-08-31: 500 mL via INTRAVENOUS

## 2024-08-31 NOTE — ED Provider Notes (Signed)
 " [place] [facility] Provider Note   CSN: [id number] Arrival date & time: 08/31/24  1951     Patient presents with: Foot Pain   Anthony Case is a 74 y.o. male.   74 year old male presents to the ED for concern of infection in leg.  He has past medical history significant of well-controlled type 2 diabetes, peripheral neuropathy, right foot wounds on big toe being seen for podiatry (healing), history of sepsis 2/2 foot infections, history of DVT in the left leg, on anticoagulation Xarelto , HFrEF.  Stated 1/6 was feeling very poorly and went to the primary care doctor regarding URI.  Chest x-ray showed mild bronchitis.  He was prescribed doxycycline 100 mg and prednisone 10 mg.  Last night he had a fever of 104, wife gave him Tylenol  and cold compress. Yesterday he also scraped his foot and received a paper cut like cut which did not appear infected.  Overnight, his right foot began to swell, was red, warm.  Patient denies any chest pain, abdominal pain, headache, urinary changes, abdominal pain.  Endorsing mild shortness of breath.  Patient stated that his home blood pressures are generally with systolic 90s.   Foot Pain       Prior to Admission medications  Medication Sig Start Date End Date Taking? Authorizing Provider  acetaminophen  (TYLENOL ) 500 MG tablet Take 1,000 mg by mouth every 6 (six) hours as needed for moderate pain.    [provider]  Apremilast (OTEZLA PO) Take by mouth.    [provider]  Ascorbic Acid  (VITAMIN C ) 1000 MG tablet Take 1,000 mg by mouth daily.    [provider]  atorvastatin  (LIPITOR) 20 MG tablet Take 20 mg by mouth daily.    [provider]  augmented betamethasone  dipropionate (DIPROLENE -AF) 0.05 % cream Apply 1 Application topically as needed. 11/16/23   Cheryle Page, MD  cholecalciferol  (VITAMIN D3) 25 MCG (1000 UNIT) tablet Take 1,000 Units by mouth daily.    [provider]  dapagliflozin  propanediol (FARXIGA ) 10 MG TABS tablet Take 10 mg by mouth daily.    [provider]  ezetimibe  (ZETIA ) 10 MG tablet Take 1 tablet (10 mg total) by mouth daily. 06/13/22   Ladona Heinz, MD  folic acid  (FOLVITE ) 1 MG tablet Take 1 mg by mouth daily.    [provider]  gabapentin  (NEURONTIN ) 100 MG capsule Take 100 mg by mouth 3 (three) times daily.    [provider]  glipiZIDE  (GLUCOTROL  XL) 2.5 MG 24 hr tablet Take 5 mg by mouth daily with breakfast. 02/23/19   [provider]  Insulin  Degludec (TRESIBA  FLEXTOUCH [place]) Inject 48 Units into the skin daily.    [provider]  metFORMIN  (GLUCOPHAGE ) 500 MG tablet Take 500 mg by mouth 2 (two) times daily with a meal.    [provider]  metoprolol  succinate (TOPROL -XL) 50 MG 24 hr tablet Take 1 tablet (50 mg total) by mouth daily. 09/09/21   Ladona Heinz, MD  Multiple Vitamin (MULTIVITAMIN WITH MINERALS) TABS tablet Take 1 tablet by mouth daily.    [provider]  Omega-3 Fatty Acids (FISH OIL) 1000 MG CAPS Take 1,000 mg by mouth daily.    [provider]  OZEMPIC, 0.25 OR 0.5 MG/DOSE, 2 MG/3ML SOPN SMARTSIG:0.25 Milligram(s) SUB-Q Once a Week 09/30/23   [provider]  rivaroxaban  (XARELTO ) 20 MG TABS tablet Take 1 tablet (20 mg total) by mouth daily. 08/04/13  last dose of xarelto  in preparation for colonoscopy 08/28/21   Ladona Heinz, MD  sacubitril -valsartan  (ENTRESTO ) 97-103 MG Take 1 tablet by mouth 2 (two) times daily. 07/19/24   Ladona Heinz, MD  Sodium Citrate Dihydrate (EMETROL) 230 MG CHEW Chew 2 tablets by mouth every 15 (fifteen) minutes as needed. (12 time(s) a day as needed to induce vomiting) 03/29/24       Allergies: Ace inhibitors and Lisinopril    Review of Systems  Updated Vital Signs BP (!) 103/59   Pulse 92   Temp 98.1 F (36.7 C) (Oral)   Resp 11   SpO2 95%   Physical Exam Constitutional:      General: He is not in acute  distress.    Appearance: He is not ill-appearing, toxic-appearing or diaphoretic.  Cardiovascular:     Rate and Rhythm: Normal rate and regular rhythm.     Pulses:          Dorsalis pedis pulses are 2+ on the left side.     Heart sounds: Normal heart sounds. No murmur heard.    No friction rub. No gallop.  Pulmonary:     Effort: Pulmonary effort is normal.     Breath sounds: No stridor. No wheezing, rhonchi or rales.  Abdominal:     General: Bowel sounds are normal.     Palpations: Abdomen is soft.     Tenderness: There is no abdominal tenderness. There is no guarding.  Musculoskeletal:     Right lower leg: Tenderness present. Edema present.     Left lower leg: No tenderness. No edema.     Comments: Right leg with erythema, warmth, swelling. Knee with blanching.  Concern for cellulitis streaking with linear, tender, red lines moving up from knee  Feet:     Right foot:     Skin integrity: Ulcer (2 ulcers present on big toe, no erythema, nonpurulent.  Cut on lateral plantar surface with some swelling present.  Nonpurulent.) present.  Skin:    General: Skin is warm and dry.  Neurological:     Mental Status: He is alert.     (all labs ordered are listed, but only abnormal results are displayed) Labs Reviewed  CBC WITH DIFFERENTIAL/PLATELET - Abnormal; Notable for the following components:      Result Value   WBC 18.7 (*)    Neutro Abs 16.9 (*)    Abs Immature Granulocytes 0.36 (*)    All other components within normal limits  CULTURE, BLOOD (ROUTINE X 2)  CULTURE, BLOOD (ROUTINE X 2)  COMPREHENSIVE METABOLIC PANEL WITH GFR  I-STAT CG4 LACTIC ACID, ED    EKG: None  Radiology: DG Foot Complete Right Result Date: 08/31/2024 EXAM: 3 VIEW(S) XRAY OF THE RIGHT FOOT 08/31/2024 09:05:00 PM COMPARISON: Right foot series 11/11/2023. CLINICAL HISTORY: infection infection FINDINGS: BONES AND JOINTS: No acute fracture. No findings of acute osteomyelitis. No new osseous abnormality is  seen. There is a chronic avulsion fracture or accessory ossicle again noted along the base of the 5th metatarsal and an elongate os peroneum also again shown. There is hallux valgus with moderate arthrosis at the 1st MTP joint, spurring along the medial 1st metatarsal head, degenerative features of the toe joints and midfoot, large plantar and moderate dorsal calcaneal spurs. SOFT TISSUES: There are vascular calcifications in the distal foreleg and foot. Increased mild to moderate soft tissue edema is seen greatest in the forefoot. No soft tissue gas is seen. IMPRESSION: 1. Increased mild to moderate soft  tissue edema, greatest in the forefoot. No soft tissue gas or radiographic evidence of acute osteomyelitis. 2. No acute osseous abnormality. Electronically signed by: Francis Quam MD 08/31/2024 09:18 PM EST RP Workstation: [id number]     Procedures   Medications Ordered in the ED - No data to display                                  Medical Decision Making This patient is a 74 y.o. male who presents to the ED for concern of right leg infection, this involves an extensive number of treatment options, and is a complaint that carries with it a high risk of complications and morbidity. The emergent differential diagnosis prior to evaluation includes, but is not limited to, cellulitis, DVT, osteomyelitis. This is not an exhaustive differential.   Past Medical History / Co-morbidities / Social History: well-controlled type 2 diabetes, peripheral neuropathy, right foot wounds on big toe being seen for podiatry (healing), history of sepsis 2/2 foot infections, history of DVT in the left leg, on anticoagulation Xarelto , HFrEF  Additional history: Chart reviewed. 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 Developed on doxycycline ppx likely due to less activity against strep. Treated with ceftriaxone ->keflex and lymphedema care with significant improvement. Given recurrent cellulitis (previously proven strep, likely strep again) will discharge on penicillin ppx.   Physical Exam: Physical exam performed. The pertinent findings include:  Right leg with erythema, warmth, swelling. Knee with blanching.  Concern for cellulitis streaking with linear, tender, red lines moving up from knee  Lab Tests: I ordered, and personally interpreted labs.  The pertinent results include:   - Blood cultures: Ordered - CMP: No anion gap, chloride 104, CO2 21, BUN 39, creatinine 1.59, AST 60, total bilirubin 1.8 - WBC 18.7, neutrophil predominant - Lactic acid: 1.6   Imaging Studies: I ordered imaging studies including and I independently visualized and interpreted imaging - I agree with the radiologist interpretation. Below are the tests ordered and my interpretations: - X ray:  Increased mild to moderate soft tissue edema.  No soft tissue gas  EKG/Cardiac Monitoring: deferred    Medications: I ordered medication including -ceftriaxone  for cellulitis -LR 500 cc bolus for sepsis and AKI -LR 750 cc/h for 10 hours for sepsis and AKI  Consultations Obtained: I requested consultation with the Hospitalist,  and discussed lab and imaging findings as well as pertinent plan - they recommend: Admission   MDM: Patient presents to the ED with concern for right leg infection.   Initial presentation to ED patient was tachycardic and hypotensive- this had resolved without intervention back to patient's baseline (systolic 90s), lactic acid 1.6.  Foot x-ray without evidence of osteomyelitis. Patient has been afebrile and hemodynamically stable since examination. Patient has history of type 2  diabetes that is well-controlled and peripheral neuropathy.  Foot examination is unremarkable aside from healing foot ulcers on big toe and recent on right lateral plantar surface that is not purulent but does have some swelling.  Leukocytosis of 18.7.  CMP significant for AKI with creatinine 1.59 and elevated total bilirubin at 1.8 and AST 60 suggestive of possible sepsis associated  cholestasis.  Presentation is complicated by recent treatment with doxycycline and prednisone for URI that started 08/30/2024 which patient thinks is improved.  Leukocytosis could be 2/2 prednisone use or in setting of infection.  Low suspicion for DVT given patient is adherent to anticoagulation. Given history of sepsis related to leg infections and current presentation with significantly swollen, erythematous, warm right lower extremity with concern of lymphangitis given streaking, requested hospital admission for treatment of cellulitis AKI.  Received 2 g ceftriaxone  in ED and fluids.  I discussed this case with my attending physician Dr. Bari who cosigned this note including patient's presenting symptoms, physical exam, and planned diagnostics and interventions. Attending physician stated agreement with plan or made changes to plan which were implemented.      Risk Prescription drug management. Decision regarding hospitalization.       Final diagnoses:  None    ED Discharge Orders     None          Brailynn Breth, DO 08/31/24 [id number]    Bari Roxie HERO, DO 08/31/24 2348  "

## 2024-08-31 NOTE — ED Triage Notes (Signed)
" °  Pt complaints of right foot pain, blisters, and redness irradiated to ankle and coming up the right leg.   Hx right foot full bone sepsis.   "

## 2024-08-31 NOTE — H&P (Signed)
 " History and Physical      Anthony Case FMW:[id number] DOB: Oct 16, 1950 DOA: 08/31/2024; DOS: 08/31/2024  PCP: Yolande Toribio MATSU, MD  Patient coming from: home   I have personally briefly reviewed patient's old medical records in [facility]  Chief Complaint: Right lower extremity erythema  HPI: Anthony Case is a 74 y.o. male with medical history significant for chronic systolic/diastolic heart failure, moderate aortic stenosis, type 2 diabetes mellitus, recurrent pulmonary emboli, left lower extremity DVT, prior hospitalizations for right lower extremity cellulitis, who is admitted to [facility] on 08/31/2024 with severe sepsis due to right lower extremity cellulitis after presenting from home to [facility] ED complaining of right lower extremity erythema .   The patient reports 1 to 2 days of progressive right foot erythema, tenderness, increased warmth, swelling, with proximal radiation of these features to level proximal to the right ankle.  He notes associated subjective fever, in the absence of any associated chills, fully rigors, or generalized myalgias.  Denies any associated acute focal weakness or any acute focal numbness/paresthesias involving the right lower extremity.  No recent trauma to the right lower extremity.  Denies any associated drainage from the right lower extremity, including no purulent drainage.  He has a history of type 2 diabetes mellitus, notes multiple previous hospitalizations for cellulitis involving the right lower extremity, with most recent such hospitalization reported to have occurred in July 2025.  He reports 5 to 6 days of rhinitis, rhinorrhea, postnasal drip, for which he saw his PCP on 08/30/2024.  PCP was reported to suspect sinusitis, and prescribed prednisone and doxycycline at that time.  The progressive right lower extremity erythema, increased warmth, tenderness, swelling has occurred in spite of good compliance with doxycycline and over  the last 2 days.  The patient reports interval improvement in his rhinitis/rhinorrhea, and denies any associated shortness of breath, wheezing or any recent chest pain or hemoptysis.SABRA  He also has a history of pulmonary embolism in 2009 followed by an additional pulmonary embolism in 2014 at which time he was also diagnosed with DVT of the left lower extremity.  Subsequently, he has been chronically anticoagulated on Xarelto .  Cardiac history notable for chronic systolic/diastolic heart failure, with most recent TTE occurring on 11/13/2023, which was notable for LVEF 40 to 45%, grade 1 diastolic dysfunction, normal right ventricular systolic function, as well as moderate aortic stenosis with aortic valve area 1.40 cm calculated by continuity equation using VTI.  Not on any diuretic medications as an outpatient.     ED Course:  Vital signs in the ED were notable for the following: Afebrile; initial heart rates in the 1 teens, subsequently decreasing to the 80s following initiation of IV fluids; systolic blood pressures in the 90s to low 100s, diastolic blood pressures in the 50s to 60s, with most recent blood pressure noted to be 102/54; respiratory rate 15-19, oxygen saturation 95 to 100% on room air.  Labs were notable for the following: CMP was notable for the following: Sodium 137, potassium 4.3, bicarbonate 21, anion gap 12, creatinine 1.59 compared to most recent prior value of 0.97 on 11/12/2023, BUN De[place]ote ratio greater than 20, glucose 133, AST 60, total bilirubin 1.8, otherwise, liver enzymes are within normal limits.  CBC notable for white cell count 18,700 with 90% neutrophils, hemoglobin 14.7, platelet count 235.  Initial lactic acid 1.6.  Blood cultures x 2 were collected prior to initiation of IV antibiotics.  Per my interpretation, EKG in  ED demonstrated the following: No EKG performed in the ED today.  Imaging in the ED, per corresponding formal radiology read, was notable for the  following: Complete plain films of the right foot showed mild to moderate soft tissue edema, most notably in the forefoot, suggestive of cellulitis, in the absence of any evidence of subcutaneous gas and no radiographic evidence to suggest acute osteomyelitis for any additional evidence of acute bony abnormality.  While in the ED, the following were administered: Rocephin , lactated Ringer 's x 500 cc bolus followed by initiation of continuous LR at 75 cc/h.  Subsequently, the patient was admitted for further evaluation management of presenting severe sepsis due to severe cellulitis of the right lower extremity, with presenting labs also notable for acute kidney injury.     Review of Systems: As per HPI otherwise 10 point review of systems negative.   Past Medical History:  Diagnosis Date   Arthritis    Diabetes mellitus    Hx pulmonary embolism 08/26/2007   required emergent tPA treatment   Hypertension    Psoriatic arthritis (HCC) 09/27/2023   Shortness of breath    Sleep apnea     Past Surgical History:  Procedure Laterality Date   BACK SURGERY     COLONOSCOPY  2009   adenoma polyp   CYSTOSCOPY/URETEROSCOPY/HOLMIUM LASER/STENT PLACEMENT Left 07/02/2021   Procedure: CYSTOSCOPY/RETROGRADE/URETEROSCOPY/HOLMIUM LASER/STENT PLACEMENT;  Surgeon: Nieves Cough, MD;  Location: [facility] ORS;  Service: Urology;  Laterality: Left;   ELBOW ARTHROSCOPY  2010   IR RADIOLOGIST EVAL & MGMT  12/25/2020   IR RADIOLOGIST EVAL & MGMT  12/28/2020   LUMBAR PERCUTANEOUS PEDICLE SCREW 3 LEVEL N/A 05/25/2013   Procedure: Posterior Percutaneous Fixation from Thoracic nine to Lumbar one vertebrae with arthrodesis of Thoracic eleven Fracture;  Surgeon: Victory Gens, MD;  Location: [facility] NEURO ORS;  Service: Neurosurgery;  Laterality: N/A;  Posterior Percutaneous Fixation from Thoracic nine to Lumbar one vertebrae with arthrodesis of Thoracic eleven Fracture   RIGHT/LEFT HEART CATH AND CORONARY ANGIOGRAPHY N/A 08/27/2021    Procedure: RIGHT/LEFT HEART CATH AND CORONARY ANGIOGRAPHY;  Surgeon: Ladona Heinz, MD;  Location: [facility] INVASIVE CV LAB;  Service: Cardiovascular;  Laterality: N/A;   TRANSESOPHAGEAL ECHOCARDIOGRAM (CATH LAB) N/A 11/16/2023   Procedure: TRANSESOPHAGEAL ECHOCARDIOGRAM;  Surgeon: Francyne Headland, MD;  Location: [facility] INVASIVE CV LAB;  Service: Cardiovascular;  Laterality: N/A;    Social History:  reports that he has never smoked. He has never used smokeless tobacco. He reports that he does not currently use alcohol . He reports that he does not use drugs.   Allergies[1]  Family History  Problem Relation Age of Onset   Heart disease Mother    Schizophrenia Mother    Heart disease Father    Diabetes Father    Heart disease Brother      Prior to Admission medications  Medication Sig Start Date End Date Taking? Authorizing Provider  acetaminophen  (TYLENOL ) 500 MG tablet Take 1,000 mg by mouth every 6 (six) hours as needed for moderate pain.    [provider]  Apremilast (OTEZLA PO) Take by mouth.    [provider]  Ascorbic Acid  (VITAMIN C ) 1000 MG tablet Take 1,000 mg by mouth daily.    [provider]  atorvastatin  (LIPITOR) 20 MG tablet Take 20 mg by mouth daily.    [provider]  augmented betamethasone  dipropionate (DIPROLENE -AF) 0.05 % cream Apply 1 Application topically as needed. 11/16/23   Cheryle Page, MD  cholecalciferol  (VITAMIN D3) 25 MCG (  1000 UNIT) tablet Take 1,000 Units by mouth daily.    [provider]  dapagliflozin  propanediol (FARXIGA ) 10 MG TABS tablet Take 10 mg by mouth daily.    [provider]  ezetimibe  (ZETIA ) 10 MG tablet Take 1 tablet (10 mg total) by mouth daily. 06/13/22   Ladona Heinz, MD  folic acid  (FOLVITE ) 1 MG tablet Take 1 mg by mouth daily.    [provider]  gabapentin  (NEURONTIN ) 100 MG capsule Take 100 mg by mouth 3 (three) times daily.    [provider]  glipiZIDE  (GLUCOTROL   XL) 2.5 MG 24 hr tablet Take 5 mg by mouth daily with breakfast. 02/23/19   [provider]  Insulin  Degludec (TRESIBA  FLEXTOUCH [place]) Inject 48 Units into the skin daily.    [provider]  metFORMIN  (GLUCOPHAGE ) 500 MG tablet Take 500 mg by mouth 2 (two) times daily with a meal.    [provider]  metoprolol  succinate (TOPROL -XL) 50 MG 24 hr tablet Take 1 tablet (50 mg total) by mouth daily. 09/09/21   Ladona Heinz, MD  Multiple Vitamin (MULTIVITAMIN WITH MINERALS) TABS tablet Take 1 tablet by mouth daily.    [provider]  Omega-3 Fatty Acids (FISH OIL) 1000 MG CAPS Take 1,000 mg by mouth daily.    [provider]  OZEMPIC, 0.25 OR 0.5 MG/DOSE, 2 MG/3ML SOPN SMARTSIG:0.25 Milligram(s) SUB-Q Once a Week 09/30/23   [provider]  rivaroxaban  (XARELTO ) 20 MG TABS tablet Take 1 tablet (20 mg total) by mouth daily. 08/04/13 last dose of xarelto  in preparation for colonoscopy 08/28/21   Ladona Heinz, MD  sacubitril -valsartan  (ENTRESTO ) 97-103 MG Take 1 tablet by mouth 2 (two) times daily. 07/19/24   Ladona Heinz, MD  Sodium Citrate Dihydrate (EMETROL) 230 MG CHEW Chew 2 tablets by mouth every 15 (fifteen) minutes as needed. (12 time(s) a day as needed to induce vomiting) 03/29/24        Objective    Physical Exam: Vitals:   08/31/24 2215 08/31/24 2245 08/31/24 2300 08/31/24 2325  BP: (!) 90/59 100/60 96/64 (!) 102/54  Pulse: 95 88 89 83  Resp: 16 15 17 19   Temp:      TempSrc:      SpO2: 99% 99% 100% 100%    General: appears to be stated age; alert, oriented Skin: warm, right foot erythema,, swelling, with radiation of these features, in the absence of any associated crepitus; small abrasion over the dorsal surface of the right great toe. Head:  AT/[place] Mouth:  Oral mucosa membranes appear dry, normal dentition Neck: supple; trachea midline Heart:  RRR; did not appreciate any M/R/G Lungs: CTAB, did not appreciate any wheezes, rales, or  rhonchi Abdomen: + BS; soft, ND, NT Extremities:  right foot erythema,, swelling, with radiation of these features, in the absence of any associated crepitus; no muscle wasting ;  small abrasion over the dorsal surface of the right great toe      Labs on Admission: I have personally reviewed following labs and imaging studies  CBC: Recent Labs  Lab 08/31/24 2036  WBC 18.7*  NEUTROABS 16.9*  HGB 14.7  HCT 42.7  MCV 89.5  PLT 235   Basic Metabolic Panel: Recent Labs  Lab 08/31/24 2036  NA 137  K 4.3  CL 104  CO2 21*  GLUCOSE 133*  BUN 39*  CREATININE 1.59*  CALCIUM  9.7   GFR: CrCl cannot be calculated (Unknown ideal weight.). Liver Function Tests: Recent Labs  Lab 08/31/24 2036  AST 60*  ALT 14  ALKPHOS 67  BILITOT 1.8*  PROT 7.0  ALBUMIN 3.7   No results for input(s): LIPASE, AMYLASE in the last 168 hours. No results for input(s): AMMONIA in the last 168 hours. Coagulation Profile: No results for input(s): INR, PROTIME in the last 168 hours. Cardiac Enzymes: No results for input(s): CKTOTAL, CKMB, CKMBINDEX, TROPONINI in the last 168 hours. BNP (last 3 results) No results for input(s): PROBNP in the last 8760 hours. HbA1C: No results for input(s): HGBA1C in the last 72 hours. CBG: No results for input(s): GLUCAP in the last 168 hours. Lipid Profile: No results for input(s): CHOL, HDL, LDLCALC, TRIG, CHOLHDL, LDLDIRECT in the last 72 hours. Thyroid  Function Tests: No results for input(s): TSH, T4TOTAL, FREET4, T3FREE, THYROIDAB in the last 72 hours. Anemia Panel: No results for input(s): VITAMINB12, FOLATE, FERRITIN, TIBC, IRON, RETICCTPCT in the last 72 hours. Urine analysis:    Component Value Date/Time   COLORURINE YELLOW 11/10/2023 2353   APPEARANCEUR HAZY (A) 11/10/2023 2353   LABSPEC 1.037 (H) 11/10/2023 2353   PHURINE 5.0 11/10/2023 2353   GLUCOSEU >=500 (A) 11/10/2023 2353   HGBUR  NEGATIVE 11/10/2023 2353   BILIRUBINUR NEGATIVE 11/10/2023 2353   KETONESUR NEGATIVE 11/10/2023 2353   PROTEINUR NEGATIVE 11/10/2023 2353   UROBILINOGEN 0.2 04/04/2015 2047   NITRITE NEGATIVE 11/10/2023 2353   LEUKOCYTESUR NEGATIVE 11/10/2023 2353    Radiological Exams on Admission: DG Foot Complete Right Result Date: 08/31/2024 EXAM: 3 VIEW(S) XRAY OF THE RIGHT FOOT 08/31/2024 09:05:00 PM COMPARISON: Right foot series 11/11/2023. CLINICAL HISTORY: infection infection FINDINGS: BONES AND JOINTS: No acute fracture. No findings of acute osteomyelitis. No new osseous abnormality is seen. There is a chronic avulsion fracture or accessory ossicle again noted along the base of the 5th metatarsal and an elongate os peroneum also again shown. There is hallux valgus with moderate arthrosis at the 1st MTP joint, spurring along the medial 1st metatarsal head, degenerative features of the toe joints and midfoot, large plantar and moderate dorsal calcaneal spurs. SOFT TISSUES: There are vascular calcifications in the distal foreleg and foot. Increased mild to moderate soft tissue edema is seen greatest in the forefoot. No soft tissue gas is seen. IMPRESSION: 1. Increased mild to moderate soft tissue edema, greatest in the forefoot. No soft tissue gas or radiographic evidence of acute osteomyelitis. 2. No acute osseous abnormality. Electronically signed by: Francis Quam MD 08/31/2024 09:18 PM EST RP Workstation: [id number]      Assessment/Plan   Principal Problem:   Cellulitis of right lower extremity Active Problems:   HLD (hyperlipidemia)   DM2 (diabetes mellitus, type 2) (HCC)   Severe sepsis (HCC)   AKI (acute kidney injury)   Acute prerenal azotemia   Moderate aortic stenosis   Chronic combined systolic and diastolic heart failure (HCC)       #) Severe sepsis due to cellulitis of the right lower extremity: Diagnosis of cellulitis on the basis of 1 to 2 days of progressive right foot  erythema, increased warmth, tenderness, swelling, with proximal radiation of these features and incomes of the right foot showing evidence of mild to moderate subcutaneous edema suggestive of cellulitis, without radiographic evidence of acute osteomyelitis.  Plain films  of the right foot also showed no evidence of subcutaneous gas, and in the absence of crepitus condition appears less suggestive of necrotizing fasciitis.  There is a small abrasion over the dorsal surface of the right great toe, which  may have served as a portal of entry. Additionally, pt's risk factors for cellulitis, for which the patient has a recurrent history, patient's thereof, including reported most recent prior history of right lower extremity cellulitis in July 2025, include his history of type 2 diabetes mellitus complicated by diabetic peripheral polyneuropathy.  No evidence of associated purulent drainage.  SIRS criteria met via leukocytosis with presenting white blood cell count of 18,700 with left shift, associate with 90% neutrophils, along with tachycardia. Lactic acid level: 1.6, followed by 1.8. Of note, given the associated presence of suspected end organ damage in the form of concominant presenting aki, criteria are met for pt's sepsis to be considered severe in nature. However, in the absence of lactic acid level that is greater than or equal to 4.0, and in the absence of any associated hypotension refractory to IVF's, there are no indications for administration of a 30 mL/kg IVF bolus at this time.  In the emergency department this evening, the patient received a 500 cc LR bolus, followed by initiation of continuous LR at 75 cc/h x 10 hours.  For now, we will continue the existing IV fluids, but closely monitor ensuing hemodynamic status as well as close monitoring of ensuing volume status given his increased risk for development of acute on chronic systolic/diastolic heart failure, will also acknowledging his history of  moderate aortic stenosis.  Additional ED work-up/management notable for: Collection of blood cultures x 2 followed by initiation of Rocephin .  In the context of severe sepsis, the patient's cellulitis meets criteria to be considered severe in nature.  The context of severe cellulitis that is nonpurulent, antibiotic stewardship committee recommends broad-spectrum IV antibiotics in the form of IV vancomycin , cefepime .  Consequently, we will escalate antibiotics to include both IV vancomycin  and cefepime  for the will noting that patient cellulitis worsened over the last 1 to 2 days, and spite of outpatient use of doxycycline over that timeframe.  No overt evidence of diabetic foot wound at this time.  Consequently, currently refraining from anaerobic coverage.  No radiographic evidence of acute osteomyelitis.  Will also check ESR, CRP to further evaluate.  There is no additional overt source of underlying infectious process identified at this time, will also check chest x-ray and urinalysis to further evaluate.  Acute DVT felt to be less likely given patient's good compliance with his chronic anticoagulation and Xarelto .  Plan: CBC w/ diff and CMP in AM.  Follow for results of blood cx's x 2. Abx: Start IV vancomycin , cefepime , as above.  Will proceed with lactated Ringer 's at 7 cc/h, as above.  I placed nursing medication order, requesting that right lower extremity located to promote gravity associated drainage.  Check CRP, ESR.  As needed acetaminophen  for fever.  Monitor on telemetry.  As needed acetaminophen  for pain.  Urinalysis, chest x-ray, procalcitonin level.  Check venous ultrasound of the bilateral lower extremities to evaluate for any underlying contributory the patient's history of recurrent thromboembolic processes.                          #) Acute Kidney Injury:   Presenting creatinine 1.59 compared to most recent prior value of 0.97 in March 2025.  Associated with  acute prerenal azotemia.  Suspect prerenal contribution in the setting of ostensible losses leading to new diagnosis of right lower extremity lightest, with suspected additional hydration from intravascular depletion from severe sepsis itself.  Pharmacologic exacerbation in the setting of outpatient  use of Entresto .  Additionally, in the setting of outpatient use of atorvastatin , while noting isolated elevation in AST, will also check CPK level.  Plan: monitor strict I's & O's and daily weights. Attempt to avoid nephrotoxic agents.  Hold home Entresto , gabapentin  for now.  Refrain from NSAIDs. Repeat CMP in the morning. Check serum magnesium  level.  Check urinalysis with microscopy.  Add-on random urine sodium and random urine creatinine.  Add on CPK level.  Further evaluation management of presenting severe sepsis due to right lower extremity cellulitis, as above.  IV fluids, as above.                          #) Type 2 Diabetes Mellitus: documented history of such. Home insulin  regimen: Tresiba  48 units SQ daily. Home oral hypoglycemic agents: Metformin , dapagliflozin , glipizide . presenting blood sugar: 133. Most recent A1c noted to be 6.0% when checked in February 2025.  In terms of the patient's initial dose of basal insulin  to be started during this hospitalization, will be conservative with initial dosing, and closely monitor insulin  fasting a.m. blood sugar to help further guide ensuing dose adjustments to basal insulin .  His history of diabetes complicated by diabetic peripheral polyneuropathy for which she is on gabapentin  as an outpatient.  Plan: accuchecks QAC and HS with low dose SSI.  Tresiba  12 units SQ nightly, first dose now, as above.  Add on hemoglobin A1c level. hold home oral hypoglycemic agents during this hospitalization.  In the setting of presenting acute kidney injury, will hold home gabapentin  for now.                       #) history of  multiple previous pulmonary emboli, DVT: Documented history of pulmonary embolism in 2009 pulmonary embolism/left lower extremity DVT in 2014.  Patient subsequently anticoagulated on Xarelto .  No recent shortness of breath, hemoptysis, increased index of suspicion for new diagnosis of acute pulmonary embolism at this time.  Plan: Continue outpatient Xarelto .  Repeat CBC in the morning.  However, given his history of multiple prior PE/DVT, will with presenting right lower extremity edema, tenderness, increased warmth, swelling, will check venous ultrasound of the bilateral lower extremities to further evaluate for any contributory DVT.                                          #) Hyperlipidemia: documented h/o such. On atorvastatin , Zetia  as outpatient.   Plan: continue home statin and zetia .  Follow for result of CPK level, as above.                             #) Chronic systolic/diastolic heart failure: documented history of such, with most recent TTE performed on 11/13/2023, which was notable for LVEF 40 to 45%, as well as grade 1 diastolic dysfunction, in addition to evidence of moderate aortic stenosis with aortic valve area calculated to be 1.40 cm this calculated by continuity equation using VTI, with additional results as conveyed above. No clinical evidence to suggest acutely decompensated heart failure at this time.  However, will closely follow for results of chest x-ray, which is being ordered as component of evaluation of presenting severe sepsis.  home diuretic regimen reportedly consists of the following: None.  Additionally, we will closely monitoring  same volume status given plan for additional IV fluids in the setting of presenting severe sepsis, AKI. Home cardiac medications also include the following: Entresto , which we held for now in the setting of presenting acute kidney injury, as well as metoprolol  succinate.    Plan: monitor strict I's & O's and daily weights. Repeat CMP in AM. Check serum mag level. Continue home metoprolol  succinate.  Hold home Entresto  for now, as above.  Follow-up result of chest x-ray.  Add on proBNP, followed by proBNP in the morning, as further detailed above.                      # (moderate aortic stenosis: Documented history of such, with most recent TTE performed on 11/13/2023 showing evidence of moderate aortic stenosis, with aortic valve area of four 0 cm calculated via continuity equation using VTI.  No overt evidence of acutely decompensated heart failure at this time, but will check and trend BNP and closely monitor into volume status, as outlined below.  Plan: add-on proBNP.  Repeat proBNP in the morning, as above.  Monitor strict I's and O's and daily weights.  Follow-up result of chest x-ray.      DVT prophylaxis: SCD's + continuation of outpatient Xarelto  Code Status: Full code Family Communication: none Disposition Plan: Per Rounding Team Consults called: none;  Admission status: Inpatient     I SPENT GREATER THAN 75  MINUTES IN CLINICAL CARE TIME/MEDICAL DECISION-MAKING IN COMPLETING THIS ADMISSION.      Tosh Glaze B Jera Headings DO [facility]  From 7PM - 7AM   08/31/2024, 11:36 PM         [1]  Allergies Allergen Reactions   Ace Inhibitors Other (See Comments)   Lisinopril Cough   "

## 2024-08-31 NOTE — ED Triage Notes (Signed)
" °

## 2024-09-01 ENCOUNTER — Inpatient Hospital Stay (HOSPITAL_COMMUNITY)

## 2024-09-01 DIAGNOSIS — M7989 Other specified soft tissue disorders: Secondary | ICD-10-CM | POA: Diagnosis not present

## 2024-09-01 DIAGNOSIS — I350 Nonrheumatic aortic (valve) stenosis: Secondary | ICD-10-CM

## 2024-09-01 DIAGNOSIS — L03115 Cellulitis of right lower limb: Secondary | ICD-10-CM | POA: Diagnosis not present

## 2024-09-01 DIAGNOSIS — M79671 Pain in right foot: Secondary | ICD-10-CM | POA: Diagnosis not present

## 2024-09-01 DIAGNOSIS — N19 Unspecified kidney failure: Secondary | ICD-10-CM | POA: Diagnosis present

## 2024-09-01 DIAGNOSIS — I5042 Chronic combined systolic (congestive) and diastolic (congestive) heart failure: Secondary | ICD-10-CM | POA: Diagnosis present

## 2024-09-01 DIAGNOSIS — N179 Acute kidney failure, unspecified: Secondary | ICD-10-CM | POA: Diagnosis present

## 2024-09-01 LAB — URINALYSIS, COMPLETE (UACMP) WITH MICROSCOPIC
Bacteria, UA: NONE SEEN
Bilirubin Urine: NEGATIVE
Glucose, UA: >=500 mg/dL — AB
Hgb urine dipstick: NEGATIVE
Ketones, ur: 5 mg/dL — AB
Leukocytes,Ua: NEGATIVE
Nitrite: NEGATIVE
Protein, ur: 30 mg/dL — AB
Specific Gravity, Urine: 1.026 (ref 1.005–1.030)
pH: 5 (ref 5.0–8.0)

## 2024-09-01 LAB — CBC WITH DIFFERENTIAL/PLATELET
Abs Immature Granulocytes: 0.26 K/uL — ABNORMAL HIGH (ref 0.00–0.07)
Basophils Absolute: 0 K/uL (ref 0.0–0.1)
Basophils Relative: 0 %
Eosinophils Absolute: 0 K/uL (ref 0.0–0.5)
Eosinophils Relative: 0 %
HCT: 39.7 % (ref 39.0–52.0)
Hemoglobin: 13.3 g/dL (ref 13.0–17.0)
Immature Granulocytes: 2 %
Lymphocytes Relative: 5 %
Lymphs Abs: 0.9 K/uL (ref 0.7–4.0)
MCH: 30.5 pg (ref 26.0–34.0)
MCHC: 33.5 g/dL (ref 30.0–36.0)
MCV: 91.1 fL (ref 80.0–100.0)
Monocytes Absolute: 0.9 K/uL (ref 0.1–1.0)
Monocytes Relative: 5 %
Neutro Abs: 15.3 K/uL — ABNORMAL HIGH (ref 1.7–7.7)
Neutrophils Relative %: 88 %
Platelets: 194 K/uL (ref 150–400)
RBC: 4.36 MIL/uL (ref 4.22–5.81)
RDW: 14.3 % (ref 11.5–15.5)
WBC: 17.4 K/uL — ABNORMAL HIGH (ref 4.0–10.5)
nRBC: 0 % (ref 0.0–0.2)

## 2024-09-01 LAB — PRO BRAIN NATRIURETIC PEPTIDE
Pro Brain Natriuretic Peptide: 3862 pg/mL — ABNORMAL HIGH
Pro Brain Natriuretic Peptide: 1549 pg/mL — ABNORMAL HIGH

## 2024-09-01 LAB — COMPREHENSIVE METABOLIC PANEL WITH GFR
ALT: 26 U/L (ref 0–44)
AST: 57 U/L — ABNORMAL HIGH (ref 15–41)
Albumin: 3.3 g/dL — ABNORMAL LOW (ref 3.5–5.0)
Alkaline Phosphatase: 52 U/L (ref 38–126)
Anion gap: 10 (ref 5–15)
BUN: 40 mg/dL — ABNORMAL HIGH (ref 8–23)
CO2: 23 mmol/L (ref 22–32)
Calcium: 9.2 mg/dL (ref 8.9–10.3)
Chloride: 106 mmol/L (ref 98–111)
Creatinine, Ser: 1.3 mg/dL — ABNORMAL HIGH (ref 0.61–1.24)
GFR, Estimated: 58 mL/min — ABNORMAL LOW
Glucose, Bld: 77 mg/dL (ref 70–99)
Potassium: 4 mmol/L (ref 3.5–5.1)
Sodium: 138 mmol/L (ref 135–145)
Total Bilirubin: 1.3 mg/dL — ABNORMAL HIGH (ref 0.0–1.2)
Total Protein: 6.2 g/dL — ABNORMAL LOW (ref 6.5–8.1)

## 2024-09-01 LAB — SEDIMENTATION RATE: Sed Rate: 44 mm/h — ABNORMAL HIGH (ref 0–16)

## 2024-09-01 LAB — HEMOGLOBIN A1C
Hgb A1c MFr Bld: 5.5 % (ref 4.8–5.6)
Mean Plasma Glucose: 111.15 mg/dL

## 2024-09-01 LAB — PROCALCITONIN: Procalcitonin: 6.5 ng/mL

## 2024-09-01 LAB — CBG MONITORING, ED
Glucose-Capillary: 75 mg/dL (ref 70–99)
Glucose-Capillary: 83 mg/dL (ref 70–99)
Glucose-Capillary: 85 mg/dL (ref 70–99)
Glucose-Capillary: 87 mg/dL (ref 70–99)

## 2024-09-01 LAB — MAGNESIUM
Magnesium: 1.4 mg/dL — ABNORMAL LOW (ref 1.7–2.4)
Magnesium: 1.6 mg/dL — ABNORMAL LOW (ref 1.7–2.4)

## 2024-09-01 LAB — CREATININE, URINE, RANDOM: Creatinine, Urine: 146 mg/dL

## 2024-09-01 LAB — C-REACTIVE PROTEIN: CRP: 23.6 mg/dL — ABNORMAL HIGH

## 2024-09-01 LAB — GLUCOSE, CAPILLARY
Glucose-Capillary: 85 mg/dL (ref 70–99)
Glucose-Capillary: 134 mg/dL — ABNORMAL HIGH (ref 70–99)

## 2024-09-01 LAB — SODIUM, URINE, RANDOM: Sodium, Ur: 86 mmol/L

## 2024-09-01 LAB — CK: Total CK: 510 U/L — ABNORMAL HIGH (ref 49–397)

## 2024-09-01 MED ORDER — VANCOMYCIN HCL 1500 MG/300ML IV SOLN
1500.0000 mg | INTRAVENOUS | Status: DC
  Administered 2024-09-01: 1500 mg via INTRAVENOUS
  Filled 2024-09-01 (×3): qty 300

## 2024-09-01 MED ORDER — VANCOMYCIN HCL 1250 MG/250ML IV SOLN: 1250.0000 mg | INTRAVENOUS | Status: DC

## 2024-09-01 MED ORDER — SODIUM CHLORIDE 0.9 % IV SOLN
2.0000 g | Freq: Two times a day (BID) | INTRAVENOUS | Status: DC
  Administered 2024-09-01 (×2): 2 g via INTRAVENOUS
  Filled 2024-09-01 (×2): qty 12.5

## 2024-09-01 MED ORDER — INSULIN GLARGINE 100 UNIT/ML ~~LOC~~ SOLN
12.0000 [IU] | Freq: Every day | SUBCUTANEOUS | Status: DC
  Administered 2024-09-01 – 2024-09-05 (×5): 12 [IU] via SUBCUTANEOUS
  Filled 2024-09-01 (×6): qty 0.12

## 2024-09-01 MED ORDER — MAGNESIUM SULFATE 4 GM/100ML IV SOLN
4.0000 g | Freq: Once | INTRAVENOUS | Status: AC
  Administered 2024-09-01: 4 g via INTRAVENOUS
  Filled 2024-09-01: qty 100

## 2024-09-01 MED ORDER — VANCOMYCIN HCL 2000 MG/400ML IV SOLN
2000.0000 mg | Freq: Once | INTRAVENOUS | Status: AC
  Administered 2024-09-01: 2000 mg via INTRAVENOUS
  Filled 2024-09-01: qty 400

## 2024-09-01 MED ORDER — SODIUM CHLORIDE 0.9 % IV SOLN
2.0000 g | Freq: Three times a day (TID) | INTRAVENOUS | Status: DC
  Administered 2024-09-01 – 2024-09-02 (×3): 2 g via INTRAVENOUS
  Filled 2024-09-01 (×3): qty 12.5

## 2024-09-01 NOTE — Hospital Course (Signed)
 74 y.o. male with medical history significant for chronic systolic/diastolic heart failure, moderate aortic stenosis, type 2 diabetes mellitus, recurrent pulmonary emboli, left lower extremity DVT, prior hospitalizations for right lower extremity cellulitis, who is admitted to [facility] on 08/31/2024 with severe sepsis due to right lower extremity cellulitis after presenting from home to [facility] ED complaining of right lower extremity erythema .    The patient reports 1 to 2 days of progressive right foot erythema, tenderness, increased warmth, swelling, with proximal radiation of these features to level proximal to the right ankle.  He notes associated subjective fever, in the absence of any associated chills, fully rigors, or generalized myalgias.  Denies any associated acute focal weakness or any acute focal numbness/paresthesias involving the right lower extremity.  No recent trauma to the right lower extremity.  Denies any associated drainage from the right lower extremity, including no purulent drainage.  Significant Events: 08/31/24 - Admitted  09/03/24 - Transferred to Hospital at [facility]

## 2024-09-01 NOTE — ED Notes (Signed)
Floor notified.

## 2024-09-01 NOTE — Progress Notes (Signed)
 Bilateral lower extremity venous duplex has been completed. Preliminary results can be found in CV Proc through chart review.   09/01/2024 2:16 PM Cathlyn Collet RVT

## 2024-09-01 NOTE — Progress Notes (Signed)
 Pt is requesting his medications entresto  and apremilast. Not ordered upon admission. Noted on pt's home meds, 2x/day entresto  97/103 mg and apremilast 30 mg. Lavanda Horns, NP notified. Per Lavanda, would hold entresto  due to soft BP at has and apremilast nonformulary. Will follow-up in am with oncoming staff for follow-up with attending. Pt has medications from home to be administered if needed.

## 2024-09-01 NOTE — Progress Notes (Signed)
" °  Progress Note   Patient: Anthony Case FMW:[id number] DOB: [birth year] DOA: 08/31/2024     1 DOS: the patient was seen and examined on 09/01/2024   Brief hospital course: 74 y.o. male with medical history significant for chronic systolic/diastolic heart failure, moderate aortic stenosis, type 2 diabetes mellitus, recurrent pulmonary emboli, left lower extremity DVT, prior hospitalizations for right lower extremity cellulitis, who is admitted to [facility] on 08/31/2024 with severe sepsis due to right lower extremity cellulitis after presenting from home to [facility] ED complaining of right lower extremity erythema .    The patient reports 1 to 2 days of progressive right foot erythema, tenderness, increased warmth, swelling, with proximal radiation of these features to level proximal to the right ankle.  He notes associated subjective fever, in the absence of any associated chills, fully rigors, or generalized myalgias.  Denies any associated acute focal weakness or any acute focal numbness/paresthesias involving the right lower extremity.  No recent trauma to the right lower extremity.  Denies any associated drainage from the right lower extremity, including no purulent drainage.  Assessment and Plan: #) Severe sepsis due to cellulitis of the right lower extremity:  -had been on doxycycline PTA, finished only 2 days  -Blood cx pending -Agree with empiric vanc with cefepime  -fevers resolved and wbc slowly improving -soft tissue swelling on Xray with no foreign body noted -LE dopplers neg   #) Type 2 Diabetes Mellitus:  -continue with SSI.   -cont Tresiba  12 units SQ nightly, first dose now, as above.   -In the setting of presenting acute kidney injury, will hold home gabapentin  for now.    #) history of multiple previous pulmonary emboli, DVT:  -Continue outpatient Xarelto .    #) Hyperlipidemia: documented h/o such. - -continue home statin and zetia .      #) Chronic systolic/diastolic  heart failure:  -Continue home metoprolol  succinate.   -Stable    # (moderate aortic stenosis:      Subjective: Without complaints. Believes leg feels better today  Physical Exam: Vitals:   09/01/24 1230 09/01/24 1300 09/01/24 1540 09/01/24 1740  BP: 125/69 119/71  102/68  Pulse: 85 87 77 80  Resp: 18 18 (!) 23 17  Temp:   98.2 F (36.8 C) 98.1 F (36.7 C)  TempSrc:   Oral Oral  SpO2: 98% 98% 95% 99%  Weight:      Height:       General exam: Awake, laying in bed, in nad Respiratory system: Normal respiratory effort, no wheezing Cardiovascular system: regular rate, s1, s2 Gastrointestinal system: Soft, nondistended, positive BS Central nervous system: CN2-12 grossly intact, strength intact Extremities: Perfused, no clubbing Skin: RLE swollen and red Psychiatry: Mood normal // no visual hallucinations   Data Reviewed:  Labs reviewed: Na 138, K 4.0, Cr 1.30, WBC 17.4, Hgb 13.3, Plts 194  Family Communication: Pt in room, family not at bedside  Disposition: Status is: Inpatient Remains inpatient appropriate because: severity of illness  Planned Discharge Destination: Home    Author: Garnette Pelt, MD 09/01/2024 7:05 PM  For on call review [email or web address].  "

## 2024-09-01 NOTE — ED Notes (Signed)
 Pt provided with urinal and encouraged to produce urine sample.

## 2024-09-01 NOTE — Progress Notes (Signed)
 Pharmacy Antibiotic Note  Anthony Case is a 74 y.o. male admitted on 08/31/2024 with sepsis/cellulitis.  Pharmacy has been consulted for Vancomycin  dosing. WBC is elevated. Renal dysfunction is noted and accounted for in dosing.  Plan: Vancomycin  1250 mg IV q24h >>>Estimated AUC: 499 Cefepime  per MD Trend WBC, temp, renal function  F/U infectious work-up Drug levels as indicated   Height: 5' 11 (180.3 cm) Weight: 124.7 kg (275 lb) IBW/kg (Calculated) : 75.3  Temp (24hrs), Avg:98.5 F (36.9 C), Min:98.1 F (36.7 C), Max:98.9 F (37.2 C)  Recent Labs  Lab 08/31/24 2036 08/31/24 2313  WBC 18.7*  --   CREATININE 1.59*  --   LATICACIDVEN 1.6 1.8    Estimated Creatinine Clearance: 54.8 mL/min (A) (by C-G formula based on SCr of 1.59 mg/dL (H)).    Allergies[1]  Lynwood Mckusick, PharmD, BCPS Clinical Pharmacist Phone: [phone]      [1]  Allergies Allergen Reactions   Ace Inhibitors Other (See Comments)   Lisinopril Cough

## 2024-09-01 NOTE — Progress Notes (Signed)
" ° °  Brief Progress Note   _____________________________________________________________________________________________________________  Patient Name: Anthony Case Patient DOB: 1951-02-27 Date: @TODAY @      Data: 74 yo male currently awaiting admission to Progressive bed at [facility].    Action: Reached out to Dr. Cindy regarding the potential downgrade of the patient to a telemetry bed.    Response:  Dr. Cindy will update the patient's level of care to reflect telemetry status.   _____________________________________________________________________________________________________________  [facility] RN Expeditor Kayci [place] S Kaula Klenke Please contact us  directly via secure chat (search for [facility]) or by calling us  at [phone] [facility]).  "

## 2024-09-01 NOTE — H&P (Incomplete)
 " History and Physical      Anthony Case FMW:[id number] DOB: 15-Jul-1951 DOA: 08/31/2024; DOS: 08/31/2024  PCP: Yolande Toribio MATSU, MD *** Patient coming from: home ***  I have personally briefly reviewed patient's old medical records in [facility]  Chief Complaint: ***  HPI: Anthony Case is a 74 y.o. male with medical history significant for *** who is admitted to [facility] on 08/31/2024 with *** after presenting from home*** to [facility] ED complaining of ***.    ***       ***   ED Course:  Vital signs in the ED were notable for the following: ***  Labs were notable for the following: ***  Per my interpretation, EKG in ED demonstrated the following:  ***  Imaging in the ED, per corresponding formal radiology read, was notable for the following:  ***  While in the ED, the following were administered: ***  Subsequently, the patient was admitted  ***  ***red    Review of Systems: As per HPI otherwise 10 point review of systems negative.   Past Medical History:  Diagnosis Date   Arthritis    Diabetes mellitus    Hx pulmonary embolism 08/26/2007   required emergent tPA treatment   Hypertension    Psoriatic arthritis (HCC) 09/27/2023   Shortness of breath    Sleep apnea     Past Surgical History:  Procedure Laterality Date   BACK SURGERY     COLONOSCOPY  2009   adenoma polyp   CYSTOSCOPY/URETEROSCOPY/HOLMIUM LASER/STENT PLACEMENT Left 07/02/2021   Procedure: CYSTOSCOPY/RETROGRADE/URETEROSCOPY/HOLMIUM LASER/STENT PLACEMENT;  Surgeon: Nieves Cough, MD;  Location: [facility] ORS;  Service: Urology;  Laterality: Left;   ELBOW ARTHROSCOPY  2010   IR RADIOLOGIST EVAL & MGMT  12/25/2020   IR RADIOLOGIST EVAL & MGMT  12/28/2020   LUMBAR PERCUTANEOUS PEDICLE SCREW 3 LEVEL N/A 05/25/2013   Procedure: Posterior Percutaneous Fixation from Thoracic nine to Lumbar one vertebrae with arthrodesis of Thoracic eleven Fracture;  Surgeon: Victory Gens,  MD;  Location: [facility] NEURO ORS;  Service: Neurosurgery;  Laterality: N/A;  Posterior Percutaneous Fixation from Thoracic nine to Lumbar one vertebrae with arthrodesis of Thoracic eleven Fracture   RIGHT/LEFT HEART CATH AND CORONARY ANGIOGRAPHY N/A 08/27/2021   Procedure: RIGHT/LEFT HEART CATH AND CORONARY ANGIOGRAPHY;  Surgeon: Ladona Heinz, MD;  Location: [facility] INVASIVE CV LAB;  Service: Cardiovascular;  Laterality: N/A;   TRANSESOPHAGEAL ECHOCARDIOGRAM (CATH LAB) N/A 11/16/2023   Procedure: TRANSESOPHAGEAL ECHOCARDIOGRAM;  Surgeon: Francyne Headland, MD;  Location: [facility] INVASIVE CV LAB;  Service: Cardiovascular;  Laterality: N/A;    Social History:  reports that he has never smoked. He has never used smokeless tobacco. He reports that he does not currently use alcohol . He reports that he does not use drugs.   Allergies[1]  Family History  Problem Relation Age of Onset   Heart disease Mother    Schizophrenia Mother    Heart disease Father    Diabetes Father    Heart disease Brother     Family history reviewed and not pertinent ***   Prior to Admission medications  Medication Sig Start Date End Date Taking? Authorizing Provider  acetaminophen  (TYLENOL ) 500 MG tablet Take 1,000 mg by mouth every 6 (six) hours as needed for moderate pain.    [provider]  Apremilast (OTEZLA PO) Take by mouth.    [provider]  Ascorbic Acid  (VITAMIN C ) 1000 MG tablet Take 1,000 mg by mouth daily.    [provider]  atorvastatin  (LIPITOR) 20 MG tablet Take 20 mg by mouth daily.    [provider]  augmented betamethasone  dipropionate (DIPROLENE -AF) 0.05 % cream Apply 1 Application topically as needed. 11/16/23   Cheryle Page, MD  cholecalciferol  (VITAMIN D3) 25 MCG (1000 UNIT) tablet Take 1,000 Units by mouth daily.    [provider]  dapagliflozin  propanediol (FARXIGA ) 10 MG TABS tablet Take 10 mg by mouth daily.    [provider]  ezetimibe   (ZETIA ) 10 MG tablet Take 1 tablet (10 mg total) by mouth daily. 06/13/22   Ladona Heinz, MD  folic acid  (FOLVITE ) 1 MG tablet Take 1 mg by mouth daily.    [provider]  gabapentin  (NEURONTIN ) 100 MG capsule Take 100 mg by mouth 3 (three) times daily.    [provider]  glipiZIDE  (GLUCOTROL  XL) 2.5 MG 24 hr tablet Take 5 mg by mouth daily with breakfast. 02/23/19   [provider]  Insulin  Degludec (TRESIBA  FLEXTOUCH [place]) Inject 48 Units into the skin daily.    [provider]  metFORMIN  (GLUCOPHAGE ) 500 MG tablet Take 500 mg by mouth 2 (two) times daily with a meal.    [provider]  metoprolol  succinate (TOPROL -XL) 50 MG 24 hr tablet Take 1 tablet (50 mg total) by mouth daily. 09/09/21   Ladona Heinz, MD  Multiple Vitamin (MULTIVITAMIN WITH MINERALS) TABS tablet Take 1 tablet by mouth daily.    [provider]  Omega-3 Fatty Acids (FISH OIL) 1000 MG CAPS Take 1,000 mg by mouth daily.    [provider]  OZEMPIC, 0.25 OR 0.5 MG/DOSE, 2 MG/3ML SOPN SMARTSIG:0.25 Milligram(s) SUB-Q Once a Week 09/30/23   [provider]  rivaroxaban  (XARELTO ) 20 MG TABS tablet Take 1 tablet (20 mg total) by mouth daily. 08/04/13 last dose of xarelto  in preparation for colonoscopy 08/28/21   Ladona Heinz, MD  sacubitril -valsartan  (ENTRESTO ) 97-103 MG Take 1 tablet by mouth 2 (two) times daily. 07/19/24   Ladona Heinz, MD  Sodium Citrate Dihydrate (EMETROL) 230 MG CHEW Chew 2 tablets by mouth every 15 (fifteen) minutes as needed. (12 time(s) a day as needed to induce vomiting) 03/29/24        Objective    Physical Exam: Vitals:   08/31/24 2215 08/31/24 2245 08/31/24 2300 08/31/24 2325  BP: (!) 90/59 100/60 96/64 (!) 102/54  Pulse: 95 88 89 83  Resp: 16 15 17 19   Temp:      TempSrc:      SpO2: 99% 99% 100% 100%    General: appears to be stated age; alert, oriented Skin: warm, dry, no rash Head:  AT/[place] Mouth:  Oral mucosa membranes appear moist,  normal dentition Neck: supple; trachea midline Heart:  RRR; did not appreciate any M/R/G Lungs: CTAB, did not appreciate any wheezes, rales, or rhonchi Abdomen: + BS; soft, ND, NT Vascular: 2+ pedal pulses b/l; 2+ radial pulses b/l Extremities: no peripheral edema, no muscle wasting   ***   *** Neuro: strength and sensation intact in upper and lower extremities b/l  *** Neuro: 5/5 strength of the proximal and distal flexors and extensors of the upper and lower extremities bilaterally; sensation intact in upper and lower extremities b/l; cranial nerves II through XII grossly intact; no pronator drift; no evidence suggestive of slurred speech, dysarthria, or facial droop; Normal muscle tone. No tremors. *** Neuro: In the setting of the patient's current mental status and associated inability to follow instructions, unable to perform  full neurologic exam at this time.  As such, assessment of strength, sensation, and cranial nerves is limited at this time. Patient noted to spontaneously move all 4 extremities. No tremors.  ***        Labs on Admission: I have personally reviewed following labs and imaging studies  CBC: Recent Labs  Lab 08/31/24 2036  WBC 18.7*  NEUTROABS 16.9*  HGB 14.7  HCT 42.7  MCV 89.5  PLT 235   Basic Metabolic Panel: Recent Labs  Lab 08/31/24 2036  NA 137  K 4.3  CL 104  CO2 21*  GLUCOSE 133*  BUN 39*  CREATININE 1.59*  CALCIUM  9.7   GFR: CrCl cannot be calculated (Unknown ideal weight.). Liver Function Tests: Recent Labs  Lab 08/31/24 2036  AST 60*  ALT 14  ALKPHOS 67  BILITOT 1.8*  PROT 7.0  ALBUMIN 3.7   No results for input(s): LIPASE, AMYLASE in the last 168 hours. No results for input(s): AMMONIA in the last 168 hours. Coagulation Profile: No results for input(s): INR, PROTIME in the last 168 hours. Cardiac Enzymes: No results for input(s): CKTOTAL, CKMB, CKMBINDEX, TROPONINI in the last 168 hours. BNP  (last 3 results) No results for input(s): PROBNP in the last 8760 hours. HbA1C: No results for input(s): HGBA1C in the last 72 hours. CBG: No results for input(s): GLUCAP in the last 168 hours. Lipid Profile: No results for input(s): CHOL, HDL, LDLCALC, TRIG, CHOLHDL, LDLDIRECT in the last 72 hours. Thyroid  Function Tests: No results for input(s): TSH, T4TOTAL, FREET4, T3FREE, THYROIDAB in the last 72 hours. Anemia Panel: No results for input(s): VITAMINB12, FOLATE, FERRITIN, TIBC, IRON, RETICCTPCT in the last 72 hours. Urine analysis:    Component Value Date/Time   COLORURINE YELLOW 11/10/2023 2353   APPEARANCEUR HAZY (A) 11/10/2023 2353   LABSPEC 1.037 (H) 11/10/2023 2353   PHURINE 5.0 11/10/2023 2353   GLUCOSEU >=500 (A) 11/10/2023 2353   HGBUR NEGATIVE 11/10/2023 2353   BILIRUBINUR NEGATIVE 11/10/2023 2353   KETONESUR NEGATIVE 11/10/2023 2353   PROTEINUR NEGATIVE 11/10/2023 2353   UROBILINOGEN 0.2 04/04/2015 2047   NITRITE NEGATIVE 11/10/2023 2353   LEUKOCYTESUR NEGATIVE 11/10/2023 2353    Radiological Exams on Admission: DG Foot Complete Right Result Date: 08/31/2024 EXAM: 3 VIEW(S) XRAY OF THE RIGHT FOOT 08/31/2024 09:05:00 PM COMPARISON: Right foot series 11/11/2023. CLINICAL HISTORY: infection infection FINDINGS: BONES AND JOINTS: No acute fracture. No findings of acute osteomyelitis. No new osseous abnormality is seen. There is a chronic avulsion fracture or accessory ossicle again noted along the base of the 5th metatarsal and an elongate os peroneum also again shown. There is hallux valgus with moderate arthrosis at the 1st MTP joint, spurring along the medial 1st metatarsal head, degenerative features of the toe joints and midfoot, large plantar and moderate dorsal calcaneal spurs. SOFT TISSUES: There are vascular calcifications in the distal foreleg and foot. Increased mild to moderate soft tissue edema is seen greatest in the  forefoot. No soft tissue gas is seen. IMPRESSION: 1. Increased mild to moderate soft tissue edema, greatest in the forefoot. No soft tissue gas or radiographic evidence of acute osteomyelitis. 2. No acute osseous abnormality. Electronically signed by: Francis Quam MD 08/31/2024 09:18 PM EST RP Workstation: [id number]      Assessment/Plan   Principal Problem:   Cellulitis of right lower extremity   ***            ***                     ***                      ***                     ***                     ***                     ***                      ***                     ***                     ***                     ***                     ***                    ***                   ***  DVT prophylaxis: SCD's ***  Code Status: Full code*** Family Communication: none*** Disposition Plan: Per Rounding Team Consults called: none***;  Admission status: ***     I SPENT GREATER THAN 75 *** MINUTES IN CLINICAL CARE TIME/MEDICAL DECISION-MAKING IN COMPLETING THIS ADMISSION.      Eva NOVAK Gaylan Fauver DO [facility]  From 7PM - 7AM   08/31/2024, 11:36 PM   ***        [1] Allergies Allergen Reactions   Ace Inhibitors Other (See Comments)   Lisinopril Cough  "

## 2024-09-01 NOTE — Progress Notes (Addendum)
 PHARMACY NOTE:  ANTIMICROBIAL RENAL DOSAGE ADJUSTMENT  Anthony Case is a 74 y.o. male admitted on 08/31/2024 with sepsis/cellulitis.  Pharmacy has been consulted for Vancomycin  dosing.   Current antimicrobial regimen includes a mismatch between antimicrobial dosage and estimated renal function.  As per policy approved by the Pharmacy & Therapeutics and Medical Executive Committees, the antimicrobial dosage will be adjusted accordingly.   Current antimicrobial dosage:   -cefepime  2g IV q12h -vancomycin  1250mg  IV q24h  Indication: sepsis/cellulitis  Renal Function:improving Scr 1.59 > 1.3  Estimated Creatinine Clearance: 67.1 mL/min (A) (by C-G formula based on SCr of 1.3 mg/dL (H)). []      On intermittent HD, scheduled: []      On CRRT    Antimicrobial dosage has been changed to:   -cefepime  2g IV q8h -vancomycin  1500mg  IV q24h  Additional comments: Continue to monitor renal function for further adjustments as needed   Thank you for allowing pharmacy to be a part of this patient's care.  Elma Fail, PharmD PGY1 Clinical Pharmacist [facility]  09/01/2024 10:42 AM

## 2024-09-02 DIAGNOSIS — L03115 Cellulitis of right lower limb: Secondary | ICD-10-CM | POA: Diagnosis not present

## 2024-09-02 DIAGNOSIS — M79671 Pain in right foot: Secondary | ICD-10-CM | POA: Diagnosis not present

## 2024-09-02 LAB — MAGNESIUM: Magnesium: 2.1 mg/dL (ref 1.7–2.4)

## 2024-09-02 LAB — GLUCOSE, CAPILLARY
Glucose-Capillary: 134 mg/dL — ABNORMAL HIGH (ref 70–99)
Glucose-Capillary: 108 mg/dL — ABNORMAL HIGH (ref 70–99)
Glucose-Capillary: 153 mg/dL — ABNORMAL HIGH (ref 70–99)
Glucose-Capillary: 192 mg/dL — ABNORMAL HIGH (ref 70–99)

## 2024-09-02 LAB — COMPREHENSIVE METABOLIC PANEL WITH GFR
ALT: 32 U/L (ref 0–44)
AST: 70 U/L — ABNORMAL HIGH (ref 15–41)
Albumin: 2.8 g/dL — ABNORMAL LOW (ref 3.5–5.0)
Alkaline Phosphatase: 60 U/L (ref 38–126)
Anion gap: 9 (ref 5–15)
BUN: 32 mg/dL — ABNORMAL HIGH (ref 8–23)
CO2: 19 mmol/L — ABNORMAL LOW (ref 22–32)
Calcium: 8.9 mg/dL (ref 8.9–10.3)
Chloride: 109 mmol/L (ref 98–111)
Creatinine, Ser: 1.11 mg/dL (ref 0.61–1.24)
GFR, Estimated: >60 mL/min
Glucose, Bld: 93 mg/dL (ref 70–99)
Potassium: 4 mmol/L (ref 3.5–5.1)
Sodium: 137 mmol/L (ref 135–145)
Total Bilirubin: 1.2 mg/dL (ref 0.0–1.2)
Total Protein: 5.7 g/dL — ABNORMAL LOW (ref 6.5–8.1)

## 2024-09-02 LAB — CBC
HCT: 38.3 % — ABNORMAL LOW (ref 39.0–52.0)
Hemoglobin: 12.7 g/dL — ABNORMAL LOW (ref 13.0–17.0)
MCH: 30 pg (ref 26.0–34.0)
MCHC: 33.2 g/dL (ref 30.0–36.0)
MCV: 90.5 fL (ref 80.0–100.0)
Platelets: 184 K/uL (ref 150–400)
RBC: 4.23 MIL/uL (ref 4.22–5.81)
RDW: 14.3 % (ref 11.5–15.5)
WBC: 8.9 K/uL (ref 4.0–10.5)
nRBC: 0 % (ref 0.0–0.2)

## 2024-09-02 MED ORDER — CEFAZOLIN SODIUM-DEXTROSE 2-4 GM/100ML-% IV SOLN
2.0000 g | Freq: Three times a day (TID) | INTRAVENOUS | Status: DC
  Administered 2024-09-02 – 2024-09-03 (×3): 2 g via INTRAVENOUS
  Filled 2024-09-02 (×3): qty 100

## 2024-09-02 MED ORDER — SACUBITRIL-VALSARTAN 97-103 MG PO TABS
1.0000 | ORAL_TABLET | Freq: Two times a day (BID) | ORAL | Status: DC
  Administered 2024-09-02 – 2024-09-05 (×8): 1 via ORAL
  Filled 2024-09-02 (×13): qty 1

## 2024-09-02 NOTE — Plan of Care (Signed)
  Problem: Education: Goal: Ability to describe self-care measures that may prevent or decrease complications (Diabetes Survival Skills Education) will improve Outcome: Progressing Goal: Individualized Educational Video(s) Outcome: Progressing   Problem: Coping: Goal: Ability to adjust to condition or change in health will improve Outcome: Progressing   Problem: Fluid Volume: Goal: Ability to maintain a balanced intake and output will improve Outcome: Progressing   Problem: Health Behavior/Discharge Planning: Goal: Ability to identify and utilize available resources and services will improve Outcome: Progressing Goal: Ability to manage health-related needs will improve Outcome: Progressing   Problem: Metabolic: Goal: Ability to maintain appropriate glucose levels will improve Outcome: Progressing   Problem: Nutritional: Goal: Maintenance of adequate nutrition will improve Outcome: Progressing Goal: Progress toward achieving an optimal weight will improve Outcome: Progressing   Problem: Skin Integrity: Goal: Risk for impaired skin integrity will decrease Outcome: Progressing   Problem: Tissue Perfusion: Goal: Adequacy of tissue perfusion will improve Outcome: Progressing   Problem: Education: Goal: Knowledge of General Education information will improve Description: Including pain rating scale, medication(s)/side effects and non-pharmacologic comfort measures Outcome: Progressing   Problem: Health Behavior/Discharge Planning: Goal: Ability to manage health-related needs will improve Outcome: Progressing   Problem: Clinical Measurements: Goal: Ability to maintain clinical measurements within normal limits will improve Outcome: Progressing Goal: Will remain free from infection Outcome: Progressing Goal: Diagnostic test results will improve Outcome: Progressing Goal: Respiratory complications will improve Outcome: Progressing Goal: Cardiovascular complication will  be avoided Outcome: Progressing   Problem: Nutrition: Goal: Adequate nutrition will be maintained Outcome: Progressing   Problem: Coping: Goal: Level of anxiety will decrease Outcome: Progressing

## 2024-09-02 NOTE — Plan of Care (Signed)
  Problem: Coping: Goal: Ability to adjust to condition or change in health will improve Outcome: Progressing   Problem: Fluid Volume: Goal: Ability to maintain a balanced intake and output will improve Outcome: Progressing   Problem: Health Behavior/Discharge Planning: Goal: Ability to manage health-related needs will improve Outcome: Progressing

## 2024-09-02 NOTE — Progress Notes (Signed)
" °  Progress Note   Patient: Anthony Case FMW:[id number] DOB: [birth year] DOA: 08/31/2024     2 DOS: the patient was seen and examined on 09/02/2024   Brief hospital course: 74 y.o. male with medical history significant for chronic systolic/diastolic heart failure, moderate aortic stenosis, type 2 diabetes mellitus, recurrent pulmonary emboli, left lower extremity DVT, prior hospitalizations for right lower extremity cellulitis, who is admitted to [facility] on 08/31/2024 with severe sepsis due to right lower extremity cellulitis after presenting from home to [facility] ED complaining of right lower extremity erythema .    The patient reports 1 to 2 days of progressive right foot erythema, tenderness, increased warmth, swelling, with proximal radiation of these features to level proximal to the right ankle.  He notes associated subjective fever, in the absence of any associated chills, fully rigors, or generalized myalgias.  Denies any associated acute focal weakness or any acute focal numbness/paresthesias involving the right lower extremity.  No recent trauma to the right lower extremity.  Denies any associated drainage from the right lower extremity, including no purulent drainage.  Assessment and Plan: #) Severe sepsis due to cellulitis of the right lower extremity:  -had been on doxycycline PTA, finished only 2 days  -Blood cx neg thus far -Pt was continued with empiric vanc with cefepime  -fevers resolved and wbc normalized -soft tissue swelling on Xray with no foreign body noted -LE dopplers neg -Will narrow coverage to ancef  to cover moderate nonpurulent cellulitis   #) Type 2 Diabetes Mellitus:  -continue with SSI.   -cont Tresiba  12 units SQ nightly, first dose now, as above.   -In the setting of presenting acute kidney injury, will hold home gabapentin  for now.    #) history of multiple previous pulmonary emboli, DVT:  -Continue outpatient Xarelto .    #) Hyperlipidemia: documented  h/o such. - -continue home statin and zetia .      #) Chronic systolic/diastolic heart failure:  -Continue home metoprolol  succinate.   -Stable    # )moderate aortic stenosis:  Seems stable at this time     Subjective: LLE swelling is improving  Physical Exam: Vitals:   09/02/24 0028 09/02/24 0500 09/02/24 0545 09/02/24 1044  BP: 112/72  (!) 103/47 126/66  Pulse: 71  66 62  Resp:    17  Temp: 99.4 F (37.4 C)  99.2 F (37.3 C) 97.8 F (36.6 C)  TempSrc: Oral  Oral Oral  SpO2: 97%  96% 96%  Weight:  115.6 kg    Height:       General exam: Conversant, in no acute distress Respiratory system: normal chest rise, clear, no audible wheezing Cardiovascular system: regular rhythm, s1-s2 Gastrointestinal system: Nondistended, nontender, pos BS Central nervous system: No seizures, no tremors Extremities: No cyanosis, no joint deformities Skin: RLE with improving swelling and erythema Psychiatry: Affect normal // no auditory hallucinations   Data Reviewed:  Labs reviewed: Na 137, K 4.0, Cr 1.11, WBC 8.9, Hgb 12.7, Plts 184  Family Communication: Pt in room, family not at bedside  Disposition: Status is: Inpatient Remains inpatient appropriate because: severity of illness  Planned Discharge Destination: Home    Author: Garnette Pelt, MD 09/02/2024 5:15 PM  For on call review [email or web address].  "

## 2024-09-03 DIAGNOSIS — M79671 Pain in right foot: Secondary | ICD-10-CM | POA: Diagnosis not present

## 2024-09-03 DIAGNOSIS — L03115 Cellulitis of right lower limb: Secondary | ICD-10-CM | POA: Diagnosis not present

## 2024-09-03 LAB — COMPREHENSIVE METABOLIC PANEL WITH GFR
ALT: 42 U/L (ref 0–44)
AST: 60 U/L — ABNORMAL HIGH (ref 15–41)
Albumin: 2.9 g/dL — ABNORMAL LOW (ref 3.5–5.0)
Alkaline Phosphatase: 63 U/L (ref 38–126)
Anion gap: 9 (ref 5–15)
BUN: 22 mg/dL (ref 8–23)
CO2: 19 mmol/L — ABNORMAL LOW (ref 22–32)
Calcium: 9 mg/dL (ref 8.9–10.3)
Chloride: 108 mmol/L (ref 98–111)
Creatinine, Ser: 0.85 mg/dL (ref 0.61–1.24)
GFR, Estimated: >60 mL/min
Glucose, Bld: 138 mg/dL — ABNORMAL HIGH (ref 70–99)
Potassium: 4.1 mmol/L (ref 3.5–5.1)
Sodium: 136 mmol/L (ref 135–145)
Total Bilirubin: 0.9 mg/dL (ref 0.0–1.2)
Total Protein: 5.8 g/dL — ABNORMAL LOW (ref 6.5–8.1)

## 2024-09-03 LAB — CBC
HCT: 38.2 % — ABNORMAL LOW (ref 39.0–52.0)
Hemoglobin: 13.1 g/dL (ref 13.0–17.0)
MCH: 30.5 pg (ref 26.0–34.0)
MCHC: 34.3 g/dL (ref 30.0–36.0)
MCV: 88.8 fL (ref 80.0–100.0)
Platelets: 207 K/uL (ref 150–400)
RBC: 4.3 MIL/uL (ref 4.22–5.81)
RDW: 13.9 % (ref 11.5–15.5)
WBC: 6.8 K/uL (ref 4.0–10.5)
nRBC: 0 % (ref 0.0–0.2)

## 2024-09-03 LAB — GLUCOSE, CAPILLARY
Glucose-Capillary: 127 mg/dL — ABNORMAL HIGH (ref 70–99)
Glucose-Capillary: 134 mg/dL — ABNORMAL HIGH (ref 70–99)
Glucose-Capillary: 163 mg/dL — ABNORMAL HIGH (ref 70–99)
Glucose-Capillary: 173 mg/dL — ABNORMAL HIGH (ref 70–99)

## 2024-09-03 MED ORDER — CEFAZOLIN SODIUM-DEXTROSE 3-4 GM/150ML-% IV SOLN
3.0000 g | Freq: Two times a day (BID) | INTRAVENOUS | Status: DC
  Administered 2024-09-03 – 2024-09-06 (×6): 3 g via INTRAVENOUS
  Filled 2024-09-03 (×9): qty 150

## 2024-09-03 MED ORDER — LINEZOLID 600 MG/300ML IV SOLN
600.0000 mg | Freq: Two times a day (BID) | INTRAVENOUS | Status: DC
  Filled 2024-09-03: qty 300

## 2024-09-03 NOTE — Assessment & Plan Note (Signed)
 2D ECHO 10/2023 w/ EF 40-45% and grade 2 diastolic dysfunction  Appears euvolemic at present  Cont home regimen including entresto   Monitor

## 2024-09-03 NOTE — Assessment & Plan Note (Addendum)
 Baseline psoriatic arthritis  No longer on methotrexate in setting of recent recurrent LE cellulitis  On Otezla per 03/2024 rheumatology progress note  Holding Otezla in setting of infection  Follow up with Rheumatology on when to resume Otezla --Note pt having recurrent episodes of cellulitis, on immunosuppressive therapy.

## 2024-09-03 NOTE — Progress Notes (Signed)
 Virtual assessment delayed until ipad can charge. Spoke with pt via phone, he is aware how to reach on telephone. Vitals are importing through current health.

## 2024-09-03 NOTE — Plan of Care (Addendum)
 " Hospital at Home Interim Note    Anthony Case   FMW:[id number]  DOB: 03/23/1951  DOA: 08/31/2024     3 Date of Service: 09/03/2024  Brief Hospital Course:  74 y.o. male with medical history significant for chronic systolic/diastolic heart failure, moderate aortic stenosis, type 2 diabetes mellitus, recurrent pulmonary emboli, left lower extremity DVT, prior hospitalizations for right lower extremity cellulitis, who is admitted to [facility] on 08/31/2024 with severe sepsis due to right lower extremity cellulitis after presenting from home to [facility] ED complaining of right lower extremity erythema . Noted admission 02/2024 in [place] [facility] [place]  system for sepsis 2/2 RLE purulent cellulitis. Felt to be secondary ot secondary immunosuppression, and baseline diabetes with outdoor travel. Also w/ admissions 10/2023 and 08/2023 for similar issues including group B strep bacteremia.    The patient reports 1 to 2 days of progressive right foot erythema, tenderness, increased warmth, swelling, with proximal radiation of these features to level proximal to the right ankle.  He notes associated subjective fever, in the absence of any associated chills, fully rigors, or generalized myalgias.  Denies any associated acute focal weakness or any acute focal numbness/paresthesias involving the right lower extremity.  No recent trauma to the right lower extremity.  Denies any associated drainage from the right lower extremity, including no purulent drainage. After discussion with patient at the bedside, pt is agreeable to the hospital at home program.    Subjective:  Mild improvement in RLE swelling though w/ persistent redness and swelling  Hospital Problems Assessment and Plan: Cellulitis Sepsis  Initially met sepsis criteria with HR 100s, RR 20s, WBC 20-now resolved  Noted recurrent RLE cellulitis w/ prior history strep bacteremia assd with type 2 DM and outdoor travel 09/2023  Most recent admission  02/2024  No reported recent trauma  LE u/s and plain films grossly stable on admission  Narrowed to cefazolin  w/ clinical improvement  Will continue additional IV dosing x2-3 days  Consider ID input as appropriate (was due for ID follow up during 02/2024 admission that was not done)    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) Baseline insulin  dependent type 2 diabetes  On lantus  40 units at home regularly  Transitions to Lantus  12 units while in house  Blood sugars mainly 190s-100s  A1C 5.5 08/2024  Cont long acting insulin   CGM in place  Monitor blood sugars  Titrate long acting insulin  as appropriate   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 Vitals:   09/02/24 1838 09/02/24 2046 09/03/24 0546 09/03/24 0931  BP: 134/61 (!) 145/75 128/69 127/62  Pulse: 64 62 68 66  Temp: 98 F (36.7 C) 98.4 F (36.9 C) 98.3 F (36.8 C) 98.2 F (36.8 C)  Resp: 18 17 19 17   Height:      Weight:      SpO2: 98% 98% 97% 95%  TempSrc: Oral Oral Oral  Oral  BMI (Calculated):        Exam Physical Exam Constitutional:      Appearance: He is normal weight.  HENT:     Head: Normocephalic and atraumatic.     Nose: Nose normal.     Mouth/Throat:     Mouth: Mucous membranes are moist.  Eyes:     Pupils: Pupils are equal, round, and reactive to light.  Cardiovascular:     Rate and Rhythm: Normal rate and  regular rhythm.  Pulmonary:     Effort: Pulmonary effort is normal.  Abdominal:     General: Bowel sounds are normal.  Musculoskeletal:        General: Normal range of motion.     Right lower leg: Edema present.  Skin:    Findings: Erythema present.  Neurological:     General: No focal deficit present.  Psychiatric:        Mood and Affect: Mood normal.      Labs / Other Information There are no new results to review at this time. Lab Results  Component Value Date   WBC 6.8 09/03/2024   HGB 13.1 09/03/2024   HCT 38.2 (L) 09/03/2024   MCV 88.8 09/03/2024   PLT 207 09/03/2024   Last metabolic panel Lab Results  Component Value Date   GLUCOSE 138 (H) 09/03/2024   NA 136 09/03/2024   K 4.1 09/03/2024   CL 108 09/03/2024   CO2 19 (L) 09/03/2024   BUN 22 09/03/2024   CREATININE 0.85 09/03/2024   GFRNONAA >60 09/03/2024   CALCIUM  9.0 09/03/2024   PHOS 3.7 09/29/2023   PROT 5.8 (L) 09/03/2024   ALBUMIN 2.9 (L) 09/03/2024   BILITOT 0.9 09/03/2024   ALKPHOS 63 09/03/2024   AST 60 (H) 09/03/2024   ALT 42 09/03/2024   ANIONGAP 9 09/03/2024     Hospital at Home Admission Criteria Checklist:  Formal consent explained in detail and signed at the bedside: yes Patient meets inpatient admission criteria (see below for further details) yes Is pt Medicare FFS/[employer] Medicare-Medicaid, [employer], [employer] Medicare, [employer]vantage, [employer] ( required for initial launch with plan to expand)? yes Lives within 25 mil/ 30 min from [facility] within [place](pt may stay with family member during admission who lives within 25 miles or 30 min from [facility] w/in [facility])? yes Hemodynamically stable with relatively low risk of clinical deterioration-not requiring ICU? yes Age >55? yes Does not require frequent touch-points or complex interventions/medications (ie Titrated Infusions (IV insulin , heparin  drips, vasoactive drips, use of infused or injectable controlled substances,  patients on insulin )? no Any Behavioral Health comorbidities likely to increase risk for in-home care (ie Acute delirium or experiencing a marked altered mental status and cause is not a treatable condition in the home)? no Has the patient been on BIPAP during course of ED evaluation or hospitalization? no IF YES, Has the patient been off of BIPAP for >24 hours(If NO-THEN PATIENT DOES MEET INCLUSION CRITERIA)? not applicable On Room Air or Needs oxygen at home (<6L)? is not on home oxygen therapy. Active safety concerns (ie Unable to use bedside commode independently and lacks caregiver support for safety- needs SNF placement, unable to obtain IV access)? no Has skin check been performed? yes pending  Has Physical Therapy screened the patient? yes  Common admission diagnoses including: CAP, COPD Exacerbation, Acute on chronic heart failure, Cellulitis, UTI , dehydration, acute resp failure with hypoxia (requiring <5L)   Social Screening:  - Has the  family been directly contacted about Hospital at Home program with consent obtained (if yes- please document who was spoken to with name and phone number)? yes  -Was the family approached about the use of TOC pharmacy for medications at discharge? no Denies significant ETOH intake? no Does not smoke and understands may not smoke in the presence of oxygen? no Patient states able to use iPad/phone for communication/has family who is able to use? yes Patient has agreed to be compliant with medication and treatment regimen of the program? yes Any active drug use in patient or primary caregiver including daily dosing of methadone? no Stable home environment ( access to appropriate heating in cold conditions and/or appropriate air conditioning in hot conditions and/or no running water/electricity)? yes  No aggressive pets at home? no Firearm present? no  With ability or willingness to store them unloaded in a locked case for duration of hospitalization? not  applicable Ambulatory? yes  mild difficulty Bed bugs present on home evaluation? no Family support system in place? yes Patient feels safe at home and does not endorse any violence? yes Any actively decompensated behavioral health issues including agitation/aggressive behavior? no  Patient requests food to be provided by hospital home program? no PT/OT eval completed and not requiring SNF, ALF, inpatient rehab? yes  To be admitted to the Hospital at [facility] program, a patient generally must meet the following: 1. Requirement for Inpatient Level of Care: The patient's condition must necessitate an inpatient level of care. This is typically indicated by one or more of the following, depending on their specific diagnosis:  Persistent tachycardia despite appropriate treatment (e.g., for Heart Failure, UTI). Persistent tachypnea (rapid breathing) or dyspnea (shortness of breath) that hasn't improved sufficiently with observation care (e.g., for Heart Failure, Pneumonia, Viral Illness, COVID). Hypoxemia (low oxygen levels), such as a new need for oxygen, an increased need from baseline, or specific oxygen saturation levels (e.g., SpO2 <90-94% depending on the condition) that persist despite observation (e.g., for Heart Failure, COPD, Pneumonia, Viral Illness, COVID). Need for Intravenous (IV) hydration due to an inability to maintain oral hydration, which persists despite observation care (e.g., for Cellulitis, UTI, Viral Illness, COVID). Specific to Heart Failure: Persistent pulmonary edema, indicated by a new oxygen need, lack of improvement with IV diuretics, and ongoing tachypnea/dyspnea. Specific to COPD: A decrease in known baseline resting oxygen saturation (SpO2) by 4% or more, or an increase in pre-existing supplemental oxygen requirements, which persists despite observation and requires continued close monitoring. Specific to Pneumonia: A Pneumonia Severity Index (PSI) class IV (moderate  risk). Specific to Cellulitis: Failure of outpatient antibiotic therapy (indicated by progression or no improvement after a minimum of 48 hours on an adequate regimen) or a clinical presentation (like acuity or rapidity of progression) that requires the intensity of monitoring found in an inpatient setting. Specific to UTI: Persistence or worsening of clinical findings like fever, pain, or dehydration despite observation care; presence of significant uropathy; suspected infection of an indwelling prosthetic device, stent, implant, or graft; or pregnancy with suspected pyelonephritis.  2. Appropriateness for Hospital at Home Setting: The patient's overall clinical picture, including the severity of their illness, their care needs, and their medical history and comorbidities, must be suitable for management in the Hospital at Home environment. This essentially means that none of the exclusion criteria (listed below) are met.  Unified Exclusion Criteria for Hospital at Home Admission: A patient would not be eligible for Hospital at Home if any of the following are  present: Hemodynamic Instability: Hypotension (low blood pressure) is present. Respiratory Instability or Needs Beyond Program Capability: There is a new need for invasive or noninvasive ventilatory assistance (like BiPAP or a ventilator). Oxygenation is not sufficient, generally indicated if an FiO2 (fraction of inspired oxygen) of 45% (which is about 6 Liters/minute via nasal cannula) or more is required to keep oxygen saturation (SpO2) at 90% or greater. Monitoring or Procedural Needs Beyond Program Capability: There is a need for invasive monitoring, such as a pulmonary artery catheter or an arterial line. There is a need for immediate-response telemetry monitoring (for dangerous arrhythmia detection and subsequent immediate intervention). The required medication regimen is beyond the capabilities of Hospital at Home (e.g., dosing  intervals are too frequent for home administration). There is a need for a procedure that cannot be performed by the Hospital at [facility] team (e.g., significant wound debridement or abscess drainage for cellulitis, or percutaneous nephrostomy for a complicated UTI). Significant Organ Dysfunction or Markers of Severe Illness: Mental status is not at baseline, or there is altered mental status suggestive of inadequate perfusion. Renal (kidney) function is unstable or showing an ongoing decline. There is evidence of inadequate perfusion, such as metabolic acidosis or myocardial ischemia. Uncompensated acidosis is present. Condition-Specific Severity or Complications Making Home Care Unsuitable: For Heart Failure: Known severe cardiac valvular disease (e.g., aortic stenosis, mitral regurgitation); or severe peripheral edema that impairs the ability to urinate or ambulate. For COPD: Known concurrent comorbidity or finding that indicates a higher-risk COPD exacerbation (e.g., pulmonary fibrosis, cavitation, pleural effusion, pneumothorax, rib fracture). For Pneumonia: Pneumonia Severity Index (PSI) class V (indicating high risk for inpatient mortality); known concurrent comorbidity or finding that indicates higher-risk pneumonia (e.g., pulmonary fibrosis, cavitation, large or loculated pleural effusion); or a concomitant serious infectious process like endocarditis or empyema. For Cellulitis: Orbital, periorbital, or necrotizing infection is suspected; or a concomitant serious infectious process like endocarditis, septic emboli, or septic joint space infection. For UTI: Urinary tract obstruction (e.g., kidney stone, bladder outlet obstruction); or a concomitant serious infectious process like endocarditis or septic emboli. For Viral Illness & COVID-19: A concomitant serious infectious process like endocarditis or empyema.  General Comorbidities or Status:  The patient is significantly immunosuppressed (this  applies to Pneumonia, Cellulitis, UTI, Viral Illness, and COVID-19). The patient meets inpatient admission criteria for a second diagnosis, or has care needs beyond the capabilities of Hospital at Home due to an active clinically significant comorbidity. (This is a general exclusion across all listed conditions)  Time spent: >60 minutes [facility] 09/03/2024, 1:42 PM   "

## 2024-09-03 NOTE — Progress Notes (Signed)
 Patient was transferred to the care of Hospital at home. Patient was pick up by an EMT staff. IV intact. Patient was alert and oriented, No SOB noted, not in any distress.

## 2024-09-03 NOTE — Assessment & Plan Note (Signed)
 Baseline insulin  dependent type 2 diabetes. Well controlled with A1C 5.5% 08/2024.  Home regimen appears to be Tresidba 48 units QHS, glipizide , metformin , Ozempic  --Continue Lantus  12 units for now --Titrate regimen --CGM in place  --Monitor blood sugars  Titrate long acting insulin  as appropriate

## 2024-09-03 NOTE — Progress Notes (Signed)
 1930 Called to introduce myself. Patient is in no distress. He has no complaints of pain. Established time for Evening visit.   2200 Virtual visit with Anthony Case, his wife, and Victoria, paramedic. We discussed the long course of his foot wound, the improvement in A1C over the past year, and follow up plans with multiple physicians after d/c. He is very knowledgeable of his health.He is an active hiker/traveler/camper. He states that his endurance has been decreased since July and he has moved from 10 mile walks to 3-1 mile hikes. I virtually evaluated his RLE. He reports that the redness has improved since yesterday and he no longer has pain with weight-bearing. He uses an assistive device when walking and his gait is steady. Established overnight contact methods and discussed POC for tomorrow. Personal belongings including wallet and Cpap machine returned to the patient.

## 2024-09-03 NOTE — Assessment & Plan Note (Addendum)
 Sepsis - due to cellulitis - POA, resolved Initially met sepsis criteria with HR 100s, RR 20s, WBC 20-now resolved  Noted recurrent RLE cellulitis w/ prior history strep bacteremia assd with type 2 DM and outdoor travel 09/2023  Most recent admission 02/2024  No reported recent trauma  LE u/s and plain films grossly stable on admission  --Narrowed to cefazolin  w/ clinical improvement  --Case discussed with ID input, given recurrent 3rd episode. Recommended total 10 day course of antibiotics --Discharge on PO cefadroxil  1000 mg BID x 7 doses to complete full 10 days --Follow up with PCP.   --Consider outpatient referral to ID as needed.

## 2024-09-03 NOTE — Progress Notes (Signed)
 Arrived to find the patient sitting upright in his recliner. No obvious distress or trauma noted. Patient is AOx4 and acting to his baseline. PT denied bing in any pain or worsening of symptoms.   All medications were administered and documented. PT's BGL was 248. Lantus  was administered in ABD. Pictures of PT's leg were uploaded to PT's chart.   PT denied having any other questions and was reminded that if he needed anything to call the virtual RN.

## 2024-09-03 NOTE — Assessment & Plan Note (Signed)
 Cont home lipitor and zetia.

## 2024-09-03 NOTE — Evaluation (Signed)
 Physical Therapy Brief Evaluation and Discharge Note Patient Details Name: Anthony Case MRN: [id number] DOB: May 30, 1951 Today's Date: 09/03/2024   History of Present Illness  74 y.o. male who is admitted to [facility] on 08/31/2024 with severe sepsis due to right lower extremity cellulitis after  PMH: chronic systolic/diastolic heart failure, moderate aortic stenosis, type 2 diabetes mellitus, recurrent pulmonary emboli, left lower extremity DVT, prior hospitalizations for right lower extremity cellulitis.   Clinical Impression  Patient evaluated by Physical Therapy with no further acute PT needs identified. All education has been completed and the patient has no further questions. Previously very active after retiring, [place]. He is limited now due to recurrent infections and typically only hikes about 1 mile at a time, 3x/day. Has been on/off outpatient therapy. Hoping to make some changes that will enable him to function at his highest ability and avoid infections. Patient mod I with bed mobility, transferred and ambulates without physical assistance but is comfortable with RW to help steady and unload affected extremity. Recommend RW use at home for now, and OPPT follow-up to reach higher-level functional goals. See below for any follow-up Physical Therapy or equipment needs. PT is signing off. Thank you for this referral.        PT Assessment All further PT needs can be met in the next venue of care  Assistance Needed at Discharge  PRN    Equipment Recommendations Rolling walker (2 wheels)  Recommendations for Other Services       Precautions/Restrictions Precautions Precautions: Fall Recall of Precautions/Restrictions: Intact Restrictions Weight Bearing Restrictions Per Provider Order: No        Mobility  Bed Mobility   Supine/Sidelying to sit: Modified independent (Device/Increased time)   General bed mobility comments: Mod I,  rocks for momentum to rise. No physical assist required.  Transfers Overall transfer level: Modified independent Equipment used: Rolling walker (2 wheels)               General transfer comment: Good power up to stand from low bed setting. RW utilized to steady upon rising.    Ambulation/Gait Ambulation/Gait assistance: Supervision Gait Distance (Feet): 145 Feet Assistive device: Rolling walker (2 wheels) Gait Pattern/deviations: Step-through pattern, Decreased stance time - right, Trunk flexed, Antalgic Gait Speed: Below normal General Gait Details: Grossly stable with RW for support to stabilize and reduce load through affected LE. Bears full weight but using RW as needed with good control, antalgic pattern, and trunk flexed (baseline ankylosing spondylitis reported.) No buckling or overt LOB noted.  Home Activity Instructions Home Activity Instructions: RW for safety and comfort, progress with OPPT as instructed.  Stairs Stairs:  (Appears to have adequate strength and agility demonstrated with transfers and gait. Pt has a rail to assist himself while navigating stairs at home.)          Modified Rankin (Stroke Patients Only)        Balance Overall balance assessment: Mild deficits observed, not formally tested Sitting-balance support: No upper extremity supported, Feet supported Sitting balance-Leahy Scale: Normal     Standing balance support: No upper extremity supported, During functional activity Standing balance-Leahy Scale: Fair Standing balance comment: RW for comfort due to RLE pain.          Pertinent Vitals/Pain   Pain Assessment Pain Assessment: Faces Faces Pain Scale: Hurts little more Pain Location: RLE Pain Descriptors / Indicators: Aching Pain Intervention(s): Limited activity within patient's tolerance, Monitored during session  Home Living Family/patient expects to be discharged to:: Private residence Living Arrangements:  Spouse/significant other Available Help at Discharge: Family Home Environment: Stairs to enter  [employer] of Steps: 3 (+4 (rail on Rt, then bil)) Home Equipment: Other (comment);Cane - single point (Trekking pole)        Prior Function Level of Independence: Independent with assistive device(s) Comments: Using trekking pole, a few falls this year, hikes a lot.    UE/LE Assessment        LE ROM/Strength/Tone/Coordination: Impaired LE ROM/Strength/Tone/Coordination Deficits: RLE guarded due to pain/edema.    Communication   Communication Communication: No apparent difficulties     Cognition Overall Cognitive Status: Appears within functional limits for tasks assessed/performed       General Comments General comments (skin integrity, edema, etc.): Erythema, edematous RLE into foot.    Exercises     Assessment/Plan    PT Problem List Decreased strength;Decreased range of motion;Decreased activity tolerance;Decreased balance;Decreased mobility;Pain       PT Visit Diagnosis Unsteadiness on feet (R26.81);Other abnormalities of gait and mobility (R26.89);Muscle weakness (generalized) (M62.81);Difficulty in walking, not elsewhere classified (R26.2);Pain    No Skilled PT     Co-evaluation                AMPAC 6 Clicks Help needed turning from your back to your side while in a flat bed without using bedrails?: None Help needed moving from lying on your back to sitting on the side of a flat bed without using bedrails?: None Help needed moving to and from a bed to a chair (including a wheelchair)?: A Little Help needed standing up from a chair using your arms (e.g., wheelchair or bedside chair)?: A Little Help needed to walk in hospital room?: A Little Help needed climbing 3-5 steps with a railing? : A Little 6 Click Score: 20      End of Session   Activity Tolerance: Patient tolerated treatment well Patient left: in bed;with call bell/phone within reach  (EOB speaking with MD.) Nurse Communication: Mobility status PT Visit Diagnosis: Unsteadiness on feet (R26.81);Other abnormalities of gait and mobility (R26.89);Muscle weakness (generalized) (M62.81);Difficulty in walking, not elsewhere classified (R26.2);Pain Pain - Right/Left: Right Pain - part of body: Leg;Ankle and joints of foot     Time: 8886-8853 PT Time Calculation (min) (ACUTE ONLY): 33 min  Charges:   PT Evaluation $PT Eval Low Complexity: 1 Low PT Treatments $Gait Training: 8-22 mins    Leontine Roads, PT, DPT [facility]  Rehabilitation Services Physical Therapist Office: [phone] Website: [email or web address]   Leontine GORMAN Roads  09/03/2024, 12:48 PM

## 2024-09-03 NOTE — Progress Notes (Signed)
 Received information about new patient admit in room 6N30C at [facility]. Arrived in [place] and spoke with the nursing staff. Received patient's shadow chart from his nurse. Introduced myself to the patient. Mr Cauthorn in A&O X4 and ambulatory. Although he does have some minor pain while walking related to the swelling in his right foot. I obtained a baseline set of vitals at 15:49. BP 120/69,HR 78,99% on room air. Proceeded to take Mr Palau to our fleeta in the ED. Upon arrival to the [place], Mr Foti asked to sit in the front seat instead of riding in the wheelchair. Given that Mr Rochford is ambulatory, I agreed. Transported Mr Riffe home without incident. Upon arrival at home Paramedic Barnie and [employer] met me there.As they were doing the initial assessments, I setup the hospital at home equipment. The tablet connected to the wearable and BP Cuff with no issues. RN angela confirmed she saw the readings. After making sure the equipment worked and the initial assessment was completed, I cleared the patient's home and returned to [facility]........SABRAGarrel Keel...[id number]......................SABRA

## 2024-09-03 NOTE — Progress Notes (Addendum)
 " Progress Note   Patient: Anthony Case FMW:[id number] DOB: 1951-08-18 DOA: 08/31/2024     3 DOS: the patient was seen and examined on 09/03/2024   Brief hospital course: 74 y.o. male with medical history significant for chronic systolic/diastolic heart failure, moderate aortic stenosis, type 2 diabetes mellitus, recurrent pulmonary emboli, left lower extremity DVT, prior hospitalizations for right lower extremity cellulitis, who is admitted to [facility] on 08/31/2024 with severe sepsis due to right lower extremity cellulitis after presenting from home to [facility] ED complaining of right lower extremity erythema .    The patient reports 1 to 2 days of progressive right foot erythema, tenderness, increased warmth, swelling, with proximal radiation of these features to level proximal to the right ankle.  He notes associated subjective fever, in the absence of any associated chills, fully rigors, or generalized myalgias.  Denies any associated acute focal weakness or any acute focal numbness/paresthesias involving the right lower extremity.  No recent trauma to the right lower extremity.  Denies any associated drainage from the right lower extremity, including no purulent drainage.  Assessment and Plan: #) Severe sepsis due to cellulitis of the right lower extremity:  -had been on doxycycline PTA, finished only 2 days before admission -Blood cx neg thus far -Pt was initially continued with empiric vanc with cefepime  -fevers resolved and wbc normalized -soft tissue swelling on Xray with no foreign body noted -LE dopplers neg -Pt was narrowed to IV ancef  on 1/9. WBC remains normal and pt afebrile. Erythema seems mildly improved, however RLE remains quite edematous. Given hx of diabetes and hx of recurrent LE cellulitis, would recommend continued IV antibiotic for now   #) Type 2 Diabetes Mellitus:  -continue with SSI.   -cont Tresiba  12 units SQ nightly, first dose now, as above.   -In the  setting of presenting acute kidney injury, will hold home gabapentin  for now. -Glycemic trends stable    #) history of multiple previous pulmonary emboli, DVT:  -Continue outpatient Xarelto .    #) Hyperlipidemia: documented h/o such. - -continue home statin and zetia .      #) Chronic systolic/diastolic heart failure:  -Continue home metoprolol  succinate.   -Stable    # )moderate aortic stenosis:  Seems stable at this time     Subjective: Pt did not sleep well overnight. Reports feeling confused and believing he was in [place], taking off his heart monitor. Currently reports swelling may have subsided somewhat.  Physical Exam: Vitals:   09/02/24 1838 09/02/24 2046 09/03/24 0546 09/03/24 0931  BP: 134/61 (!) 145/75 128/69 127/62  Pulse: 64 62 68 66  Resp: 18 17 19 17   Temp: 98 F (36.7 C) 98.4 F (36.9 C) 98.3 F (36.8 C) 98.2 F (36.8 C)  TempSrc: Oral Oral Oral Oral  SpO2: 98% 98% 97% 95%  Weight:      Height:       General exam: Awake, laying in bed, in nad Respiratory system: Normal respiratory effort, no wheezing Cardiovascular system: regular rate, s1, s2 Gastrointestinal system: Soft, nondistended, positive BS Central nervous system: CN2-12 grossly intact, strength intact Extremities: RLE remains edematous below the knee with continued erythema, most notably over dorsum of foot. Multiple scabs over anterior R shin Skin: Normal skin turgor, no notable skin lesions seen Psychiatry: Mood normal // no visual hallucinations   Data Reviewed:  Labs reviewed: Na 136, K 4.1, Cr 0.85, WBC 6.8, Hgb 13.1, Plts 207  Family Communication: Pt in room, family not  at bedside  Disposition: Status is: Inpatient Remains inpatient appropriate because: severity of illness  Planned Discharge Destination: Home    Author: Garnette Pelt, MD 09/03/2024 1:37 PM  For on call review [email or web address]  "

## 2024-09-03 NOTE — Assessment & Plan Note (Signed)
 Cont xarelto 

## 2024-09-04 LAB — CBC
HCT: 41.1 % (ref 39.0–52.0)
Hemoglobin: 14 g/dL (ref 13.0–17.0)
MCH: 30.4 pg (ref 26.0–34.0)
MCHC: 34.1 g/dL (ref 30.0–36.0)
MCV: 89.2 fL (ref 80.0–100.0)
Platelets: 246 K/uL (ref 150–400)
RBC: 4.61 MIL/uL (ref 4.22–5.81)
RDW: 13.7 % (ref 11.5–15.5)
WBC: 6.9 K/uL (ref 4.0–10.5)
nRBC: 0 % (ref 0.0–0.2)

## 2024-09-04 LAB — COMPREHENSIVE METABOLIC PANEL WITH GFR
ALT: 44 U/L (ref 0–44)
AST: 45 U/L — ABNORMAL HIGH (ref 15–41)
Albumin: 3.1 g/dL — ABNORMAL LOW (ref 3.5–5.0)
Alkaline Phosphatase: 67 U/L (ref 38–126)
Anion gap: 10 (ref 5–15)
BUN: 22 mg/dL (ref 8–23)
CO2: 21 mmol/L — ABNORMAL LOW (ref 22–32)
Calcium: 9.5 mg/dL (ref 8.9–10.3)
Chloride: 107 mmol/L (ref 98–111)
Creatinine, Ser: 0.94 mg/dL (ref 0.61–1.24)
GFR, Estimated: >60 mL/min
Glucose, Bld: 215 mg/dL — ABNORMAL HIGH (ref 70–99)
Potassium: 4.4 mmol/L (ref 3.5–5.1)
Sodium: 137 mmol/L (ref 135–145)
Total Bilirubin: 0.6 mg/dL (ref 0.0–1.2)
Total Protein: 6.2 g/dL — ABNORMAL LOW (ref 6.5–8.1)

## 2024-09-04 MED ORDER — OMEGA-3-ACID ETHYL ESTERS 1 G PO CAPS
1.0000 g | ORAL_CAPSULE | Freq: Every day | ORAL | Status: DC
  Administered 2024-09-04 – 2024-09-06 (×3): 1 g via ORAL
  Filled 2024-09-04 (×4): qty 1

## 2024-09-04 MED ORDER — ADULT MULTIVITAMIN W/MINERALS CH
1.0000 | ORAL_TABLET | Freq: Every day | ORAL | Status: DC
  Administered 2024-09-04 – 2024-09-06 (×3): 1 via ORAL
  Filled 2024-09-04 (×4): qty 1

## 2024-09-04 MED ORDER — FOLIC ACID 1 MG PO TABS
1.0000 mg | ORAL_TABLET | Freq: Every day | ORAL | Status: DC
  Administered 2024-09-04 – 2024-09-06 (×3): 1 mg via ORAL
  Filled 2024-09-04 (×4): qty 1

## 2024-09-04 MED ORDER — DAPAGLIFLOZIN PROPANEDIOL 10 MG PO TABS
10.0000 mg | ORAL_TABLET | Freq: Every day | ORAL | Status: DC
  Administered 2024-09-04 – 2024-09-06 (×3): 10 mg via ORAL
  Filled 2024-09-04 (×4): qty 1

## 2024-09-04 MED ORDER — VITAMIN C 500 MG PO TABS
1000.0000 mg | ORAL_TABLET | Freq: Every day | ORAL | Status: DC
  Administered 2024-09-04 – 2024-09-06 (×3): 1000 mg via ORAL
  Filled 2024-09-04 (×4): qty 2

## 2024-09-04 MED ORDER — SENNOSIDES-DOCUSATE SODIUM 8.6-50 MG PO TABS
1.0000 | ORAL_TABLET | Freq: Every evening | ORAL | Status: DC | PRN
  Filled 2024-09-04: qty 1

## 2024-09-04 MED ORDER — GLIPIZIDE ER 5 MG PO TB24
5.0000 mg | ORAL_TABLET | Freq: Every day | ORAL | Status: DC
  Administered 2024-09-04 – 2024-09-06 (×3): 5 mg via ORAL
  Filled 2024-09-04 (×3): qty 1

## 2024-09-04 MED ORDER — GABAPENTIN 100 MG PO CAPS
100.0000 mg | ORAL_CAPSULE | Freq: Every day | ORAL | Status: DC
  Administered 2024-09-04 – 2024-09-06 (×3): 100 mg via ORAL
  Filled 2024-09-04 (×4): qty 1

## 2024-09-04 MED ORDER — VITAMIN D 25 MCG (1000 UNIT) PO TABS
1000.0000 [IU] | ORAL_TABLET | Freq: Every day | ORAL | Status: DC
  Administered 2024-09-04 – 2024-09-06 (×3): 1000 [IU] via ORAL
  Filled 2024-09-04 (×4): qty 1

## 2024-09-04 NOTE — Progress Notes (Signed)
 Phone call completed with patient. Introduced myself as the CHARITY FUNDRAISER for the evening and reminded patient that I am available if needed, via phone or tablet call, throughout the night. Patient states he is doing well and denies pain or discomfort at this time. He reports that the swelling in his legs appears to be improving and that he had a really good nap this afternoon. Reviewed plan of care with patient which includes a medic visit to assist with bedtime meds. Patient in agreement with plan.

## 2024-09-04 NOTE — Assessment & Plan Note (Signed)
 Anthony Case

## 2024-09-04 NOTE — Progress Notes (Signed)
 Arrived to the PT's house for their admission visit into the [facility]@H  program. PT is brought home via wheelchair fleeta and walks into the home with a semi-normal gait. PT sits down in his recliner and places his feet up. All equipment was brought into the home and set up by EMT [place]. PT's medications were sequestered and placed in temper resistant bags and in his PT's bedroom. Medication bins were placed in the PT's dining room and were explained to the family.   Physical exam showed negative trauma to head, neck, or face. PERRLA. PT wears eye glasses at baseline. Negative JVD or tracheal deviation. Chest expansion is equal with non-labored breathing. PT denied any chest pain or pressure. ABD is soft, non-tender in all quadrants. PT denied feeling bloated or constipated. Negative peripheral edema to bilateral arms or left leg. Right lower extremity is noted to be swollen and red to the foot and ankle. +4 pitting edema to leg and lateral calf area. A line was drawn to mark the redness. Pedal pulse was present but weak due to edema. PT has blister on big toe that is scabbed over. PT has a hx of neuropathy; due to swelling in right foot he c/o difficulty walking. A walker from the hospital was brought home with the PT and will be used until he is provided one from DME.   Medication reconciliation was performed. PT has hydrocodone  and norco, however he has not been using them and denied wanting them provided. Previous established IV access was occluded and removed. New IV access was established in right forearm.   PT and wife denied having any other questions or concerns. They were informed that a paramedic would be out later this evening to do his IV abx. If needed anything before then to call the virtual RN.

## 2024-09-04 NOTE — Progress Notes (Signed)
 Called patient to check how he was feeling and that we noted his O2 Saturations improved holding 97-100% RA. Wife glad I called her phone as patient was napping. Answered all questions. To spouse satisfaction.

## 2024-09-04 NOTE — Progress Notes (Signed)
 Arrived to find the PT sitting upright in his recliner. PT is Aox4 and acting to his baseline. PT stated that he slept from 12:30-18:30 this afternoon and was feeling good. PT denied being in any pain.   Physical exam showed negative trauma to head, neck, or face. PERRLA. PT wear's eye glasses at his baseline. PT denied any visual changes or disturbances. Negative JVD or tracheal deviation. Chest expansion is equal with non-labored breathing. L/S were c/e in all fields. ABD is soft, non-tender upon palpation in all quadrants. PT stated that his appetite has been normal. Negative peripheral edema to bilateral arms or left leg. RLE has +3 pitting edema to the foot and calf. PMS is intact. PT is able to walk on his foot with minimal pain. PT's walking gait has improved since yesterday.   Vitals were obtained and documented appropriately. IV was patent. All medications were administered as prescribed.   PT denied having any other questions or concerns. Him and his wife were reminded that if they needed anything to call the virtual RN.

## 2024-09-04 NOTE — Progress Notes (Addendum)
 On virtual with patient,  reports feeling well. Patient, scientist, physiological, Tax Inspector. Discussed possible D/C Tues for 7 days total of Abx. Provider verbal order to hold metoprolol . Denies pain or shortness of breath. Pateint verbalized understanding providers plan and expressed thankfulness in being able to be home and cared for.

## 2024-09-04 NOTE — Assessment & Plan Note (Addendum)
 Cr on admission 1.59, from prior baseline 0.97 in March 2025.  Suspected pre-renal azotemia.  Entresto  and gabapentin  were  held, have been resumed.   Treated with IV fluids. AKI resolved. Cr 0.92 --Monitor renal function

## 2024-09-04 NOTE — Progress Notes (Signed)
 Pt seen for routine [facility] morning visit. Pt appears well and states he slept very well last night and is glad to be at home. Pt denies any pain or complaints at this time.  Vital signs and assessment obtained as noted. Pt's CBG was 216. Pt is hypotensive as noted under flowsheets. Dr. Fausto and Darice, RN notified and held metoprolol  for now. Pt denies any dizziness at this time and states his BP usually runs fairly low at baseline. Lung sounds clear and equal bilaterally. Pt denies any cough. Radial pulses are strong and regular. Unable to palpate right dorsalis pedal pulse due to edema. +4 pitting edema noted to RLE and photos of same added to media tab. Pt has not had a bm since 09/01/24 but declines laxatives or stool softeners at this time. Pt states he will notify RN later in the day if he feels he needs same. I notified RN and MD of same.   Medications administered as noted. Pt would like his glipizide  to be re-timed to morning medications and RN and MD notified.   Pt had virtual visit with Dr. Fausto and Darice, RN and plan of care discussed.   IV care completed including new curos cap. Labs drawn. Pt encouraged to call

## 2024-09-04 NOTE — Assessment & Plan Note (Addendum)
 Sepsis physiology has resolved. Continue antibiotics as outlined. Follow blood cultures - negative

## 2024-09-04 NOTE — Progress Notes (Signed)
 "  Hospital at Home Daily Progress Note   Patient: Anthony Case  MRN: [id number]  DOB: Feb 19, 1951  DOA: 08/31/2024  DOS: the patient was seen and examined on @TODAY @    Patient identified themself as Anthony Case  DOB [birth year]  Medic Steffan Daring present in the home during encounter and performed the assessment and physical exam.  Patient was seen today via video conference; my physical location [place], [place].   Brief hospital course:  74 y.o. male with medical history significant for chronic systolic/diastolic heart failure, moderate aortic stenosis, type 2 diabetes mellitus, recurrent pulmonary emboli, left lower extremity DVT, prior hospitalizations for right lower extremity cellulitis, who is admitted to [facility] on 08/31/2024 with severe sepsis due to right lower extremity cellulitis after presenting from home to [facility] ED complaining of right lower extremity erythema .    The patient reports 1 to 2 days of progressive right foot erythema, tenderness, increased warmth, swelling, with proximal radiation of these features to level proximal to the right ankle.  He notes associated subjective fever, in the absence of any associated chills, fully rigors, or generalized myalgias.  Denies any associated acute focal weakness or any acute focal numbness/paresthesias involving the right lower extremity.  No recent trauma to the right lower extremity.  Denies any associated drainage from the right lower extremity, including no purulent drainage.  Significant Events: 08/31/24 - Admitted  09/03/24 - Transferred to Hospital at Home   Assessment and Plan:  * Cellulitis of right lower extremity .  Cellulitis Sepsis  Initially met sepsis criteria with HR 100s, RR 20s, WBC 20-now resolved  Noted recurrent RLE cellulitis w/ prior history strep bacteremia assd with type 2 DM and outdoor travel 09/2023  Most recent admission 02/2024  No reported recent trauma  LE u/s and plain films  grossly stable on admission  --Narrowed to cefazolin  w/ clinical improvement  --Continue IV dosing x 1-2 more days  --Will get ID input and outpatient follow up arranged, given recurrent 3rd episode. 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 Well controlled with A1C 5.5% 08/2024.  Home regimen appears to be Tresidba 48 units QHS, glipizide , metformin , Ozempic  --Continue Lantus  12 units for now --CBG's in 100's, at inpatient goal --Titrate regimen --CGM in place  --Monitor blood sugars  Titrate long acting insulin  as appropriate   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) 2D ECHO 10/2023 w/ EF 40-45% and grade 2 diastolic dysfunction  Appears euvolemic at present  Cont home regimen including entresto   Monitor    History of pulmonary embolism Cont xarelto     Psoriatic arthritis (HCC) Baseline psoriatic arthritis  No longer on methotrexate in setting of recent recurrent LE cellulitis  On Otezla per 03/2024 rheumatology progress note  Holding Otezla in setting of infection  Monitor    HLD (hyperlipidemia) Cont home lipitor and zetia    Chronic combined systolic and diastolic heart failure (HCC) 2D ECHO 10/2023 w/ EF 40-45% and grade 2 diastolic dysfunction  Appears euvolemic, stable. --Cont home Entresto , Farxiga , metoprolol  --Monitor volume status, daily weights    Moderate aortic stenosis .  AKI (acute kidney injury) Cr on admission 1.59, from prior baseline 0.97 in March 2025.  Suspected pre-renal azotemia.  Entresto  and gabapentin  were  held, have been resumed.   Treated with IV fluids. AKI resolved. Cr today  0.95 --Monitor renal function  Severe sepsis (HCC) Sepsis physiology has improved. Continue antibiotics as outlined. Follow blood cultures - negative x 4 days.     Patient Active Problem List    Diagnosis Date Noted   Pulmonary embolism (HCC) 04/09/2012    Priority: High    Class: Acute   Left leg DVT (HCC) 04/09/2012    Priority: High    Class: Acute   Dyspnea on exertion 04/07/2012    Priority: High    Class: Acute   Leg edema, left 04/07/2012    Priority: High    Class: Acute   Cellulitis 09/27/2023    Priority: 1.   Diabetic foot ulcer (HCC) 12/04/2020    Priority: 1.   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   HYPERTENSION 04/12/2008    Priority: 3.   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   Normocytic anemia 09/27/2023    Priority: 5.   Renal insufficiency 09/27/2023    Priority: 6.   History of DVT (deep vein thrombosis) 09/27/2023    Priority: 7.   History of pulmonary embolism 09/27/2023    Priority: 7.   Psoriatic arthritis (HCC) 09/27/2023    Priority: 8.   HLD (hyperlipidemia) 04/12/2008    Priority: 9.   Obesity (BMI 30-39.9) 09/27/2023    Priority: 10.   OSA on CPAP 09/27/2023    Priority: 10.   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   Dilated cardiomyopathy (HCC) 04/23/2022   Acute on chronic systolic congestive heart failure (HCC) 04/23/2022   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   LBBB (left bundle branch block) 08/27/2021   Non-ischemic cardiomyopathy (HCC) 08/27/2021   Laceration of left great toe 08/03/2020   Pain due to onychomycosis of toenail of left foot 04/05/2019   History of adenomatous  polyp of colon 08/05/2013   Thoracic spine fracture (HCC) 05/23/2013   DM2 (diabetes mellitus, type 2) (HCC) 04/12/2008   Severe obstructive sleep apnea-hypopnea syndrome 04/12/2008      Subjective / Interval 24 hour History:  Pt seen virtually during Medic visit to the home today. He reports some slight improvement in his right foot redness and swelling, still with significant swelling.  Less pain with ambulating around home.  Keeping the leg elevated when in recliner  He slept very well overnight.  No other acute complaints.  Last BM 1/8, discussed making stool softener available.     Antibiotic Therapy: Anti-infectives (From admission, onward)    Start     Dose/Rate Route Frequency Ordered Stop   09/03/24 2000  ceFAZolin  (ANCEF ) IVPB 3g/150 mL premix        3 g 300 mL/hr over 30 Minutes Intravenous 2 times daily 09/03/24 1343 09/10/24 0959   09/03/24 1800  linezolid  (ZYVOX ) IVPB 600 mg  Status:  Discontinued        600 mg 300 mL/hr over 60 Minutes Intravenous Every 12 hours 09/03/24 1340 09/03/24 1343   09/02/24 2200  ceFAZolin  (ANCEF ) IVPB 2g/100 mL premix  Status:  Discontinued        2 g 200 mL/hr over 30 Minutes Intravenous Every 8 hours 09/02/24 1926 09/03/24 1340   09/01/24 2200  vancomycin  (VANCOREADY) IVPB 1250 mg/250 mL  Status:  Discontinued        1,250 mg 166.7 mL/hr over 90 Minutes Intravenous Every 24 hours 09/01/24 0210 09/01/24 1045   09/01/24 2200  vancomycin  (VANCOREADY) IVPB 1500 mg/300 mL  Status:  Discontinued        1,500 mg 150 mL/hr over 120 Minutes Intravenous Every 24 hours 09/01/24 1045 09/02/24 1214   09/01/24 1800  ceFEPIme  (MAXIPIME ) 2 g in sodium chloride  0.9 % 100 mL IVPB  Status:  Discontinued        2 g 200 mL/hr over 30 Minutes Intravenous Every 8 hours 09/01/24 1042 09/02/24 1214   09/01/24 0300  ceFEPIme  (MAXIPIME ) 2 g in sodium chloride  0.9 % 100 mL IVPB  Status:  Discontinued        2 g 200 mL/hr over 30 Minutes Intravenous Every 12 hours  09/01/24 0210 09/01/24 1042   09/01/24 0045  vancomycin  (VANCOREADY) IVPB 2000 mg/400 mL        2,000 mg 200 mL/hr over 120 Minutes Intravenous  Once 09/01/24 0037 09/01/24 0344   08/31/24 2315  cefTRIAXone  (ROCEPHIN ) 2 g in sodium chloride  0.9 % 100 mL IVPB  Status:  Discontinued        2 g 200 mL/hr over 30 Minutes Intravenous Every 24 hours 08/31/24 2310 09/01/24 0210       Procedures: none  Consultants: none          Physical Exam:    09/04/2024   10:16 AM 09/04/2024   10:14 AM 09/03/2024   10:02 PM  Vitals with BMI  Height   5' 11  Systolic 102 87   Diastolic 58 54   Pulse 89      Bedside physical exam was performed by Medic listed above.  Below exam findings are based on their in person physical exam findings and my observations during virtual encounter.   General exam: awake, alert, no acute distress HEENT: voice clear, hearing grossly normal  Respiratory system: CTAB, no wheezes, rales or rhonchi, normal respiratory effort. Cardiovascular system: normal S1/S2, RRR, 2+ symmetric radial pulses  Central nervous system: A&O x4. no gross focal neurologic deficits, normal speech Extremities: photos below - right foot erythema appears fading, persistent 4+ pitting RLE edema, no LLE edema Skin: photos below Psychiatry: normal mood, congruent affect, judgement and insight appear normal         Data Reviewed:  Notable labs --  CMP notable for bicarb improved 19 >> 21, glucose 215, albumin 3.1, AST improved 60 >> 45, total protein 6.2 CBC is normal  Family Communication: wife present in the home during virtual visit  Disposition: Status is: Inpatient Remains inpatient appropriate because: remains on IV antibiotics pending further clinical improvement in the setting of 3rd recurrence of cellulitis   Planned Discharge Destination: Home    Time spent: 45 minutes  Author: Burnard DELENA Cunning, DO [facility]  04/14/2024 7:00 AM  For on call  review [email or web address]   "

## 2024-09-04 NOTE — Assessment & Plan Note (Signed)
 SABRA

## 2024-09-04 NOTE — Assessment & Plan Note (Addendum)
 2D ECHO 10/2023 w/ EF 40-45% and grade 2 diastolic dysfunction  Appears euvolemic, stable. --Cont home Entresto , Farxiga , metoprolol  --Monitor volume status, daily weights --Reduced dose of Entresto  and metoprolol  due to hypotension --Follow up with Cardiology

## 2024-09-04 NOTE — TOC Initial Note (Signed)
 Transition of Care [facility]) - Initial/Assessment Note    Patient Details  Name: Anthony Case MRN: [id number] Date of Birth: 29-May-1951  Transition of [facility] CM/SW Contact:    Corean JAYSON Canary, RN Phone Number: 09/04/2024, 4:35 PM  Clinical Narrative:                 Patient  presented with lower extremity cellulitis.  He is from home with spouse, transitioned to hospital at home service . PT assessed him and recommended outpatient PT. He has some gait instability requesting walker- placed in HUB for [employer] to deliver  IPCM will follow for needs  Expected Discharge Plan: Home/Self Care Barriers to Discharge: Continued Medical Work up   Patient Goals and CMS Choice            Expected Discharge Plan and Services   Discharge Planning Services: CM Consult   Living arrangements for the past 2 months: Single Family Home                                      Prior Living Arrangements/Services Living arrangements for the past 2 months: Single Family Home Lives with:: Spouse Patient language and need for interpreter reviewed:: Yes Do you feel safe going back to the place where you live?: Yes      Need for Family Participation in Patient Care: Yes (Comment) Care giver support system in place?: Yes (comment)   Criminal Activity/Legal Involvement Pertinent to Current Situation/Hospitalization: No - Comment as needed  Activities of Daily Living   ADL Screening (condition at time of admission) Independently performs ADLs?: Yes (appropriate for developmental age) Is the patient deaf or have difficulty hearing?: No Does the patient have difficulty seeing, even when wearing glasses/contacts?: No Does the patient have difficulty concentrating, remembering, or making decisions?: No  Permission Sought/Granted                  Emotional Assessment       Orientation: : Oriented to Self, Oriented to Place, Oriented to  Time, Oriented to Situation Alcohol  /  Substance Use: Not Applicable Psych Involvement: No (comment)  Admission diagnosis:  Cellulitis [L03.90] Foot pain, right [M79.671] Cellulitis of right lower extremity [L03.115] Patient Active Problem List   Diagnosis Date Noted   AKI (acute kidney injury) 09/01/2024   Acute prerenal azotemia 09/01/2024   Moderate aortic stenosis 09/01/2024   Chronic combined systolic and diastolic heart failure (HCC) 09/01/2024   Cellulitis of right lower extremity 08/31/2024   Streptococcal bacteremia 11/15/2023   Severe sepsis (HCC) 11/11/2023   Cellulitis 09/27/2023   Normocytic anemia 09/27/2023   Renal insufficiency 09/27/2023   History of DVT (deep vein thrombosis) 09/27/2023   History of pulmonary embolism 09/27/2023   Psoriatic arthritis (HCC) 09/27/2023   Obesity (BMI 30-39.9) 09/27/2023   OSA on CPAP 09/27/2023   Dependence on CPAP ventilation 07/10/2022   Ankylosing spondylitis of thoracic region (HCC) 04/23/2022   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   Dilated cardiomyopathy (HCC) 04/23/2022   Acute on chronic systolic congestive heart failure (HCC) 04/23/2022   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) 08/27/2021   LBBB (left bundle branch block) 08/27/2021   Non-ischemic cardiomyopathy (HCC) 08/27/2021   Diabetic foot ulcer (HCC) 12/04/2020   Laceration of left great toe 08/03/2020   Pain due to onychomycosis  of toenail of left foot 04/05/2019   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) 04/05/2019   History of adenomatous polyp of colon 08/05/2013   Thoracic spine fracture (HCC) 05/23/2013   Pulmonary embolism (HCC) 04/09/2012    Class: Acute   Left leg DVT (HCC) 04/09/2012    Class: Acute   Dyspnea on exertion 04/07/2012    Class: Acute   Leg edema, left 04/07/2012    Class: Acute   DM2 (diabetes mellitus, type 2) (HCC) 04/12/2008   HLD (hyperlipidemia)  04/12/2008   HYPERTENSION 04/12/2008   Severe obstructive sleep apnea-hypopnea syndrome 04/12/2008   PCP:  Yolande Toribio MATSU, MD Pharmacy:   [facility] [address], [place] - [address] [place] [address] Phone: [phone] Fax: [phone]     Social Drivers of Health (SDOH) Social History: SDOH Screenings   Food Insecurity: No Food Insecurity (09/01/2024)  Housing: Low Risk (09/01/2024)  Transportation Needs: No Transportation Needs (09/01/2024)  Utilities: Not At Risk (09/01/2024)  Social Connections: Socially Integrated (09/01/2024)  Tobacco Use: Low Risk (08/31/2024)   SDOH Interventions:     Readmission Risk Interventions     No data to display

## 2024-09-05 LAB — BASIC METABOLIC PANEL WITH GFR
Anion gap: 9 (ref 5–15)
BUN: 21 mg/dL (ref 8–23)
CO2: 22 mmol/L (ref 22–32)
Calcium: 9.6 mg/dL (ref 8.9–10.3)
Chloride: 109 mmol/L (ref 98–111)
Creatinine, Ser: 0.92 mg/dL (ref 0.61–1.24)
GFR, Estimated: >60 mL/min
Glucose, Bld: 198 mg/dL — ABNORMAL HIGH (ref 70–99)
Potassium: 4.6 mmol/L (ref 3.5–5.1)
Sodium: 140 mmol/L (ref 135–145)

## 2024-09-05 LAB — CULTURE, BLOOD (ROUTINE X 2)
Culture: NO GROWTH
Culture: NO GROWTH
Special Requests: ADEQUATE
Special Requests: ADEQUATE

## 2024-09-05 NOTE — Progress Notes (Signed)
 H@H  medic out to see patient for his second visit of the day. On arrival I was welcomed at the door by the patients wife. See flowsheet for assessment. Patients blood pressure was soft this afternoon. Virtual RN was notified at this time who relayed information to the provider. No orders at this time. No other acute changes from this morning. Pt has no complaints and denies headaches, dizziness, chest pain, abd pain, and feeling lightheaded. Pt was told to call in if he started to not feel well. No other tasks needed at this time. Pt was left in care of himself with wife at his side. H@H  visit completed.

## 2024-09-05 NOTE — Plan of Care (Signed)
" °  Problem: Education: Goal: Knowledge of General Education information will improve Description: Including pain rating scale, medication(s)/side effects and non-pharmacologic comfort measures Outcome: Progressing   Problem: Clinical Measurements: Goal: Ability to maintain clinical measurements within normal limits will improve Outcome: Progressing Goal: Diagnostic test results will improve Outcome: Progressing Goal: Cardiovascular complication will be avoided Outcome: Progressing   Problem: Activity: Goal: Risk for activity intolerance will decrease Outcome: Progressing   Problem: Nutrition: Goal: Adequate nutrition will be maintained Outcome: Progressing   Problem: Safety: Goal: Ability to remain free from injury will improve Outcome: Progressing   Problem: Clinical Measurements: Goal: Ability to avoid or minimize complications of infection will improve Outcome: Progressing   Problem: Skin Integrity: Goal: Skin integrity will improve Outcome: Progressing   "

## 2024-09-05 NOTE — Progress Notes (Signed)
 H@H  medic out to see patient for his first visit of the day. On arrival, I was met by the patients wife at the door who welcomed me in. See flowsheet for assessment and media for pictures of RLE cellulitis that was updated today. Pt has no complaints this morning. IV was flushed and maintained. A video call was conducted and completed with the virtual RN and provider. Virtual RN and provider was shown RLE cellulitis on video. Medications were matched against the [facility] and administered with RN oversight. Labs were drawn as ordered. No other tasks needed for this visit. The patient was left in care of himself with his wife home with him. H@H  visit completed.

## 2024-09-05 NOTE — Progress Notes (Signed)
 Phone call completed with patient. Made patient aware that I will be the RN for the night and that I can be reached anytime via phone or tablet call. Patient denies any pain or discomfort at this time. Plan for the evening discussed which includes a medic visit to assist with nighttime medications. Patient in agreement with plan.

## 2024-09-05 NOTE — Progress Notes (Signed)
 Attempted virtual call no answer called spouse at [phone] and spoke to her she shared patient is still sleeping I informed medics will be to them in  . All current health data parameters all met.

## 2024-09-05 NOTE — Progress Notes (Signed)
 Secured chatted with CM r/t walker DME ordered for Mr Camey. Expected delivery is 01/13.

## 2024-09-05 NOTE — Progress Notes (Signed)
 Patient current heal alarm for BP. Called Medic in place cuff size too ;arge for patient Medic took manual with proper cuff size BP 118/60. Patient reports sleeping very well, denies chest pressure/pain, shortness of breath, palpitations or distress. No visible signs of distress speech is clear. Medic completing assessment.

## 2024-09-05 NOTE — Progress Notes (Signed)
 "  Hospital at Home Daily Progress Note   Patient: Anthony Case  MRN: [id number]  DOB: 1951/02/07  DOA: 08/31/2024  DOS: the patient was seen and examined on @TODAY @    Patient identified themself as Anthony Case  DOB 10-07-1950  Medic Consuelo Kerns present in the home during encounter and performed the assessment and physical exam.  Patient was seen today via video conference; my physical location [place], [place].   Brief hospital course:  74 y.o. male with medical history significant for chronic systolic/diastolic heart failure, moderate aortic stenosis, type 2 diabetes mellitus, recurrent pulmonary emboli, left lower extremity DVT, prior hospitalizations for right lower extremity cellulitis, who is admitted to [facility] on 08/31/2024 with severe sepsis due to right lower extremity cellulitis after presenting from home to [facility] ED complaining of right lower extremity erythema .    The patient reports 1 to 2 days of progressive right foot erythema, tenderness, increased warmth, swelling, with proximal radiation of these features to level proximal to the right ankle.  He notes associated subjective fever, in the absence of any associated chills, fully rigors, or generalized myalgias.  Denies any associated acute focal weakness or any acute focal numbness/paresthesias involving the right lower extremity.  No recent trauma to the right lower extremity.  Denies any associated drainage from the right lower extremity, including no purulent drainage.  Significant Events: 08/31/24 - Admitted  09/03/24 - Transferred to Hospital at Home   Assessment and Plan:  * Cellulitis of right lower extremity .  Cellulitis Sepsis  Initially met sepsis criteria with HR 100s, RR 20s, WBC 20-now resolved  Noted recurrent RLE cellulitis w/ prior history strep bacteremia assd with type 2 DM and outdoor travel 09/2023  Most recent admission 02/2024  No reported recent trauma  LE u/s and plain films  grossly stable on admission  --Narrowed to cefazolin  w/ clinical improvement  --Continue IV dosing x 1-2 more days  --Will get ID input, given recurrent 3rd episode. 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 Well controlled with A1C 5.5% 08/2024.  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) 2D ECHO 10/2023 w/ EF 40-45% and grade 2 diastolic dysfunction  Appears euvolemic at present  Cont home regimen including entresto   Monitor    History of pulmonary embolism Cont xarelto     Psoriatic arthritis (HCC) Baseline psoriatic arthritis  No longer on methotrexate in setting of recent recurrent LE cellulitis  On Otezla per 03/2024 rheumatology progress note  Holding Otezla in setting of infection  Monitor    HLD (hyperlipidemia) Cont home lipitor and zetia    Chronic combined systolic and diastolic heart failure (HCC) 2D ECHO 10/2023 w/ EF 40-45% and grade 2 diastolic dysfunction  Appears euvolemic, stable. --Cont home Entresto , Farxiga , metoprolol  --Monitor volume status, daily weights    Moderate aortic stenosis .  AKI (acute kidney injury) Cr on admission 1.59, from prior baseline 0.97 in March 2025.  Suspected pre-renal azotemia.  Entresto  and gabapentin  were  held, have been resumed.   Treated with IV fluids. AKI resolved. Cr today 0.92 --Monitor renal function  Severe sepsis (HCC) Sepsis physiology has  improved. Continue antibiotics as outlined. Follow blood cultures - negative x 4 days.     Patient Active Problem List   Diagnosis Date Noted   Pulmonary embolism (HCC) 04/09/2012    Priority:  High    Class: Acute   Left leg DVT (HCC) 04/09/2012    Priority: High    Class: Acute   Dyspnea on exertion 04/07/2012    Priority: High    Class: Acute   Leg edema, left 04/07/2012    Priority: High    Class: Acute   Cellulitis 09/27/2023    Priority: 1.   Diabetic foot ulcer (HCC) 12/04/2020    Priority: 1.   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   HYPERTENSION 04/12/2008    Priority: 3.   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   Normocytic anemia 09/27/2023    Priority: 5.   Renal insufficiency 09/27/2023    Priority: 6.   History of DVT (deep vein thrombosis) 09/27/2023    Priority: 7.   History of pulmonary embolism 09/27/2023    Priority: 7.   Psoriatic arthritis (HCC) 09/27/2023    Priority: 8.   HLD (hyperlipidemia) 04/12/2008    Priority: 9.   Obesity (BMI 30-39.9) 09/27/2023    Priority: 10.   OSA on CPAP 09/27/2023    Priority: 10.   AKI (acute kidney injury) 09/01/2024   Acute prerenal azotemia 09/01/2024   Moderate aortic stenosis 09/01/2024   Chronic combined systolic and diastolic heart failure (HCC) 09/01/2024   Cellulitis of right lower extremity 08/31/2024   Streptococcal bacteremia 11/15/2023   Severe sepsis (HCC) 11/11/2023   Dependence on CPAP ventilation 07/10/2022   Ankylosing spondylitis of thoracic region [facility]) 04/23/2022   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   Dilated cardiomyopathy (HCC) 04/23/2022   Acute on chronic systolic congestive heart failure (HCC) 04/23/2022   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   LBBB (left bundle branch block) 08/27/2021   Non-ischemic cardiomyopathy (HCC) 08/27/2021   Laceration of left great toe 08/03/2020   Pain due to onychomycosis of toenail of left foot 04/05/2019   History of adenomatous polyp of colon 08/05/2013   Thoracic spine fracture (HCC) 05/23/2013    DM2 (diabetes mellitus, type 2) (HCC) 04/12/2008   Severe obstructive sleep apnea-hypopnea syndrome 04/12/2008      Subjective / Interval 24 hour History:  Pt seen virtually during Medic visit to the home today. Reports feeling better. Notes some improvement in right leg and foot swelling. Redness continues to slowly improve.  He took a long nap yesterday and slept well overnight. Denies other acute complaints.     Antibiotic Therapy: Anti-infectives (From admission, onward)    Start     Dose/Rate Route Frequency Ordered Stop   09/03/24 2000  ceFAZolin  (ANCEF ) IVPB 3g/150 mL premix        3 g 300 mL/hr over 30 Minutes Intravenous 2 times daily 09/03/24 1343 09/10/24 0959   09/03/24 1800  linezolid  (ZYVOX ) IVPB 600 mg  Status:  Discontinued        600 mg 300 mL/hr over 60 Minutes Intravenous Every 12 hours 09/03/24 1340 09/03/24 1343   09/02/24 2200  ceFAZolin  (ANCEF ) IVPB 2g/100 mL premix  Status:  Discontinued        2 g 200 mL/hr over 30 Minutes Intravenous Every 8 hours 09/02/24 1926 09/03/24 1340   09/01/24 2200  vancomycin  (VANCOREADY) IVPB 1250 mg/250 mL  Status:  Discontinued        1,250 mg 166.7 mL/hr over 90 Minutes Intravenous Every 24 hours 09/01/24 0210 09/01/24 1045   09/01/24 2200  vancomycin  (VANCOREADY) IVPB 1500 mg/300 mL  Status:  Discontinued        1,500 mg 150 mL/hr over 120 Minutes Intravenous Every 24 hours 09/01/24 1045 09/02/24 1214   09/01/24 1800  ceFEPIme  (MAXIPIME ) 2 g in sodium chloride  0.9 % 100 mL IVPB  Status:  Discontinued        2 g 200 mL/hr over 30 Minutes Intravenous Every 8 hours 09/01/24 1042 09/02/24 1214   09/01/24 0300  ceFEPIme  (MAXIPIME ) 2 g in sodium chloride  0.9 % 100 mL IVPB  Status:  Discontinued        2 g 200 mL/hr over 30 Minutes Intravenous Every 12 hours 09/01/24 0210 09/01/24 1042   09/01/24 0045  vancomycin  (VANCOREADY) IVPB 2000 mg/400 mL        2,000 mg 200 mL/hr over 120 Minutes Intravenous  Once 09/01/24 0037 09/01/24  0344   08/31/24 2315  cefTRIAXone  (ROCEPHIN ) 2 g in sodium chloride  0.9 % 100 mL IVPB  Status:  Discontinued        2 g 200 mL/hr over 30 Minutes Intravenous Every 24 hours 08/31/24 2310 09/01/24 0210       Procedures: none  Consultants: none          Physical Exam:    09/05/2024    3:48 PM 09/05/2024    8:50 AM 09/04/2024    9:26 PM  Vitals with BMI  Systolic 90 118 116  Diastolic 56 60 86  Pulse 78 79 75    Bedside physical exam was performed by Medic listed above.  Below exam findings are based on their in person physical exam findings and my observations during virtual encounter.   General exam: awake, alert, no acute distress HEENT: wearing glasses, voice clear, hearing grossly normal  Respiratory system: CTAB, no wheezes, rales or rhonchi, normal respiratory effort. Cardiovascular system: normal S1/S2, RRR, no JVD, 1+ RLE edema, no LLE edema Central nervous system: A&O x4. no gross focal neurologic deficits, normal speech Extremities: photos below - right foot erythema appears fading and swelling improving Skin: photos below Psychiatry: normal mood, congruent affect, judgement and insight appear normal           Data Reviewed:  Notable labs --  CMP normal except glucose 198  Family Communication: wife present in the home during virtual visit  Disposition: Status is: Inpatient Remains inpatient appropriate because: remains on IV antibiotics pending further clinical improvement in the setting of 3rd recurrence of cellulitis   Planned Discharge Destination: Home    Time spent: 45 minutes  Author: Burnard DELENA Cunning, DO [facility]  04/14/2024 7:00 AM  For on call review [email or web address].   "

## 2024-09-05 NOTE — Plan of Care (Signed)
" °  Problem: Education: Goal: Knowledge of General Education information will improve Description: Including pain rating scale, medication(s)/side effects and non-pharmacologic comfort measures Outcome: Progressing   Problem: Health Behavior/Discharge Planning: Goal: Ability to manage health-related needs will improve Outcome: Progressing   Problem: Clinical Measurements: Goal: Ability to maintain clinical measurements within normal limits will improve Outcome: Progressing   Problem: Pain Managment: Goal: General experience of comfort will improve and/or be controlled Outcome: Progressing   Problem: Safety: Goal: Ability to remain free from injury will improve Outcome: Progressing   Problem: Clinical Measurements: Goal: Ability to avoid or minimize complications of infection will improve Outcome: Progressing   Problem: Skin Integrity: Goal: Skin integrity will improve Outcome: Progressing   "

## 2024-09-05 NOTE — Progress Notes (Addendum)
 Pt qualifies for DME (Durable Medical Equipment) rolling walker.  DME ordered through [employer].  Jermaine, liaison of [employer] notified to deliver DME to pt room prior to D/C home.  DME agency information placed on After Visit Summary (AVS) for pt to refer to with any DME questions after discharge.    Durable Medical Equipment  (From admission, onward)           Start     Ordered   09/04/24 0826  For home use only DME Walker rolling  Once       Question Answer Comment  Walker: With 5 Inch Wheels   Patient needs a walker to treat with the following condition Weakness      09/04/24 0826              09/05/24 0903  TOC Barriers to Discharge  Barriers to Discharge Continued Medical Work up (IV abx, med adj)

## 2024-09-06 MED ORDER — SACUBITRIL-VALSARTAN 49-51 MG PO TABS
1.0000 | ORAL_TABLET | Freq: Two times a day (BID) | ORAL | Status: DC
  Filled 2024-09-06 (×3): qty 1

## 2024-09-06 MED ORDER — SACUBITRIL-VALSARTAN 49-51 MG PO TABS: 1.0000 | ORAL_TABLET | Freq: Two times a day (BID) | ORAL | 1 refills | Status: AC

## 2024-09-06 MED ORDER — METOPROLOL SUCCINATE ER 50 MG PO TB24: 25.0000 mg | ORAL_TABLET | Freq: Every day | ORAL | Status: AC

## 2024-09-06 MED ORDER — TRESIBA FLEXTOUCH 100 UNIT/ML ~~LOC~~ SOPN: 18.0000 [IU] | PEN_INJECTOR | Freq: Every day | SUBCUTANEOUS | Status: AC

## 2024-09-06 MED ORDER — CEFADROXIL 500 MG PO CAPS: 1000.0000 mg | ORAL_CAPSULE | Freq: Two times a day (BID) | ORAL | 0 refills | Status: AC

## 2024-09-06 NOTE — Progress Notes (Signed)
 Communicated with patient via video tablet. Patient appears comfortable on the chair. AVS paperwork given to the patient. Discharge instructions discussed. Patients question answered to satisfaction. Patient agreeable with discharge plan.

## 2024-09-06 NOTE — Progress Notes (Signed)
 The pt was seen today for his hospital at home visit. The pt was alert and oriented x4. His skin was warm,dry and normal in color. He had a strong radial pulse and was breathing normally.   Vitals were taken and are as noted. The pt's right lower leg was noted have swelling from the knee down and was also noted to be bed but, not hot to the touch. There was however redness pass the line that was drawn on the dorsal side of his foot.   The pt's IV abx was given without incident. The virtual nurse was called to assist with the pt's other 2 night time mediations. The pt was asked if there was anything he needed assistance with tonight and he denied same. The pt and his wife were told to call back if anything were to change.

## 2024-09-06 NOTE — Discharge Summary (Signed)
 " Physician Discharge Summary   Patient: Anthony Case MRN: [id number] DOB: [birth year]  Admit date:     08/31/2024  Discharge date: 09/06/2024  Discharge Physician: Burnard DELENA Cunning   PCP: Yolande Toribio MATSU, MD  Patient identified themself as Anthony Case  DOB 1951/07/02  Medic Anthony Case present in the home during encounter and performed the physical exam and assessment. 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 *  Hospital Course: 74 y.o. male with medical history significant for chronic systolic/diastolic heart failure, moderate aortic stenosis, type 2 diabetes mellitus, recurrent pulmonary emboli, left lower extremity DVT, prior hospitalizations for right lower extremity cellulitis, who is admitted to [facility] on 08/31/2024 with severe sepsis due to right lower extremity cellulitis after presenting from home to [facility] ED complaining of right lower extremity erythema .    The patient reports 1 to 2 days of progressive right foot erythema, tenderness, increased warmth, swelling, with proximal radiation  of these features to level proximal to the right ankle.  He notes associated subjective fever, in the absence of any associated chills, fully rigors, or generalized myalgias.  Denies any associated acute focal weakness or any acute focal numbness/paresthesias involving the right lower extremity.  No recent trauma to the right lower extremity.  Denies any associated drainage from the right lower extremity, including no purulent drainage.  Significant Events: 08/31/24 - Admitted  09/03/24 - Transferred to [facility]  1/13 -- pt doing well.  Cellulitis continues to improve. Patient is medically stable for discharge and agreeable with the plan  Assessment and Plan: * Cellulitis of right lower extremity .  Cellulitis Sepsis - due to cellulitis - POA, resolved Initially met sepsis criteria with HR 100s, RR 20s, WBC 20-now resolved  Noted recurrent RLE cellulitis w/ prior history strep bacteremia assd with type 2 DM and outdoor travel 09/2023  Most recent admission 02/2024  No reported recent trauma  LE u/s and plain films grossly stable on admission  --Narrowed to cefazolin  w/ clinical improvement  --Case discussed with ID input, given recurrent 3rd episode. Recommended total 10 day course of antibiotics --Discharge on PO cefadroxil  1000 mg BID x 7 doses to complete full 10 days --Follow up with PCP.   --Consider outpatient referral to ID as needed.   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 Well controlled with A1C 5.5% 08/2024.  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   HLD (hyperlipidemia) Cont home lipitor and zetia    Chronic combined systolic and diastolic heart failure (HCC) 2D ECHO 10/2023 w/ EF 40-45% and grade 2 diastolic dysfunction  Appears euvolemic, stable. --Cont home Entresto , Farxiga , metoprolol  --Monitor volume status, daily weights --Reduced dose of Entresto  and metoprolol  due to hypotension --Follow up with Cardiology    Moderate aortic stenosis .  AKI (acute kidney injury) Cr on admission 1.59, from prior baseline 0.97 in March 2025.  Suspected pre-renal azotemia.  Entresto  and gabapentin  were  held, have been resumed.   Treated with IV fluids. AKI resolved. Cr 0.92 --Monitor renal function  Severe sepsis (HCC) Sepsis physiology has resolved. Continue antibiotics as outlined. Follow blood cultures - negative          Consultants: none Procedures performed: none  Disposition: Home Diet recommendation:  Cardiac and Carb modified diet DISCHARGE MEDICATION: Allergies as of 09/06/2024       Reactions   Ace Inhibitors Other (See Comments)   Cough..   Lisinopril Cough        Medication List     PAUSE taking these medications    Apremilast 30 MG Tabs Wait to take this until your doctor or other care provider tells you to start again. Take 30 mg by mouth in the morning and at bedtime.       STOP taking these medications    doxycycline 100 MG tablet Commonly known as: VIBRA-TABS   predniSONE 10 MG tablet Commonly known as: DELTASONE   sacubitril -valsartan  97-103  MG Commonly known as: Entresto  Replaced by: sacubitril -valsartan  49-51 MG       TAKE these medications    acetaminophen  500 MG tablet Commonly known as: TYLENOL  Take 1,000 mg by mouth every 6 (six) hours as needed for moderate pain.   atorvastatin  40 MG tablet Commonly known as: LIPITOR Take 40 mg by mouth daily.   cefadroxil  500 MG capsule Commonly known as: DURICEF Take 2 capsules (1,000 mg total) by mouth 2 (two) times daily for 7 doses. Starting this evening (09/06/24).   cholecalciferol  25 MCG (1000 UNIT) tablet Commonly known as: VITAMIN D3 Take 1,000 Units by mouth daily.   dapagliflozin  propanediol 10 MG Tabs tablet Commonly known as: FARXIGA  Take 10 mg by mouth daily.   ezetimibe  10 MG tablet Commonly known as: ZETIA  Take 1 tablet (10 mg total) by mouth daily.   Fish Oil 1000 MG Caps Take 1,000 mg by mouth daily.   folic acid  1 MG tablet Commonly known as: FOLVITE  Take 1 mg by mouth daily.   gabapentin  100 MG capsule Commonly known as: NEURONTIN  Take 100 mg by mouth in the morning.   gatifloxacin 0.5 % Soln Commonly known as: ZYMAXID Place 1 drop into the right eye 4 (four) times daily. Start taking on: September 26, 2024   glipiZIDE  5 MG 24 hr tablet  Commonly known as: GLUCOTROL  XL Take 5 mg by mouth daily.   ketorolac  0.5 % ophthalmic solution Commonly known as: ACULAR  Place 1 drop into the right eye 2 (two) times daily. Start taking on: September 26, 2024   metFORMIN  500 MG tablet Commonly known as: GLUCOPHAGE  Take 1,000 mg by mouth 2 (two) times daily with a meal.   metoprolol  succinate 50 MG 24 hr tablet Commonly known as: TOPROL -XL Take 0.5 tablets (25 mg total) by mouth daily. What changed: how much to take   multivitamin with minerals Tabs tablet Take 1 tablet by mouth daily.   Ozempic (0.25 or 0.5 MG/DOSE) 2 MG/3ML Sopn Generic drug: Semaglutide(0.25 or 0.5MG /DOS) Inject 2.5 mg into the skin every Tuesday.   prednisoLONE acetate 1  % ophthalmic suspension Commonly known as: PRED FORTE Place 1 drop into the right eye 4 (four) times daily. Start taking on: September 26, 2024   rivaroxaban  20 MG Tabs tablet Commonly known as: XARELTO  Take 20 mg by mouth in the morning.   sacubitril -valsartan  49-51 MG Commonly known as: ENTRESTO  Take 1 tablet by mouth 2 (two) times daily. Replaces: sacubitril -valsartan  97-103 MG   SUPER B COMPLEX PO Take 1 tablet by mouth in the morning.   Tresiba  FlexTouch 100 UNIT/ML FlexTouch Pen Generic drug: insulin  degludec Inject 18 Units into the skin at bedtime. What changed:  medication strength how much to take   vitamin C  1000 MG tablet Take 1,000 mg by mouth daily.        Contact information for after-discharge care     Durable Medical Equipment     [facility] (DME) .   Service: Durable Medical Equipment Contact information: [address] [place]  [address] [phone]                    Discharge Exam: Fredricka Weights   09/01/24 0131 09/02/24 0500  Weight: 124.7 kg 115.6 kg   General exam: awake, alert, no acute distress HEENT: voice clear, hearing grossly normal  Respiratory system:on room air  normal respiratory effort. Cardiovascular system: normal S1/S2, RRR   Central nervous system: A&O x3. no gross focal neurologic deficits, normal speech Extremities: right foot erythema improving, RLE edema improving - photos below Skin: RLE photos below Psychiatry: normal mood, congruent affect, judgement and insight appear normal             Condition at discharge: stable  The results of significant diagnostics from this hospitalization (including imaging, microbiology, ancillary and laboratory) are listed below for reference.   Imaging Studies: VAS US  LOWER EXTREMITY VENOUS (DVT) Result Date: 09/01/2024  Lower Venous DVT Study Patient Name:  DASANI CREAR  Date of Exam:   09/01/2024 Medical Rec #:  [id number]           Accession #:    [id number] Date of Birth: March 06, 1951           Patient Gender: M Patient Age:   74 years Exam Location[facility]  [facility] Procedure:      VAS US  LOWER EXTREMITY VENOUS (DVT) Referring Phys: JUSTIN HOWERTER --------------------------------------------------------------------------------  Indications: Swelling.  Risk Factors: None identified. Limitations: Poor ultrasound/tissue interface and patient positioning. Comparison Study: No prior studies. Performing Technologist: Cordella Collet RVT  Examination Guidelines: A complete evaluation includes B-mode imaging, spectral Doppler, color Doppler, and power Doppler as needed of all accessible portions of each vessel. Bilateral testing is considered an integral part of a complete examination. Limited examinations for  reoccurring indications may be performed as noted. The reflux portion of the exam is performed with the patient in reverse Trendelenburg.  +---------+---------------+---------+-----------+----------+--------------+ RIGHT    CompressibilityPhasicitySpontaneityPropertiesThrombus Aging +---------+---------------+---------+-----------+----------+--------------+ CFV      Full           Yes      Yes                                 +---------+---------------+---------+-----------+----------+--------------+ SFJ      Full                                                        +---------+---------------+---------+-----------+----------+--------------+ FV Prox  Full                                                        +---------+---------------+---------+-----------+----------+--------------+ FV Mid   Full                                                        +---------+---------------+---------+-----------+----------+--------------+ FV DistalFull                                                        +---------+---------------+---------+-----------+----------+--------------+ PFV       Full                                                        +---------+---------------+---------+-----------+----------+--------------+ POP      Full           Yes      Yes                                 +---------+---------------+---------+-----------+----------+--------------+ PTV      Full                                                        +---------+---------------+---------+-----------+----------+--------------+ PERO     Full                                                        +---------+---------------+---------+-----------+----------+--------------+   +---------+---------------+---------+-----------+----------+--------------+ LEFT     CompressibilityPhasicitySpontaneityPropertiesThrombus Aging +---------+---------------+---------+-----------+----------+--------------+ CFV      Full  Yes      Yes                                 +---------+---------------+---------+-----------+----------+--------------+ SFJ      Full                                                        +---------+---------------+---------+-----------+----------+--------------+ FV Prox  Full                                                        +---------+---------------+---------+-----------+----------+--------------+ FV Mid   Full                                                        +---------+---------------+---------+-----------+----------+--------------+ FV Distal               Yes      Yes                                 +---------+---------------+---------+-----------+----------+--------------+ PFV      Full                                                        +---------+---------------+---------+-----------+----------+--------------+ POP      Full           Yes      Yes                                 +---------+---------------+---------+-----------+----------+--------------+ PTV      Full                                                         +---------+---------------+---------+-----------+----------+--------------+ PERO     Full                                                        +---------+---------------+---------+-----------+----------+--------------+     Summary: RIGHT: - There is no evidence of deep vein thrombosis in the lower extremity.  - No cystic structure found in the popliteal fossa.  LEFT: - There is no evidence of deep vein thrombosis in the lower extremity. However, portions of this examination were limited- see technologist comments above.  - No cystic structure found in the popliteal fossa.  *See table(s) above for measurements and observations. Electronically  signed by Debby Robertson on 09/01/2024 at 8:16:17 PM.    Final    DG Chest Port 1 View Result Date: 09/01/2024 EXAM: 1 VIEW(S) XRAY OF THE CHEST 09/01/2024 07:32:00 AM COMPARISON: 11/10/2023 CLINICAL HISTORY: 74 year old male. Possible Sepsis FINDINGS: LUNGS AND PLEURA: No focal pulmonary opacity. No pleural effusion. No pneumothorax. HEART AND MEDIASTINUM: Mild-to-moderate atherosclerotic calcifications in aortic arch. No acute abnormality of the cardiac and mediastinal silhouettes. BONES AND SOFT TISSUES: Moderate multilevel degenerative disc changes of thoracic spine. Chronic deformity distal right clavicle. Partial visualization of lower thoracic spine posterior rod and screw fixation hardware. IMPRESSION: 1. No acute cardiopulmonary abnormality. Electronically signed by: Helayne Hurst MD MD 09/01/2024 07:36 AM EST RP Workstation: [id number]   DG Foot Complete Right Result Date: 08/31/2024 EXAM: 3 VIEW(S) XRAY OF THE RIGHT FOOT 08/31/2024 09:05:00 PM COMPARISON: Right foot series 11/11/2023. CLINICAL HISTORY: infection infection FINDINGS: BONES AND JOINTS: No acute fracture. No findings of acute osteomyelitis. No new osseous abnormality is seen. There is a chronic avulsion fracture or accessory ossicle again noted along the base of the 5th  metatarsal and an elongate os peroneum also again shown. There is hallux valgus with moderate arthrosis at the 1st MTP joint, spurring along the medial 1st metatarsal head, degenerative features of the toe joints and midfoot, large plantar and moderate dorsal calcaneal spurs. SOFT TISSUES: There are vascular calcifications in the distal foreleg and foot. Increased mild to moderate soft tissue edema is seen greatest in the forefoot. No soft tissue gas is seen. IMPRESSION: 1. Increased mild to moderate soft tissue edema, greatest in the forefoot. No soft tissue gas or radiographic evidence of acute osteomyelitis. 2. No acute osseous abnormality. Electronically signed by: Francis Quam MD 08/31/2024 09:18 PM EST RP Workstation: [id number]    Microbiology: Results for orders placed or performed during the hospital encounter of 08/31/24  Culture, blood (routine x 2)     Status: None   Collection Time: 08/31/24  8:30 PM   Specimen: BLOOD RIGHT ARM  Result Value Ref Range Status   Specimen Description BLOOD RIGHT ARM  Final   Special Requests   Final    BOTTLES DRAWN AEROBIC AND ANAEROBIC Blood Culture adequate volume   Culture   Final    NO GROWTH 5 DAYS Performed at [facility], [address] [place], [place] [address]    Report Status 09/05/2024 FINAL  Final  Culture, blood (routine x 2)     Status: None   Collection Time: 08/31/24  8:36 PM   Specimen: BLOOD LEFT ARM  Result Value Ref Range Status   Specimen Description BLOOD LEFT ARM  Final   Special Requests   Final    BOTTLES DRAWN AEROBIC AND ANAEROBIC Blood Culture adequate volume   Culture   Final    NO GROWTH 5 DAYS Performed at [facility], [address] [place], [place] [address]    Report Status 09/05/2024 FINAL  Final    Labs: CBC: Recent Labs  Lab 09/04/24 1118  WBC 6.9  HGB 14.0  HCT 41.1  MCV 89.2  PLT 246   Basic Metabolic Panel: Recent Labs  Lab 09/04/24 1118 09/05/24 1008  NA 137 140  K  4.4 4.6  CL 107 109  CO2 21* 22  GLUCOSE 215* 198*  BUN 22 21  CREATININE 0.94 0.92  CALCIUM  9.5 9.6   Liver Function Tests: Recent Labs  Lab 09/04/24 1118  AST 45*  ALT 44  ALKPHOS 67  BILITOT 0.6  PROT 6.2*  ALBUMIN 3.1*   CBG: Recent Labs  Lab 09/03/24 1205  GLUCAP 134*    Discharge time spent: greater than 30 minutes.  Signed: Burnard DELENA Cunning, DO [facility] 09/10/2024 "

## 2024-09-06 NOTE — Progress Notes (Signed)
 H@H  medic out to see patient this morning for first visit of the day. On arrival, I was welcomed in by the patient. Vital signs were updated. Blood pressures were soft this visit. Virtual RN and provider were made aware. Pt denies dizziness, feeling lightheaded, chest pain, or shob. Metoprolol  and Entresto  were held due to blood pressure. All other medications were given with RN oversight. A video call with the RN and provider was conducted and completed. No labs were needed today. The [facility] will be discharged from [facility] today. No other tasks were needed at this time. Pt was left in care of himself. H@H  visit completed.

## 2024-09-06 NOTE — Progress Notes (Signed)
 Patient's manual BP 98/66. Patient asymptomatic. Per Dr Fausto, hold Entresto  and Toprol -XL this morning.

## 2024-10-03 ENCOUNTER — Ambulatory Visit: Admitting: Cardiology

## 2024-10-24 ENCOUNTER — Ambulatory Visit: Admitting: Podiatry

## 2024-11-08 ENCOUNTER — Ambulatory Visit: Admitting: Neurology
